# Patient Record
Sex: Male | Born: 1935 | Race: White | Hispanic: No | Marital: Married | Smoking: Never smoker
Health system: Southern US, Community
[De-identification: ages and names within clinical notes are randomized; demographics above are authoritative.]

## PROBLEM LIST (undated history)

## (undated) ENCOUNTER — Inpatient Hospital Stay: Admission: EM | Payer: Self-pay | Source: Home / Self Care

## (undated) DIAGNOSIS — R011 Cardiac murmur, unspecified: Secondary | ICD-10-CM

## (undated) DIAGNOSIS — D689 Coagulation defect, unspecified: Secondary | ICD-10-CM

## (undated) DIAGNOSIS — C4490 Unspecified malignant neoplasm of skin, unspecified: Secondary | ICD-10-CM

## (undated) DIAGNOSIS — N189 Chronic kidney disease, unspecified: Secondary | ICD-10-CM

## (undated) DIAGNOSIS — F0390 Unspecified dementia without behavioral disturbance: Secondary | ICD-10-CM

## (undated) HISTORY — DX: Unspecified malignant neoplasm of skin, unspecified: C44.90

## (undated) HISTORY — DX: Chronic kidney disease, unspecified: N18.9

## (undated) HISTORY — PX: EYE SURGERY: SHX253

## (undated) HISTORY — PX: MELANOMA EXCISION: SHX5266

## (undated) HISTORY — PX: KNEE SURGERY: SHX244

## (undated) HISTORY — DX: Cardiac murmur, unspecified: R01.1

## (undated) HISTORY — DX: Coagulation defect, unspecified: D68.9

---

## 2010-06-14 ENCOUNTER — Ambulatory Visit: Payer: Self-pay | Admitting: Cardiovascular Disease

## 2010-06-14 ENCOUNTER — Inpatient Hospital Stay: Payer: Self-pay | Admitting: Internal Medicine

## 2010-06-30 ENCOUNTER — Ambulatory Visit: Payer: Self-pay | Admitting: Internal Medicine

## 2010-07-28 ENCOUNTER — Ambulatory Visit: Payer: Self-pay | Admitting: Internal Medicine

## 2010-07-30 LAB — CEA: CEA: 2.1 ng/mL (ref 0.0–4.7)

## 2010-07-30 LAB — PSA

## 2010-07-31 ENCOUNTER — Ambulatory Visit: Payer: Self-pay | Admitting: Internal Medicine

## 2010-07-31 LAB — PROT IMMUNOELECTROPHORES(ARMC)

## 2010-08-31 ENCOUNTER — Ambulatory Visit: Payer: Self-pay | Admitting: Internal Medicine

## 2010-09-07 ENCOUNTER — Ambulatory Visit: Payer: Self-pay | Admitting: Family Medicine

## 2010-09-30 ENCOUNTER — Ambulatory Visit: Payer: Self-pay | Admitting: Internal Medicine

## 2010-10-31 ENCOUNTER — Ambulatory Visit: Payer: Self-pay | Admitting: Internal Medicine

## 2010-11-30 ENCOUNTER — Ambulatory Visit: Payer: Self-pay | Admitting: Internal Medicine

## 2010-12-26 ENCOUNTER — Ambulatory Visit: Payer: Self-pay | Admitting: Gastroenterology

## 2010-12-31 ENCOUNTER — Ambulatory Visit: Payer: Self-pay | Admitting: Internal Medicine

## 2011-01-31 ENCOUNTER — Ambulatory Visit: Payer: Self-pay | Admitting: Internal Medicine

## 2011-04-12 ENCOUNTER — Ambulatory Visit: Payer: Self-pay | Admitting: Internal Medicine

## 2011-04-28 ENCOUNTER — Emergency Department: Payer: Self-pay | Admitting: Internal Medicine

## 2011-05-03 ENCOUNTER — Ambulatory Visit: Payer: Self-pay | Admitting: Internal Medicine

## 2011-05-25 ENCOUNTER — Encounter: Payer: Self-pay | Admitting: Cardiothoracic Surgery

## 2011-06-01 ENCOUNTER — Encounter: Payer: Self-pay | Admitting: Cardiothoracic Surgery

## 2011-06-01 ENCOUNTER — Ambulatory Visit: Payer: Self-pay | Admitting: Internal Medicine

## 2011-07-01 ENCOUNTER — Ambulatory Visit: Payer: Self-pay | Admitting: Internal Medicine

## 2011-07-01 ENCOUNTER — Encounter: Payer: Self-pay | Admitting: Cardiothoracic Surgery

## 2011-08-01 ENCOUNTER — Ambulatory Visit: Payer: Self-pay | Admitting: Internal Medicine

## 2011-08-01 ENCOUNTER — Encounter: Payer: Self-pay | Admitting: Cardiothoracic Surgery

## 2011-08-01 ENCOUNTER — Encounter: Payer: Self-pay | Admitting: Nurse Practitioner

## 2011-09-01 ENCOUNTER — Ambulatory Visit: Payer: Self-pay | Admitting: Internal Medicine

## 2011-09-01 ENCOUNTER — Encounter: Payer: Self-pay | Admitting: Cardiothoracic Surgery

## 2011-09-01 ENCOUNTER — Encounter: Payer: Self-pay | Admitting: Nurse Practitioner

## 2011-09-19 ENCOUNTER — Ambulatory Visit: Payer: Self-pay | Admitting: Otolaryngology

## 2011-10-01 ENCOUNTER — Ambulatory Visit: Payer: Self-pay | Admitting: Internal Medicine

## 2011-11-01 ENCOUNTER — Ambulatory Visit: Payer: Self-pay | Admitting: Internal Medicine

## 2011-12-01 ENCOUNTER — Ambulatory Visit: Payer: Self-pay | Admitting: Internal Medicine

## 2012-01-01 ENCOUNTER — Ambulatory Visit: Payer: Self-pay | Admitting: Internal Medicine

## 2012-04-07 ENCOUNTER — Ambulatory Visit: Payer: Self-pay | Admitting: Internal Medicine

## 2012-04-07 LAB — PROTIME-INR
INR: 2.7
Prothrombin Time: 28.6 secs — ABNORMAL HIGH (ref 11.5–14.7)

## 2012-04-14 LAB — PROTIME-INR
INR: 2.2
Prothrombin Time: 24.9 secs — ABNORMAL HIGH (ref 11.5–14.7)

## 2012-04-30 ENCOUNTER — Ambulatory Visit: Payer: Self-pay | Admitting: Internal Medicine

## 2012-04-30 LAB — CBC CANCER CENTER
Basophil %: 0.5 %
Eosinophil #: 0.1 x10 3/mm (ref 0.0–0.7)
Lymphocyte #: 1.4 x10 3/mm (ref 1.0–3.6)
MCV: 98 fL (ref 80–100)
Monocyte %: 10.8 %
Neutrophil #: 4.3 x10 3/mm (ref 1.4–6.5)
Neutrophil %: 65.4 %
Platelet: 192 x10 3/mm (ref 150–440)
RBC: 4.17 10*6/uL — ABNORMAL LOW (ref 4.40–5.90)
RDW: 13.7 % (ref 11.5–14.5)

## 2012-05-05 LAB — PROTIME-INR
INR: 2.3
Prothrombin Time: 25.3 secs — ABNORMAL HIGH (ref 11.5–14.7)

## 2012-05-14 LAB — CBC CANCER CENTER
Basophil #: 0 x10 3/mm (ref 0.0–0.1)
Eosinophil #: 0.1 x10 3/mm (ref 0.0–0.7)
Eosinophil %: 1.1 %
MCH: 32.7 pg (ref 26.0–34.0)
MCHC: 33.7 g/dL (ref 32.0–36.0)
MCV: 97 fL (ref 80–100)
Neutrophil #: 4.8 x10 3/mm (ref 1.4–6.5)
RBC: 4.38 10*6/uL — ABNORMAL LOW (ref 4.40–5.90)
RDW: 13.9 % (ref 11.5–14.5)
WBC: 7.2 x10 3/mm (ref 3.8–10.6)

## 2012-05-21 LAB — CBC CANCER CENTER
Basophil %: 0.7 %
Eosinophil #: 0.2 x10 3/mm (ref 0.0–0.7)
Eosinophil %: 2.7 %
HCT: 42.6 % (ref 40.0–52.0)
Lymphocyte #: 1.8 x10 3/mm (ref 1.0–3.6)
Lymphocyte %: 29.4 %
MCHC: 33.2 g/dL (ref 32.0–36.0)
MCV: 97 fL (ref 80–100)
Neutrophil %: 56.9 %
RBC: 4.38 10*6/uL — ABNORMAL LOW (ref 4.40–5.90)
RDW: 13.7 % (ref 11.5–14.5)

## 2012-05-31 ENCOUNTER — Ambulatory Visit: Payer: Self-pay | Admitting: Internal Medicine

## 2012-06-02 LAB — PROTIME-INR: INR: 1.9

## 2012-06-30 ENCOUNTER — Ambulatory Visit: Payer: Self-pay | Admitting: Internal Medicine

## 2012-06-30 LAB — PROTIME-INR
INR: 2.4
Prothrombin Time: 26.2 secs — ABNORMAL HIGH (ref 11.5–14.7)

## 2012-07-31 ENCOUNTER — Ambulatory Visit: Payer: Self-pay | Admitting: Internal Medicine

## 2012-08-01 LAB — PROTIME-INR: INR: 2

## 2012-08-31 ENCOUNTER — Ambulatory Visit: Payer: Self-pay | Admitting: Internal Medicine

## 2012-09-05 LAB — PROTIME-INR: Prothrombin Time: 25.3 secs — ABNORMAL HIGH (ref 11.5–14.7)

## 2012-09-30 ENCOUNTER — Ambulatory Visit: Payer: Self-pay | Admitting: Internal Medicine

## 2012-10-06 LAB — PROTIME-INR: Prothrombin Time: 23.3 secs — ABNORMAL HIGH (ref 11.5–14.7)

## 2012-10-31 ENCOUNTER — Ambulatory Visit: Payer: Self-pay | Admitting: Internal Medicine

## 2012-11-03 LAB — PROTIME-INR: INR: 2.1

## 2012-11-30 ENCOUNTER — Ambulatory Visit: Payer: Self-pay | Admitting: Internal Medicine

## 2012-12-01 LAB — PROTIME-INR: INR: 2.5

## 2012-12-29 LAB — PROTIME-INR: INR: 2.6

## 2012-12-31 ENCOUNTER — Ambulatory Visit: Payer: Self-pay | Admitting: Internal Medicine

## 2013-03-31 ENCOUNTER — Ambulatory Visit: Payer: Self-pay | Admitting: Internal Medicine

## 2013-04-16 LAB — PROTIME-INR
INR: 2
Prothrombin Time: 22.2 secs — ABNORMAL HIGH (ref 11.5–14.7)

## 2013-04-24 LAB — PROTIME-INR: INR: 2

## 2013-04-30 ENCOUNTER — Ambulatory Visit: Payer: Self-pay | Admitting: Internal Medicine

## 2013-05-31 ENCOUNTER — Ambulatory Visit: Payer: Self-pay | Admitting: Internal Medicine

## 2013-06-19 LAB — PROTIME-INR: Prothrombin Time: 28.1 secs — ABNORMAL HIGH (ref 11.5–14.7)

## 2013-06-30 ENCOUNTER — Ambulatory Visit: Payer: Self-pay | Admitting: Internal Medicine

## 2013-07-10 LAB — PROTIME-INR
INR: 2.6
Prothrombin Time: 27.5 secs — ABNORMAL HIGH (ref 11.5–14.7)

## 2013-07-31 ENCOUNTER — Ambulatory Visit: Payer: Self-pay | Admitting: Internal Medicine

## 2013-08-10 LAB — PROTIME-INR: Prothrombin Time: 25.6 secs — ABNORMAL HIGH (ref 11.5–14.7)

## 2013-08-31 ENCOUNTER — Ambulatory Visit: Payer: Self-pay | Admitting: Internal Medicine

## 2013-08-31 ENCOUNTER — Ambulatory Visit: Payer: Self-pay | Admitting: Oncology

## 2013-09-16 LAB — PROTIME-INR: Prothrombin Time: 26.7 secs — ABNORMAL HIGH (ref 11.5–14.7)

## 2013-09-30 ENCOUNTER — Ambulatory Visit: Payer: Self-pay | Admitting: Oncology

## 2013-09-30 ENCOUNTER — Ambulatory Visit: Payer: Self-pay | Admitting: Internal Medicine

## 2013-10-14 LAB — PROTIME-INR
INR: 2.4
Prothrombin Time: 25.3 secs — ABNORMAL HIGH (ref 11.5–14.7)

## 2013-10-31 ENCOUNTER — Ambulatory Visit: Payer: Self-pay | Admitting: Internal Medicine

## 2013-10-31 ENCOUNTER — Ambulatory Visit: Payer: Self-pay | Admitting: Family Medicine

## 2013-11-18 LAB — PROTIME-INR: INR: 3

## 2013-11-23 LAB — PROTIME-INR: Prothrombin Time: 28.2 secs — ABNORMAL HIGH (ref 11.5–14.7)

## 2013-11-30 ENCOUNTER — Ambulatory Visit: Payer: Self-pay | Admitting: Internal Medicine

## 2013-12-16 LAB — PROTIME-INR
INR: 2.9
Prothrombin Time: 29.1 s — ABNORMAL HIGH

## 2013-12-31 ENCOUNTER — Ambulatory Visit: Payer: Self-pay | Admitting: Family Medicine

## 2013-12-31 ENCOUNTER — Ambulatory Visit: Payer: Self-pay | Admitting: Internal Medicine

## 2014-04-14 ENCOUNTER — Ambulatory Visit: Payer: Self-pay | Admitting: Internal Medicine

## 2014-04-14 LAB — PROTIME-INR
INR: 2.6
PROTHROMBIN TIME: 26.8 s — AB (ref 11.5–14.7)

## 2014-04-30 ENCOUNTER — Ambulatory Visit: Payer: Self-pay | Admitting: Internal Medicine

## 2014-05-01 ENCOUNTER — Observation Stay: Payer: Self-pay | Admitting: Internal Medicine

## 2014-05-01 ENCOUNTER — Ambulatory Visit: Payer: Self-pay | Admitting: Student

## 2014-05-01 LAB — APTT: ACTIVATED PTT: 32 s (ref 23.6–35.9)

## 2014-05-01 LAB — BASIC METABOLIC PANEL
Anion Gap: 9 (ref 7–16)
BUN: 21 mg/dL — ABNORMAL HIGH (ref 7–18)
CALCIUM: 9.2 mg/dL (ref 8.5–10.1)
Chloride: 110 mmol/L — ABNORMAL HIGH (ref 98–107)
Co2: 23 mmol/L (ref 21–32)
Creatinine: 2.17 mg/dL — ABNORMAL HIGH (ref 0.60–1.30)
EGFR (African American): 32 — ABNORMAL LOW
EGFR (Non-African Amer.): 28 — ABNORMAL LOW
Glucose: 138 mg/dL — ABNORMAL HIGH (ref 65–99)
OSMOLALITY: 288 (ref 275–301)
Potassium: 4.1 mmol/L (ref 3.5–5.1)
Sodium: 142 mmol/L (ref 136–145)

## 2014-05-01 LAB — CBC
HCT: 40.6 % (ref 40.0–52.0)
HGB: 13.3 g/dL (ref 13.0–18.0)
MCH: 32.1 pg (ref 26.0–34.0)
MCHC: 32.7 g/dL (ref 32.0–36.0)
MCV: 98 fL (ref 80–100)
Platelet: 222 10*3/uL (ref 150–440)
RBC: 4.13 10*6/uL — ABNORMAL LOW (ref 4.40–5.90)
RDW: 14.4 % (ref 11.5–14.5)
WBC: 12.3 10*3/uL — ABNORMAL HIGH (ref 3.8–10.6)

## 2014-05-01 LAB — PROTIME-INR
INR: 2.3
PROTHROMBIN TIME: 24.3 s — AB (ref 11.5–14.7)

## 2014-05-02 LAB — BASIC METABOLIC PANEL
ANION GAP: 6 — AB (ref 7–16)
BUN: 22 mg/dL — ABNORMAL HIGH (ref 7–18)
CALCIUM: 8.3 mg/dL — AB (ref 8.5–10.1)
CHLORIDE: 112 mmol/L — AB (ref 98–107)
CO2: 23 mmol/L (ref 21–32)
Creatinine: 1.78 mg/dL — ABNORMAL HIGH (ref 0.60–1.30)
EGFR (African American): 41 — ABNORMAL LOW
GFR CALC NON AF AMER: 35 — AB
Glucose: 140 mg/dL — ABNORMAL HIGH (ref 65–99)
OSMOLALITY: 287 (ref 275–301)
Potassium: 4.9 mmol/L (ref 3.5–5.1)
Sodium: 141 mmol/L (ref 136–145)

## 2014-05-02 LAB — CBC WITH DIFFERENTIAL/PLATELET
BASOS ABS: 0 10*3/uL (ref 0.0–0.1)
Basophil %: 0.2 %
EOS PCT: 0.1 %
Eosinophil #: 0 10*3/uL (ref 0.0–0.7)
HCT: 32.7 % — AB (ref 40.0–52.0)
HGB: 10.8 g/dL — AB (ref 13.0–18.0)
LYMPHS ABS: 1.1 10*3/uL (ref 1.0–3.6)
LYMPHS PCT: 11.6 %
MCH: 32.3 pg (ref 26.0–34.0)
MCHC: 33 g/dL (ref 32.0–36.0)
MCV: 98 fL (ref 80–100)
Monocyte #: 0.9 x10 3/mm (ref 0.2–1.0)
Monocyte %: 9.5 %
NEUTROS PCT: 78.6 %
Neutrophil #: 7.4 10*3/uL — ABNORMAL HIGH (ref 1.4–6.5)
Platelet: 175 10*3/uL (ref 150–440)
RBC: 3.33 10*6/uL — ABNORMAL LOW (ref 4.40–5.90)
RDW: 14.6 % — ABNORMAL HIGH (ref 11.5–14.5)
WBC: 9.4 10*3/uL (ref 3.8–10.6)

## 2014-05-02 LAB — PROTIME-INR
INR: 2.1
INR: 1.4
PROTHROMBIN TIME: 22.7 s — AB (ref 11.5–14.7)
Prothrombin Time: 17 secs — ABNORMAL HIGH (ref 11.5–14.7)

## 2014-05-02 LAB — CK-MB
CK-MB: 1.7 ng/mL (ref 0.5–3.6)
CK-MB: 1.1 ng/mL (ref 0.5–3.6)
CK-MB: 1 ng/mL (ref 0.5–3.6)
CK-MB: 1.3 ng/mL (ref 0.5–3.6)

## 2014-05-02 LAB — TROPONIN I
Troponin-I: <0.02 ng/mL
Troponin-I: <0.02 ng/mL
Troponin-I: <0.02 ng/mL

## 2014-05-03 LAB — BASIC METABOLIC PANEL
Anion Gap: 3 — ABNORMAL LOW (ref 7–16)
BUN: 15 mg/dL (ref 7–18)
CHLORIDE: 115 mmol/L — AB (ref 98–107)
CO2: 26 mmol/L (ref 21–32)
Calcium, Total: 7.8 mg/dL — ABNORMAL LOW (ref 8.5–10.1)
Creatinine: 1.71 mg/dL — ABNORMAL HIGH (ref 0.60–1.30)
EGFR (Non-African Amer.): 37 — ABNORMAL LOW
GFR CALC AF AMER: 43 — AB
Glucose: 96 mg/dL (ref 65–99)
OSMOLALITY: 288 (ref 275–301)
POTASSIUM: 4.2 mmol/L (ref 3.5–5.1)
SODIUM: 144 mmol/L (ref 136–145)

## 2014-05-03 LAB — CBC WITH DIFFERENTIAL/PLATELET
Basophil #: 0.1 10*3/uL (ref 0.0–0.1)
Basophil %: 0.7 %
EOS ABS: 0.1 10*3/uL (ref 0.0–0.7)
Eosinophil %: 1.2 %
HCT: 27.2 % — ABNORMAL LOW (ref 40.0–52.0)
HGB: 9 g/dL — ABNORMAL LOW (ref 13.0–18.0)
LYMPHS ABS: 1.3 10*3/uL (ref 1.0–3.6)
Lymphocyte %: 15.3 %
MCH: 32.5 pg (ref 26.0–34.0)
MCHC: 33.1 g/dL (ref 32.0–36.0)
MCV: 98 fL (ref 80–100)
Monocyte #: 1 x10 3/mm (ref 0.2–1.0)
Monocyte %: 11.7 %
NEUTROS ABS: 6.3 10*3/uL (ref 1.4–6.5)
Neutrophil %: 71.1 %
PLATELETS: 147 10*3/uL — AB (ref 150–440)
RBC: 2.77 10*6/uL — ABNORMAL LOW (ref 4.40–5.90)
RDW: 14.7 % — ABNORMAL HIGH (ref 11.5–14.5)
WBC: 8.8 10*3/uL (ref 3.8–10.6)

## 2014-05-12 LAB — CANCER CENTER HEMOGLOBIN: HGB: 10.5 g/dL — AB (ref 13.0–18.0)

## 2014-05-12 LAB — CANCER CTR PLATELET CT: PLATELETS: 364 x10 3/mm (ref 150–440)

## 2014-05-13 DIAGNOSIS — S8010XA Contusion of unspecified lower leg, initial encounter: Secondary | ICD-10-CM | POA: Insufficient documentation

## 2014-05-21 LAB — PROTIME-INR
INR: 1.6
PROTHROMBIN TIME: 18.3 s — AB (ref 11.5–14.7)

## 2014-05-21 LAB — CANCER CTR PLATELET CT: PLATELETS: 425 x10 3/mm (ref 150–440)

## 2014-05-21 LAB — CANCER CENTER HEMOGLOBIN: HGB: 12.1 g/dL — ABNORMAL LOW (ref 13.0–18.0)

## 2014-05-25 LAB — PROTIME-INR
INR: 2.2
PROTHROMBIN TIME: 23.5 s — AB (ref 11.5–14.7)

## 2014-05-25 LAB — CANCER CTR PLATELET CT: Platelet: 428 x10 3/mm (ref 150–440)

## 2014-05-26 DIAGNOSIS — T148XXA Other injury of unspecified body region, initial encounter: Secondary | ICD-10-CM | POA: Insufficient documentation

## 2014-05-31 ENCOUNTER — Ambulatory Visit: Payer: Self-pay | Admitting: Internal Medicine

## 2014-05-31 LAB — CANCER CTR PLATELET CT: Platelet: 392 x10 3/mm (ref 150–440)

## 2014-05-31 LAB — PROTIME-INR
INR: 2.4
PROTHROMBIN TIME: 25.6 s — AB (ref 11.5–14.7)

## 2014-06-03 DIAGNOSIS — T8149XA Infection following a procedure, other surgical site, initial encounter: Secondary | ICD-10-CM | POA: Insufficient documentation

## 2014-06-03 DIAGNOSIS — S8000XA Contusion of unspecified knee, initial encounter: Secondary | ICD-10-CM | POA: Insufficient documentation

## 2014-06-07 LAB — PROTIME-INR
INR: 3
Prothrombin Time: 30.6 secs — ABNORMAL HIGH (ref 11.5–14.7)

## 2014-06-10 LAB — PROTIME-INR
INR: 2.8
PROTHROMBIN TIME: 29.1 s — AB (ref 11.5–14.7)

## 2014-06-14 ENCOUNTER — Encounter: Payer: Self-pay | Admitting: Surgery

## 2014-06-24 LAB — PROTIME-INR
INR: 3
Prothrombin Time: 30.1 secs — ABNORMAL HIGH (ref 11.5–14.7)

## 2014-06-30 ENCOUNTER — Ambulatory Visit: Payer: Self-pay | Admitting: Internal Medicine

## 2014-06-30 ENCOUNTER — Encounter: Payer: Self-pay | Admitting: Surgery

## 2014-07-22 LAB — PROTIME-INR
INR: 2.6
Prothrombin Time: 27.3 secs — ABNORMAL HIGH (ref 11.5–14.7)

## 2014-07-31 ENCOUNTER — Encounter: Payer: Self-pay | Admitting: Surgery

## 2014-07-31 ENCOUNTER — Ambulatory Visit: Payer: Self-pay | Admitting: Internal Medicine

## 2014-08-05 LAB — PROTIME-INR
INR: 2.3
Prothrombin Time: 24.6 secs — ABNORMAL HIGH (ref 11.5–14.7)

## 2014-08-05 LAB — WOUND AEROBIC CULTURE

## 2014-08-19 LAB — PROTIME-INR
INR: 2.5
Prothrombin Time: 26.2 secs — ABNORMAL HIGH (ref 11.5–14.7)

## 2014-08-31 ENCOUNTER — Encounter: Payer: Self-pay | Admitting: Surgery

## 2014-08-31 ENCOUNTER — Ambulatory Visit: Payer: Self-pay | Admitting: Internal Medicine

## 2014-09-16 LAB — PROTIME-INR
INR: 2.3
Prothrombin Time: 24.5 secs — ABNORMAL HIGH (ref 11.5–14.7)

## 2014-09-30 ENCOUNTER — Ambulatory Visit: Payer: Self-pay | Admitting: Internal Medicine

## 2014-10-14 LAB — PROTIME-INR
INR: 2.5
PROTHROMBIN TIME: 26.2 s — AB (ref 11.5–14.7)

## 2014-10-31 ENCOUNTER — Ambulatory Visit: Payer: Self-pay | Admitting: Internal Medicine

## 2014-11-11 LAB — PROTIME-INR
INR: 2
Prothrombin Time: 21.9 secs — ABNORMAL HIGH (ref 11.5–14.7)

## 2014-11-30 ENCOUNTER — Ambulatory Visit: Payer: Self-pay | Admitting: Internal Medicine

## 2014-12-09 LAB — PROTIME-INR
INR: 2.2
PROTHROMBIN TIME: 23.8 s — AB (ref 11.5–14.7)

## 2014-12-29 LAB — PROTIME-INR
INR: 2.3
Prothrombin Time: 24.4 secs — ABNORMAL HIGH (ref 11.5–14.7)

## 2014-12-31 ENCOUNTER — Ambulatory Visit: Payer: Self-pay | Admitting: Internal Medicine

## 2015-04-23 NOTE — Consult Note (Signed)
Admit Diagnosis:   HEMATOMA OF LEG: Onset Date: 01-May-2014, Status: Active, Description: HEMATOMA OF LEG    denies:   Home Medications: Medication Instructions Status  Coumadin 2.5 mg oral tablet 3 tab(s) orally once a day, #300 Active  amlodipine 10 mg oral tablet 1 tab(s) orally once a day 5 PM Active  enalapril 20 mg oral tablet 1 tab(s) orally once a day 5 PM Active  grape seed supplement 1 daily AM Active  Vitamin B12 1500 mcg 1 tab(s) sublingual every other day Active  Mucinex Children's Cold, Cough & Sore Throat 325 mg-10 mg-200 mg-5 mg/10 mL oral liquid  orally , As Needed for cough and congestion Active   Lab Results: Routine BB:  02-May-15 20:50   ABO Group + Rh Type O Positive  Antibody Screen NEGATIVE (Result(s) reported on 01 May 2014 at 09:53PM.)  Routine Chem:  02-May-15 20:50   Glucose, Serum  138  BUN  21  Creatinine (comp)  2.17  Sodium, Serum 142  Potassium, Serum 4.1  Chloride, Serum  110  CO2, Serum 23  Calcium (Total), Serum 9.2  Anion Gap 9  Osmolality (calc) 288  eGFR (African American)  32  eGFR (Non-African American)  28 (eGFR values <39m/min/1.73 m2 may be an indication of chronic kidney disease (CKD). Calculated eGFR is useful in patients with stable renal function. The eGFR calculation will not be reliable in acutely ill patients when serum creatinine is changing rapidly. It is not useful in  patients on dialysis. The eGFR calculation may not be applicable to patients at the low and high extremes of body sizes, pregnant women, and vegetarians.)  Routine Coag:  02-May-15 20:50   Prothrombin  24.3  INR 2.3 (INR reference interval applies to patients on anticoagulant therapy. A single INR therapeutic range for coumarins is not optimal for all indications; however, the suggested range for most indications is 2.0 - 3.0. Exceptions to the INR Reference Range may include: Prosthetic heart valves, acute myocardial infarction, prevention of  myocardial infarction, and combinations of aspirin and anticoagulant. The need for a higher or lower target INR must be assessed individually. Reference: The Pharmacology and Management of the Vitamin K  antagonists: the seventh ACCP Conference on Antithrombotic and Thrombolytic Therapy. CHTDSK.8768Sept:126 (3suppl): [id number] A HCT value >55% may artifactually increase the PT.  In one study,  the increase was an average of 25%. Reference:  "Effect on Routine and Special Coagulation Testing Values of Citrate Anticoagulant Adjustment in Patients with High HCT Values." American Journal of Clinical Pathology 2006;126:400-405.)  Activated PTT (APTT) 32.0 (A HCT value >55% may artifactually increase the APTT. In one study, the increase was an average of 19%. Reference: "Effect on Routine and Special Coagulation Testing Values of Citrate Anticoagulant Adjustment in Patients with High HCT Values." American Journal of Clinical Pathology 2006;126:400-405.)  Routine Hem:  02-May-15 20:50   WBC (CBC)  12.3  RBC (CBC)  4.13  Hemoglobin (CBC) 13.3  Hematocrit (CBC) 40.6  Platelet Count (CBC) 222 (Result(s) reported on 01 May 2014 at 09:03PM.)  MCV 98  MCH 32.1  MCHC 32.7  RDW 14.4   Radiology Results:  Radiology Results: XRay:    02-May-15 20:03, Femur Right  Femur Right  REASON FOR EXAM:    sliding into base  COMMENTS:       PROCEDURE: DXR - DXR FEMUR RIGHT  - May 01 2014  8:03PM     CLINICAL DATA:  Pain and swelling.    EXAM:  RIGHT FEMUR - 2 VIEW    COMPARISON:  None.    FINDINGS:  Diffuse osteopenia and degenerative change. No acute bony or joint  abnormality. Peripheral vascular disease .   IMPRESSION:  1.  No acute bony abnormality.    2. Peripheral vascular disease.      Electronically Signed    By: Marcello Moores  Register    On: 05/01/2014 20:14         Verified By: Osa Craver, M.D., MD    [phone] 20:03, Knee Right Complete  Knee Right Complete  REASON  FOR EXAM:    sliding into base, pain, swelling  COMMENTS:       PROCEDURE: DXR - DXR KNEE RT COMP WITH OBLIQUES  - May 01 2014  8:03PM     CLINICAL DATA:  Pain.  Injury.    EXAM:  RIGHT KNEE - COMPLETE 4+ VIEW    COMPARISON:  None.    FINDINGS:  Diffuse soft tissue swelling is present. Prominent prepatellar edema  noted. Prominent edema noted of the quadriceps musculature and  patellar tendon. Soft tissue injury could be present and MRI can be  obtained for further evaluation. Tiny bony densities noted in the in  joint space, loose bodies may be present. Degenerative change  present. No prominent fracture noted. Vascular calcification.     IMPRESSION:  1. Possible quadriceps/ patellar tendon injury. MRI should be  considered for further evaluation .    2. Tricompartment degenerative change. Loose bodies may be present.    3.  Peripheral vascular disease.      Electronically Signed    By: Marcello Moores  Register    On: 05/01/2014 20:10         Verified By: Osa Craver, M.D., MD  LabUnknown:    02-May-15 20:03, Femur Right  PACS Image    02-May-15 20:03, Knee Right Complete  PACS Image    02-May-15 21:22, CT Head Without Contrast  PACS Image    02-May-15 21:30, CT Femur/Thigh Right Without Contrast  PACS Image  CT:    02-May-15 21:22, CT Head Without Contrast  CT Head Without Contrast  REASON FOR EXAM:    fall, +coumadin  COMMENTS:       PROCEDURE: CT  - CT HEAD WITHOUT CONTRAST  - May 01 2014  9:22PM     CLINICAL DATA:  Fall, hematoma to right eye, on Coumadin    EXAM:  CT HEAD WITHOUT CONTRAST    TECHNIQUE:  Contiguous axial images were obtained from the base of the skull  through the vertex without intravenous contrast.    COMPARISON:  None.  FINDINGS:  No evidence of parenchymal hemorrhage or extra-axial fluid  collection. No mass lesion, mass effect, or midline shift.    No CT evidence of acute infarction.    Subcortical white matter and  periventricular small vessel ischemic  changes.    Mild age related atrophy.  No ventriculomegaly.    The visualized paranasal sinuses are essentially clear. The mastoid  air cells areunopacified.    No evidence of calvarial fracture.   IMPRESSION:  No evidence of acute intracranial abnormality.    Age related atrophy with mild small vessel ischemic changes.      Electronically Signed    By: Julian Hy M.D.    On: 05/01/2014 21:24         Verified By: Julian Hy, M.D.,    02-May-15 21:30, CT Femur/Thigh Right Without Contrast  CT  Femur/Thigh Right Without Contrast  REASON FOR EXAM:    severe swelling s/p fall, on coumadin  COMMENTS:       PROCEDURE: CT  - CT FEMUR /THIGH RIGHT WO  - May 01 2014  9:30PM     CLINICAL DATA:  Status post fall, on Coumadin, severe swelling    EXAM:  CT OF THE LOWER RIGHT EXTREMITY WITHOUT CONTRAST    TECHNIQUE:  Multidetector CT imaging of the lower right extremity was performed  according to the standard protocol. Multiplanar CT image  reconstructions were also generated.  COMPARISON:  Right knee/fever radiographs dated 05/01/2014    FINDINGS:  Subcutaneous hematoma along the anterior aspect of the distal  thigh/knee, measuring approximately 2.7 x 10.6 x 20.2 cm. On axial  imaging, there is a crescent appearance, and the fluid/hemorrhage is  located between the cutaneous layer and subcutaneous tissues (series  8/ image 125). Hematoma proximally extends to the level of the mid  tibial shaft (series 605/ image 36) and distally extends to the  level of the tibial tuberosity.    The appearance/location at least raises the possibility of a closed  degloving injury Ander Gaster Lavallee lesion).    Underlying quadriceps musculature does not appear involved.  Quadriceps and patellar tendons appear intact by CT.    No suprapatellar knee joint effusion.    No fracture is seen.     IMPRESSION:  Subcutaneous hematoma along the  anterior aspect of the distal  thigh/knee, measuring approximately 2.7 x 10.6 x 20.2 cm, as  described above.    The appearance/location at least raises the possibility of a closed  degloving injury (MorelLavallee lesion).    Underlying quadriceps musculature does not appear involved.  Quadriceps and patellar tendons appear intact by CT.    These results were called by telephone at the time of interpretation  on 05/01/2014 at 9:45 PM to Dr. Lavonia Drafts , who verbally  acknowledged these results.      Electronically Signed    By: Julian Hy M.D.    On: 05/01/2014 21:48         Verified By: Julian Hy, M.D.,    No Known Allergies:    General Aspect 81 y/o white male resting on ED stretcher. No acute distress. Minimal pain at rest.   Present Illness 81 y/o white maole who stumbled and fell while playign softball in a tournament in [place], [place]. Patient noted increased swelling of his thigh and pain with movement. Swelling increased over time. On chronic anticoagulation for DVT, INR goal 2.5-3.   Case History and Physical Exam:  Chief Complaint thigh pain   Past Medical Health Coronary Artery Disease, Hypertension, chronic anticoagulation, h/o DVT   Family History Non-Contributory   HEENT Head atraumatic, hearing intact, EOM intact   Neck/Nodes Supple   Chest/Lungs non-labored breathing, no use of accessory muscles, normal RR   Breasts Not examined   Cardiovascular Normal Sinus Rhythm  good pulses   Abdomen Benign   Genitalia Not examined   Rectal Not examined   Musculoskeletal Firm thigh, mostly anterolaterally above knee. Can extend knee but with pain. EHL/FHL/GCS?TA intact, SILT, some abrasions over patella with bruising, extremity warm and well perfused   Neurological Grossly WNL   Skin Warm  Dry    Impression 81 y/o white male after fall with thigh Rhona Leavens lesion. No evidence of compartment syndrome.   Plan Current INR 2.4,  consider evacuation with US guided drain insertion by radiology. If  hematoma continues to expand, we will consider surgical decompression but INR should be <1.8 for any surgical intervention. Recommend serial H&H and neurovascular exam. WBAT. Rest, elevate, ice, ROM of knee as tolerated. No acute intervention planned. Patient and wife in agreement, all questions answered, imaging reviewed with both.   Electronic Signatures: Harle Battiest (MD)  (Signed [phone] 23:07)  Authored: Health Issues, Significant Events - History, Home Medications, Labs, Radiology Results, Allergies, General Aspect/Present Illness, History and Physical Exam, Impression/Plan   Last Updated: 02-May-15 23:07 by Harle Battiest (MD)

## 2015-04-23 NOTE — Op Note (Signed)
PATIENT NAME:  DEREL, BISIGNANO MR#:  [id number] DATE OF BIRTH:  10-14-1931  DATE OF PROCEDURE:  05/02/2014  INDICATION:  Mr. Zwart is a very pleasant 81 year old patient who was seen in the Emergency Room on May 01, 2014.  He presented there secondary to injury that he sustained during a softball game earlier in the day.  The patient stated that he stumbled and fell while running to the third base.  He reports progressively worsening pain in his right thigh as well as swelling. He is on Coumadin daily for a history of DVT.  INR in the Emergency Room was 2.4.  He was admitted to the hospital for observation.  Overnight, his hemoglobin dropped from 13 to 10.  He also developed blisters on his anterior thigh skin. After discussing the risks and benefits of developing an infection to his persistent hematoma, the decision was made to take the patient to the Operating Room for debridement and irrigation of this hematoma.  On review of images with the patient and wife, the patient did consent to the procedure and all questions were answered.    PREOPERATIVE DIAGNOSIS:  Morel-Lavallee lesion/hematoma right thigh.  POSTOPERATIVE DIAGNOSIS:  Morel-Lavallee lesion/hematoma right thigh.  PROCEDURE:  Irrigation and debridement right thigh hematoma.    SURGEON:  Kharisma Glasner M. Gray Bernhardt, MD, PhD.    ANESTHESIA:  General.  IV FLUIDS AND URINE:  Please see anesthesia records for further details.  ESTIMATED BLOOD LOSS:  50 mL.    IMPLANTS USED:  None.  DRAINS:  A 10-French Jackson-Pratt drain was placed.    COMPLICATIONS:  None.    DESCRIPTION OF THE PROCEDURE:  The patient was brought to the Operating Room and placed supine on the operating table.  The patient had been signed prior to the procedure.  The patient had anesthesia placed by the anesthesiologist.  Prep verification and additional timeout was performed according to institutional guidelines, identifying the correct patient, site and side as well  as procedure.  The patient received antibiotics prior to incision.  The right lower extremity was prepped and draped in the standard fashion.    We then proceeded to make an approximately 2.5 to 3-inch incision on the lateral aspect of his distal thigh.  The incision was taken down through the skin and subcutaneous tissues.  A large amount of hematoma was evacuated.  The dead space was thoroughly irrigated with normal saline delivered via cysto tubing.  A total of 3 liters were used.  Hemostasis was achieved using electrocautery.  A 10-French drain was placed.  The wound was closed in a layered fashion using 2-0 Monocryl plus and 3-0 nylon. Sterile dressing was applied.  Of note, he had abrasions to his anterior knee.  These abrasions as well as the existing skin blisters on the anterior aspect of his thigh were covered with Xeroform followed by 4 x 8, ABDs, and by compressive wrapping with an Ace bandage.  A Polar Ice-care pack was applied.  The patient was then transferred back to the bed and left the Operating Room in stable condition.  All sponge and instrument counts were correct at the end of the case.    POSTOPERATIVE PLANS:  The patient will return to the floor.  He will be weight-bearing as tolerated to his right lower extremity.  He will return to the [facility] for suture removal in approximately 2 weeks.  Drain removal will be determined based on drain output.  Continue compressive wrapping to this thigh as  well as Polar Ice-care.  IV antibiotics for 24 hours.  Will start physical therapy postoperative day 1.      ____________________________ Vergia Alberts. Gray Bernhardt, MD tms:cs D: 05/02/2014 18:29:00 ET T: 05/02/2014 20:39:45 ET JOB#: [id number]  cc: Lanetra Hartley M. Gray Bernhardt, MD, <Dictator> Harle Battiest MD ELECTRONICALLY SIGNED 05/05/2014 8:47

## 2015-04-23 NOTE — Discharge Summary (Signed)
PATIENT NAME:  Gregg Thomas, Gregg Thomas MR#:  [id number] DATE OF BIRTH:  11/18/1931  DATE OF ADMISSION:  05/01/2014 DATE OF DISCHARGE:  05/03/2014  ADMISSION DIAGNOSIS: Right leg hematoma.   DISCHARGE DIAGNOSES:  1. Right leg hematoma after a mechanical fall, on chronic anticoagulation.  2. History of pulmonary embolism, on chronic anticoagulation.   CONSULTATIONS: Orthopedics.    PROCEDURES: On 05/02/2014, the patient underwent irrigation and debridement of the right thigh hematoma.   PERTINENT LABORATORIES AT DISCHARGE: White blood cell was 8.8, hemoglobin 9, hematocrit 28, platelets 147. Sodium 144, potassium 4.2, chloride 115, bicarb 26, BUN 15, creatinine 1.71, glucose is 96. INR is 1.4.   HOSPITAL COURSE: This is an 81 year old male who is on chronic anticoagulation for the past 5 years for a DVT that happened about 5 years ago who was playing softball and had a mechanical fall. For further details, please refer to the H and P.  1. Right leg hematoma: Unfortunately, the patient suffered a right leg hematoma after his fall due to the fact that he was on anticoagulation. His CT showed a large hematoma. Orthopedics was consulted. The patient underwent irrigation and debridement on the 3rd of May. He now has a small JP drain placed. Orthopedic surgery did see the patient on the day of discharge and changed the dressing. The patient will follow up in 3 to 5 days with [place], and he was placed on IV antibiotics and will be discharged with p.o. antibiotics. He did receive 1 unit of FFP prior to this I and D.  2. History of DVT 5 years ago. We stopped anticoagulation. He is seeing Dr. Inez Pilgrim. He had a DVT about 5 years ago and does not need any anticoagulation anymore. As per the patient, he does not have a history of any disorders that he knows of like protein C or S deficiency. We did have the patient see Dr. Inez Pilgrim.  3. Hypertension: The patient will continue his outpatient medications. He has  a slight acute kidney injury, but I think this may be around his baseline.  4. Acute kidney injury: Initially likely from dehydration. His creatinine has remained stable. He may resume his outpatient medications.   DISCHARGE MEDICATIONS:  1. Norvasc 10 mg daily.  2. Enalapril 20 mg daily.  3. Grapeseed daily.  4. Vitamin B12 1500 mg every other day.  5. Mucinex Children's Cold as needed.  6. Keflex 500 mg t.i.d. x 10 days.  7. Ferrous sulfate 160 mg daily.  8. Ensure t.i.d.  9. Acetaminophen/oxycodone 325/5 q.6 hours p.r.n. pain, #20.   Discharge home health with physical therapy, nurse and nurse aid.   DISCHARGE FOLLOWUP: The patient will follow up with orthopedics in 3 to 5 days at [facility], and the patient is to follow up with Dr. Jeananne Rama in 1 week. His sutures will be removed in 2 weeks.   TIME SPENT: Approximately 35 minutes. The patient is medically stable for discharge.   ____________________________ Marquise Wicke P. Benjie Karvonen, MD spm:gb D: 05/03/2014 13:53:39 ET T: 05/03/2014 22:16:47 ET JOB#: [id number]  cc: Chucky Homes P. Benjie Karvonen, MD, <Dictator> Guadalupe Maple, MD Laurice Record. Holley Bouche., MD Donell Beers Tranesha Lessner MD ELECTRONICALLY SIGNED 05/04/2014 13:12

## 2015-04-23 NOTE — H&P (Signed)
PATIENT NAME:  Gregg Thomas, Gregg Thomas MR#:  [id number] DATE OF BIRTH:  1931/03/23  DATE OF ADMISSION:  05/01/2014  PRIMARY CARE PHYSICIAN: Dr. Golden Pop.   REFERRING PHYSICIAN: Dr. Lavonia Drafts.   CHIEF COMPLAINT: Right thigh pain.   HISTORY OF PRESENT ILLNESS: Gregg Thomas is an 81 year old pleasant white male with a previous history of DVT, PE about 5 years back and ever since has been chronic anticoagulation with Coumadin. Was playing softball today, tripped and fell down. After the end of the game, the patient was driving back home, started to experience tightness in the right thigh. Concerning this, came to the Emergency Department. Workup in the Emergency Department with a CT of the femur shows hematoma 2.7 x 10.6 x 20 cm subcutaneous along the anterior aspect of the distal thigh.   While being examined by the ER physician, the patient became diaphoretic, felt dizzy, had an urge to have a bowel movement. Denies having any abdominal pain.   PAST MEDICAL HISTORY:  1. History of PE.  2. Renal insufficiency.   ALLERGIES: No known drug allergies.   HOME MEDICATIONS:  1. Vitamin B12 at 1500 mcg sublingual. 2. Mucinex  as needed.  3. Grape seed supplement as needed.  4. Enalapril 20 mg once a day.  5. Coumadin 2.5 mg 3 tablets once a day.  6. Amlodipine 10 mg once a day.   SOCIAL HISTORY: No history of smoking, drinking alcohol, or using illicit drugs. Married, lives with his wife. Independent with ADLs and IADLs.  FAMILY HISTORY: Noncontributory at the age of 78.    REVIEW OF SYSTEMS:   CONSTITUTIONAL: Denies any weakness.  EYES: No change in vision.  ENT: No change in hearing.  RESPIRATORY: No cough, shortness of breath.  CARDIOVASCULAR: No chest pain, palpitations.  GASTROINTESTINAL: No nausea, vomiting, abdominal pain.  GENITOURINARY: No dysuria or hematuria.  SKIN: Has bruising on the right thigh.  HEMATOLOGIC: No easy bruising.  MUSCULOSKELETAL: Has pain in the right  side. NEUROLOGIC: No weakness or numbness in any part of the body.   PHYSICAL EXAMINATION:  GENERAL: This is well-built, well-nourished, age-appropriate male laying down in the bed, not in distress.  VITAL SIGNS: Temperature 98, pulse 67, blood 117/66, respiratory rate of 16, oxygen saturation is 95% on room air.  HEENT: Head normocephalic, atraumatic. No scleral icterus. Conjunctivae normal. Pupils equal and react to light. Extraocular movements are intact. Mucous membranes moist. No pharyngeal erythema.  NECK: Supple. No lymphadenopathy. No JVD, no carotid bruits, no thyromegaly.  CHEST: Has no focal tenderness. LUNGS: Bilaterally clear to auscultation.  HEART: S1, S2 regular. No murmurs are heard.  ABDOMEN: Bowel sounds present. Soft, nontender, nondistended.  EXTREMITIES: Right thigh is extensively swollen tight. Pulses 2+. NEUROLOGIC: The patient is alert, oriented to place, person, and time. Cranial nerves II-XII intact. Motor 5/5 in upper and lower extremities.  ABDOMEN: No hepatosplenomegaly.  SKIN: Has extensive ecchymosis of the right thigh.   LABORATORY DATA:  1. CT femur shows a large hematoma.  2. CT head without contrast: No acute intracranial abnormality.   Troponin less than 0.02, CPK of 1.7, PT 24, INR of 2.3. CMP: BUN 21 and creatinine of 2.17.  glucose of 138.  other values are within normal limits.   CBC is completely within normal limits except for mild elevation of the WBC of 12.3 thousand.   ASSESSMENT AND PLAN: Mr. Vardeman is an 81 year old male who had a mechanical fall, sustained large right thigh hematoma. 1. Right thigh  hematoma seen by vascular surgeon. Will do the drainage the patient develops any compartment syndrome. Discussed with the patient, will reverse the INR.  2. Hypertension. We will hold the amlodipine for now.  3. Keep the patient on deep vein thrombosis prophylaxis with sequential compression devices.   TIME SPENT: 45 minutes.    ____________________________ Gregg Becton, MD pv:lt D: 05/02/2014 01:25:49 ET T: 05/02/2014 03:01:37 ET JOB#: [id number]  cc: Gregg Becton, MD, <Dictator> Guadalupe Maple, MD  Gregg Becton MD ELECTRONICALLY SIGNED 05/03/2014 21:04

## 2015-04-24 NOTE — Consult Note (Signed)
Reason for Visit: This 81 year old Male patient presents to the clinic for initial evaluation of  Multiple skin cancers of .   Referred by Dr. Nadeen Landau.  Diagnosis:   Chief Complaint/Diagnosis 81 year old male with multiple skin cancers of the face with prominent lesions of the left posterior ear right temple and left cheek    Referral Report Clinical notes reviewed    Planned Treatment Regimen Observation    HPI Patient is a 81 year old male followed by Dr. Carlis Abbott for multiple skin cancers with lesions of the left posterior year right temple and left cheek. He also has extensive actinic keratoses of the head and neck region. He is followed by Dr. Cynda Acres has had a pulmonary embolus and DVT in the past on anticoagulant therapy. He initially was scheduled for possible Mohs surgery although as been asked to see me with regard to radiation therapy. He is otherwise doing well at this time. Dr. Cynda Acres is advised a chance to go to his anticoagulant therapy making surgery and difficult. Patient is asked to be consulted by me for his skin cancers.   Past Hx:    denies:    denies:   Past, Family and Social History:   Past Medical History positive    Respiratory pulmonary embolism    Family History noncontributory    Social History noncontributory   Allergies:   No Known Allergies:   Home Meds:  Home Medications:  Coumadin 2.5 mg tablet: 3 tab(s) on tues thurs sat sun and 2 tabs mon wed fri, Active  amlodipine 10 mg oral tablet: 1 tab(s) orally once a day 5 PM, Active  warfarin 5 mg oral tablet: 1 tab(s) orally once a dayMon, Weds AND Fri, Active  warfarin 7.5 mg oral tablet: 1 tab(s) orally once a daySat/Sun/ Tues/Thurs, Active  enalapril 20 mg oral tablet: 1 tab(s) orally once a day 5 PM, Active  grape seed supplement: 1 daily AM, Active  Review of Systems:   General negative    Performance Status (ECOG) 0    Skin see HPI    Breast negative    Ophthalmologic negative     ENMT negative    Respiratory and Thorax negative    Cardiovascular negative    Gastrointestinal negative    Genitourinary negative    Musculoskeletal negative    Neurological negative    Psychiatric negative    Hematology/Lymphatics negative    Endocrine negative    Allergic/Immunologic negative   Nursing Notes:  Nursing Vital Signs and Chemo Nursing Nursing Notes: *CC Vital Signs Flowsheet:   17-Dec-12 09:02   Temp Temperature 96.4   Pulse Pulse 70   Respirations Respirations 20   SBP SBP 144   DBP DBP 80   Current Weight (kg) (kg) 80.6   Height (cm) centimeters 168.5   BSA (m2) 1.9   Physical Exam:  General/Skin/HEENT:   General normal    Eyes normal    ENMT normal    Additional PE Well-developed elderly male in NAD. Multiple areas of actinic keratoses in early solar damage to his scan of the head and neck region. Also has a lesion of his left posterior ear temple and cheek.   Breasts/Resp/CV/GI/GU:   Respiratory and Thorax normal    Cardiovascular normal    Gastrointestinal normal    Genitourinary normal   MS/Neuro/Psych/Lymph:   Musculoskeletal normal    Neurological normal    Lymphatics normal   Other Results:  Radiology Results: CT:    01-Aug-12  12:35, CT Chest Without Contrast   CT Chest Without Contrast    REASON FOR EXAM:    hx pulmonary nodules renal failure compare  to   november  2011 scan  COMMENTS:       PROCEDURE: CT  - CT CHEST WITHOUT CONTRAST  - Aug 01 2011 12:35PM     RESULT:     TECHNIQUE: Noncontrast CT of the chest is compared to the mostrecent   study of 06/14/2010 and to a study of 11/16/2010.    FINDINGS: Lung window images demonstrate stable, subtle nodular densities   in the lateral left lower lobe and in the right lung base paraspinal   region there is some stable linear opacity. Lingular nodular density is   stable on image #34. These areas are less than 3 mm in diameter. No new     areas of nodular  density are seen. Apical fibrotic changes appear stable   bilaterally. The included portions of the thyroid gland and mediastinal   structures are unremarkable. The heart is not enlarged. The upper   abdominal structures included with this exam appear within normal limits.   A small accessory spleen is present on image #56 anterior to the spleen.    IMPRESSION: Stable nodular densities in the chest as described with   presumed stable areas of fibrosis or atelectasis in the right lung base   paraspinal region and apical regions bilaterally.      Thank you for this opportunity to contribute to the care of your patient.           Verified By: Sundra Aland, M.D., MD   Assessment and Plan:   Impression Extensive solar damage to his scan of the head and neck region with discreet skin cancers of the left posterior temporal and cheek.    Plan This time I see no areas that need immediate radiation therapy. Have elected to observe these lesions and have asked to see him back in April for followup. Should they progress he will always offer radiation therapy in a curative intent with electron beam therapy. Patient is fine with waiting and continue to observe these lesions. He will continue close followup care with Dr. Cynda Acres.  I would like to take this opportunity to thank you for allowing me to continue to participate in this patient's care.   Electronic Signatures: Armstead Peaks (MD)  (Signed 15-Jan-13 11:15)  Authored: HPI, Diagnosis, Past Hx, PFSH, Allergies, Home Meds, ROS, Nursing Notes, Physical Exam, Other Results, Encounter Assessment and Plan   Last Updated: 15-Jan-13 11:15 by Armstead Peaks (MD)

## 2015-04-28 ENCOUNTER — Ambulatory Visit
Admit: 2015-04-28 | Disposition: A | Discharge status: Home / Self Care | Payer: Self-pay | Attending: Internal Medicine | Admitting: Internal Medicine

## 2015-04-28 LAB — PROTIME-INR
INR: 2
Prothrombin Time: 22.5 secs — ABNORMAL HIGH

## 2015-05-27 ENCOUNTER — Other Ambulatory Visit: Payer: Self-pay | Admitting: *Deleted

## 2015-05-27 ENCOUNTER — Inpatient Hospital Stay: Payer: Medicare Other | Attending: Family Medicine

## 2015-05-27 DIAGNOSIS — Z86718 Personal history of other venous thrombosis and embolism: Secondary | ICD-10-CM

## 2015-05-27 DIAGNOSIS — I2699 Other pulmonary embolism without acute cor pulmonale: Secondary | ICD-10-CM | POA: Diagnosis not present

## 2015-05-27 DIAGNOSIS — R918 Other nonspecific abnormal finding of lung field: Secondary | ICD-10-CM | POA: Diagnosis not present

## 2015-05-27 LAB — PROTIME-INR
INR: 2.47
Prothrombin Time: 26.9 seconds — ABNORMAL HIGH (ref 11.4–15.0)

## 2015-06-27 ENCOUNTER — Ambulatory Visit: Payer: Self-pay | Admitting: Internal Medicine

## 2015-06-27 ENCOUNTER — Other Ambulatory Visit: Payer: Self-pay

## 2015-07-06 ENCOUNTER — Other Ambulatory Visit: Payer: Self-pay

## 2015-07-06 DIAGNOSIS — I82409 Acute embolism and thrombosis of unspecified deep veins of unspecified lower extremity: Secondary | ICD-10-CM

## 2015-07-08 ENCOUNTER — Inpatient Hospital Stay (HOSPITAL_BASED_OUTPATIENT_CLINIC_OR_DEPARTMENT_OTHER): Payer: Medicare Other | Admitting: Hematology and Oncology

## 2015-07-08 ENCOUNTER — Inpatient Hospital Stay: Payer: Medicare Other | Attending: Hematology and Oncology

## 2015-07-08 ENCOUNTER — Encounter: Payer: Self-pay | Admitting: Hematology and Oncology

## 2015-07-08 ENCOUNTER — Telehealth: Payer: Self-pay

## 2015-07-08 VITALS — BP 159/80 | HR 79 | Temp 97.6°F | Resp 18 | Ht 67.0 in | Wt 177.4 lb

## 2015-07-08 DIAGNOSIS — Z85828 Personal history of other malignant neoplasm of skin: Secondary | ICD-10-CM | POA: Diagnosis not present

## 2015-07-08 DIAGNOSIS — Z86718 Personal history of other venous thrombosis and embolism: Secondary | ICD-10-CM | POA: Insufficient documentation

## 2015-07-08 DIAGNOSIS — Z79899 Other long term (current) drug therapy: Secondary | ICD-10-CM | POA: Diagnosis not present

## 2015-07-08 DIAGNOSIS — I82409 Acute embolism and thrombosis of unspecified deep veins of unspecified lower extremity: Secondary | ICD-10-CM

## 2015-07-08 DIAGNOSIS — I2699 Other pulmonary embolism without acute cor pulmonale: Secondary | ICD-10-CM | POA: Insufficient documentation

## 2015-07-08 DIAGNOSIS — Z86711 Personal history of pulmonary embolism: Secondary | ICD-10-CM | POA: Insufficient documentation

## 2015-07-08 DIAGNOSIS — R918 Other nonspecific abnormal finding of lung field: Secondary | ICD-10-CM | POA: Insufficient documentation

## 2015-07-08 DIAGNOSIS — R011 Cardiac murmur, unspecified: Secondary | ICD-10-CM

## 2015-07-08 DIAGNOSIS — Z7901 Long term (current) use of anticoagulants: Secondary | ICD-10-CM

## 2015-07-08 DIAGNOSIS — I82401 Acute embolism and thrombosis of unspecified deep veins of right lower extremity: Secondary | ICD-10-CM

## 2015-07-08 DIAGNOSIS — I2782 Chronic pulmonary embolism: Secondary | ICD-10-CM

## 2015-07-08 LAB — PROTIME-INR
INR: 2.05
Prothrombin Time: 23.3 seconds — ABNORMAL HIGH (ref 11.4–15.0)

## 2015-07-08 NOTE — Progress Notes (Signed)
[place] [facility] day:  07/08/2015  Chief Complaint: Gregg Thomas is a 81 y.o. male with a history of pulmonary embolism and DVT who is seen for reassessment.  HPI:  The patient is diagnosed with a DVT and pulmonary embolism foll on 06/22/2010.  He had a recent history of a long automobile trip.  He underwent a hypercoagulable workup.  The following studies were negative: Factor V Leiden, prothrombin gene mutation, beta 2 glycoprotein, protein S free and and total, lupus anticoagulant panel, anti-cardiolipin antibodies. Protein C antigen was slightly low at 58%. Anti-ithrombin III was lower limits of normal at 73%.  Additional negative studies included a CEA, PSA, SPEP and ANA.  The patient has subsequently been on Coumadin.  He was seen by Dr Joan Flores at the [facility] coagulation clinic.  His lifetime risk of recurrent clot was felt to be 30%.  Decision was made to hold Coumadin for procedure then restart Coumadin and Lovenox after procedures rather than bridge before and after procedures.  On 05/21/2014 he was hospitalized following a leg injury area .  He developed a thigh hematoma. His anticoagulation was reversed with FFP. He had surgery. Hemoglobin dropped to 10.8.  He continues on Coumadin 5 mg daily except for Fridays (7.5 mg). Total weekly Coumadin dose is 37.5 mg.  He denies any bruising or bleeding.  He has a history of small pulmonary nodules on chest CT.  Nodules have been stable per radiology (2011 to 2013).  He has never smoked,  Symptomatically, he denies any concerns.    Past Medical History  Diagnosis Date  . Heart murmur   . Skin cancer     No past surgical history on file.  Family History  Problem Relation Age of Onset  . Stroke Mother   Father died of a cerebral hemorrhage at age 81.  Social History:  reports that he has never smoked. He has never used smokeless tobacco. He reports that he does not drink alcohol or use illicit  drugs.  He travels to [place], [place], from [place].  He plays Physicist, medical.  The patient is accompanied by his wife, Gregg Thomas, today.  Allergies: No Known Allergies  Current Medications: Current Outpatient Prescriptions  Medication Sig Dispense Refill  . amLODipine (NORVASC) 10 MG tablet Take by mouth.    . Cyanocobalamin (RA VITAMIN B-12 TR) 1000 MCG TBCR Take by mouth.    . enalapril (VASOTEC) 20 MG tablet Take by mouth.    . Grape Seed Extract 50 MG CAPS Take by mouth.    . warfarin (COUMADIN) 2.5 MG tablet Take 2.5 mg by mouth every Friday.    . warfarin (COUMADIN) 5 MG tablet Take 5 mg by mouth daily at 6 PM.     No current facility-administered medications for this visit.    Review of Systems:  GENERAL:  Feels good.  Active.  No fevers, sweats or weight loss. PERFORMANCE STATUS (ECOG):  0 HEENT:  No visual changes, runny nose, sore throat, mouth sores or tenderness. Lungs: No shortness of breath or cough.  No hemoptysis. Cardiac:  No chest pain, palpitations, orthopnea, or PND. GI:  No nausea, vomiting, diarrhea, constipation, melena or hematochezia.  Colonoscopy 2015. GU:  No urgency, frequency, dysuria, or hematuria. Musculoskeletal:  No back pain.  No joint pain.  No muscle tenderness. Extremities:  No pain or swelling. Skin:  No rashes or skin changes. Neuro:  No headache, numbness or weakness, balance or coordination issues. Endocrine:  No diabetes, thyroid issues, hot flashes or night sweats. Psych:  No mood changes, depression or anxiety. Pain:  No focal pain. Review of systems:  All other systems reviewed and found to be negative.   Physical Exam: Blood pressure 159/80, pulse 79, temperature 97.6 F (36.4 C), temperature source Oral, resp. rate 18, height 5\' 7"  (1.702 m), weight 177 lb 5.8 oz (80.45 kg). GENERAL:  Well developed, well nourished, sitting comfortably in the exam room in no acute distress. MENTAL STATUS:  Alert and oriented to person, place and  time. HEAD:  Pearline Cables hair.  Normocephalic, atraumatic, face symmetric, no Cushingoid features. EYES:  Glasses.  Hazel eyes.  Pupils equal round and reactive to light and accomodation.  No conjunctivitis or scleral icterus. ENT:  Oropharynx clear without lesion.  Tongue normal. Mucous membranes moist.  RESPIRATORY:  Clear to auscultation without rales, wheezes or rhonchi. CARDIOVASCULAR:  Regular rate and rhythm without murmur, rub or gallop. ABDOMEN:  Soft, non-tender, with active bowel sounds, and no hepatosplenomegaly.  No masses. SKIN:  Multiple scalp lesions (prominent left near nose and right temple).  No rashes, ulcers or lesions. EXTREMITIES: No edema, no skin discoloration or tenderness.  No palpable cords. LYMPH NODES: No palpable cervical, supraclavicular, axillary or inguinal adenopathy  NEUROLOGICAL: Unremarkable. PSYCH:  Appropriate.   Appointment on 07/08/2015  Component Date Value Ref Range Status  . Prothrombin Time 07/08/2015 23.3* 11.4 - 15.0 seconds Final  . INR 07/08/2015 2.05   Final    Assessment:  Gregg Thomas is a 81 y.o. male with a DVT and pulmonary embolism on 06/22/2010 following a long car ride.  His lifetime risk of recurrent clot is felt to be 30%.  Decision was made to hold Coumadin for procedures then restart Coumadin with a Lovenox bridge.  Hypercoagulable workup revealed the following negative studies: Factor V Leiden, prothrombin gene mutation, beta 2 glycoprotein, protein S free and and total, lupus anticoagulant panel, anti-cardiolipin antibodies. Protein C antigen was slightly low at 58%. Anti-ithrombin III was lower limits of normal at 73%.  Additional negative studies included a CEA, PSA, SPEP and ANA.  He was hospitalized on 05/21/2014 with a thigh hematoma. His anticoagulation was reversed with FFP. He had surgery. Hemoglobin dropped to 10.8.  He is on Coumadin 5 mg daily except for Fridays (7.5 mg). Total weekly Coumadin dose is 37.5 mg.     He has a history of small pulmonary nodules on chest CT.  Nodules have been stable per radiology (2011 to 2013).  He has never smoked,  Symptomatically, he denies any concerns.  He denies any bruising or bleeding.  Exam is unremarkable.  Plan:  1.  Review entire medical history, diagnosis, and management of DVT/PE. Per prior conversations and consultation at [facility], patient needs lifelong anticoagulation given his risk of recurrent clot.  He requires a partial bridge around procedures allowing him to come off Coumadin prior to the procedure, then post procedure instituting Lovenox until his INR is therapeutic. 2.  Continue Coumadin.   3.  RTC monthly for PT/INR. 4.  RTC in 4 months for MD assessment and labs (CBC with diff, PT/INR).   Lequita Asal, MD  07/08/2015, 10:28 AM

## 2015-07-08 NOTE — Telephone Encounter (Signed)
Pt was established pt of Dr. 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

## 2015-08-08 ENCOUNTER — Inpatient Hospital Stay: Payer: Medicare Other | Attending: Hematology and Oncology

## 2015-08-08 DIAGNOSIS — I2699 Other pulmonary embolism without acute cor pulmonale: Secondary | ICD-10-CM

## 2015-08-08 DIAGNOSIS — I82401 Acute embolism and thrombosis of unspecified deep veins of right lower extremity: Secondary | ICD-10-CM

## 2015-08-08 LAB — PROTIME-INR
INR: 2.43
Prothrombin Time: 26.5 seconds — ABNORMAL HIGH (ref 11.4–15.0)

## 2015-08-12 ENCOUNTER — Telehealth: Payer: Self-pay | Admitting: *Deleted

## 2015-08-12 NOTE — Telephone Encounter (Signed)
Called patient and left message that INR was normal. Advised patient to keep next appointment on Sept. 8 @ 3 pm at which time lab will be rechecked.

## 2015-09-08 ENCOUNTER — Inpatient Hospital Stay: Payer: Medicare Other | Attending: Hematology and Oncology

## 2015-09-08 DIAGNOSIS — I2699 Other pulmonary embolism without acute cor pulmonale: Secondary | ICD-10-CM

## 2015-09-08 DIAGNOSIS — Z86718 Personal history of other venous thrombosis and embolism: Secondary | ICD-10-CM | POA: Diagnosis present

## 2015-09-08 DIAGNOSIS — I82401 Acute embolism and thrombosis of unspecified deep veins of right lower extremity: Secondary | ICD-10-CM

## 2015-09-08 LAB — PROTIME-INR
INR: 2.28
Prothrombin Time: 25.3 seconds — ABNORMAL HIGH (ref 11.4–15.0)

## 2015-10-07 ENCOUNTER — Inpatient Hospital Stay: Payer: Medicare Other | Attending: Hematology and Oncology

## 2015-10-07 DIAGNOSIS — I82409 Acute embolism and thrombosis of unspecified deep veins of unspecified lower extremity: Secondary | ICD-10-CM | POA: Diagnosis present

## 2015-10-07 DIAGNOSIS — I82401 Acute embolism and thrombosis of unspecified deep veins of right lower extremity: Secondary | ICD-10-CM

## 2015-10-07 DIAGNOSIS — I2699 Other pulmonary embolism without acute cor pulmonale: Secondary | ICD-10-CM

## 2015-10-07 LAB — PROTIME-INR
INR: 1.91
Prothrombin Time: 22 seconds — ABNORMAL HIGH (ref 11.4–15.0)

## 2015-10-12 ENCOUNTER — Inpatient Hospital Stay: Payer: Medicare Other

## 2015-10-12 ENCOUNTER — Telehealth: Payer: Self-pay

## 2015-10-12 DIAGNOSIS — I2699 Other pulmonary embolism without acute cor pulmonale: Secondary | ICD-10-CM

## 2015-10-12 DIAGNOSIS — I82409 Acute embolism and thrombosis of unspecified deep veins of unspecified lower extremity: Secondary | ICD-10-CM | POA: Diagnosis not present

## 2015-10-12 DIAGNOSIS — I82401 Acute embolism and thrombosis of unspecified deep veins of right lower extremity: Secondary | ICD-10-CM

## 2015-10-12 LAB — PROTIME-INR
INR: 2.18
Prothrombin Time: 24.4 seconds — ABNORMAL HIGH (ref 11.4–15.0)

## 2015-10-12 NOTE — Telephone Encounter (Signed)
Spoke with pt about PT/INR being good and moving to monthly chacks he has appt on 11/8 and verbalized an understanding.

## 2015-10-12 NOTE — Telephone Encounter (Signed)
Called pt to per MD labs to let pt know PT/INR good moving to monthly checks.  No answer, left message for pt to return my phone call.

## 2015-10-14 ENCOUNTER — Other Ambulatory Visit: Payer: Medicare Other

## 2015-11-08 ENCOUNTER — Ambulatory Visit: Payer: Medicare Other | Admitting: Hematology and Oncology

## 2015-11-08 ENCOUNTER — Other Ambulatory Visit: Payer: Medicare Other

## 2015-11-11 ENCOUNTER — Inpatient Hospital Stay: Payer: Medicare Other

## 2015-11-11 ENCOUNTER — Inpatient Hospital Stay: Payer: Medicare Other | Attending: Hematology and Oncology | Admitting: Hematology and Oncology

## 2015-11-11 VITALS — BP 150/79 | HR 73 | Temp 96.0°F | Wt 180.2 lb

## 2015-11-11 DIAGNOSIS — Z86711 Personal history of pulmonary embolism: Secondary | ICD-10-CM

## 2015-11-11 DIAGNOSIS — R011 Cardiac murmur, unspecified: Secondary | ICD-10-CM

## 2015-11-11 DIAGNOSIS — Z7901 Long term (current) use of anticoagulants: Secondary | ICD-10-CM

## 2015-11-11 DIAGNOSIS — Z79899 Other long term (current) drug therapy: Secondary | ICD-10-CM | POA: Diagnosis not present

## 2015-11-11 DIAGNOSIS — Z86718 Personal history of other venous thrombosis and embolism: Secondary | ICD-10-CM

## 2015-11-11 DIAGNOSIS — I2699 Other pulmonary embolism without acute cor pulmonale: Secondary | ICD-10-CM

## 2015-11-11 DIAGNOSIS — I82401 Acute embolism and thrombosis of unspecified deep veins of right lower extremity: Secondary | ICD-10-CM

## 2015-11-11 DIAGNOSIS — Z85828 Personal history of other malignant neoplasm of skin: Secondary | ICD-10-CM | POA: Diagnosis not present

## 2015-11-11 DIAGNOSIS — R918 Other nonspecific abnormal finding of lung field: Secondary | ICD-10-CM | POA: Diagnosis not present

## 2015-11-11 LAB — CBC WITH DIFFERENTIAL/PLATELET
Basophils Absolute: 0 10*3/uL (ref 0–0.1)
Basophils Relative: 1 %
Eosinophils Absolute: 0.2 10*3/uL (ref 0–0.7)
Eosinophils Relative: 3 %
HCT: 44.8 % (ref 40.0–52.0)
Hemoglobin: 15.2 g/dL (ref 13.0–18.0)
Lymphocytes Relative: 27 %
Lymphs Abs: 1.7 10*3/uL (ref 1.0–3.6)
MCH: 32.4 pg (ref 26.0–34.0)
MCHC: 34 g/dL (ref 32.0–36.0)
MCV: 95.2 fL (ref 80.0–100.0)
Monocytes Absolute: 0.7 10*3/uL (ref 0.2–1.0)
Monocytes Relative: 11 %
Neutro Abs: 3.6 10*3/uL (ref 1.4–6.5)
Neutrophils Relative %: 58 %
Platelets: 216 10*3/uL (ref 150–440)
RBC: 4.71 MIL/uL (ref 4.40–5.90)
RDW: 13.7 % (ref 11.5–14.5)
WBC: 6.3 10*3/uL (ref 3.8–10.6)

## 2015-11-11 LAB — PROTIME-INR
INR: 1.97
Prothrombin Time: 22.3 seconds — ABNORMAL HIGH (ref 11.4–15.0)

## 2015-11-11 NOTE — Progress Notes (Signed)
[place] [facility] day:  11/11/2015  Chief Complaint: Gregg Thomas is a 81 y.o. male with a history of left lower extremity deep venous thrombosis (DVT) and pulmonary embolism who is seen for 4 month assessment.  HPI: The patient was last seen in the medical oncology clinic on 07/08/2015.  At that time,  he was seen for initial assessment by me.  He was doing well on Coumadin 5 mg Saturday - Thursdays and 7.5 mg on Fridays.  He denied any bruising or bleeding.  He has had his INR followed.  He continues on the same dose of Coumadin.  He states that he is having a "normal 81 year old life".  His INR has been "ok".  INR on 10/07/2015 was 1.91 and 2.18 on 10/12/2015.  He denies any bruisng or bleeding.  Past Medical History  Diagnosis Date  . Heart murmur   . Skin cancer     No past surgical history on file.  Family History  Problem Relation Age of Onset  . Stroke Mother     Social History:  reports that he has never smoked. He has never used smokeless tobacco. He reports that he does not drink alcohol or use illicit drugs.  The patient is accompanied by his wife, Gregg Thomas, today.  Allergies: No Known Allergies  Current Medications: Current Outpatient Prescriptions  Medication Sig Dispense Refill  . amLODipine (NORVASC) 10 MG tablet Take by mouth.    . Cyanocobalamin (RA VITAMIN B-12 TR) 1000 MCG TBCR Take by mouth.    . enalapril (VASOTEC) 20 MG tablet Take by mouth.    . Grape Seed Extract 50 MG CAPS Take by mouth.    . warfarin (COUMADIN) 2.5 MG tablet Take 2.5 mg by mouth every Friday.    . warfarin (COUMADIN) 5 MG tablet Take 5 mg by mouth daily at 6 PM.     No current facility-administered medications for this visit.    Review of Systems:  GENERAL:  Feels good.  Active.  No fevers, sweats or weight loss. PERFORMANCE STATUS (ECOG):  0 HEENT:  No visual changes, runny nose, sore throat, mouth sores or tenderness. Lungs: No  shortness of breath or cough.  No hemoptysis. Cardiac:  No chest pain, palpitations, orthopnea, or PND. GI:  No nausea, vomiting, diarrhea, constipation, melena or hematochezia. GU:  No urgency, frequency, dysuria, or hematuria. Musculoskeletal:  No back pain.  No joint pain.  No muscle tenderness. Extremities:  No pain or swelling. Skin:  No rashes or skin changes. Neuro:  No headache, numbness or weakness, balance or coordination issues. Endocrine:  No diabetes, thyroid issues, hot flashes or night sweats. Psych:  No mood changes, depression or anxiety. Pain:  No focal pain. Review of systems:  All other systems reviewed and found to be negative.  Physical Exam: Blood pressure 150/79, pulse 73, temperature 96 F (35.6 C), temperature source Tympanic, weight 180 lb 3.6 oz (81.75 kg). GENERAL:  Well developed, well nourished, sitting comfortably in the exam room in no acute distress. MENTAL STATUS:  Alert and oriented to person, place and time. HEAD:  Pearline Cables hair.  Normocephalic, atraumatic, face symmetric, no Cushingoid features. EYES:  Glasses.  Hazel eyes.  Pupils equal round and reactive to light and accomodation.  No conjunctivitis or scleral icterus. ENT:  Oropharynx clear without lesion.  Dentures.  Tongue normal. Mucous membranes moist.  RESPIRATORY:  Clear to auscultation without rales, wheezes or rhonchi. CARDIOVASCULAR:  Regular  rate and rhythm without murmur, rub or gallop. ABDOMEN:  Soft, non-tender, with active bowel sounds, and no hepatosplenomegaly.  No masses. SKIN:  Multiple scalp lesions (prominent right temple).No rashes, ulcers or lesions. EXTREMITIES: No edema, no skin discoloration or tenderness.  No palpable cords. LYMPH NODES: No palpable cervical, supraclavicular, axillary or inguinal adenopathy  NEUROLOGICAL: Unremarkable. PSYCH:  Appropriate.  Appointment on 11/11/2015  Component Date Value Ref Range Status  . WBC 11/11/2015 6.3  3.8 - 10.6 K/uL Final  .  RBC 11/11/2015 4.71  4.40 - 5.90 MIL/uL Final  . Hemoglobin 11/11/2015 15.2  13.0 - 18.0 g/dL Final  . HCT 11/11/2015 44.8  40.0 - 52.0 % Final  . MCV 11/11/2015 95.2  80.0 - 100.0 fL Final  . MCH 11/11/2015 32.4  26.0 - 34.0 pg Final  . MCHC 11/11/2015 34.0  32.0 - 36.0 g/dL Final  . RDW 11/11/2015 13.7  11.5 - 14.5 % Final  . Platelets 11/11/2015 216  150 - 440 K/uL Final  . Neutrophils Relative % 11/11/2015 58   Final  . Neutro Abs 11/11/2015 3.6  1.4 - 6.5 K/uL Final  . Lymphocytes Relative 11/11/2015 27   Final  . Lymphs Abs 11/11/2015 1.7  1.0 - 3.6 K/uL Final  . Monocytes Relative 11/11/2015 11   Final  . Monocytes Absolute 11/11/2015 0.7  0.2 - 1.0 K/uL Final  . Eosinophils Relative 11/11/2015 3   Final  . Eosinophils Absolute 11/11/2015 0.2  0 - 0.7 K/uL Final  . Basophils Relative 11/11/2015 1   Final  . Basophils Absolute 11/11/2015 0.0  0 - 0.1 K/uL Final    Assessment:  Gregg Thomas is a 81 y.o. male with a DVT and pulmonary embolism on 06/22/2010 following a long car ride. His lifetime risk of recurrent clot is felt to be 30%. Decision was made to hold Coumadin for procedures then restart Coumadin with a Lovenox bridge.  Hypercoagulable workup revealed the following negative studies: Factor V Leiden, prothrombin gene mutation, beta 2 glycoprotein, protein S free and and total, lupus anticoagulant panel, anti-cardiolipin antibodies. Protein C antigen was slightly low at 58%. Anti-ithrombin III was lower limits of normal at 73%. Additional negative studies included a CEA, PSA, SPEP and ANA.  He was hospitalized on 05/21/2014 with a thigh hematoma. His anticoagulation was reversed with FFP. He had surgery. Hemoglobin dropped to 10.8.  He is on Coumadin 5 mg daily except for Fridays (7.5 mg). Total weekly Coumadin dose is 37.5 mg. INR is 1.97 today.   He has a history of small pulmonary nodules on chest CT. Nodules have been stable per radiology (2011 to 2013).  He has never smoked,  Symptomatically, he denies any concerns. He denies any bruising or bleeding. Exam is unremarkable.  Plan: 1. Labs today:  CBC with diff, PT/INR. 2. RTC monthly for PT/INR. 3. RTC in 6 months for MD assessment and labs (CBC with diff, CMP, PT/INR).    Lequita Asal, MD  11/11/2015, 3:23 PM

## 2015-11-20 IMAGING — CT CT EXTREM LOW W/O CM*R*
3 of 4 series · 16 of 33 positions shown, 19 images · non-contrast
Comparison: Right knee/fever radiographs dated 05/01/2014

CLINICAL DATA: Status post fall, on Coumadin, severe swelling

EXAM:
CT OF THE LOWER RIGHT EXTREMITY WITHOUT CONTRAST
TECHNIQUE: Multidetector CT imaging of the lower right extremity was performed
according to the standard protocol. Multiplanar CT image
reconstructions were also generated.

[Series 8: knee soft tissue 3s · axial · 0.48mm/px · z∈[-538,-156]mm · 8 of 159 slices shown, 10 images]
[im 16/159  soft-tissue]
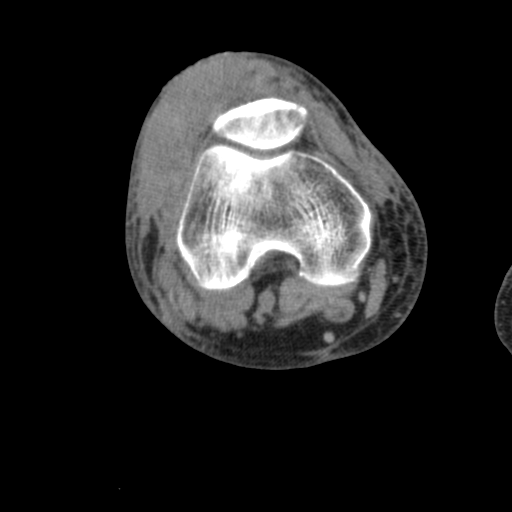
[im 16/159  bone]
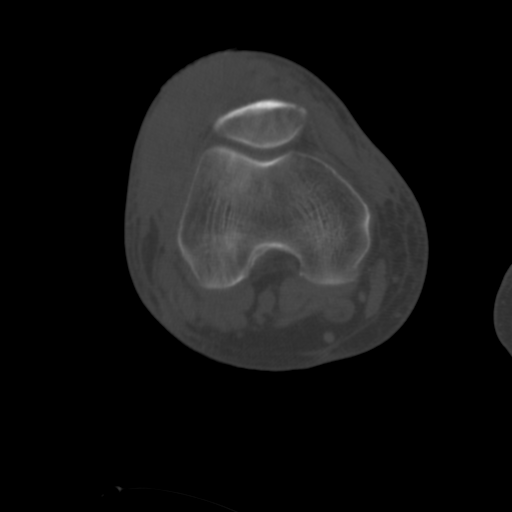
[im 32/159  bone]
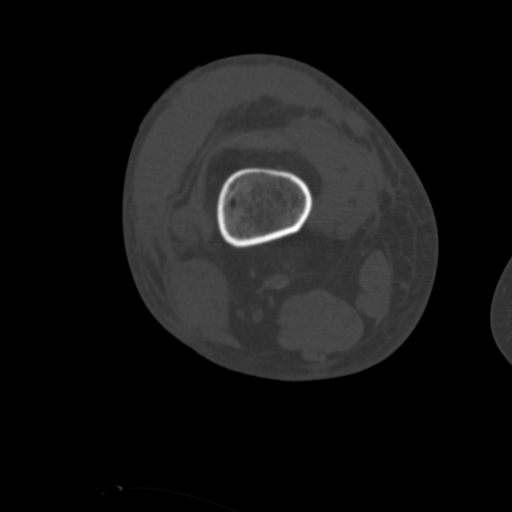
[im 48/159  bone]
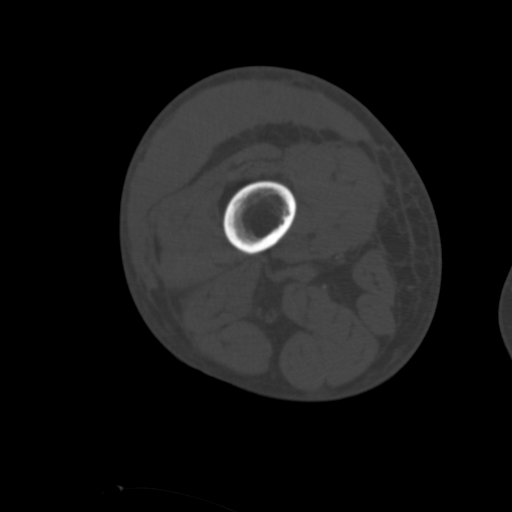
[im 64/159  bone]
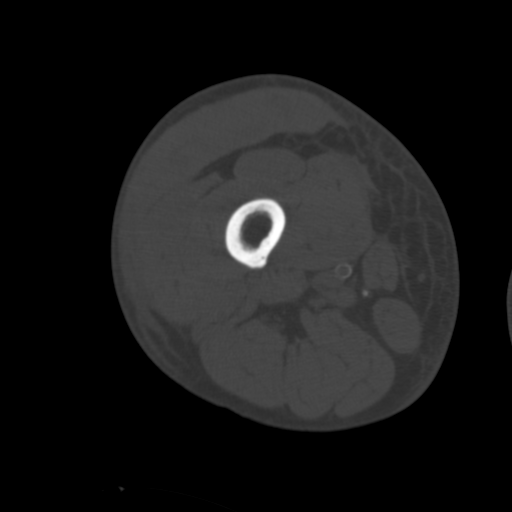
[im 95/159  soft-tissue]
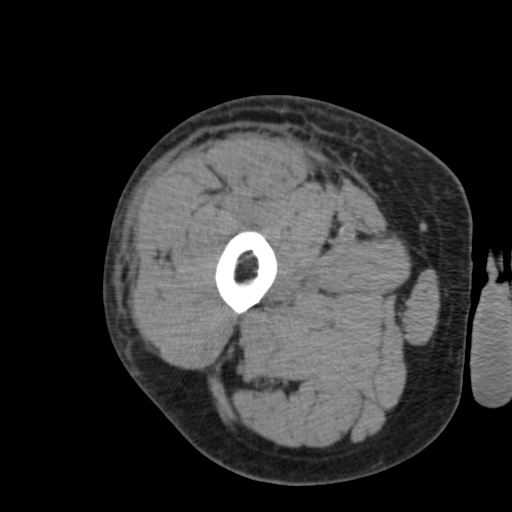
[im 95/159  bone]
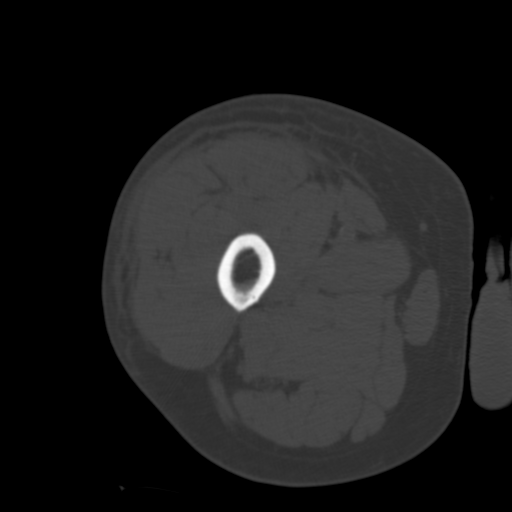
[im 111/159  bone]
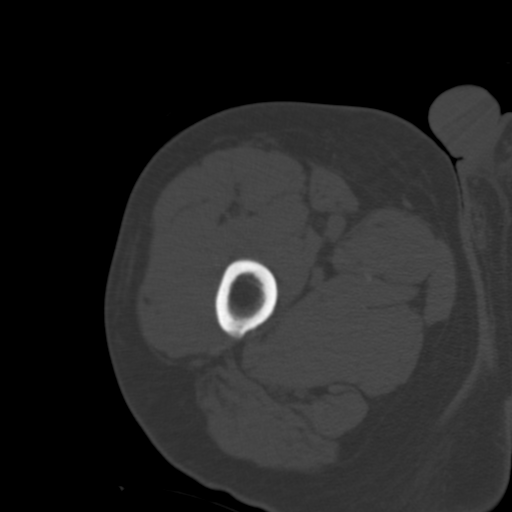
[im 127/159  bone]
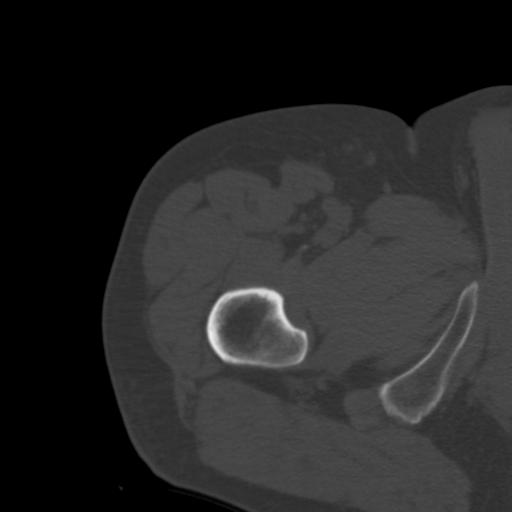
[im 143/159  bone]
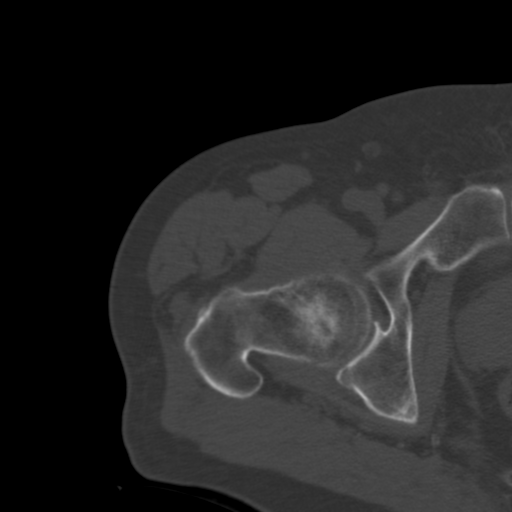

[Series 604: cor st · coronal · 0.94mm/px · 3 of 95 slices shown]
[im 19/95  bone]
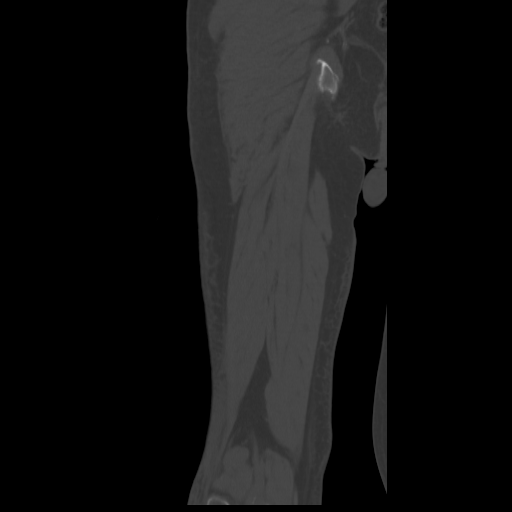
[im 38/95  bone]
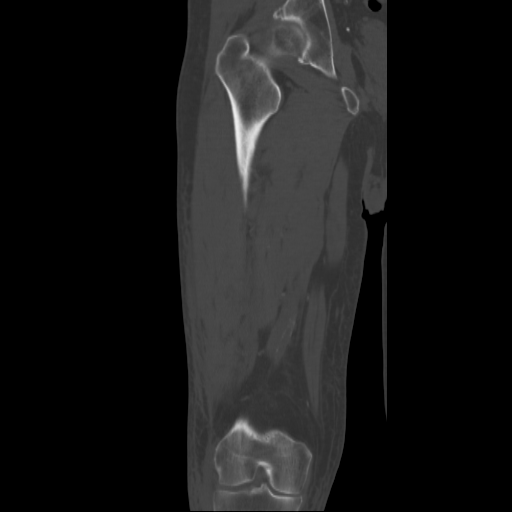
[im 57/95  bone]
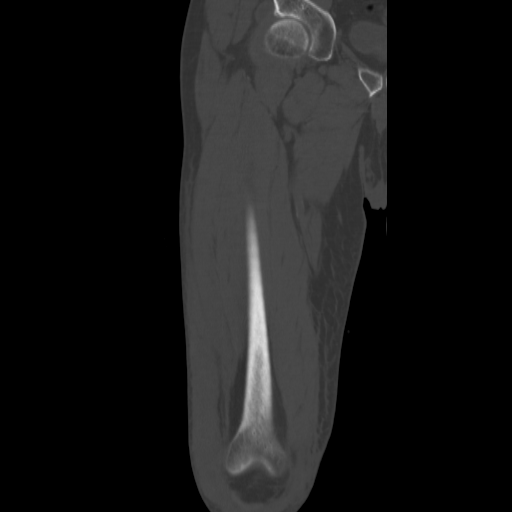

[Series 605: sag st · sagittal · 0.94mm/px · 5 of 81 slices shown, 6 images]
[im 27/81  bone]
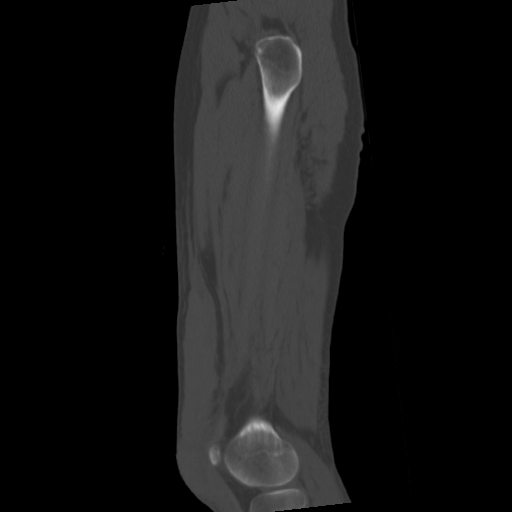
[im 34/81  bone]
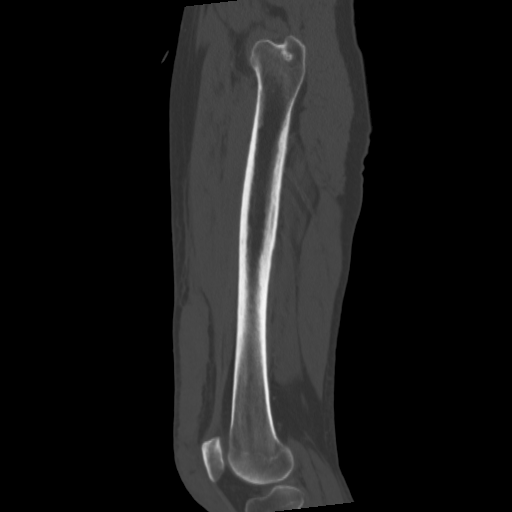
[im 41/81  soft-tissue]
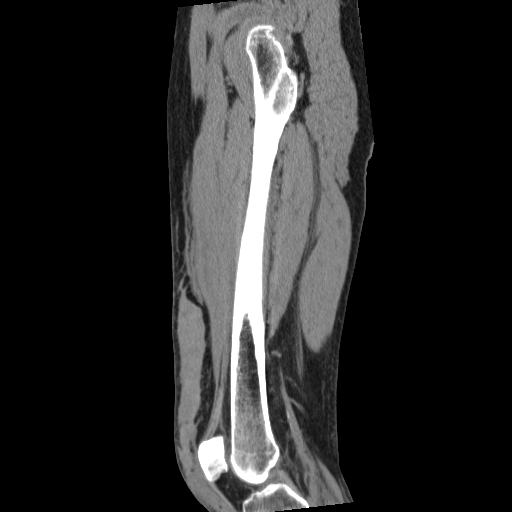
[im 41/81  bone]
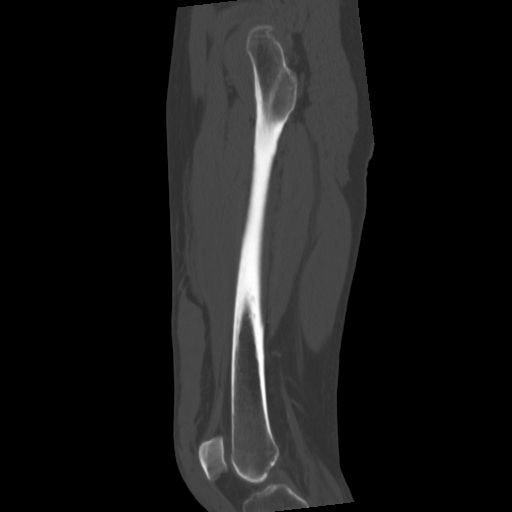
[im 47/81  bone]
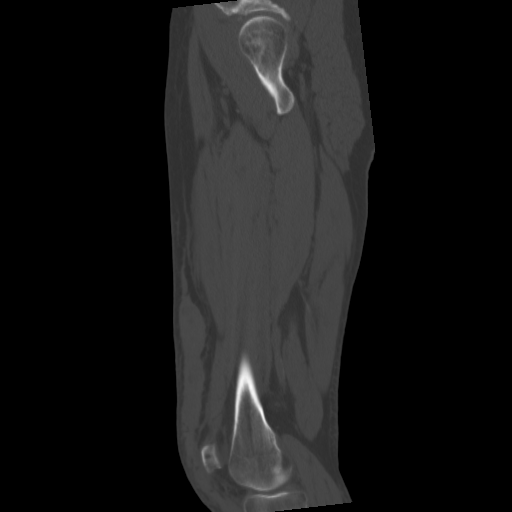
[im 54/81  bone]
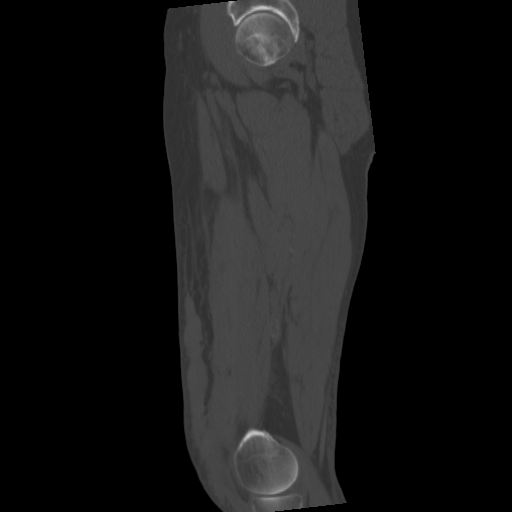

[16 of 33 positions shown; findings below may reference images not displayed]

FINDINGS: Subcutaneous hematoma along the anterior aspect of the distal
thigh/knee, measuring approximately 2.7 x 10.6 x 20.2 cm. On axial
imaging, there is a crescent appearance, and the fluid/hemorrhage is
located between the cutaneous layer and subcutaneous tissues (series
8/ image 125). Hematoma proximally extends to the level of the mid
tibial shaft (series 605/ image 36) and distally extends to the
level of the tibial tuberosity.

The appearance/location at least raises the possibility of a closed
degloving injury (Wirgo Dauda lesion).

Underlying quadriceps musculature does not appear involved.
Quadriceps and patellar tendons appear intact by CT.

No suprapatellar knee joint effusion.

No fracture is seen.
IMPRESSION: Subcutaneous hematoma along the anterior aspect of the distal
thigh/knee, measuring approximately 2.7 x 10.6 x 20.2 cm, as
described above.

The appearance/location at least raises the possibility of a closed
degloving injury (Wirgo Dauda lesion).

Underlying quadriceps musculature does not appear involved.
Quadriceps and patellar tendons appear intact by CT.

These results were called by telephone at the time of interpretation
on 05/01/2014 at [DATE] to Dr. AVTOSALON OSMAN OGKU , who verbally
acknowledged these results.

## 2015-11-20 IMAGING — CT CT HEAD WITHOUT CONTRAST
2 series · 14 of 30 positions shown, 16 images · non-contrast
Comparison: None.

REASON FOR EXAM: fall, +coumadin
COMMENTS:

PROCEDURE:     CT  - CT HEAD WITHOUT CONTRAST  - May 01, 2014  [DATE]
CLINICAL DATA: Fall, hematoma to right eye, on Coumadin
EXAM:
CT HEAD WITHOUT CONTRAST
TECHNIQUE: Contiguous axial images were obtained from the base of the skull
through the vertex without intravenous contrast.

[Series 2: head wo · axial · 0.49mm/px · z∈[-56,+52]mm · 6 of 34 slices shown, 8 images]
[im 5/34  brain]
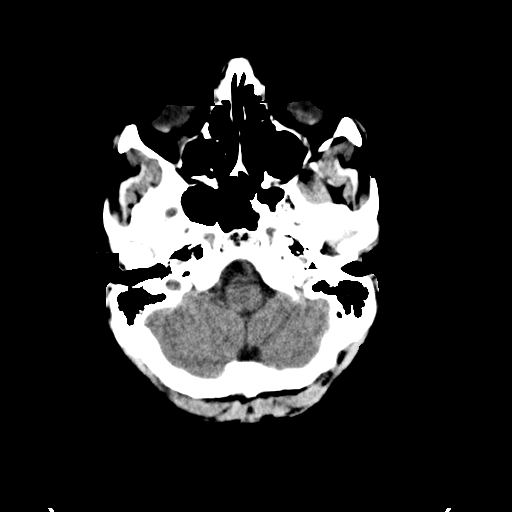
[im 5/34  bone]
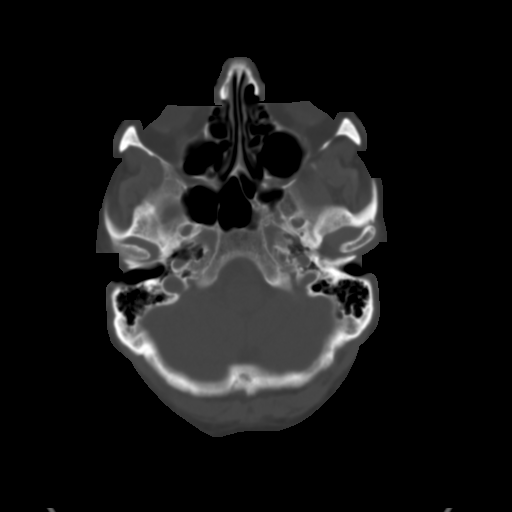
[im 10/34  brain]
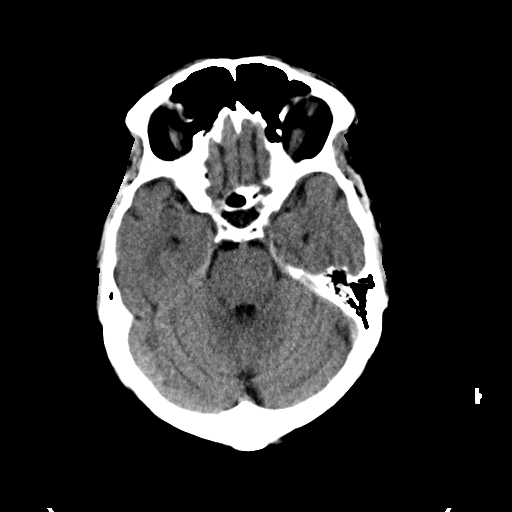
[im 15/34  brain]
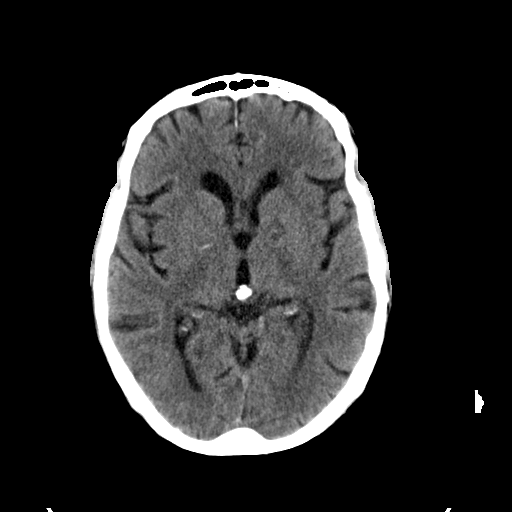
[im 19/34  brain]
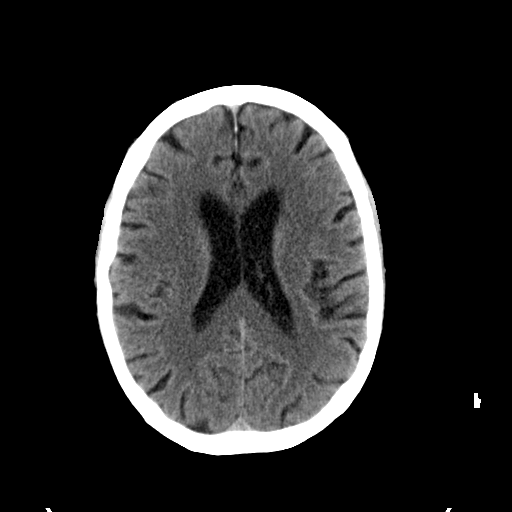
[im 24/34  brain]
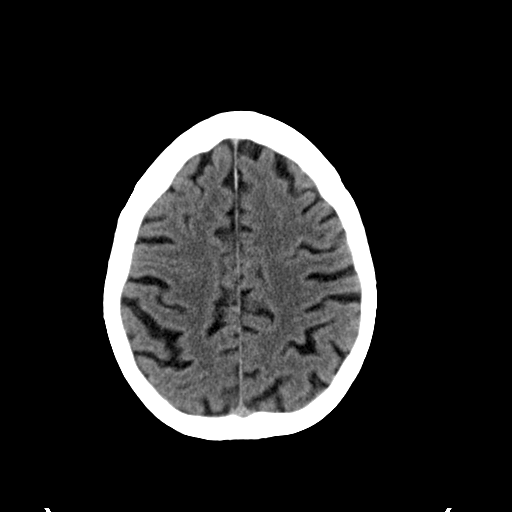
[im 24/34  bone]
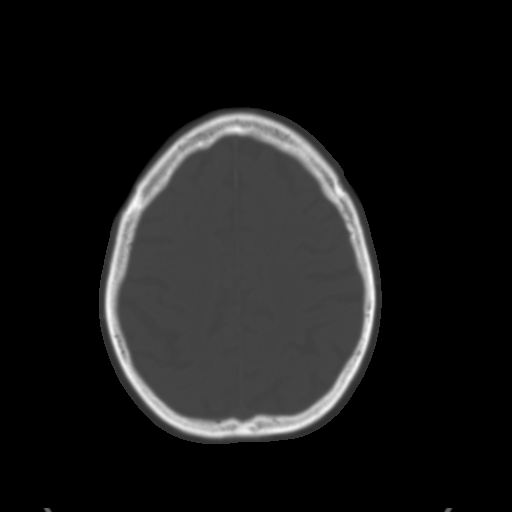
[im 29/34  brain]
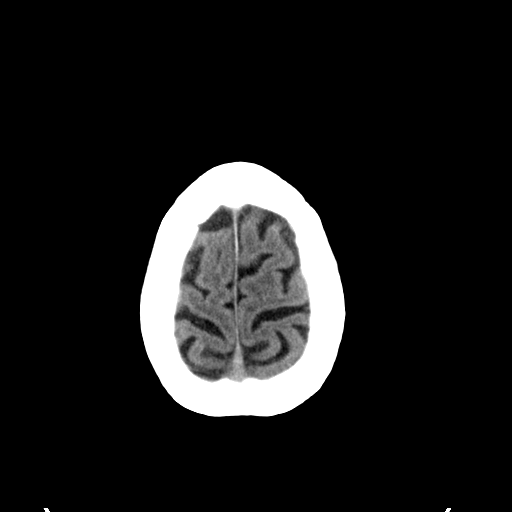

[Series 3: head bone · axial · 0.49mm/px · z∈[-62,+61]mm · 8 of 102 slices shown]
[im 10/102  bone]
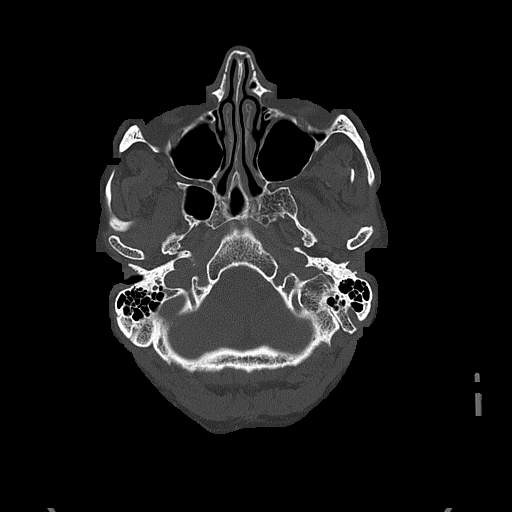
[im 20/102  bone]
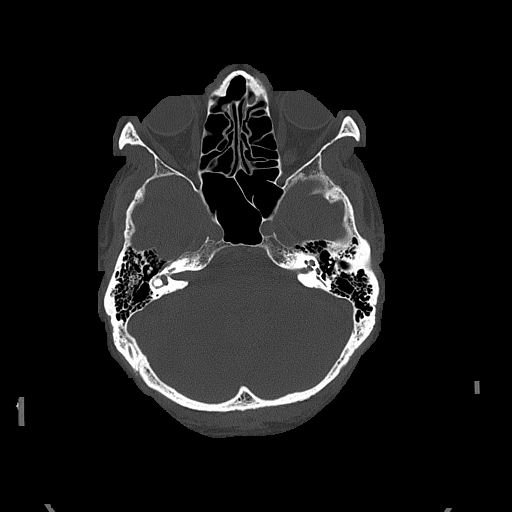
[im 34/102  bone]
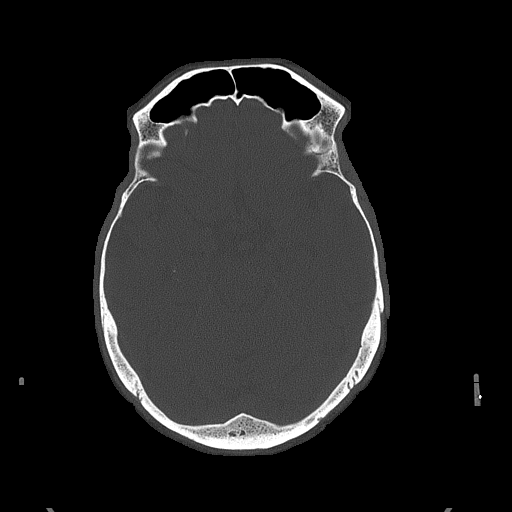
[im 44/102  bone]
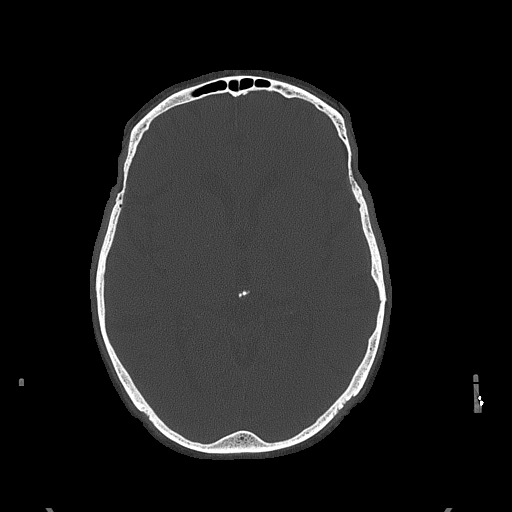
[im 58/102  bone]
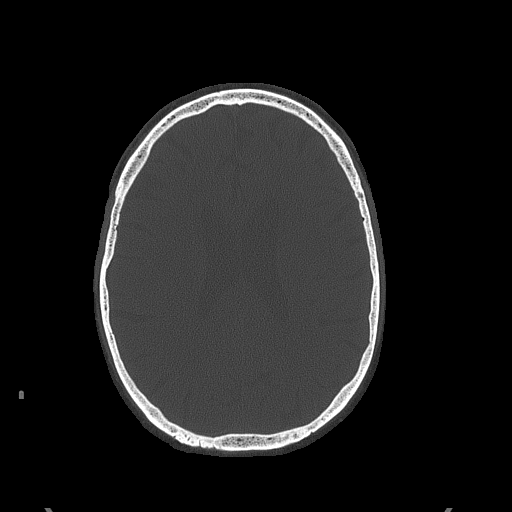
[im 68/102  bone]
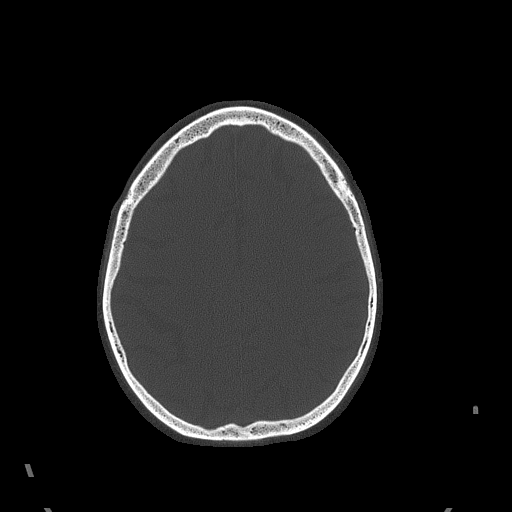
[im 82/102  bone]
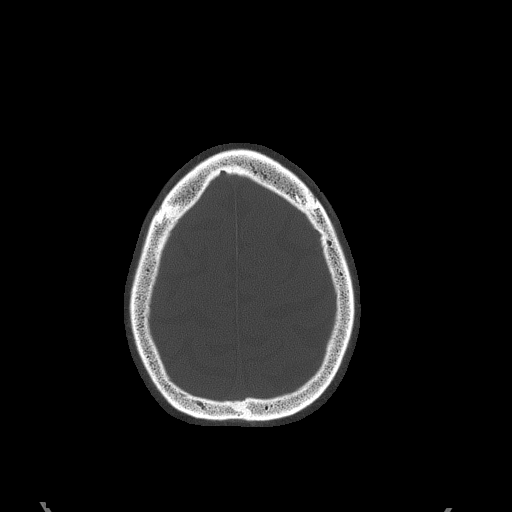
[im 92/102  bone]
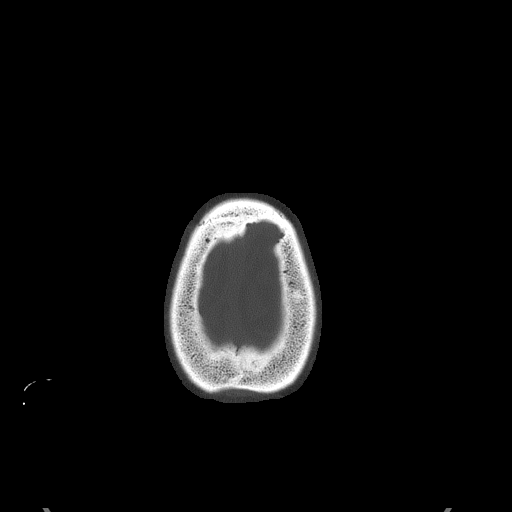

[14 of 30 positions shown; findings below may reference images not displayed]

FINDINGS: No evidence of parenchymal hemorrhage or extra-axial fluid
collection. No mass lesion, mass effect, or midline shift.

No CT evidence of acute infarction.

Subcortical white matter and periventricular small vessel ischemic
changes.

Mild age related atrophy.  No ventriculomegaly.

The visualized paranasal sinuses are essentially clear. The mastoid
air cells are unopacified.

No evidence of calvarial fracture.
IMPRESSION: No evidence of acute intracranial abnormality.

Age related atrophy with mild small vessel ischemic changes.

## 2015-12-12 ENCOUNTER — Inpatient Hospital Stay: Payer: Medicare Other | Attending: Hematology and Oncology

## 2015-12-12 DIAGNOSIS — Z86718 Personal history of other venous thrombosis and embolism: Secondary | ICD-10-CM | POA: Insufficient documentation

## 2015-12-12 LAB — PROTIME-INR
INR: 2.22
Prothrombin Time: 24.4 seconds — ABNORMAL HIGH (ref 11.4–15.0)

## 2016-01-12 ENCOUNTER — Other Ambulatory Visit: Payer: Self-pay

## 2016-01-12 ENCOUNTER — Inpatient Hospital Stay: Payer: Medicare Other | Attending: Hematology and Oncology

## 2016-01-12 ENCOUNTER — Telehealth: Payer: Self-pay

## 2016-01-12 DIAGNOSIS — I82401 Acute embolism and thrombosis of unspecified deep veins of right lower extremity: Secondary | ICD-10-CM

## 2016-01-12 DIAGNOSIS — Z86718 Personal history of other venous thrombosis and embolism: Secondary | ICD-10-CM | POA: Insufficient documentation

## 2016-01-12 DIAGNOSIS — I2699 Other pulmonary embolism without acute cor pulmonale: Secondary | ICD-10-CM

## 2016-01-12 LAB — PROTIME-INR
INR: 2.03
Prothrombin Time: 22.8 seconds — ABNORMAL HIGH (ref 11.4–15.0)

## 2016-01-12 NOTE — Telephone Encounter (Signed)
Called pt per MD to report INR good.Recheck in 1 month.  Coumadin called in by [facility].  Pt verbalized an understanding no concerns or questions noted.

## 2016-02-08 ENCOUNTER — Encounter: Payer: Self-pay | Admitting: Hematology and Oncology

## 2016-02-13 ENCOUNTER — Inpatient Hospital Stay: Payer: Medicare Other | Attending: Hematology and Oncology

## 2016-02-13 DIAGNOSIS — Z86718 Personal history of other venous thrombosis and embolism: Secondary | ICD-10-CM | POA: Insufficient documentation

## 2016-02-13 DIAGNOSIS — I2782 Chronic pulmonary embolism: Secondary | ICD-10-CM

## 2016-02-13 DIAGNOSIS — I82401 Acute embolism and thrombosis of unspecified deep veins of right lower extremity: Secondary | ICD-10-CM

## 2016-02-14 ENCOUNTER — Telehealth: Payer: Self-pay

## 2016-02-14 LAB — PROTIME-INR
INR: 2.54
Prothrombin Time: 27 seconds — ABNORMAL HIGH (ref 11.4–15.0)

## 2016-02-14 NOTE — Telephone Encounter (Signed)
Called pt left message for pt that INR is good Per MD.  No changes, recheck in a month,  call back with any concerns.

## 2016-03-09 ENCOUNTER — Inpatient Hospital Stay: Payer: Medicare Other | Attending: Hematology and Oncology

## 2016-03-09 DIAGNOSIS — Z86718 Personal history of other venous thrombosis and embolism: Secondary | ICD-10-CM | POA: Insufficient documentation

## 2016-03-12 ENCOUNTER — Other Ambulatory Visit: Payer: Medicare Other

## 2016-03-15 ENCOUNTER — Inpatient Hospital Stay: Payer: Medicare Other

## 2016-03-15 ENCOUNTER — Telehealth: Payer: Self-pay

## 2016-03-15 DIAGNOSIS — I82401 Acute embolism and thrombosis of unspecified deep veins of right lower extremity: Secondary | ICD-10-CM

## 2016-03-15 DIAGNOSIS — Z86718 Personal history of other venous thrombosis and embolism: Secondary | ICD-10-CM | POA: Diagnosis not present

## 2016-03-15 DIAGNOSIS — I2782 Chronic pulmonary embolism: Secondary | ICD-10-CM

## 2016-03-15 LAB — PROTIME-INR
INR: 2.19
Prothrombin Time: 24.2 seconds — ABNORMAL HIGH (ref 11.4–15.0)

## 2016-03-15 NOTE — Telephone Encounter (Signed)
Called pt per MD.  Per MD INR good and to continue current dose of Coumadin.  Pt verbalized he has a appointment to recheck in one month.  No other Concerns noted.

## 2016-04-12 ENCOUNTER — Inpatient Hospital Stay: Payer: Medicare Other | Attending: Hematology and Oncology

## 2016-04-12 DIAGNOSIS — Z86718 Personal history of other venous thrombosis and embolism: Secondary | ICD-10-CM | POA: Insufficient documentation

## 2016-04-12 DIAGNOSIS — I82401 Acute embolism and thrombosis of unspecified deep veins of right lower extremity: Secondary | ICD-10-CM

## 2016-04-12 DIAGNOSIS — I2782 Chronic pulmonary embolism: Secondary | ICD-10-CM

## 2016-04-12 LAB — PROTIME-INR
INR: 2.02
Prothrombin Time: 22.7 seconds — ABNORMAL HIGH (ref 11.4–15.0)

## 2016-05-11 ENCOUNTER — Inpatient Hospital Stay (HOSPITAL_BASED_OUTPATIENT_CLINIC_OR_DEPARTMENT_OTHER): Payer: Medicare Other | Admitting: Hematology and Oncology

## 2016-05-11 ENCOUNTER — Inpatient Hospital Stay: Payer: Medicare Other | Attending: Hematology and Oncology

## 2016-05-11 ENCOUNTER — Telehealth: Payer: Self-pay

## 2016-05-11 VITALS — BP 137/78 | HR 66 | Temp 95.6°F | Resp 18 | Ht 67.0 in | Wt 182.3 lb

## 2016-05-11 DIAGNOSIS — H02103 Unspecified ectropion of right eye, unspecified eyelid: Secondary | ICD-10-CM

## 2016-05-11 DIAGNOSIS — R918 Other nonspecific abnormal finding of lung field: Secondary | ICD-10-CM

## 2016-05-11 DIAGNOSIS — I2782 Chronic pulmonary embolism: Secondary | ICD-10-CM | POA: Diagnosis present

## 2016-05-11 DIAGNOSIS — R011 Cardiac murmur, unspecified: Secondary | ICD-10-CM

## 2016-05-11 DIAGNOSIS — Z79899 Other long term (current) drug therapy: Secondary | ICD-10-CM

## 2016-05-11 DIAGNOSIS — Z86718 Personal history of other venous thrombosis and embolism: Secondary | ICD-10-CM

## 2016-05-11 DIAGNOSIS — Z7901 Long term (current) use of anticoagulants: Secondary | ICD-10-CM | POA: Diagnosis not present

## 2016-05-11 DIAGNOSIS — Z85828 Personal history of other malignant neoplasm of skin: Secondary | ICD-10-CM

## 2016-05-11 LAB — CBC WITH DIFFERENTIAL/PLATELET
Basophils Absolute: 0.1 10*3/uL (ref 0–0.1)
Basophils Relative: 1 %
Eosinophils Absolute: 0.2 10*3/uL (ref 0–0.7)
Eosinophils Relative: 3 %
HCT: 43.7 % (ref 40.0–52.0)
Hemoglobin: 15.2 g/dL (ref 13.0–18.0)
Lymphocytes Relative: 28 %
Lymphs Abs: 1.7 10*3/uL (ref 1.0–3.6)
MCH: 33.3 pg (ref 26.0–34.0)
MCHC: 34.9 g/dL (ref 32.0–36.0)
MCV: 95.5 fL (ref 80.0–100.0)
Monocytes Absolute: 0.7 10*3/uL (ref 0.2–1.0)
Monocytes Relative: 12 %
Neutro Abs: 3.4 10*3/uL (ref 1.4–6.5)
Neutrophils Relative %: 56 %
Platelets: 213 10*3/uL (ref 150–440)
RBC: 4.58 MIL/uL (ref 4.40–5.90)
RDW: 14.2 % (ref 11.5–14.5)
WBC: 6 10*3/uL (ref 3.8–10.6)

## 2016-05-11 LAB — COMPREHENSIVE METABOLIC PANEL
ALT: 20 U/L (ref 17–63)
AST: 24 U/L (ref 15–41)
Albumin: 4.1 g/dL (ref 3.5–5.0)
Alkaline Phosphatase: 87 U/L (ref 38–126)
Anion gap: 5 (ref 5–15)
BUN: 26 mg/dL — ABNORMAL HIGH (ref 6–20)
CO2: 24 mmol/L (ref 22–32)
Calcium: 9.2 mg/dL (ref 8.9–10.3)
Chloride: 111 mmol/L (ref 101–111)
Creatinine, Ser: 1.93 mg/dL — ABNORMAL HIGH (ref 0.61–1.24)
GFR calc Af Amer: 36 mL/min — ABNORMAL LOW (ref >60)
GFR calc non Af Amer: 31 mL/min — ABNORMAL LOW (ref >60)
Glucose, Bld: 94 mg/dL (ref 65–99)
Potassium: 4.4 mmol/L (ref 3.5–5.1)
Sodium: 140 mmol/L (ref 135–145)
Total Bilirubin: 0.8 mg/dL (ref 0.3–1.2)
Total Protein: 7.3 g/dL (ref 6.5–8.1)

## 2016-05-11 LAB — PROTIME-INR
INR: 1.95
Prothrombin Time: 22.1 seconds — ABNORMAL HIGH (ref 11.4–15.0)

## 2016-05-11 NOTE — Progress Notes (Signed)
Pt reports having acid reflux after eating.  Pt reports having moyse surgery right below right eye was completed at [facility] dermatology

## 2016-05-11 NOTE — Telephone Encounter (Signed)
Called and spoke with pt's wife [name] and reported that the INR is good per MD no changes in medication.  Per [name] she verbalized an undstanding pt was driving.

## 2016-05-11 NOTE — Progress Notes (Signed)
[facility] day:  05/11/2016  Chief Complaint: [facility] is a 82 y.o. male with a history of left lower extremity deep venous thrombosis (DVT) and pulmonary embolism who is seen for 6 month assessment.  HPI: The patient was last seen in the medical oncology clinic on 11/11/2015.  At that time,  He was doing well on Coumadin 5 mg daily except for Fridays (7.5 mg).  INR was 1.97.  He has had monthly INR checks.  INR has ranged between 2.02 and 2.54.  He states that he recently saw Dr. Holley Raring.  His enalapril is being doubled.  During the interim, he was continued to do well.  He had Mohs surgery on the right malar cheek for basal cell carcinoma.  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  He remained on Coumadin during his procedure.   Past Medical History  Diagnosis Date  . Heart murmur   . Skin cancer     No past surgical history on file.  Family History  Problem Relation Age of Onset  . Stroke Mother     Social History:  reports that he has never smoked. He has never used smokeless tobacco. He reports that he does not drink alcohol or use illicit drugs.  The patient is alone today.  Allergies: No Known Allergies  Current Medications: Current Outpatient Prescriptions  Medication Sig Dispense Refill  . amLODipine (NORVASC) 10 MG tablet Take by mouth.    . Cyanocobalamin (RA VITAMIN B-12 TR) 1000 MCG TBCR Take by mouth.    . enalapril (VASOTEC) 20 MG tablet Take by mouth.    . Grape Seed Extract 50 MG CAPS Take by mouth.    . warfarin (COUMADIN) 2.5 MG tablet Take 2.5 mg by mouth every Friday.    . warfarin (COUMADIN) 5 MG tablet Take 5 mg by mouth daily at 6 PM.     No current facility-administered medications for this visit.    Review of Systems:  GENERAL:  Feels good.  Active.  No fevers or sweats.  Weight up 2 pounds. PERFORMANCE STATUS (ECOG):  0 HEENT:  No visual changes, runny nose, sore  throat, mouth sores or tenderness. Lungs: No shortness of breath or cough.  No hemoptysis. Cardiac:  No chest pain, palpitations, orthopnea, or PND. GI:  No nausea, vomiting, diarrhea, constipation, melena or hematochezia. GU:  No urgency, frequency, dysuria, or hematuria. Musculoskeletal:  No back pain.  No joint pain.  No muscle tenderness. Extremities:  No pain or swelling. Skin:  No rashes or skin changes. Neuro:  No headache, numbness or weakness, balance or coordination issues. Endocrine:  No diabetes, thyroid issues, hot flashes or night sweats. Psych:  No mood changes, depression or anxiety. Pain:  No focal pain. Review of systems:  All other systems reviewed and found to be negative.  Physical Exam: Blood pressure 137/78, pulse 66, temperature 95.6 F (35.3 C), temperature source Tympanic, resp. rate 18, height '5\' 7"'  (1.702 m), weight 182 lb 5.1 oz (82.7 kg). GENERAL:  Well developed, well nourished, sitting comfortably in the exam room in no acute distress. MENTAL STATUS:  Alert and oriented to person, place and time. HEAD:  Pearline Cables hair.  Normocephalic, atraumatic, face symmetric, no Cushingoid features. EYES:  Glasses.  Hazel eyes.  Right ectropion.  Pupils equal round and reactive to light and accomodation.  No conjunctivitis or scleral icterus. ENT:  Oropharynx clear without lesion.  Dentures.  Tongue  normal. Mucous membranes moist.  RESPIRATORY:  Clear to auscultation without rales, wheezes or rhonchi. CARDIOVASCULAR:  Regular rate and rhythm without murmur, rub or gallop. ABDOMEN:  Soft, non-tender, with active bowel sounds, and no hepatosplenomegaly.  No masses. SKIN:  s/p Mohs surgery right cheek with subsequent right ectropion.  Multiple scalp lesions..No rashes, ulcers or lesions. EXTREMITIES: No edema, no skin discoloration or tenderness.  No palpable cords. LYMPH NODES: No palpable cervical, supraclavicular, axillary or inguinal adenopathy  NEUROLOGICAL:  Unremarkable. PSYCH:  Appropriate.  Appointment on 05/11/2016  Component Date Value Ref Range Status  . WBC 05/11/2016 6.0  3.8 - 10.6 K/uL Final  . RBC 05/11/2016 4.58  4.40 - 5.90 MIL/uL Final  . Hemoglobin 05/11/2016 15.2  13.0 - 18.0 g/dL Final  . HCT 05/11/2016 43.7  40.0 - 52.0 % Final  . MCV 05/11/2016 95.5  80.0 - 100.0 fL Final  . MCH 05/11/2016 33.3  26.0 - 34.0 pg Final  . MCHC 05/11/2016 34.9  32.0 - 36.0 g/dL Final  . RDW 05/11/2016 14.2  11.5 - 14.5 % Final  . Platelets 05/11/2016 213  150 - 440 K/uL Final  . Neutrophils Relative % 05/11/2016 56   Final  . Neutro Abs 05/11/2016 3.4  1.4 - 6.5 K/uL Final  . Lymphocytes Relative 05/11/2016 28   Final  . Lymphs Abs 05/11/2016 1.7  1.0 - 3.6 K/uL Final  . Monocytes Relative 05/11/2016 12   Final  . Monocytes Absolute 05/11/2016 0.7  0.2 - 1.0 K/uL Final  . Eosinophils Relative 05/11/2016 3   Final  . Eosinophils Absolute 05/11/2016 0.2  0 - 0.7 K/uL Final  . Basophils Relative 05/11/2016 1   Final  . Basophils Absolute 05/11/2016 0.1  0 - 0.1 K/uL Final  . Sodium 05/11/2016 140  135 - 145 mmol/L Final  . Potassium 05/11/2016 4.4  3.5 - 5.1 mmol/L Final  . Chloride 05/11/2016 111  101 - 111 mmol/L Final  . CO2 05/11/2016 24  22 - 32 mmol/L Final  . Glucose, Bld 05/11/2016 94  65 - 99 mg/dL Final  . BUN 05/11/2016 26* 6 - 20 mg/dL Final  . Creatinine, Ser 05/11/2016 1.93* 0.61 - 1.24 mg/dL Final  . Calcium 05/11/2016 9.2  8.9 - 10.3 mg/dL Final  . Total Protein 05/11/2016 7.3  6.5 - 8.1 g/dL Final  . Albumin 05/11/2016 4.1  3.5 - 5.0 g/dL Final  . AST 05/11/2016 24  15 - 41 U/L Final  . ALT 05/11/2016 20  17 - 63 U/L Final  . Alkaline Phosphatase 05/11/2016 87  38 - 126 U/L Final  . Total Bilirubin 05/11/2016 0.8  0.3 - 1.2 mg/dL Final  . GFR calc non Af Amer 05/11/2016 31* >60 mL/min Final  . GFR calc Af Amer 05/11/2016 36* >60 mL/min Final   Comment: (NOTE) The eGFR has been calculated using the CKD EPI  equation. This calculation has not been validated in all clinical situations. eGFR's persistently <60 mL/min signify possible Chronic Kidney Disease.   . Anion gap 05/11/2016 5  5 - 15 Final    Assessment:  Gregg Thomas is a 82 y.o. male with a DVT and pulmonary embolism on 06/22/2010 following a long car ride. His lifetime risk of recurrent clot is felt to be 30%. Decision was made to hold Coumadin for procedures then restart Coumadin with a Lovenox bridge.  Hypercoagulable workup revealed the following negative studies: Factor V Leiden, prothrombin gene mutation, beta 2 glycoprotein, protein  S free and and total, lupus anticoagulant panel, anti-cardiolipin antibodies. Protein C antigen was slightly low at 58%. Anti-ithrombin III was lower limits of normal at 73%. Additional negative studies included a CEA, PSA, SPEP and ANA.  He was hospitalized on 05/21/2014 with a thigh hematoma. His anticoagulation was reversed with FFP. He had surgery. Hemoglobin dropped to 10.8.  He is on Coumadin 5 mg daily except for Fridays (7.5 mg). Total weekly Coumadin dose is 37.5 mg.   He has a history of small pulmonary nodules on chest CT. Nodules have been stable per radiology (2011 to 2013). He has never smoked,  He has recently had Mohs surgery of the right cheek for a basal cell carcinoma.  He requires surgery for right ectropion.  Symptomatically, he denies any bruising or bleeding. Exam is unremarkable.  INR is 1.95.  Creatinine is 1.93 (CrCl 31 ml/min).  Plan: 1.  Labs today:  CBC with diff, CMP, PT/INR. 2.  Continue current dose of Coumadin. 3.  RTC monthly for PT/INR. 4.  RTC in 6 months for MD assessment and labs (CBC with diff, CMP, PT/INR).    Lequita Asal, MD  05/11/2016, 2:53 PM

## 2016-05-14 ENCOUNTER — Encounter: Payer: Self-pay | Admitting: Hematology and Oncology

## 2016-05-25 ENCOUNTER — Other Ambulatory Visit: Payer: Self-pay | Admitting: Hematology and Oncology

## 2016-06-11 ENCOUNTER — Inpatient Hospital Stay: Payer: Medicare Other | Attending: Hematology and Oncology

## 2016-06-11 ENCOUNTER — Telehealth: Payer: Self-pay

## 2016-06-11 DIAGNOSIS — Z86711 Personal history of pulmonary embolism: Secondary | ICD-10-CM | POA: Diagnosis present

## 2016-06-11 DIAGNOSIS — I2782 Chronic pulmonary embolism: Secondary | ICD-10-CM

## 2016-06-11 DIAGNOSIS — I82401 Acute embolism and thrombosis of unspecified deep veins of right lower extremity: Secondary | ICD-10-CM

## 2016-06-11 DIAGNOSIS — Z86718 Personal history of other venous thrombosis and embolism: Secondary | ICD-10-CM | POA: Diagnosis not present

## 2016-06-11 LAB — PROTIME-INR
INR: 2.56
Prothrombin Time: 27.2 seconds — ABNORMAL HIGH (ref 11.4–15.0)

## 2016-06-11 NOTE — Telephone Encounter (Signed)
Called pt per MD continue current dose coumadin.  Pt verbalized an understanding.  No other concerns noted

## 2016-06-12 ENCOUNTER — Telehealth: Payer: Self-pay | Admitting: *Deleted

## 2016-06-12 NOTE — Telephone Encounter (Signed)
-----   Message from Lequita Asal, MD sent at 06/11/2016  4:14 PM EDT ----- Regarding: Please call patient  INR 2.56.  Continue current dose of Coumadin. Continue monthly checks.  M  ----- Message -----    From: Lab In [place] Interface    Sent: 06/11/2016   3:54 PM      To: Lequita Asal, MD

## 2016-06-12 NOTE — Telephone Encounter (Signed)
Called cell phone and left message that coumadin level was good and he should stay on same dose and any questions please call our office.  Pt has monthly sch. Lab appt til November when he is seen by md again.

## 2016-06-18 ENCOUNTER — Other Ambulatory Visit: Payer: Self-pay | Admitting: Hematology and Oncology

## 2016-07-11 ENCOUNTER — Telehealth: Payer: Self-pay

## 2016-07-11 ENCOUNTER — Inpatient Hospital Stay: Payer: Medicare Other | Attending: Hematology and Oncology

## 2016-07-11 DIAGNOSIS — Z86711 Personal history of pulmonary embolism: Secondary | ICD-10-CM | POA: Diagnosis not present

## 2016-07-11 DIAGNOSIS — Z86718 Personal history of other venous thrombosis and embolism: Secondary | ICD-10-CM | POA: Insufficient documentation

## 2016-07-11 DIAGNOSIS — I2782 Chronic pulmonary embolism: Secondary | ICD-10-CM

## 2016-07-11 DIAGNOSIS — I82401 Acute embolism and thrombosis of unspecified deep veins of right lower extremity: Secondary | ICD-10-CM

## 2016-07-11 LAB — PROTIME-INR
INR: 2.08
Prothrombin Time: 23.2 seconds — ABNORMAL HIGH (ref 11.4–15.0)

## 2016-07-11 NOTE — Telephone Encounter (Signed)
-----   Message from Luella Cook, RN sent at 07/11/2016  4:57 PM EDT ----- Regarding: FW: Please call patient   ----- Message -----    From: Lequita Asal, MD    Sent: 07/11/2016   4:43 PM      To: Luella Cook, RN Subject: Please call patient                             INR good.  Continue current dose of Coumadin.  M  ----- Message -----    From: Lab In [place] Interface    Sent: 07/11/2016   3:23 PM      To: Lequita Asal, MD

## 2016-07-11 NOTE — Telephone Encounter (Signed)
Called patient and gave him the message below that his INR was good and continue current dose of coumadin. Patient verbalized understanding and all questions answered.

## 2016-07-18 ENCOUNTER — Other Ambulatory Visit: Payer: Self-pay | Admitting: Hematology and Oncology

## 2016-08-10 ENCOUNTER — Inpatient Hospital Stay: Payer: Medicare Other | Attending: Hematology and Oncology | Admitting: *Deleted

## 2016-08-10 ENCOUNTER — Telehealth: Payer: Self-pay

## 2016-08-10 DIAGNOSIS — Z86718 Personal history of other venous thrombosis and embolism: Secondary | ICD-10-CM | POA: Diagnosis not present

## 2016-08-10 DIAGNOSIS — I82402 Acute embolism and thrombosis of unspecified deep veins of left lower extremity: Secondary | ICD-10-CM

## 2016-08-10 LAB — PROTIME-INR
INR: 2.22
Prothrombin Time: 25 seconds — ABNORMAL HIGH (ref 11.4–15.2)

## 2016-08-10 NOTE — Telephone Encounter (Signed)
-----   Message from Lequita Asal, MD sent at 08/10/2016  4:39 PM EDT ----- Regarding: Please call patient  INR good. Continue current dose of Coumadin.  M  ----- Message ----- From: Interface, Lab In [place] Sent: 08/10/2016   3:35 PM To: Lequita Asal, MD

## 2016-08-10 NOTE — Telephone Encounter (Signed)
Called patient and made him aware of results and he voiced and understood to remain on the same dose of coumadin.

## 2016-09-10 ENCOUNTER — Other Ambulatory Visit: Payer: Self-pay | Admitting: Hematology and Oncology

## 2016-09-10 ENCOUNTER — Inpatient Hospital Stay: Payer: Medicare Other | Attending: Hematology and Oncology

## 2016-09-10 ENCOUNTER — Telehealth: Payer: Self-pay | Admitting: *Deleted

## 2016-09-10 DIAGNOSIS — I2782 Chronic pulmonary embolism: Secondary | ICD-10-CM

## 2016-09-10 DIAGNOSIS — Z86711 Personal history of pulmonary embolism: Secondary | ICD-10-CM | POA: Insufficient documentation

## 2016-09-10 LAB — PROTIME-INR
INR: 1.96
Prothrombin Time: 22.6 seconds — ABNORMAL HIGH (ref 11.4–15.2)

## 2016-09-10 NOTE — Telephone Encounter (Signed)
Called patient to inform him per MD that INR is good.  Patient to continue current dosage of Coumadin.  Verified amount of coumadin patient is taking. (5 mg/6 days a week and 7.5/1 day a week.  Patient verbalized understanding.

## 2016-09-10 NOTE — Telephone Encounter (Signed)
-----   Message from Lequita Asal, MD sent at 09/10/2016  3:29 PM EDT ----- Regarding: Please call patient  INR good. Continue current dose of Coumadin.  M  ----- Message ----- From: Interface, Lab In [place] Sent: 09/10/2016   2:59 PM To: Lequita Asal, MD

## 2016-09-12 ENCOUNTER — Emergency Department
Admission: EM | Admit: 2016-09-12 | Discharge: 2016-09-12 | Disposition: A | Discharge status: Home / Self Care | Payer: Medicare Other | Attending: Emergency Medicine | Admitting: Emergency Medicine

## 2016-09-12 ENCOUNTER — Encounter: Payer: Self-pay | Admitting: Emergency Medicine

## 2016-09-12 DIAGNOSIS — X58XXXA Exposure to other specified factors, initial encounter: Secondary | ICD-10-CM | POA: Insufficient documentation

## 2016-09-12 DIAGNOSIS — Y939 Activity, unspecified: Secondary | ICD-10-CM | POA: Insufficient documentation

## 2016-09-12 DIAGNOSIS — Y929 Unspecified place or not applicable: Secondary | ICD-10-CM | POA: Diagnosis not present

## 2016-09-12 DIAGNOSIS — Z85828 Personal history of other malignant neoplasm of skin: Secondary | ICD-10-CM | POA: Diagnosis not present

## 2016-09-12 DIAGNOSIS — Z7901 Long term (current) use of anticoagulants: Secondary | ICD-10-CM | POA: Insufficient documentation

## 2016-09-12 DIAGNOSIS — S51812A Laceration without foreign body of left forearm, initial encounter: Secondary | ICD-10-CM | POA: Insufficient documentation

## 2016-09-12 DIAGNOSIS — S41112A Laceration without foreign body of left upper arm, initial encounter: Secondary | ICD-10-CM

## 2016-09-12 DIAGNOSIS — Y999 Unspecified external cause status: Secondary | ICD-10-CM | POA: Insufficient documentation

## 2016-09-12 DIAGNOSIS — Z79899 Other long term (current) drug therapy: Secondary | ICD-10-CM | POA: Insufficient documentation

## 2016-09-12 MED ORDER — LIDOCAINE-EPINEPHRINE (PF) 1 %-1:200000 IJ SOLN
INTRAMUSCULAR | Status: AC
  Administered 2016-09-12: 30 mL
  Filled 2016-09-12: qty 30

## 2016-09-12 MED ORDER — LIDOCAINE-EPINEPHRINE (PF) 1 %-1:200000 IJ SOLN
30.0000 mL | Freq: Once | INTRAMUSCULAR | Status: AC
  Administered 2016-09-12: 30 mL

## 2016-09-12 NOTE — ED Provider Notes (Signed)
[facility] Emergency Department Provider Note  ____________________________________________  Time seen: Approximately 11:56 AM  I have reviewed the triage vital signs and the nursing notes.   HISTORY  Chief Complaint Laceration    HPI Gregg Thomas is a 82 y.o. male presents for evaluationof laceration to the left forearm prior to arrival.   Past Medical History:  Diagnosis Date  . Heart murmur   . Skin cancer     Patient Active Problem List   Diagnosis Date Noted  . DVT (deep venous thrombosis) ([place]) 07/08/2015  . Pulmonary embolism ([place]) 07/08/2015  . Cellulitis, wound, post-operative 06/03/2014  . Contusion of knee 06/03/2014  . Traumatic hematoma 05/26/2014  . Hematoma of leg 05/13/2014    History reviewed. No pertinent surgical history.  Prior to Admission medications   Medication Sig Start Date End Date Taking? Authorizing Provider  amLODipine (NORVASC) 10 MG tablet Take by mouth.    Historical Provider, MD  Cyanocobalamin (RA VITAMIN B-12 TR) 1000 MCG TBCR Take by mouth.    Historical Provider, MD  enalapril (VASOTEC) 20 MG tablet Take by mouth.    Historical Provider, MD  Grape Seed Extract 50 MG CAPS Take by mouth.    Historical Provider, MD  warfarin (COUMADIN) 2.5 MG tablet TAKE AS DIRECTED. 09/10/16   Lequita Asal, MD  warfarin (COUMADIN) 5 MG tablet TAKE AS DIRECTED. 09/10/16   Lequita Asal, MD    Allergies Review of patient's allergies indicates no known allergies.  Family History  Problem Relation Age of Onset  . Stroke Mother     Social History Social History  Substance Use Topics  . Smoking status: Never Smoker  . Smokeless tobacco: Never Used  . Alcohol use No    Review of Systems Constitutional: No fever/chills Eyes: No visual changes. ENT: No sore throat. Cardiovascular: Denies chest pain. Respiratory: Denies shortness of breath. Gastrointestinal: No abdominal pain.  No nausea, no  vomiting.  No diarrhea.  No constipation. Genitourinary: Negative for dysuria. Musculoskeletal: Negative for back pain. Skin: Left dorsal forearm with 6 cm crescent-shaped laceration Neurological: Negative for headaches, focal weakness or numbness.  10-point ROS otherwise negative.  ____________________________________________   PHYSICAL EXAM: There were no vitals taken for this visit.  VITAL SIGNS: ED Triage Vitals [09/12/16 1111]  Enc Vitals Group     BP      Pulse      Resp      Temp      Temp src      SpO2      Weight      Height      Head Circumference      Peak Flow      Pain Score 5     Pain Loc      Pain Edu?      Excl. in [place]?     Constitutional: Alert and oriented. Well appearing and in no acute distress. Mouth/Throat: Mucous membranes are moist.  Oropharynx non-erythematous. Neck: No stridor.   Cardiovascular: Normal rate, regular rhythm. Grossly normal heart sounds.  Good peripheral circulation. Respiratory: Normal respiratory effort.  No retractions. Lungs CTAB. Gastrointestinal: Soft and nontender. No distention. No abdominal bruits. No CVA tenderness. Musculoskeletal: No lower extremity tenderness nor edema.  No joint effusions. Neurologic:  Normal speech and language. No gross focal neurologic deficits are appreciated. No gait instability. Skin:  6 cm crescent laceration noted to the dorsal aspect of the left arm. Psychiatric: Mood and affect are normal.  Speech and behavior are normal.  ____________________________________________   LABS (all labs ordered are listed, but only abnormal results are displayed)  Labs Reviewed - No data to display ____________________________________________  EKG   ____________________________________________  RADIOLOGY   ____________________________________________   PROCEDURES  Procedure(s) performed: Yes LACERATION REPAIR Performed by: Arlyss Repress Authorized by: Arlyss Repress Consent: Verbal  consent obtained. Risks and benefits: risks, benefits and alternatives were discussed Consent given by: patient Patient identity confirmed: provided demographic data Prepped and Draped in normal sterile fashion Wound explored  Laceration Location: Left forearm  Laceration Length: 6cm  No Foreign Bodies seen or palpated  Anesthesia: local infiltration  Local anesthetic: lidocaine 1% with epinephrine  Anesthetic total: 5 ml  Irrigation method: syringe Amount of cleaning: standard  Skin closure: 4-0 Prolene  Number of sutures: 8  Technique: Simple interrupted  Patient tolerance: Patient tolerated the procedure well with no immediate complications.  Critical Care performed: No  ____________________________________________   INITIAL IMPRESSION / ASSESSMENT AND PLAN / ED COURSE  Pertinent labs & imaging results that were available during my care of the patient were reviewed by me and considered in my medical decision making (see chart for details). Review of the [place] CSRS was performed in accordance of the [place] prior to dispensing any controlled drugs.  Laceration left forearm. Tetanus not needed at this time. Patient follow up in one week for suture removal.  Clinical Course    ____________________________________________   FINAL CLINICAL IMPRESSION(S) / ED DIAGNOSES  Final diagnoses:  Arm laceration, left, initial encounter     This chart was dictated using voice recognition software/Dragon. Despite best efforts to proofread, errors can occur which can change the meaning. Any change was purely unintentional.    Arlyss Repress, PA-C 09/12/16 1255    Harvest Dark, MD 09/12/16 [phone]

## 2016-09-12 NOTE — ED Triage Notes (Signed)
Laceration to left forearm from a counter top

## 2016-09-12 NOTE — ED Notes (Signed)
Laceration covered with non-adherent dressing and wrapped with gauze roll. Secured with tape.

## 2016-09-12 NOTE — ED Notes (Signed)

## 2016-10-08 ENCOUNTER — Other Ambulatory Visit: Payer: Self-pay | Admitting: Hematology and Oncology

## 2016-10-09 ENCOUNTER — Ambulatory Visit (INDEPENDENT_AMBULATORY_CARE_PROVIDER_SITE_OTHER): Payer: Medicare Other

## 2016-10-09 DIAGNOSIS — Z23 Encounter for immunization: Secondary | ICD-10-CM | POA: Diagnosis not present

## 2016-10-10 ENCOUNTER — Telehealth: Payer: Self-pay | Admitting: *Deleted

## 2016-10-10 ENCOUNTER — Inpatient Hospital Stay: Payer: Medicare Other | Attending: Hematology and Oncology

## 2016-10-10 ENCOUNTER — Other Ambulatory Visit: Payer: Self-pay

## 2016-10-10 DIAGNOSIS — Z86718 Personal history of other venous thrombosis and embolism: Secondary | ICD-10-CM | POA: Insufficient documentation

## 2016-10-10 DIAGNOSIS — I2782 Chronic pulmonary embolism: Secondary | ICD-10-CM

## 2016-10-10 LAB — PROTIME-INR
INR: 2.36
Prothrombin Time: 26.2 seconds — ABNORMAL HIGH (ref 11.4–15.2)

## 2016-10-10 NOTE — Telephone Encounter (Signed)
Called patient to inform him that INR is food.  MD recommends continuing same dose of coumadin.

## 2016-10-10 NOTE — Telephone Encounter (Signed)
-----   Message from Lequita Asal, MD sent at 10/10/2016  3:53 PM EDT ----- Regarding: Please call patient  INR good.  Continue same dose of Coumadin  M  ----- Message ----- From: Interface, Lab In [place] Sent: 10/10/2016   3:37 PM To: Lequita Asal, MD

## 2016-10-29 ENCOUNTER — Other Ambulatory Visit: Payer: Self-pay | Admitting: Hematology and Oncology

## 2016-11-12 ENCOUNTER — Inpatient Hospital Stay: Payer: Medicare Other | Attending: Hematology and Oncology | Admitting: Hematology and Oncology

## 2016-11-12 ENCOUNTER — Inpatient Hospital Stay: Payer: Medicare Other

## 2016-11-12 VITALS — BP 110/69 | HR 67 | Temp 95.4°F | Resp 18 | Wt 178.8 lb

## 2016-11-12 DIAGNOSIS — Z7901 Long term (current) use of anticoagulants: Secondary | ICD-10-CM | POA: Diagnosis not present

## 2016-11-12 DIAGNOSIS — R918 Other nonspecific abnormal finding of lung field: Secondary | ICD-10-CM | POA: Diagnosis not present

## 2016-11-12 DIAGNOSIS — I2782 Chronic pulmonary embolism: Secondary | ICD-10-CM

## 2016-11-12 DIAGNOSIS — Z86718 Personal history of other venous thrombosis and embolism: Secondary | ICD-10-CM

## 2016-11-12 DIAGNOSIS — Z86711 Personal history of pulmonary embolism: Secondary | ICD-10-CM | POA: Diagnosis not present

## 2016-11-12 DIAGNOSIS — R011 Cardiac murmur, unspecified: Secondary | ICD-10-CM | POA: Diagnosis not present

## 2016-11-12 DIAGNOSIS — Z79899 Other long term (current) drug therapy: Secondary | ICD-10-CM

## 2016-11-12 DIAGNOSIS — I82501 Chronic embolism and thrombosis of unspecified deep veins of right lower extremity: Secondary | ICD-10-CM

## 2016-11-12 DIAGNOSIS — Z85828 Personal history of other malignant neoplasm of skin: Secondary | ICD-10-CM | POA: Diagnosis not present

## 2016-11-12 LAB — CBC WITH DIFFERENTIAL/PLATELET
Basophils Absolute: 0.1 10*3/uL (ref 0–0.1)
Basophils Relative: 1 %
Eosinophils Absolute: 0.2 10*3/uL (ref 0–0.7)
Eosinophils Relative: 4 %
HCT: 45.1 % (ref 40.0–52.0)
Hemoglobin: 15.3 g/dL (ref 13.0–18.0)
Lymphocytes Relative: 26 %
Lymphs Abs: 1.6 10*3/uL (ref 1.0–3.6)
MCH: 32.8 pg (ref 26.0–34.0)
MCHC: 34 g/dL (ref 32.0–36.0)
MCV: 96.4 fL (ref 80.0–100.0)
Monocytes Absolute: 0.6 10*3/uL (ref 0.2–1.0)
Monocytes Relative: 10 %
Neutro Abs: 3.7 10*3/uL (ref 1.4–6.5)
Neutrophils Relative %: 59 %
Platelets: 219 10*3/uL (ref 150–440)
RBC: 4.67 MIL/uL (ref 4.40–5.90)
RDW: 13.8 % (ref 11.5–14.5)
WBC: 6.2 10*3/uL (ref 3.8–10.6)

## 2016-11-12 LAB — COMPREHENSIVE METABOLIC PANEL
ALT: 18 U/L (ref 17–63)
AST: 22 U/L (ref 15–41)
Albumin: 4.2 g/dL (ref 3.5–5.0)
Alkaline Phosphatase: 82 U/L (ref 38–126)
Anion gap: 8 (ref 5–15)
BUN: 25 mg/dL — ABNORMAL HIGH (ref 6–20)
CO2: 25 mmol/L (ref 22–32)
Calcium: 9.2 mg/dL (ref 8.9–10.3)
Chloride: 105 mmol/L (ref 101–111)
Creatinine, Ser: 1.86 mg/dL — ABNORMAL HIGH (ref 0.61–1.24)
GFR calc Af Amer: 37 mL/min — ABNORMAL LOW (ref >60)
GFR calc non Af Amer: 32 mL/min — ABNORMAL LOW (ref >60)
Glucose, Bld: 109 mg/dL — ABNORMAL HIGH (ref 65–99)
Potassium: 4.4 mmol/L (ref 3.5–5.1)
Sodium: 138 mmol/L (ref 135–145)
Total Bilirubin: 0.7 mg/dL (ref 0.3–1.2)
Total Protein: 7.6 g/dL (ref 6.5–8.1)

## 2016-11-12 LAB — PROTIME-INR
INR: 1.98
Prothrombin Time: 22.8 seconds — ABNORMAL HIGH (ref 11.4–15.2)

## 2016-11-12 NOTE — Progress Notes (Signed)
Gregg Thomas:  11/12/16  Chief Complaint: Gregg Thomas is a 82 y.o. male with a history of left lower extremity deep venous thrombosis (DVT) and pulmonary embolism who is seen for 6 month assessment.  HPI: The patient was last seen in the medical oncology clinic on 05/11/2016.  At that time,  he was doing well on Coumadin 5 mg daily except for Fridays (7.5 mg).  INR was 1.95.  He has had monthly INR checks.  INR has ranged between 1.96 and 2.56.  Last INR was 2.36 on 10/10/2016.  During the interim, he was done well.  He had eyelid surgery on the right side.  He notes some sinus symptoms.  He ate salad yesterday.   Past Medical History:  Diagnosis Date  . Heart murmur   . Skin cancer     No past surgical history on file.  Family History  Problem Relation Age of Onset  . Stroke Mother     Social History:  reports that he has never smoked. He has never used smokeless tobacco. He reports that he does not drink alcohol or use drugs.  He lives in [place].  The patient is accompanied by his wife today.  Allergies: No Known Allergies  Current Medications: Current Outpatient Prescriptions  Medication Sig Dispense Refill  . amLODipine (NORVASC) 10 MG tablet Take by mouth.    . Cyanocobalamin (RA VITAMIN B-12 TR) 1000 MCG TBCR Take by mouth.    . enalapril (VASOTEC) 20 MG tablet Take by mouth.    . Grape Seed Extract 50 MG CAPS Take by mouth.    . warfarin (COUMADIN) 2.5 MG tablet TAKE AS DIRECTED. 30 tablet 0  . warfarin (COUMADIN) 5 MG tablet TAKE AS DIRECTED. 30 tablet 0   No current facility-administered medications for this visit.     Review of Systems:  GENERAL:  Feels good. No fevers or sweats.  Weight down 4 pounds. PERFORMANCE STATUS (ECOG):  0 HEENT:  Interval right eyelid surgery.  Sinus symptoms.  No visual changes, sore throat, mouth sores or tenderness. Lungs: No shortness of breath or cough.  No  hemoptysis. Cardiac:  No chest pain, palpitations, orthopnea, or PND. GI:  No nausea, vomiting, diarrhea, constipation, melena or hematochezia. GU:  No urgency, frequency, dysuria, or hematuria. Musculoskeletal:  No back pain.  No joint pain.  No muscle tenderness. Extremities:  No pain or swelling. Skin:  No rashes or skin changes. Neuro:  No headache, numbness or weakness, balance or coordination issues. Endocrine:  No diabetes, thyroid issues, hot flashes or night sweats. Psych:  No mood changes, depression or anxiety. Pain:  No focal pain. Review of systems:  All other systems reviewed and found to be negative.  Physical Exam: Blood pressure 110/69, pulse 67, temperature (!) 95.4 F (35.2 C), temperature source Tympanic, resp. rate 18, weight 178 lb 12.7 oz (81.1 kg). GENERAL:  Well developed, well nourished, gentleman sitting comfortably in the exam room in no acute distress. MENTAL STATUS:  Alert and oriented to person, place and time. HEAD:  Pearline Cables hair.  Normocephalic, atraumatic, face symmetric, no Cushingoid features. EYES:  Glasses.  Hazel eyes.  Right ectropion.  Pupils equal round and reactive to light and accomodation.  No conjunctivitis or scleral icterus. ENT:  Oropharynx clear without lesion.  Dentures.  Tongue normal. Mucous membranes moist.  RESPIRATORY:  Clear to auscultation without rales, wheezes or rhonchi. CARDIOVASCULAR:  Regular rate and rhythm without murmur,  rub or gallop. ABDOMEN:  Soft, non-tender, with active bowel sounds, and no hepatosplenomegaly.  No masses. SKIN:  s/p Mohs surgery right cheek.  Right ectropion s/p repair.  Multiple scalp lesions..No rashes, ulcers or lesions. EXTREMITIES: No edema, no skin discoloration or tenderness.  No palpable cords. LYMPH NODES: No palpable cervical, supraclavicular, axillary or inguinal adenopathy  NEUROLOGICAL: Unremarkable. PSYCH:  Appropriate.   Appointment on 11/12/2016  Component Date Value Ref Range Status   . WBC 11/12/2016 6.2  3.8 - 10.6 K/uL Final  . RBC 11/12/2016 4.67  4.40 - 5.90 MIL/uL Final  . Hemoglobin 11/12/2016 15.3  13.0 - 18.0 g/dL Final  . HCT 11/12/2016 45.1  40.0 - 52.0 % Final  . MCV 11/12/2016 96.4  80.0 - 100.0 fL Final  . MCH 11/12/2016 32.8  26.0 - 34.0 pg Final  . MCHC 11/12/2016 34.0  32.0 - 36.0 g/dL Final  . RDW 11/12/2016 13.8  11.5 - 14.5 % Final  . Platelets 11/12/2016 219  150 - 440 K/uL Final  . Neutrophils Relative % 11/12/2016 59  % Final  . Neutro Abs 11/12/2016 3.7  1.4 - 6.5 K/uL Final  . Lymphocytes Relative 11/12/2016 26  % Final  . Lymphs Abs 11/12/2016 1.6  1.0 - 3.6 K/uL Final  . Monocytes Relative 11/12/2016 10  % Final  . Monocytes Absolute 11/12/2016 0.6  0.2 - 1.0 K/uL Final  . Eosinophils Relative 11/12/2016 4  % Final  . Eosinophils Absolute 11/12/2016 0.2  0 - 0.7 K/uL Final  . Basophils Relative 11/12/2016 1  % Final  . Basophils Absolute 11/12/2016 0.1  0 - 0.1 K/uL Final  . Sodium 11/12/2016 138  135 - 145 mmol/L Final  . Potassium 11/12/2016 4.4  3.5 - 5.1 mmol/L Final  . Chloride 11/12/2016 105  101 - 111 mmol/L Final  . CO2 11/12/2016 25  22 - 32 mmol/L Final  . Glucose, Bld 11/12/2016 109* 65 - 99 mg/dL Final  . BUN 11/12/2016 25* 6 - 20 mg/dL Final  . Creatinine, Ser 11/12/2016 1.86* 0.61 - 1.24 mg/dL Final  . Calcium 11/12/2016 9.2  8.9 - 10.3 mg/dL Final  . Total Protein 11/12/2016 7.6  6.5 - 8.1 g/dL Final  . Albumin 11/12/2016 4.2  3.5 - 5.0 g/dL Final  . AST 11/12/2016 22  15 - 41 U/L Final  . ALT 11/12/2016 18  17 - 63 U/L Final  . Alkaline Phosphatase 11/12/2016 82  38 - 126 U/L Final  . Total Bilirubin 11/12/2016 0.7  0.3 - 1.2 mg/dL Final  . GFR calc non Af Amer 11/12/2016 32* >60 mL/min Final  . GFR calc Af Amer 11/12/2016 37* >60 mL/min Final   Comment: (NOTE) The eGFR has been calculated using the CKD EPI equation. This calculation has not been validated in all clinical situations. eGFR's persistently <60 mL/min  signify possible Chronic Kidney Disease.   . Anion gap 11/12/2016 8  5 - 15 Final    Assessment:  Gregg Thomas is a 82 y.o. male with a DVT and pulmonary embolism on 06/22/2010 following a long car ride. His lifetime risk of recurrent clot is felt to be 30%. Decision was made to hold Coumadin for procedures then restart Coumadin with a Lovenox bridge.  Hypercoagulable workup revealed the following negative studies: Factor V Leiden, prothrombin gene mutation, beta 2 glycoprotein, protein S free and and total, lupus anticoagulant panel, anti-cardiolipin antibodies. Protein C antigen was slightly low at 58%. Anti-ithrombin III was lower  limits of normal at 73%. Additional negative studies included a CEA, PSA, SPEP and ANA.  He was hospitalized on 05/21/2014 with a thigh hematoma. His anticoagulation was reversed with FFP. He had surgery. Hemoglobin dropped to 10.8.  He is on Coumadin 5 mg daily except for Fridays (7.5 mg). Total weekly Coumadin dose is 37.5 mg.   He has a history of small pulmonary nodules on chest CT. Nodules have been stable per radiology (2011 to 2013). He has never smoked,  He has recently had Mohs surgery of the right cheek for a basal cell carcinoma.  He underwent surgery for right ectropion.  Symptomatically, he denies any bruising or bleeding. Exam is unremarkable.  INR is 1.98.  Creatinine is 1.86 (CrCl 32 ml/min).  Plan: 1.  Labs today:  CBC with diff, CMP, PT/INR. 2.  Continue current dose of Coumadin. 3.  Recheck INR in 2 weeks. 4.  RTC monthly for PT/INR. 5.  RTC in 6 months for MD assessment and labs (CBC with diff, CMP, PT/INR).    Lequita Asal, MD  11/12/2016, 3:00 PM

## 2016-11-12 NOTE — Progress Notes (Signed)
Patient would like to discuss with MD regarding post nasal drip and what kind of nasal spray he can use to help.  Otherwise, no complaints.

## 2016-11-26 ENCOUNTER — Other Ambulatory Visit: Payer: Self-pay | Admitting: Hematology and Oncology

## 2016-12-12 ENCOUNTER — Inpatient Hospital Stay: Payer: Medicare Other | Attending: Hematology and Oncology

## 2016-12-12 DIAGNOSIS — Z86718 Personal history of other venous thrombosis and embolism: Secondary | ICD-10-CM | POA: Insufficient documentation

## 2016-12-13 ENCOUNTER — Inpatient Hospital Stay: Payer: Medicare Other

## 2016-12-13 ENCOUNTER — Other Ambulatory Visit: Payer: Self-pay | Admitting: *Deleted

## 2016-12-13 ENCOUNTER — Telehealth: Payer: Self-pay | Admitting: *Deleted

## 2016-12-13 DIAGNOSIS — I82501 Chronic embolism and thrombosis of unspecified deep veins of right lower extremity: Secondary | ICD-10-CM

## 2016-12-13 DIAGNOSIS — I2782 Chronic pulmonary embolism: Secondary | ICD-10-CM

## 2016-12-13 DIAGNOSIS — Z86718 Personal history of other venous thrombosis and embolism: Secondary | ICD-10-CM | POA: Diagnosis not present

## 2016-12-13 LAB — PROTIME-INR
INR: 2.29
Prothrombin Time: 25.6 seconds — ABNORMAL HIGH (ref 11.4–15.2)

## 2016-12-13 NOTE — Telephone Encounter (Signed)
Patient notified that inr wnl and to stay on current dose of coumadin, next appt 01-10-17 at 1000 for labs, pt informed.

## 2016-12-26 ENCOUNTER — Encounter: Payer: Self-pay | Admitting: Hematology and Oncology

## 2017-01-10 ENCOUNTER — Other Ambulatory Visit: Payer: Medicare Other

## 2017-01-14 ENCOUNTER — Other Ambulatory Visit: Payer: Medicare Other

## 2017-01-14 ENCOUNTER — Inpatient Hospital Stay: Payer: Medicare Other | Attending: Hematology and Oncology

## 2017-01-14 DIAGNOSIS — Z86718 Personal history of other venous thrombosis and embolism: Secondary | ICD-10-CM | POA: Diagnosis not present

## 2017-01-14 DIAGNOSIS — I82501 Chronic embolism and thrombosis of unspecified deep veins of right lower extremity: Secondary | ICD-10-CM

## 2017-01-14 DIAGNOSIS — I2782 Chronic pulmonary embolism: Secondary | ICD-10-CM

## 2017-01-14 LAB — PROTIME-INR
INR: 2.14
Prothrombin Time: 24.3 seconds — ABNORMAL HIGH (ref 11.4–15.2)

## 2017-01-15 ENCOUNTER — Telehealth: Payer: Self-pay | Admitting: *Deleted

## 2017-01-15 NOTE — Telephone Encounter (Signed)
-----   Message from Lequita Asal, MD sent at 01/15/2017  3:37 AM EST ----- Regarding: Please call patient  INR good.  Continue current dose of Coumadin.  INR check in 1 month.  M  ----- Message ----- From: Interface, Lab In [place] Sent: 01/14/2017   5:13 PM To: Lequita Asal, MD

## 2017-01-15 NOTE — Telephone Encounter (Signed)
Called patient to inform him that his INR is good.  He should continue current dose of Coumadin.  Will recheck labs in one month.  Patient verbalized understanding.

## 2017-02-14 ENCOUNTER — Telehealth: Payer: Self-pay | Admitting: *Deleted

## 2017-02-14 ENCOUNTER — Inpatient Hospital Stay: Payer: Medicare Other | Attending: Hematology and Oncology

## 2017-02-14 ENCOUNTER — Other Ambulatory Visit: Payer: Self-pay | Admitting: *Deleted

## 2017-02-14 DIAGNOSIS — Z86718 Personal history of other venous thrombosis and embolism: Secondary | ICD-10-CM | POA: Insufficient documentation

## 2017-02-14 DIAGNOSIS — I2782 Chronic pulmonary embolism: Secondary | ICD-10-CM

## 2017-02-14 DIAGNOSIS — I82501 Chronic embolism and thrombosis of unspecified deep veins of right lower extremity: Secondary | ICD-10-CM

## 2017-02-14 LAB — CBC WITH DIFFERENTIAL/PLATELET
Basophils Absolute: 0 10*3/uL (ref 0–0.1)
Basophils Relative: 1 %
Eosinophils Absolute: 0.1 10*3/uL (ref 0–0.7)
Eosinophils Relative: 2 %
HCT: 43 % (ref 40.0–52.0)
Hemoglobin: 14.9 g/dL (ref 13.0–18.0)
Lymphocytes Relative: 22 %
Lymphs Abs: 1.5 10*3/uL (ref 1.0–3.6)
MCH: 33.3 pg (ref 26.0–34.0)
MCHC: 34.7 g/dL (ref 32.0–36.0)
MCV: 96 fL (ref 80.0–100.0)
Monocytes Absolute: 0.7 10*3/uL (ref 0.2–1.0)
Monocytes Relative: 11 %
Neutro Abs: 4.4 10*3/uL (ref 1.4–6.5)
Neutrophils Relative %: 64 %
Platelets: 230 10*3/uL (ref 150–440)
RBC: 4.48 MIL/uL (ref 4.40–5.90)
RDW: 14.2 % (ref 11.5–14.5)
WBC: 6.7 10*3/uL (ref 3.8–10.6)

## 2017-02-14 LAB — COMPREHENSIVE METABOLIC PANEL
ALT: 22 U/L (ref 17–63)
AST: 26 U/L (ref 15–41)
Albumin: 4.2 g/dL (ref 3.5–5.0)
Alkaline Phosphatase: 83 U/L (ref 38–126)
Anion gap: 4 — ABNORMAL LOW (ref 5–15)
BUN: 24 mg/dL — ABNORMAL HIGH (ref 6–20)
CO2: 23 mmol/L (ref 22–32)
Calcium: 9.1 mg/dL (ref 8.9–10.3)
Chloride: 112 mmol/L — ABNORMAL HIGH (ref 101–111)
Creatinine, Ser: 2.15 mg/dL — ABNORMAL HIGH (ref 0.61–1.24)
GFR calc Af Amer: 31 mL/min — ABNORMAL LOW (ref >60)
GFR calc non Af Amer: 27 mL/min — ABNORMAL LOW (ref >60)
Glucose, Bld: 97 mg/dL (ref 65–99)
Potassium: 4.6 mmol/L (ref 3.5–5.1)
Sodium: 139 mmol/L (ref 135–145)
Total Bilirubin: 0.8 mg/dL (ref 0.3–1.2)
Total Protein: 7.7 g/dL (ref 6.5–8.1)

## 2017-02-14 LAB — PROTIME-INR
INR: 2.22
Prothrombin Time: 25 seconds — ABNORMAL HIGH (ref 11.4–15.2)

## 2017-02-14 NOTE — Telephone Encounter (Signed)
patient informed that his INR was ok and to stay on the current dose of coumadin and we would recheck the value at the next visit, voiced understanding.

## 2017-03-14 ENCOUNTER — Other Ambulatory Visit: Payer: Medicare Other

## 2017-03-18 ENCOUNTER — Inpatient Hospital Stay: Payer: Medicare Other | Attending: Hematology and Oncology

## 2017-03-18 DIAGNOSIS — I2782 Chronic pulmonary embolism: Secondary | ICD-10-CM

## 2017-03-18 DIAGNOSIS — I82501 Chronic embolism and thrombosis of unspecified deep veins of right lower extremity: Secondary | ICD-10-CM

## 2017-03-18 DIAGNOSIS — Z86718 Personal history of other venous thrombosis and embolism: Secondary | ICD-10-CM | POA: Diagnosis not present

## 2017-03-18 LAB — PROTIME-INR
INR: 2.2
Prothrombin Time: 24.8 seconds — ABNORMAL HIGH (ref 11.4–15.2)

## 2017-03-19 ENCOUNTER — Telehealth: Payer: Self-pay | Admitting: *Deleted

## 2017-03-19 NOTE — Telephone Encounter (Signed)
Called patient to inform him that his INR is good.  He should continue current dosage of Coumadin.  Will continue to recheck monthly.

## 2017-03-19 NOTE — Telephone Encounter (Signed)
-----   Message from Lequita Asal, MD sent at 03/18/2017  8:40 PM EDT ----- Regarding: Please call patient  INR good.  Continue current dose of Coumadin.  Continue INR checks monthly.  M  ----- Message ----- From: Interface, Lab In [place] Sent: 03/18/2017   8:12 PM To: Lequita Asal, MD

## 2017-04-16 ENCOUNTER — Inpatient Hospital Stay: Payer: Medicare Other | Attending: Hematology and Oncology

## 2017-04-16 DIAGNOSIS — I2782 Chronic pulmonary embolism: Secondary | ICD-10-CM

## 2017-04-16 DIAGNOSIS — I82501 Chronic embolism and thrombosis of unspecified deep veins of right lower extremity: Secondary | ICD-10-CM

## 2017-04-16 DIAGNOSIS — Z86718 Personal history of other venous thrombosis and embolism: Secondary | ICD-10-CM | POA: Insufficient documentation

## 2017-04-16 LAB — PROTIME-INR
INR: 2
Prothrombin Time: 23 seconds — ABNORMAL HIGH (ref 11.4–15.2)

## 2017-04-17 ENCOUNTER — Telehealth: Payer: Self-pay | Admitting: *Deleted

## 2017-04-17 NOTE — Telephone Encounter (Signed)
Called patient and informed them that his INR was wnl and to continue the current dose of coumadin, voiced understanding.

## 2017-04-22 ENCOUNTER — Other Ambulatory Visit: Payer: Self-pay | Admitting: Hematology and Oncology

## 2017-04-26 ENCOUNTER — Encounter: Payer: Medicare Other | Attending: Physician Assistant | Admitting: Physician Assistant

## 2017-04-26 DIAGNOSIS — Z86718 Personal history of other venous thrombosis and embolism: Secondary | ICD-10-CM | POA: Insufficient documentation

## 2017-04-26 DIAGNOSIS — I872 Venous insufficiency (chronic) (peripheral): Secondary | ICD-10-CM | POA: Insufficient documentation

## 2017-04-26 DIAGNOSIS — Z7901 Long term (current) use of anticoagulants: Secondary | ICD-10-CM | POA: Insufficient documentation

## 2017-04-26 DIAGNOSIS — Z86711 Personal history of pulmonary embolism: Secondary | ICD-10-CM | POA: Insufficient documentation

## 2017-04-26 DIAGNOSIS — I10 Essential (primary) hypertension: Secondary | ICD-10-CM | POA: Diagnosis not present

## 2017-04-26 DIAGNOSIS — L97821 Non-pressure chronic ulcer of other part of left lower leg limited to breakdown of skin: Secondary | ICD-10-CM | POA: Insufficient documentation

## 2017-04-26 DIAGNOSIS — M1990 Unspecified osteoarthritis, unspecified site: Secondary | ICD-10-CM | POA: Diagnosis not present

## 2017-04-27 NOTE — Progress Notes (Addendum)
TRUONG, DELCASTILLO ([id number]) Visit Report for 04/26/2017 Allergy List Details Patient Name: Gregg Thomas, Gregg Thomas Date of Service: 04/26/2017 12:45 PM Medical Record Number: [id number] Patient Account Number: [id number] Date of Birth/Sex: Sep 17, 1931 (83 y.o. Male) Treating RN: Baruch Gouty, RN, BSN, [facility] Primary Care Daizee Firmin: Golden Pop Other Clinician: Referring Shadana Pry: Golden Pop Treating Deadra Diggins/Extender: Melburn Hake, HOYT Weeks in Treatment: 0 Allergies Inactive Allergies NKA Allergy Notes Electronic Signature(s) Signed: 04/26/2017 12:36:34 PM By: Regan Lemming BSN, RN Entered By: Regan Lemming on 04/26/2017 12:36:34 Gregg Thomas ([id number]) -------------------------------------------------------------------------------- Arrival Information Details Patient Name: Gregg Thomas Date of Service: 04/26/2017 12:45 PM Medical Record Number: [id number] Patient Account Number: [id number] Date of Birth/Sex: [birth year] (83 y.o. Male) Treating RN: Baruch Gouty, RN, BSN, [place]va Har[place] Primary Care Clive Parcel: Golden Pop Other Clinician: Referring Leanza Shepperson: Golden Pop Treating Molly Savarino/Extender: Melburn Hake, HOYT Weeks in Treatment: 0 Visit Information Patient Arrived: Ambulatory Arrival Time: 12:46 Accompanied By: wife Transfer Assistance: None Patient Identification Verified: Yes Secondary Verification Process Yes Completed: Patient Requires Transmission- No Based Precautions: Patient Has Alerts: Yes Patient Alerts: Patient on Blood Thinner History Since Last Visit All ordered tests and consults were completed: No Added or deleted any medications: No Any new allergies or adverse reactions: No Had a fall or experienced change in activities of daily living that may affect risk of falls: No Signs or symptoms of abuse/neglect since last visito No Hospitalized since last visit: No Has Dressing in Place as Prescribed: Yes Electronic Signature(s) Signed: 04/26/2017 4:36:00 PM By:  Regan Lemming BSN, RN Entered By: Regan Lemming on 04/26/2017 12:48:46 Gregg Thomas ([id number]) -------------------------------------------------------------------------------- Clinic Level of Care Assessment Details Patient Name: Gregg Thomas Date of Service: 04/26/2017 12:45 PM Medical Record Number: [id number] Patient Account Number: [id number] Date of Birth/Sex: 10/22/1931 (83 y.o. Male) Treating RN: Baruch Gouty, RN, BSN, [place] Primary Care Connell Bognar: Jeananne Rama, Elta [place] Other Clinician: Referring Karl Knarr: Golden Pop Treating Arissa Fagin/Extender: STONE III, HOYT Weeks in Treatment: 0 Clinic Level of Care Assessment Items TOOL 4 Quantity Score []  - Use when only an EandM is performed on FOLLOW-UP visit 0 ASSESSMENTS - Nursing Assessment / Reassessment X - Reassessment of Co-morbidities (includes updates in patient status) 1 10 X - Reassessment of Adherence to Treatment Plan 1 5 ASSESSMENTS - Wound and Skin Assessment / Reassessment []  - Simple Wound Assessment / Reassessment - one wound 0 []  - Complex Wound Assessment / Reassessment - multiple wounds 0 X - Dermatologic / Skin Assessment (not related to wound area) 1 10 ASSESSMENTS - Focused Assessment []  - Circumferential Edema Measurements - multi extremities 0 []  - Nutritional Assessment / Counseling / Intervention 0 X - Lower Extremity Assessment (monofilament, tuning fork, pulses) 1 5 []  - Peripheral Arterial Disease Assessment (using hand held doppler) 0 ASSESSMENTS - Ostomy and/or Continence Assessment and Care []  - Incontinence Assessment and Management 0 []  - Ostomy Care Assessment and Management (repouching, etc.) 0 PROCESS - Coordination of Care X - Simple Patient / Family Education for ongoing care 1 15 []  - Complex (extensive) Patient / Family Education for ongoing care 0 []  - Staff obtains Programmer, systems, Records, Test Results / Process Orders 0 []  - Staff telephones HHA, Nursing Homes / Clarify orders / etc 0 []  -  Routine Transfer to another Facility (non-emergent condition) 0 REGINOLD, BEALE. ([id number]) []  - Routine Hospital Admission (non-emergent condition) 0 X - New Admissions / Biomedical engineer / Ordering NPWT, Apligraf, etc. 1 15 []  - Emergency Hospital Admission (emergent condition) 0 []  - Simple Discharge Coordination 0 []  -  Complex (extensive) Discharge Coordination 0 PROCESS - Special Needs []  - Pediatric / Minor Patient Management 0 []  - Isolation Patient Management 0 []  - Hearing / Language / Visual special needs 0 []  - Assessment of Community assistance (transportation, D/C planning, etc.) 0 []  - Additional assistance / Altered mentation 0 []  - Support Surface(s) Assessment (bed, cushion, seat, etc.) 0 INTERVENTIONS - Wound Cleansing / Measurement []  - Simple Wound Cleansing - one wound 0 []  - Complex Wound Cleansing - multiple wounds 0 X - Wound Imaging (photographs - any number of wounds) 1 5 []  - Wound Tracing (instead of photographs) 0 []  - Simple Wound Measurement - one wound 0 []  - Complex Wound Measurement - multiple wounds 0 INTERVENTIONS - Wound Dressings []  - Small Wound Dressing one or multiple wounds 0 []  - Medium Wound Dressing one or multiple wounds 0 []  - Large Wound Dressing one or multiple wounds 0 []  - Application of Medications - topical 0 []  - Application of Medications - injection 0 INTERVENTIONS - Miscellaneous []  - External ear exam 0 KEETON, KASSEBAUM E. ([id number]) []  - Specimen Collection (cultures, biopsies, blood, body fluids, etc.) 0 []  - Specimen(s) / Culture(s) sent or taken to Lab for analysis 0 []  - Patient Transfer (multiple staff / Harrel Lemon Lift / Similar devices) 0 []  - Simple Staple / Suture removal (25 or less) 0 []  - Complex Staple / Suture removal (26 or more) 0 []  - Hypo / Hyperglycemic Management (close monitor of Blood Glucose) 0 X - Ankle / Brachial Index (ABI) - do not check if billed separately 1 15 X - Vital Signs 1  5 Has the patient been seen at the hospital within the last three years: Yes Total Score: 85 Level Of Care: New/Established - Level 3 Electronic Signature(s) Signed: 04/26/2017 4:36:00 PM By: Regan Lemming BSN, RN Entered By: Regan Lemming on 04/26/2017 13:30:25 Gregg Thomas ([id number]) -------------------------------------------------------------------------------- Encounter Discharge Information Details Patient Name: Gregg Thomas Date of Service: 04/26/2017 12:45 PM Medical Record Number: [id number] Patient Account Number: [id number] Date of Birth/Sex: [birth year] (83 y.o. Male) Treating RN: Baruch Gouty, RN, BSN, [place] Primary Care Devine Dant: Golden Pop Other Clinician: Referring Kinslie Hove: Golden Pop Treating Carely Nappier/Extender: Melburn Hake, HOYT Weeks in Treatment: 0 Encounter Discharge Information Items Discharge Pain Level: 0 Discharge Condition: Stable Ambulatory Status: Ambulatory Discharge Destination: Home Transportation: Private Auto Accompanied By: wife Schedule Follow-up Appointment: No Medication Reconciliation completed and provided to Patient/Care No Dezra Mandella: Provided on Clinical Summary of Care: 04/26/2017 Form Type Recipient Paper Patient JW Electronic Signature(s) Signed: 04/26/2017 1:37:57 PM By: Ruthine Dose Entered By: Ruthine Dose on 04/26/2017 13:37:57 Gregg Thomas ([id number]) -------------------------------------------------------------------------------- Lower Extremity Assessment Details Patient Name: Gregg Thomas Date of Service: 04/26/2017 12:45 PM Medical Record Number: [id number] Patient Account Number: [id number] Date of Birth/Sex: September 04, 1931 (83 y.o. Male) Treating RN: Baruch Gouty, RN, BSN, [employer] Primary Care Rhen Dossantos: Golden Pop Other Clinician: Referring Leahna Hewson: Golden Pop Treating Ayva Veilleux/Extender: STONE III, HOYT Weeks in Treatment: 0 Edema Assessment Assessed: [Left: No] [Right: No] E[Left: dema] [Right:  :] Calf Left: Right: Point of Measurement: 39 cm From Medial Instep 37 cm cm Ankle Left: Right: Point of Measurement: 9 cm From Medial Instep 26.5 cm cm Vascular Assessment Pulses: Dorsalis Pedis Palpable: [Left:Yes] Posterior Tibial Extremity colors, hair growth, and conditions: Extremity Color: [Left:Hyperpigmented] Hair Growth on Extremity: [Left:Yes] Temperature of Extremity: [Left:Warm] Capillary Refill: [Left:< 3 seconds] Blood Pressure: Brachial: [Left:119] Dorsalis Pedis: 115 [Left:Dorsalis Pedis:] Ankle: Posterior Tibial: [Left:Posterior Tibial: 0.97] Toe Nail Assessment  Left: Right: Thick: Yes Discolored: Yes Deformed: No Improper Length and Hygiene: No Electronic Signature(s) Signed: 04/26/2017 4:36:00 PM By: Regan Lemming BSN, RN GEROD, CALIGIURI ([id number]) Entered By: Regan Lemming on 04/26/2017 13:08:42 EINER, MEALS ([id number]) -------------------------------------------------------------------------------- Multi Wound Chart Details Patient Name: Gregg Thomas Date of Service: 04/26/2017 12:45 PM Medical Record Number: [id number] Patient Account Number: [id number] Date of Birth/Sex: Jan 13, 1931 (83 y.o. Male) Treating RN: Baruch Gouty, RN, BSN, [place] Primary Care Jery Hollern: Golden Pop Other Clinician: Referring Terriona Horlacher: Golden Pop Treating Christana Angelica/Extender: STONE III, HOYT Weeks in Treatment: 0 Vital Signs Height(in): Pulse(bpm): 63 Weight(lbs): 177 Blood Pressure 119/64 (mmHg): Body Mass Index(BMI): Temperature(F): 97.7 Respiratory Rate 17 (breaths/min): Photos: [2:No Photos] [3:No Photos] [N/A:N/A] Wound Location: [2:Left, Lateral, Posterior Lower Leg] [3:Left, Posterior Lower Leg N/A] Wounding Event: [2:Gradually Appeared] [3:Shear/Friction] [N/A:N/A] Primary Etiology: [2:Venous Leg Ulcer] [3:Venous Leg Ulcer] [N/A:N/A] Date Acquired: [2:02/28/2017] [3:02/28/2017] [N/A:N/A] Weeks of Treatment: [2:0] [3:0] [N/A:N/A] Wound  Status: [2:Healed - Epithelialized] [3:Healed - Epithelialized] [N/A:N/A] Measurements L x W x D 0x0x0 [3:0x0x0] [N/A:N/A] (cm) Area (cm) : [2:0] [3:0] [N/A:N/A] Volume (cm) : [2:0] [3:0] [N/A:N/A] Periwound Skin Texture: No Abnormalities Noted [3:No Abnormalities Noted] [N/A:N/A] Periwound Skin [2:No Abnormalities Noted] [3:No Abnormalities Noted] [N/A:N/A] Moisture: Periwound Skin Color: No Abnormalities Noted [3:No Abnormalities Noted] [N/A:N/A] Tenderness on [2:No] [3:No] [N/A:N/A] Treatment Notes Electronic Signature(s) Signed: 04/26/2017 4:36:00 PM By: Regan Lemming BSN, RN Entered By: Regan Lemming on 04/26/2017 13:29:20 Gregg Thomas ([id number]) -------------------------------------------------------------------------------- Pain Assessment Details Patient Name: Gregg Thomas Date of Service: 04/26/2017 12:45 PM Medical Record Number: [id number] Patient Account Number: [id number] Date of Birth/Sex: June 10, 1931 (83 y.o. Male) Treating RN: Baruch Gouty, RN, BSN, [place] Primary Care Tkai Large: Golden Pop Other Clinician: Referring Annaya Bangert: Golden Pop Treating Caprisha Bridgett/Extender: STONE III, HOYT Weeks in Treatment: 0 Active Problems Location of Pain Severity and Description of Pain Patient Has Paino No Site Locations With Dressing Change: No Pain Management and Medication Current Pain Management: Electronic Signature(s) Signed: 04/26/2017 4:36:00 PM By: Regan Lemming BSN, RN Entered By: Regan Lemming on 04/26/2017 12:50:58 Gregg Thomas ([id number]) -------------------------------------------------------------------------------- Patient/Caregiver Education Details Patient Name: Gregg Thomas Date of Service: 04/26/2017 12:45 PM Medical Record Number: [id number] Patient Account Number: [id number] Date of Birth/Gender: 09-11-1931 (83 y.o. Male) Treating RN: Baruch Gouty, RN, BSN, [place] Primary Care Physician: Golden Pop Other Clinician: Referring Physician:  Golden Pop Treating Physician/Extender: Sharalyn Ink in Treatment: 0 Education Assessment Education Provided To: Patient Education Topics Provided Venous: Methods: Explain/Verbal Responses: State content correctly Electronic Signature(s) Signed: 04/26/2017 4:36:00 PM By: Regan Lemming BSN, RN Entered By: Regan Lemming on 04/26/2017 13:37:53 Gregg Thomas ([id number]) -------------------------------------------------------------------------------- Wound Assessment Details Patient Name: Gregg Thomas Date of Service: 04/26/2017 12:45 PM Medical Record Number: [id number] Patient Account Number: [id number] Date of Birth/Sex: April 20, 1931 (83 y.o. Male) Treating RN: Baruch Gouty, RN, BSN, [place] Primary Care Kendrah Lovern: Golden Pop Other Clinician: Referring Fatuma Dowers: Golden Pop Treating Kirtis Challis/Extender: STONE III, HOYT Weeks in Treatment: 0 Wound Status Wound Number: 2 Primary Etiology: Venous Leg Ulcer Wound Location: Left, Lateral, Posterior Lower Wound Status: Healed - Epithelialized Leg Wounding Event: Gradually Appeared Date Acquired: 02/28/2017 Weeks Of Treatment: 0 Clustered Wound: No Photos Photo Uploaded By: Regan Lemming on 04/26/2017 16:27:50 Wound Measurements Length: (cm) Width: (cm) Depth: (cm) Area: (cm) Volume: (cm) 0 % Reduction in Area: 0 % Reduction in Volume: 0 0 0 Periwound Skin Texture Texture Color No Abnormalities Noted: No No Abnormalities Noted: No Moisture No Abnormalities Noted: No Electronic Signature(s) Signed: 04/26/2017 4:36:00  PM By: Regan Lemming BSN, RN Entered By: Regan Lemming on 04/26/2017 13:28:30 JDEN, WANT ([id number]) MARINUS, EICHER ([id number]) -------------------------------------------------------------------------------- Wound Assessment Details Patient Name: Gregg Thomas Date of Service: 04/26/2017 12:45 PM Medical Record Number: [id number] Patient Account Number: [id number] Date  of Birth/Sex: 07/31/1931 (83 y.o. Male) Treating RN: Baruch Gouty, RN, BSN, [place] Primary Care Jawuan Robb: Golden Pop Other Clinician: Referring Saadiya Wilfong: Golden Pop Treating Annetta Deiss/Extender: Melburn Hake, HOYT Weeks in Treatment: 0 Wound Status Wound Number: 3 Primary Etiology: Venous Leg Ulcer Wound Location: Left, Posterior Lower Leg Wound Status: Healed - Epithelialized Wounding Event: Shear/Friction Date Acquired: 02/28/2017 Weeks Of Treatment: 0 Clustered Wound: No Photos Photo Uploaded By: Regan Lemming on 04/26/2017 16:27:51 Wound Measurements Length: (cm) Width: (cm) Depth: (cm) Area: (cm) Volume: (cm) 0 % Reduction in Area: 0 % Reduction in Volume: 0 0 0 Periwound Skin Texture Texture Color No Abnormalities Noted: No No Abnormalities Noted: No Moisture No Abnormalities Noted: No Electronic Signature(s) Signed: 04/26/2017 4:36:00 PM By: Regan Lemming BSN, RN Entered By: Regan Lemming on 04/26/2017 13:28:30 Gregg Thomas ([id number]) -------------------------------------------------------------------------------- Vitals Details Patient Name: Gregg Thomas Date of Service: 04/26/2017 12:45 PM Medical Record Number: [id number] Patient Account Number: [id number] Date of Birth/Sex: 04-30-1931 (83 y.o. Male) Treating RN: Baruch Gouty, RN, BSN, [place] Primary Care Alaila Pillard: Golden Pop Other Clinician: Referring Lesette Frary: Golden Pop Treating Braylin Formby/Extender: STONE III, HOYT Weeks in Treatment: 0 Vital Signs Time Taken: 12:51 Temperature (F): 97.7 Source: Stated Pulse (bpm): 63 Weight (lbs): 177 Respiratory Rate (breaths/min): 17 Source: Measured Blood Pressure (mmHg): 119/64 Reference Range: 80 - 120 mg / dl Electronic Signature(s) Signed: 04/26/2017 4:36:00 PM By: Regan Lemming BSN, RN Entered By: Regan Lemming on 04/26/2017 12:52:31

## 2017-04-27 NOTE — Progress Notes (Signed)
THEUS, ESPIN ([id number]) Visit Report for 04/26/2017 Abuse/Suicide Risk Screen Details Patient Name: Gregg Thomas, Gregg Thomas Date of Service: 04/26/2017 12:45 PM Medical Record Number: [id number] Patient Account Number: [id number] Date of Birth/Sex: October 09, 1931 (83 y.o. Male) Treating RN: Baruch Gouty, RN, BSN, [facility] Primary Care Antionette Luster: Golden Pop Other Clinician: Referring Ella Guillotte: Golden Pop Treating Saleh Ulbrich/Extender: Melburn Hake, HOYT Weeks in Treatment: 0 Abuse/Suicide Risk Screen Items Answer ABUSE/SUICIDE RISK SCREEN: Has anyone close to you tried to hurt or harm you recentlyo No Do you feel uncomfortable with anyone in your familyo No Has anyone forced you do things that you didnot want to doo No Do you have any thoughts of harming yourselfo No Patient displays signs or symptoms of abuse and/or neglect. No Electronic Signature(s) Signed: 04/26/2017 12:37:52 PM By: Regan Lemming BSN, RN Entered By: Regan Lemming on 04/26/2017 12:37:51 Gregg Thomas ([id number]) -------------------------------------------------------------------------------- Activities of Daily Living Details Patient Name: Gregg Thomas Date of Service: 04/26/2017 12:45 PM Medical Record Number: [id number] Patient Account Number: [id number] Date of Birth/Sex: 1931-05-12 (83 y.o. Male) Treating RN: Baruch Gouty, RN, BSN, Velva Harman Primary Care Norlan Rann: Golden Pop Other Clinician: Referring Amiliana Foutz: Golden Pop Treating Chelsee Hosie/Extender: Melburn Hake, HOYT Weeks in Treatment: 0 Activities of Daily Living Items Answer Activities of Daily Living (Please select one for each item) Drive Automobile Completely Able Take Medications Completely Able Use Telephone Completely Able Care for Appearance Completely Able Use Toilet Completely Able Bath / Shower Completely Able Dress Self Completely Able Feed Self Completely Able Walk Completely Able Get In / Out Bed Completely Able Housework Completely  Able Prepare Meals Completely [place] for Self Completely Able Electronic Signature(s) Signed: 04/26/2017 12:38:23 PM By: Regan Lemming BSN, RN Entered By: Regan Lemming on 04/26/2017 12:38:22 Gregg Thomas ([id number]) -------------------------------------------------------------------------------- Education Assessment Details Patient Name: Gregg Thomas Date of Service: 04/26/2017 12:45 PM Medical Record Number: [id number] Patient Account Number: [id number] Date of Birth/Sex: November 18, 1931 (83 y.o. Male) Treating RN: Baruch Gouty, RN, BSN, [employer] Primary Care Arma Reining: Golden Pop Other Clinician: Referring Olen Eaves: Golden Pop Treating Icker Swigert/Extender: Melburn Hake, HOYT Weeks in Treatment: 0 Primary Learner Assessed: Patient Learning Preferences/Education Level/Primary Language Learning Preference: Explanation Highest Education Level: High School Preferred Language: English Cognitive Barrier Assessment/Beliefs Language Barrier: No Physical Barrier Assessment Impaired Vision: No Impaired Hearing: No Decreased Hand dexterity: No Knowledge/Comprehension Assessment Knowledge Level: Medium Comprehension Level: Medium Ability to understand written Medium instructions: Ability to understand verbal Medium instructions: Motivation Assessment Anxiety Level: Calm Cooperation: Cooperative Education Importance: Acknowledges Need Interest in Health Problems: Asks Questions Perception: Coherent Willingness to Engage in Self- Medium Management Activities: Readiness to Engage in Self- Medium Management Activities: Electronic Signature(s) Signed: 04/26/2017 12:39:13 PM By: Regan Lemming BSN, RN Entered By: Regan Lemming on 04/26/2017 12:39:12 Gregg Thomas ([id number]) -------------------------------------------------------------------------------- Fall Risk Assessment Details Patient Name: Gregg Thomas Date of Service: 04/26/2017  12:45 PM Medical Record Number: [id number] Patient Account Number: [id number] Date of Birth/Sex: 04/22/1931 (83 y.o. Male) Treating RN: Baruch Gouty, RN, BSN, [place] Primary Care Liahm Grivas: Golden Pop Other Clinician: Referring Davyn Morandi: Golden Pop Treating Emiel Kielty/Extender: Melburn Hake, HOYT Weeks in Treatment: 0 Fall Risk Assessment Items Have you had 2 or more falls in the last 12 monthso 0 Yes Have you had any fall that resulted in injury in the last 12 monthso 0 Yes FALL RISK ASSESSMENT: History of falling - immediate or within 3 months 25 Yes Secondary diagnosis 15 Yes Ambulatory aid None/bed rest/wheelchair/nurse 0 No Crutches/cane/walker 15 Yes Furniture 0 No IV  Access/Saline Lock 0 No Gait/Training Normal/bed rest/immobile 0 No Weak 10 Yes Impaired 0 No Mental Status Oriented to own ability 0 Yes Electronic Signature(s) Signed: 04/26/2017 12:40:08 PM By: Regan Lemming BSN, RN Entered By: Regan Lemming on 04/26/2017 12:40:08 Gregg Thomas ([id number]) -------------------------------------------------------------------------------- Foot Assessment Details Patient Name: Gregg Thomas Date of Service: 04/26/2017 12:45 PM Medical Record Number: [id number] Patient Account Number: [id number] Date of Birth/Sex: [birth year] (83 y.o. Male) Treating RN: Baruch Gouty, RN, BSN, [place] Primary Care Tilley Faeth: Golden Pop Other Clinician: Referring Naysha Sholl: Golden Pop Treating Marquerite Forsman/Extender: Melburn Hake, HOYT Weeks in Treatment: 0 Foot Assessment Items Site Locations + = Sensation present, - = Sensation absent, C = Callus, U = Ulcer R = Redness, W = Warmth, M = Maceration, PU = Pre-ulcerative lesion F = Fissure, S = Swelling, D = Dryness Assessment Right: Left: Other Deformity: No No Prior Foot Ulcer: No No Prior Amputation: No No Charcot Joint: No No Ambulatory Status: Ambulatory With Help Assistance Device: Cane Gait: Administrator, arts) Signed:  04/26/2017 12:40:36 PM By: Regan Lemming BSN, RN Entered By: Regan Lemming on 04/26/2017 12:40:36 Gregg Thomas ([id number]) -------------------------------------------------------------------------------- Nutrition Risk Assessment Details Patient Name: Gregg Thomas Date of Service: 04/26/2017 12:45 PM Medical Record Number: [id number] Patient Account Number: [id number] Date of Birth/Sex: 1931-07-15 (83 y.o. Male) Treating RN: Baruch Gouty, RN, BSN, [place] Primary Care Jemell Town: Golden Pop Other Clinician: Referring Jessyka [place]: Golden Pop Treating Quasean Frye/Extender: STONE III, HOYT Weeks in Treatment: 0 Height (in): 67 Weight (lbs): 177 Body Mass Index (BMI): 27.7 Nutrition Risk Assessment Items NUTRITION RISK SCREEN: I have an illness or condition that made me change the kind and/or 0 No amount of food I eat I eat fewer than two meals per day 0 No I eat few fruits and vegetables, or milk products 0 No I have three or more drinks of beer, liquor or wine almost every day 0 No I have tooth or mouth problems that make it hard for me to eat 0 No I don't always have enough money to buy the food I need 0 No I eat alone most of the time 0 No I take three or more different prescribed or over-the-counter drugs a 0 No day Without wanting to, I have lost or gained 10 pounds in the last six 0 No months I am not always physically able to shop, cook and/or feed myself 0 No Nutrition Protocols Good Risk Protocol 0 No interventions needed Moderate Risk Protocol Electronic Signature(s) Signed: 04/26/2017 12:40:19 PM By: Regan Lemming BSN, RN Entered By: Regan Lemming on 04/26/2017 12:40:19

## 2017-04-28 NOTE — Progress Notes (Signed)
NATION, CRADLE ([id number]) Visit Report for 04/26/2017 Chief Complaint Document Details Patient Name: Gregg Thomas, Gregg Thomas Date of Service: 04/26/2017 12:45 PM Medical Record Number: [id number] Patient Account Number: [id number] Date of Birth/Sex: Oct 11, 1931 (83 y.o. Male) Treating RN: Baruch Gouty, RN, BSN, Velva Harman Primary Care Provider: Golden Pop Other Clinician: Referring Provider: Golden Pop Treating Provider/Extender: Melburn Hake, Wilbert Hayashi Weeks in Treatment: 0 Information Obtained from: Patient Chief Complaint Left Calf wound Electronic Signature(s) Signed: 04/26/2017 5:05:38 PM By: Worthy Keeler PA-C Entered By: Worthy Keeler on 04/26/2017 13:38:31 Gregg Thomas ([id number]) -------------------------------------------------------------------------------- HPI Details Patient Name: Gregg Thomas Date of Service: 04/26/2017 12:45 PM Medical Record Number: [id number] Patient Account Number: [id number] Date of Birth/Sex: 1931-06-04 (83 y.o. 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 HE was playing softball approximately 1 month ago when he fell. The fall resulted in a large hematoma which was drained in the operating room at the time (was closed over a drain). His post-op course was complicated by dehiscence and wound infection. He has been taking care of the wound at home by washing it and placing a dry gauze on it. He has also intermittently used Neosporin on it. He was initially on a 10 day course of Keflex right after his drainage operation. He denies any fevers at this time. He denies any history of diabetes or vascular problems or any  previous history of difficulty in wound healing. The patient returns for f/u today for his right thigh wound. He has been very compliant with home dressing changes. Denies fevers. Denies any pain. He has been back to doing normal activities. Readmission: 04/26/17 patient presents today for evaluation concerning an injury to his left calf which occurred about six weeks prior to presentation here at the wound center today. He notes that he was helping with batting practice when while not paying attention he turned to do something else and the ball was struck hitting him in the back of the calf on the left. It hurt but he did not initially think much about it until he went to sit down and someone else noted blood on his sock. Since that time his wife has been helping care for this and has done an excellent job and in fact the wound appears healed on evaluation today although very newly so. He has no signs or evidence of infection at this point. The only thing that they have not done that I would have recommended is for him to continue with his compression stockings which he did not do since he had an open wound. He thought he was not supposed to. Electronic Signature(s) Signed: 04/26/2017 5:05:38 PM By: Worthy Keeler PA-C Entered By: Worthy Keeler on 04/26/2017 13:41:27 Gregg Thomas, Gregg Thomas ([id number]) -------------------------------------------------------------------------------- Physical Exam Details Patient Name: Gregg Thomas Date of Service: 04/26/2017 12:45 PM Medical Record Number: [id number] Patient Account Number: [id number] Date of Birth/Sex: 05-06-1931 (83 y.o. Male) Treating RN: Baruch Gouty, RN, BSN, Velva Harman Primary Care Provider: Golden Pop Other Clinician: Referring Provider: Golden Pop Treating Provider/Extender: STONE III, Jazzmyne Rasnick Weeks in Treatment: 0 Constitutional sitting or standing blood pressure is within target range for patient.. pulse regular and within target  range for patient.Marland Kitchen respirations regular,  non-labored and within target range for patient.Marland Kitchen temperature within target range for patient.. Well-nourished and well-hydrated in no acute distress. Ears, Nose, Mouth, and Throat no gross abnormality of ear auricles or external auditory canals. normal hearing noted during conversation. mucus membranes moist. Respiratory normal breathing without difficulty. clear to auscultation bilaterally. Cardiovascular regular rate and rhythm with normal S1, S2. 1+ dorsalis pedis/posterior tibialis pulses. Bilateral lower extremity edema noted. Gastrointestinal (GI) soft, non-tender, non-distended, +BS. Musculoskeletal normal gait and posture. Psychiatric this patient is able to make decisions and demonstrates good insight into disease process. Alert and Oriented x 3. pleasant and cooperative. Electronic Signature(s) Signed: 04/26/2017 5:05:38 PM By: Worthy Keeler PA-C Entered By: Worthy Keeler on 04/26/2017 13:43:38 Gregg Thomas ([id number]) -------------------------------------------------------------------------------- Physician Orders Details Patient Name: Gregg Thomas Date of Service: 04/26/2017 12:45 PM Medical Record Number: [id number] Patient Account Number: [id number] Date of Birth/Sex: May 23, 1931 (83 y.o. Male) Treating RN: Baruch Gouty, RN, BSN, Velva Harman Primary Care Provider: Golden Pop Other Clinician: Referring Provider: Golden Pop Treating Provider/Extender: Melburn Hake, Omer Puccinelli Weeks in Treatment: 0 Verbal / Phone Orders: No Diagnosis Coding Discharge From [facility] Services o Discharge from [place] Completed. Consult only Electronic Signature(s) Signed: 04/26/2017 4:36:00 PM By: Regan Lemming BSN, RN Signed: 04/26/2017 5:05:38 PM By: Worthy Keeler PA-C Entered By: Regan Lemming on 04/26/2017 13:29:59 Gregg Thomas  ([id number]) -------------------------------------------------------------------------------- Problem List Details Patient Name: Gregg Thomas Date of Service: 04/26/2017 12:45 PM Medical Record Number: [id number] Patient Account Number: [id number] Date of Birth/Sex: August 25, 1931 (83 y.o. 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 HE was playing softball approximately 1 month ago when he fell. The fall resulted in a large hematoma which was drained in the operating room at the time (was closed over a drain). His post-op course was complicated by dehiscence and wound infection.  He has been taking care of the wound at home by washing it and placing a dry gauze on it. He has also intermittently used Neosporin on it. He was initially on a 10 day course of Keflex right after his drainage operation. He denies any fevers at this time. He denies any history of diabetes or vascular problems or any previous history of difficulty in wound healing. The patient returns for f/u today for his right thigh wound. He has been very compliant with home dressing changes. Denies fevers. Denies any pain. He has been back to doing normal activities. Readmission: 04/26/17 patient presents  today for evaluation concerning an injury to his left calf which occurred about six weeks prior to presentation here at the wound center today. He notes that he was helping with batting practice when while not paying attention he turned to do something else and the ball was struck hitting him in the back of the calf on the left. It hurt but he did not initially think much about it until he went to sit down and someone else noted blood on his sock. Since that time his wife has been helping care for this and has done an excellent job and in fact the wound appears healed on evaluation today although very newly so. He has no signs or evidence of infection at this point. The only thing that they have not done that I would have recommended is for him to continue with his compression stockings which he did not do since he had an open wound. He thought he was not supposed to. Wound History Patient presents with 2 open wounds that have been present for approximately 6 weeks. Patient has been treating wounds in the following manner: bandaid. Laboratory tests have not been performed in the last month. Patient reportedly has not tested positive for an antibiotic resistant organism. Patient reportedly has not tested positive for osteomyelitis. Patient reportedly has not had testing performed to evaluate circulation in  the legs. Patient experiences the following problems associated with their wounds: infection, swelling. Patient History Information obtained from Patient. Gregg Thomas, Gregg Thomas ([id number]) Allergies Family History No family history of Cancer, Diabetes, Heart Disease, Hereditary Spherocytosis, Hypertension, Kidney Disease, Lung Disease, Seizures, Stroke, Thyroid Problems, Tuberculosis. Social History Never smoker, Marital Status - Married, Alcohol Use - Never, Drug Use - No History, Caffeine Use - Daily. Medical History Musculoskeletal Patient has history of Osteoarthritis Hospitalization/Surgery History - 05/02/2014, [facility], Surgical debridement of wound. Review of Systems (ROS) Eyes The patient has no complaints or symptoms. Ear/Nose/Mouth/Throat The patient has no complaints or symptoms. Hematologic/Lymphatic The patient has no complaints or symptoms. Respiratory The patient has no complaints or symptoms. Cardiovascular Complains or has symptoms of LE edema. Gastrointestinal The patient has no complaints or symptoms. Endocrine The patient has no complaints or symptoms. Genitourinary The patient has no complaints or symptoms. Immunological The patient has no complaints or symptoms. Integumentary (Skin) Complains or has symptoms of Wounds, Breakdown, Swelling. Musculoskeletal The patient has no complaints or symptoms. Neurologic The patient has no complaints or symptoms. Oncologic The patient has no complaints or symptoms. Psychiatric The patient has no complaints or symptoms. Gregg Thomas, Gregg Thomas ([id number]) Objective Constitutional sitting or standing blood pressure is within target range for patient.. pulse regular and within target range for patient.Marland Kitchen respirations regular, non-labored and within target range for patient.Marland Kitchen temperature within target range for patient.. Well-nourished and well-hydrated in no acute distress. Vitals Time Taken: 12:51 PM, Source: Stated,  Weight: 177 lbs, Source: Measured, Temperature: 97.7 F, Pulse: 63 bpm, Respiratory Rate: 17 breaths/min, Blood Pressure: 119/64 mmHg. Ears, Nose, Mouth, and Throat no gross abnormality of ear auricles or external auditory canals. normal hearing noted during conversation. mucus membranes moist. Respiratory normal breathing without difficulty. clear to auscultation bilaterally. Cardiovascular regular rate and rhythm with normal S1, S2. 1+ dorsalis pedis/posterior tibialis pulses. Bilateral lower extremity edema noted. Gastrointestinal (GI) soft, non-tender, non-distended, +BS. Musculoskeletal normal gait and posture. Psychiatric this patient is able to make decisions and demonstrates good insight into disease process. Alert and Oriented x 3. pleasant and cooperative. Integumentary (Hair, Skin) Wound #2  status is Healed - Epithelialized. Original cause of wound was Gradually Appeared. The wound is located on the Left,Lateral,Posterior Lower Leg. The wound measures 0cm length x 0cm width x 0cm depth; 0cm^2 area and 0cm^3 volume. Wound #3 status is Healed - Epithelialized. Original cause of wound was Shear/Friction. The wound is located on the Left,Posterior Lower Leg. The wound measures 0cm length x 0cm width x 0cm depth; 0cm^2 area and 0cm^3 volume. Gregg Thomas, Gregg Thomas ([id number]) Assessment Active Problems ICD-10 I87.2 - Venous insufficiency (chronic) (peripheral) L97.821 - Non-pressure chronic ulcer of other part of left lower leg limited to breakdown of skin Plan [facility]charge From [facility] Services: Discharge from [place] Completed. Consult only As patient has healed on evaluation today my main suggestion was going forward to have him cover the healed area with a bandage prior to putting on his compression wrapping. I just do not want there to be any skin breakdown secondary to pulling the stockings over the newly healed region. Patient and his wife  voiced understanding. The good news is there is no evidence of infection and overall I feel like this is going to do very well. If patient or his wife have any concerns going forward they will contact the office for additional recommendations. Otherwise we will discontinue wound care services at this time as I feel it is not needed for him to come back regarding this one in particular. However if that changes or anything worsens they will let us know. Electronic Signature(s) Signed: 04/26/2017 5:05:38 PM By: Worthy Keeler PA-C Entered By: Worthy Keeler on 04/26/2017 13:46:24 Gregg Thomas ([id number]) -------------------------------------------------------------------------------- ROS/PFSH Details Patient Name: Gregg Thomas Date of Service: 04/26/2017 12:45 PM Medical Record Number: [id number] Patient Account Number: [id number] Date of Birth/Sex: April 29, 1931 (83 y.o. Male) Treating RN: Baruch Gouty, RN, BSN, [place] Primary Care Provider: Golden Pop Other Clinician: Referring Provider: Golden Pop Treating Provider/Extender: Melburn Hake, Aryiah Monterosso Weeks in Treatment: 0 Information Obtained From Patient Wound History Do you currently have one or more open woundso Yes How many open wounds do you currently haveo 2 Approximately how long have you had your woundso 6 weeks How have you been treating your wound(s) until nowo bandaid Has your wound(s) ever healed and then re-openedo No Have you had any lab work done in the past montho No Have you tested positive for an antibiotic resistant organism (MRSA, VRE)o No Have you tested positive for osteomyelitis (bone infection)o No Have you had any tests for circulation on your legso No Have you had other problems associated with your woundso Infection, Swelling Cardiovascular Complaints and Symptoms: Positive for: LE edema Medical History: Positive for: Deep Vein Thrombosis; Hypertension Negative for: Angina; Arrhythmia; Congestive  Heart Failure; Coronary Artery Disease; Hypotension; Myocardial Infarction; Peripheral Arterial Disease; Peripheral Venous Disease; Phlebitis; Vasculitis Integumentary (Skin) Complaints and Symptoms: Positive for: Wounds; Breakdown; Swelling Medical History: Negative for: History of Burn; History of pressure wounds Eyes Complaints and Symptoms: No Complaints or Symptoms Medical History: Negative for: Cataracts; Glaucoma; Optic Neuritis Ear/Nose/Mouth/Throat Gregg Thomas, Gregg Thomas ([id number]) Complaints and Symptoms: No Complaints or Symptoms Medical History: Negative for: Chronic sinus problems/congestion; Middle ear problems Hematologic/Lymphatic Complaints and Symptoms: No Complaints or Symptoms Medical History: Negative for: Anemia; Hemophilia; Human Immunodeficiency Virus; Lymphedema; Sickle Cell Disease Respiratory Complaints and Symptoms: No Complaints or Symptoms Medical History: Negative for: Aspiration; Asthma; Chronic Obstructive Pulmonary Disease (COPD); Pneumothorax; Sleep Apnea; Tuberculosis Gastrointestinal Complaints and Symptoms: No Complaints or Symptoms Medical History: Negative for: Cirrhosis ;  Colitis; Crohnos; Hepatitis A; Hepatitis B; Hepatitis C Endocrine Complaints and Symptoms: No Complaints or Symptoms Medical History: Negative for: Type I Diabetes; Type II Diabetes Genitourinary Complaints and Symptoms: No Complaints or Symptoms Medical History: Negative for: End Stage Renal Disease Immunological Complaints and Symptoms: No Complaints or Symptoms Gregg Thomas, [place]. ([id number]) Medical History: Negative for: Lupus Erythematosus; Raynaudos; Scleroderma Musculoskeletal Complaints and Symptoms: No Complaints or Symptoms Medical History: Positive for: Osteoarthritis Negative for: Gout; Rheumatoid Arthritis; Osteomyelitis Neurologic Complaints and Symptoms: No Complaints or Symptoms Medical History: Negative for: Dementia; Neuropathy;  Quadriplegia; Paraplegia; Seizure Disorder Oncologic Complaints and Symptoms: No Complaints or Symptoms Medical History: Positive for: Received Radiation - L ear Negative for: Received Chemotherapy Psychiatric Complaints and Symptoms: No Complaints or Symptoms Medical History: Negative for: Anorexia/bulimia; Confinement Anxiety Immunizations Pneumococcal Vaccine: Received Pneumococcal Vaccination: No Tetanus Vaccine: Last tetanus shot: 04/30/2012 Hospitalization / Surgery History Name of Hospital Purpose of Hospitalization/Surgery Date [facility] Surgical debridement of wound 05/02/2014 Family and Social History Cancer: No; Diabetes: No; Heart Disease: No; Hereditary Spherocytosis: No; Hypertension: No; Kidney Disease: No; Lung Disease: No; Seizures: No; Stroke: No; Thyroid Problems: No; Tuberculosis: No; Never smoker; Marital Status - Married; Alcohol Use: Never; Drug Use: No History; Caffeine Use: Daily; Financial Gregg Thomas, Gregg Thomas ([id number]) Concerns: No; Food, Clothing or Shelter Needs: No; Support System Lacking: No; Transportation Concerns: No; Advanced Directives: Yes (Not Provided); Patient does not want information on Advanced Directives; Do not resuscitate: Yes (Not Provided); Living Will: Yes (Not Provided); Medical Power of Attorney: Yes (Not Provided) Electronic Signature(s) Signed: 04/26/2017 4:36:00 PM By: Regan Lemming BSN, RN Signed: 04/26/2017 5:05:38 PM By: Worthy Keeler PA-C Entered By: Regan Lemming on 04/26/2017 12:53:07 Gregg Thomas ([id number]) -------------------------------------------------------------------------------- SuperBill Details Patient Name: Gregg Thomas Date of Service: 04/26/2017 Medical Record Number: [id number] Patient Account Number: [id number] Date of Birth/Sex: February 24, 1931 (83 y.o. 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

## 2017-05-17 ENCOUNTER — Ambulatory Visit: Payer: Medicare Other | Admitting: Hematology and Oncology

## 2017-05-17 ENCOUNTER — Other Ambulatory Visit: Payer: Medicare Other

## 2017-05-28 ENCOUNTER — Telehealth: Payer: Self-pay | Admitting: *Deleted

## 2017-05-28 ENCOUNTER — Inpatient Hospital Stay: Payer: Medicare Other | Attending: Hematology and Oncology | Admitting: Hematology and Oncology

## 2017-05-28 ENCOUNTER — Inpatient Hospital Stay: Payer: Medicare Other

## 2017-05-28 ENCOUNTER — Other Ambulatory Visit: Payer: Self-pay | Admitting: *Deleted

## 2017-05-28 VITALS — BP 136/72 | HR 67 | Temp 96.6°F | Resp 18 | Wt 178.3 lb

## 2017-05-28 DIAGNOSIS — Z85828 Personal history of other malignant neoplasm of skin: Secondary | ICD-10-CM | POA: Insufficient documentation

## 2017-05-28 DIAGNOSIS — Z7901 Long term (current) use of anticoagulants: Secondary | ICD-10-CM | POA: Diagnosis not present

## 2017-05-28 DIAGNOSIS — I2782 Chronic pulmonary embolism: Secondary | ICD-10-CM

## 2017-05-28 DIAGNOSIS — I82501 Chronic embolism and thrombosis of unspecified deep veins of right lower extremity: Secondary | ICD-10-CM

## 2017-05-28 DIAGNOSIS — Z86718 Personal history of other venous thrombosis and embolism: Secondary | ICD-10-CM | POA: Insufficient documentation

## 2017-05-28 DIAGNOSIS — Z86711 Personal history of pulmonary embolism: Secondary | ICD-10-CM | POA: Insufficient documentation

## 2017-05-28 DIAGNOSIS — Z79899 Other long term (current) drug therapy: Secondary | ICD-10-CM | POA: Diagnosis not present

## 2017-05-28 DIAGNOSIS — R918 Other nonspecific abnormal finding of lung field: Secondary | ICD-10-CM | POA: Insufficient documentation

## 2017-05-28 LAB — PROTIME-INR
INR: 2.09
Prothrombin Time: 23.8 seconds — ABNORMAL HIGH (ref 11.4–15.2)

## 2017-05-28 NOTE — Progress Notes (Signed)
[name] day:  05/28/17  Chief Complaint: Gregg Thomas is a 83 y.o. male with a history of left lower extremity deep venous thrombosis (DVT) and pulmonary embolism who is seen for 6 month assessment.  HPI: The patient was last seen in the medical oncology clinic on 11/12/2016.  At that time,  He was doing well.  He denied any bruising or bleeding.  INR was 1.98.  He remains on Coumadin 5 mg daily except for Fridays (7.5 mg).  He has had monthly INR checks.  INR was 2.00 on 04/16/2017.  During the interim, he has felt "good".  He denies any bruising or bleeding.   Past Medical History:  Diagnosis Date  . Heart murmur   . Skin cancer     No past surgical history on file.  Family History  Problem Relation Age of Onset  . Stroke Mother     Social History:  reports that he has never smoked. He has never used smokeless tobacco. He reports that he does not drink alcohol or use drugs.  He lives in [place].   Allergies: No Known Allergies  Current Medications: Current Outpatient Prescriptions  Medication Sig Dispense Refill  . amLODipine (NORVASC) 10 MG tablet Take by mouth.    . Cyanocobalamin (RA VITAMIN B-12 TR) 1000 MCG TBCR Take by mouth.    . enalapril (VASOTEC) 20 MG tablet Take by mouth.    . Grape Seed Extract 50 MG CAPS Take by mouth.    . warfarin (COUMADIN) 5 MG tablet TAKE AS DIRECTED. 30 tablet 0  . warfarin (COUMADIN) 2.5 MG tablet TAKE AS DIRECTED. 30 tablet 0   No current facility-administered medications for this visit.     Review of Systems:  GENERAL:  Feels "good". No fevers or sweats.  Weight stable. PERFORMANCE STATUS (ECOG):  0 HEENT:  No visual changes, sore throat, mouth sores or tenderness. Lungs: No shortness of breath or cough.  No hemoptysis. Cardiac:  No chest pain, palpitations, orthopnea, or PND. GI:  No nausea, vomiting, diarrhea, constipation, melena or hematochezia. GU:  No urgency,  frequency, dysuria, or hematuria. Musculoskeletal:  No back pain.  No joint pain.  No muscle tenderness. Extremities:  No pain or swelling. Skin:  No rashes or skin changes. Neuro:  No headache, numbness or weakness, balance or coordination issues. Endocrine:  No diabetes, thyroid issues, hot flashes or night sweats. Psych:  No mood changes, depression or anxiety. Pain:  No focal pain. Review of systems:  All other systems reviewed and found to be negative.  Physical Exam: Blood pressure 136/72, pulse 67, temperature (!) 96.6 F (35.9 C), temperature source Tympanic, resp. rate 18, weight 178 lb 5 oz (80.9 kg). GENERAL:  Well developed, well nourished, gentleman sitting comfortably in the exam room in no acute distress. MENTAL STATUS:  Alert and oriented to person, place and time. HEAD:  Pearline Cables hair.  Normocephalic, atraumatic, face symmetric, no Cushingoid features. EYES:  Glasses.  Hazel eyes.  Right ectropion.  Pupils equal round and reactive to light and accomodation.  No conjunctivitis or scleral icterus. ENT:  Oropharynx clear without lesion.  Dentures.  Tongue normal. Mucous membranes moist.  RESPIRATORY:  Clear to auscultation without rales, wheezes or rhonchi. CARDIOVASCULAR:  Regular rate and rhythm without murmur, rub or gallop. ABDOMEN:  Soft, non-tender, with active bowel sounds, and no hepatosplenomegaly.  No masses. SKIN:  s/p Mohs surgery right cheek, healing slowly.  Right ectropion s/p  repair.  Multiple scalp lesions..No rashes, ulcers or lesions. EXTREMITIES: No edema, no skin discoloration or tenderness.  No palpable cords. LYMPH NODES: No palpable cervical, supraclavicular, axillary or inguinal adenopathy  NEUROLOGICAL: Unremarkable. PSYCH:  Appropriate.   No visits with results within 3 Day(s) from this visit.  Latest known visit with results is:  Appointment on 04/16/2017  Component Date Value Ref Range Status  . Prothrombin Time 04/16/2017 23.0* 11.4 - 15.2  seconds Final  . INR 04/16/2017 2.00   Final    Assessment:  Gregg Thomas is a 83 y.o. male with a DVT and pulmonary embolism on 06/22/2010 following a long car ride. His lifetime risk of recurrent clot is felt to be 30%. Decision was made to hold Coumadin for procedures then restart Coumadin with a Lovenox bridge.  Hypercoagulable workup revealed the following negative studies: Factor V Leiden, prothrombin gene mutation, beta 2 glycoprotein, protein S free and and total, lupus anticoagulant panel, anti-cardiolipin antibodies. Protein C antigen was slightly low at 58%. Anti-ithrombin III was lower limits of normal at 73%. Additional negative studies included a CEA, PSA, SPEP and ANA.  He was hospitalized on 05/21/2014 with a thigh hematoma. His anticoagulation was reversed with FFP. He had surgery. Hemoglobin dropped to 10.8.  He is on Coumadin 5 mg daily except for Fridays (7.5 mg). Total weekly Coumadin dose is 37.5 mg.   He has a history of small pulmonary nodules on chest CT. Nodules have been stable per radiology (2011 to 2013). He has never smoked,  He had Mohs surgery of the right cheek for a basal cell carcinoma.  He underwent surgery for right ectropion.  Symptomatically, he denies any bruising or bleeding. Exam is unremarkable.  INR is 2.09.  Plan: 1.  Labs today:  PT/INR. 2.  Continue current dose of Coumadin. 3.  Call patient with INR. 4.  RTC monthly for PT/INR. 5.  RTC in 6 months for MD assessment and labs (CBC with diff, CMP, PT/INR).    Lequita Asal, MD  05/28/2017, 3:28 PM

## 2017-05-28 NOTE — Telephone Encounter (Signed)
Called patient to inform him that his INR is good.  He is to continue current dose of Coumadin.  Will check INR monthly.

## 2017-05-28 NOTE — Progress Notes (Signed)
Patient offers no complaints. 

## 2017-05-28 NOTE — Telephone Encounter (Signed)
-----   Message from Lequita Asal, MD sent at 05/28/2017  5:17 PM EDT ----- Regarding: Please call patient  INR good at 2.09.  Continue current dose of Coumadin.  Check INR monthly.  M  ----- Message ----- From: Interface, Lab In [place] Sent: 05/28/2017   3:32 PM To: Lequita Asal, MD

## 2017-06-17 ENCOUNTER — Other Ambulatory Visit: Payer: Self-pay | Admitting: Hematology and Oncology

## 2017-06-25 ENCOUNTER — Inpatient Hospital Stay: Payer: Medicare Other | Attending: Hematology and Oncology

## 2017-06-25 DIAGNOSIS — I2782 Chronic pulmonary embolism: Secondary | ICD-10-CM

## 2017-06-25 DIAGNOSIS — Z86718 Personal history of other venous thrombosis and embolism: Secondary | ICD-10-CM | POA: Diagnosis not present

## 2017-06-25 DIAGNOSIS — I82501 Chronic embolism and thrombosis of unspecified deep veins of right lower extremity: Secondary | ICD-10-CM

## 2017-06-25 LAB — PROTIME-INR
INR: 2.1
Prothrombin Time: 23.9 seconds — ABNORMAL HIGH (ref 11.4–15.2)

## 2017-07-15 ENCOUNTER — Other Ambulatory Visit: Payer: Self-pay | Admitting: Hematology and Oncology

## 2017-07-23 ENCOUNTER — Inpatient Hospital Stay: Payer: Medicare Other

## 2017-07-25 ENCOUNTER — Inpatient Hospital Stay: Payer: Medicare Other | Attending: Hematology and Oncology

## 2017-07-25 DIAGNOSIS — Z86718 Personal history of other venous thrombosis and embolism: Secondary | ICD-10-CM | POA: Diagnosis not present

## 2017-07-25 LAB — PROTIME-INR
INR: 2.23
Prothrombin Time: 25.1 seconds — ABNORMAL HIGH (ref 11.4–15.2)

## 2017-07-26 ENCOUNTER — Telehealth: Payer: Self-pay | Admitting: *Deleted

## 2017-07-26 NOTE — Telephone Encounter (Signed)
-----   Message from Lequita Asal, MD sent at 07/25/2017  4:41 PM EDT ----- Regarding: Please call patient  INR is good.  2.23  M  ----- Message ----- From: Interface, Lab In [place] Sent: 07/25/2017   2:23 PM To: Lequita Asal, MD

## 2017-07-26 NOTE — Telephone Encounter (Signed)
Called patient to inform him that his INR is good.

## 2017-08-12 ENCOUNTER — Other Ambulatory Visit: Payer: Self-pay | Admitting: Hematology and Oncology

## 2017-08-20 ENCOUNTER — Inpatient Hospital Stay: Payer: Medicare Other | Attending: Hematology and Oncology

## 2017-08-20 DIAGNOSIS — Z86718 Personal history of other venous thrombosis and embolism: Secondary | ICD-10-CM | POA: Diagnosis not present

## 2017-08-20 LAB — PROTIME-INR
INR: 2.27
Prothrombin Time: 25.4 seconds — ABNORMAL HIGH (ref 11.4–15.2)

## 2017-08-21 ENCOUNTER — Telehealth: Payer: Self-pay | Admitting: *Deleted

## 2017-08-21 NOTE — Telephone Encounter (Signed)
Called patient and LVM that his INR is good.

## 2017-08-21 NOTE — Telephone Encounter (Signed)
-----   Message from Lequita Asal, MD sent at 08/20/2017  6:07 PM EDT ----- Regarding: Please call patient  INR good.  M  ----- Message ----- From: Interface, Lab In [place] Sent: 08/20/2017   4:44 PM To: Lequita Asal, MD

## 2017-08-22 ENCOUNTER — Encounter: Payer: Self-pay | Admitting: Hematology and Oncology

## 2017-08-22 DIAGNOSIS — Z7901 Long term (current) use of anticoagulants: Secondary | ICD-10-CM | POA: Insufficient documentation

## 2017-08-27 ENCOUNTER — Telehealth: Payer: Self-pay | Admitting: Family Medicine

## 2017-08-27 NOTE — Telephone Encounter (Signed)
Called pt to schedule for Annual Wellness Visit with Nurse Health Advisor, Tiffany Hill, my c/b # is [phone]  Kathryn Brown ° °

## 2017-08-29 ENCOUNTER — Ambulatory Visit (INDEPENDENT_AMBULATORY_CARE_PROVIDER_SITE_OTHER): Payer: Medicare Other

## 2017-08-29 VITALS — BP 139/78 | HR 80 | Temp 98.2°F | Resp 16 | Ht 67.0 in | Wt 179.0 lb

## 2017-08-29 DIAGNOSIS — Z0000 Encounter for general adult medical examination without abnormal findings: Secondary | ICD-10-CM | POA: Diagnosis not present

## 2017-08-29 DIAGNOSIS — Z23 Encounter for immunization: Secondary | ICD-10-CM | POA: Diagnosis not present

## 2017-08-29 NOTE — Progress Notes (Signed)
Subjective:   Gregg Thomas is a 83 y.o. male who presents for Medicare Annual/Subsequent preventive examination.  Review of Systems:  Cardiac Risk Factors include: advanced age (>24men, >41 women);hypertension     Objective:    Vitals: BP 139/78 (BP Location: Left Arm, Patient Position: Sitting)   Pulse 80   Temp 98.2 F (36.8 C)   Resp 16   Ht 5\' 7"  (1.702 m)   Wt 179 lb (81.2 kg)   BMI 28.04 kg/m   Body mass index is 28.04 kg/m.  Tobacco History  Smoking Status  . Never Smoker  Smokeless Tobacco  . Never Used     Counseling given: Not Answered   Past Medical History:  Diagnosis Date  . Chronic kidney disease   . Clotting disorder ([place])   . Heart murmur   . Skin cancer    Past Surgical History:  Procedure Laterality Date  . EYE SURGERY Right    benign growth on right eye   . KNEE SURGERY Right   . MELANOMA EXCISION Bilateral    lower back    Family History  Problem Relation Age of Onset  . Stroke Mother   . Cerebral aneurysm Father    History  Sexual Activity  . Sexual activity: Not on file    Outpatient Encounter Prescriptions as of 08/29/2017  Medication Sig  . amLODipine (NORVASC) 10 MG tablet Take by mouth.  . Cyanocobalamin (RA VITAMIN B-12 TR) 1000 MCG TBCR Take by mouth.  . enalapril (VASOTEC) 20 MG tablet Take by mouth.  . Grape Seed Extract 50 MG CAPS Take 1,300 mg by mouth.   . warfarin (COUMADIN) 2.5 MG tablet TAKE AS DIRECTED. (Patient taking differently: Take 2.5 mg on Fridays with a 5 mg tablet for total dose of 7.5 mg every Friday)  . warfarin (COUMADIN) 5 MG tablet Take 5 mg daily except on Friday, take 7.5 mg   No facility-administered encounter medications on file as of 08/29/2017.     Activities of Daily Living In your present state of health, do you have any difficulty performing the following activities: 08/29/2017  Hearing? N  Vision? N  Difficulty concentrating or making decisions? N  Walking or climbing  stairs? N  Dressing or bathing? N  Doing errands, shopping? N  Preparing Food and eating ? N  Using the Toilet? N  In the past six months, have you accidently leaked urine? N  Do you have problems with loss of bowel control? N  Managing your Medications? N  Managing your Finances? N  Housekeeping or managing your Housekeeping? N  Some recent data might be hidden    Patient Care Team: Guadalupe Maple, MD as PCP - General (Family Medicine) Lequita Asal, MD as Referring Physician (Hematology and Oncology) Dagoberto Ligas, MD as Referring Physician (Internal Medicine) Merril Abbe, MD as Referring Physician (Dermatology)   Assessment:     Exercise Activities and Dietary recommendations Current Exercise Habits: Home exercise routine, Time (Minutes): 20, Frequency (Times/Week): 3, Weekly Exercise (Minutes/Week): 60, Intensity: Mild, Exercise limited by: None identified  Goals    . Increase water intake          Recommend drinking at least 4-5 glasses of water a day       Fall Risk Fall Risk  08/29/2017  Falls in the past year? No   Depression Screen PHQ 2/9 Scores 08/29/2017  PHQ - 2 Score 0    Cognitive Function  6CIT Screen 08/29/2017  What Year? 0 points  What month? 0 points  What time? 0 points  Count back from 20 0 points  Months in reverse 0 points  Repeat phrase 0 points  Total Score 0    Immunization History  Administered Date(s) Administered  . Influenza, High Dose Seasonal PF 10/09/2016  . Pneumococcal Polysaccharide-23 08/29/2017   Screening Tests Health Maintenance  Topic Date Due  . INFLUENZA VACCINE  07/31/2017  . TETANUS/TDAP  08/31/2018 (Originally 09/13/1950)  . PNA vac Low Risk Adult  Completed      Plan:     I have personally reviewed and addressed the Medicare Annual Wellness questionnaire and have noted the following in the patient's chart:  A. Medical and social history B. Use of alcohol, tobacco or illicit drugs   C. Current medications and supplements D. Functional ability and status E.  Nutritional status F.  Physical activity G. Advance directives H. List of other physicians I.  Hospitalizations, surgeries, and ER visits in previous 12 months J.  [place] such as hearing and vision if needed, cognitive and depression L. Referrals and appointments  In addition, I have reviewed and discussed with patient certain preventive protocols, quality metrics, and best practice recommendations. A written personalized care plan for preventive services as well as general preventive health recommendations were provided to patient.   Signed,  Tyler Aas, LPN Nurse Health Advisor   MD Recommendations: none

## 2017-08-29 NOTE — Patient Instructions (Addendum)
Mr. Gregg Thomas , Thank you for taking time to come for your Medicare Wellness Visit. I appreciate your ongoing commitment to your health goals. Please review the following plan we discussed and let me know if I can assist you in the future.   Screening recommendations/referrals: Colonoscopy: completed 01/04/2011, no longer required Recommended yearly ophthalmology/optometry visit for glaucoma screening and checkup Recommended yearly dental visit for hygiene and checkup  Vaccinations: Influenza vaccine: up to date, due 08/2017 Pneumococcal vaccine: pneumovax 23 done today  Tdap vaccine: due, check with your insurance company for coverage Shingles vaccine:  due, check with your insurance company for coverage  Advanced directives: Please bring a copy of your health care power of attorney and living will to the office at your convenience.  Conditions/risks identified: Recommend drinking at least 4-5 glasses of water a day   Next appointment: Follow up in one year for your annual wellness exam.   Preventive Care 65 Years and Older, Male Preventive care refers to lifestyle choices and visits with your health care provider that can promote health and wellness. What does preventive care include?  A yearly physical exam. This is also called an annual well check.  Dental exams once or twice a year.  Routine eye exams. Ask your health care provider how often you should have your eyes checked.  Personal lifestyle choices, including:  Daily care of your teeth and gums.  Regular physical activity.  Eating a healthy diet.  Avoiding tobacco and drug use.  Limiting alcohol use.  Practicing safe sex.  Taking low doses of aspirin every day.  Taking vitamin and mineral supplements as recommended by your health care provider. What happens during an annual well check? The services and screenings done by your health care provider during your annual well check will depend on your age, overall  health, lifestyle risk factors, and family history of disease. Counseling  Your health care provider may ask you questions about your:  Alcohol use.  Tobacco use.  Drug use.  Emotional well-being.  Home and relationship well-being.  Sexual activity.  Eating habits.  History of falls.  Memory and ability to understand (cognition).  Work and work Statistician. Screening  You may have the following tests or measurements:  Height, weight, and BMI.  Blood pressure.  Lipid and cholesterol levels. These may be checked every 5 years, or more frequently if you are over 26 years old.  Skin check.  Lung cancer screening. You may have this screening every year starting at age 48 if you have a 30-pack-year history of smoking and currently smoke or have quit within the past 15 years.  Fecal occult blood test (FOBT) of the stool. You may have this test every year starting at age 15.  Flexible sigmoidoscopy or colonoscopy. You may have a sigmoidoscopy every 5 years or a colonoscopy every 10 years starting at age 78.  Prostate cancer screening. Recommendations will vary depending on your family history and other risks.  Hepatitis C blood test.  Hepatitis B blood test.  Sexually transmitted disease (STD) testing.  Diabetes screening. This is done by checking your blood sugar (glucose) after you have not eaten for a while (fasting). You may have this done every 1-3 years.  Abdominal aortic aneurysm (AAA) screening. You may need this if you are a current or former smoker.  Osteoporosis. You may be screened starting at age 69 if you are at high risk. Talk with your health care provider about your test results, treatment options,  and if necessary, the need for more tests. Vaccines  Your health care provider may recommend certain vaccines, such as:  Influenza vaccine. This is recommended every year.  Tetanus, diphtheria, and acellular pertussis (Tdap, Td) vaccine. You may need a Td  booster every 10 years.  Zoster vaccine. You may need this after age 66.  Pneumococcal 13-valent conjugate (PCV13) vaccine. One dose is recommended after age 55.  Pneumococcal polysaccharide (PPSV23) vaccine. One dose is recommended after age 13. Talk to your health care provider about which screenings and vaccines you need and how often you need them. This information is not intended to replace advice given to you by your health care provider. Make sure you discuss any questions you have with your health care provider. Document Released: 01/13/2016 Document Revised: 09/05/2016 Document Reviewed: 10/18/2015 Elsevier Interactive Patient Education  2017 [place] Prevention in the Home Falls can cause injuries. They can happen to people of all ages. There are many things you can do to make your home safe and to help prevent falls. What can I do on the outside of my home?  Regularly fix the edges of walkways and driveways and fix any cracks.  Remove anything that might make you trip as you walk through a door, such as a raised step or threshold.  Trim any bushes or trees on the path to your home.  Use bright outdoor lighting.  Clear any walking paths of anything that might make someone trip, such as rocks or tools.  Regularly check to see if handrails are loose or broken. Make sure that both sides of any steps have handrails.  Any raised decks and porches should have guardrails on the edges.  Have any leaves, snow, or ice cleared regularly.  Use sand or salt on walking paths during winter.  Clean up any spills in your garage right away. This includes oil or grease spills. What can I do in the bathroom?  Use night lights.  Install grab bars by the toilet and in the tub and shower. Do not use towel bars as grab bars.  Use non-skid mats or decals in the tub or shower.  If you need to sit down in the shower, use a plastic, non-slip stool.  Keep the floor dry. Clean up  any water that spills on the floor as soon as it happens.  Remove soap buildup in the tub or shower regularly.  Attach bath mats securely with double-sided non-slip rug tape.  Do not have throw rugs and other things on the floor that can make you trip. What can I do in the bedroom?  Use night lights.  Make sure that you have a light by your bed that is easy to reach.  Do not use any sheets or blankets that are too big for your bed. They should not hang down onto the floor.  Have a firm chair that has side arms. You can use this for support while you get dressed.  Do not have throw rugs and other things on the floor that can make you trip. What can I do in the kitchen?  Clean up any spills right away.  Avoid walking on wet floors.  Keep items that you use a lot in easy-to-reach places.  If you need to reach something above you, use a strong step stool that has a grab bar.  Keep electrical cords out of the way.  Do not use floor polish or wax that makes floors slippery. If  you must use wax, use non-skid floor wax.  Do not have throw rugs and other things on the floor that can make you trip. What can I do with my stairs?  Do not leave any items on the stairs.  Make sure that there are handrails on both sides of the stairs and use them. Fix handrails that are broken or loose. Make sure that handrails are as long as the stairways.  Check any carpeting to make sure that it is firmly attached to the stairs. Fix any carpet that is loose or worn.  Avoid having throw rugs at the top or bottom of the stairs. If you do have throw rugs, attach them to the floor with carpet tape.  Make sure that you have a light switch at the top of the stairs and the bottom of the stairs. If you do not have them, ask someone to add them for you. What else can I do to help prevent falls?  Wear shoes that:  Do not have high heels.  Have rubber bottoms.  Are comfortable and fit you well.  Are  closed at the toe. Do not wear sandals.  If you use a stepladder:  Make sure that it is fully opened. Do not climb a closed stepladder.  Make sure that both sides of the stepladder are locked into place.  Ask someone to hold it for you, if possible.  Clearly mark and make sure that you can see:  Any grab bars or handrails.  First and last steps.  Where the edge of each step is.  Use tools that help you move around (mobility aids) if they are needed. These include:  Canes.  Walkers.  Scooters.  Crutches.  Turn on the lights when you go into a dark area. Replace any light bulbs as soon as they burn out.  Set up your furniture so you have a clear path. Avoid moving your furniture around.  If any of your floors are uneven, fix them.  If there are any pets around you, be aware of where they are.  Review your medicines with your doctor. Some medicines can make you feel dizzy. This can increase your chance of falling. Ask your doctor what other things that you can do to help prevent falls. This information is not intended to replace advice given to you by your health care provider. Make sure you discuss any questions you have with your health care provider. Document Released: 10/13/2009 Document Revised: 05/24/2016 Document Reviewed: 01/21/2015 Elsevier Interactive Patient Education  2017 [place].   Pneumococcal Polysaccharide Vaccine: What You Need to Know 1. Why get vaccinated? Vaccination can protect older adults (and some children and younger adults) from pneumococcal disease. Pneumococcal disease is caused by bacteria that can spread from person to person through close contact. It can cause ear infections, and it can also lead to more serious infections of the:  Lungs (pneumonia),  Blood (bacteremia), and  Covering of the brain and spinal cord (meningitis). Meningitis can cause deafness and brain damage, and it can be fatal.  Anyone can get pneumococcal  disease, but children under 28 years of age, people with certain medical conditions, adults over 64 years of age, and cigarette smokers are at the highest risk. About 18,000 older adults die each year from pneumococcal disease in the [place]. Treatment of pneumococcal infections with penicillin and other drugs used to be more effective. But some strains of the disease have become resistant to these drugs. This  makes prevention of the disease, through vaccination, even more important. 2. Pneumococcal polysaccharide vaccine (PPSV23) Pneumococcal polysaccharide vaccine (PPSV23) protects against 23 types of pneumococcal bacteria. It will not prevent all pneumococcal disease. PPSV23 is recommended for:  All adults 101 years of age and older,  Anyone 2 through 81 years of age with certain long-term health problems,  Anyone 2 through 81 years of age with a weakened immune system,  Adults 2 through 81 years of age who smoke cigarettes or have asthma.  Most people need only one dose of PPSV. A second dose is recommended for certain high-risk groups. People 91 and older should get a dose even if they have gotten one or more doses of the vaccine before they turned 65. Your healthcare provider can give you more information about these recommendations. Most healthy adults develop protection within 2 to 3 weeks of getting the shot. 3. Some people should not get this vaccine  Anyone who has had a life-threatening allergic reaction to PPSV should not get another dose.  Anyone who has a severe allergy to any component of PPSV should not receive it. Tell your provider if you have any severe allergies.  Anyone who is moderately or severely ill when the shot is scheduled may be asked to wait until they recover before getting the vaccine. Someone with a mild illness can usually be vaccinated.  Children less than 34 years of age should not receive this vaccine.  There is no evidence that PPSV is harmful to  either a pregnant woman or to her fetus. However, as a precaution, women who need the vaccine should be vaccinated before becoming pregnant, if possible. 4. Risks of a vaccine reaction With any medicine, including vaccines, there is a chance of side effects. These are usually mild and go away on their own, but serious reactions are also possible. About half of people who get PPSV have mild side effects, such as redness or pain where the shot is given, which go away within about two days. Less than 1 out of 100 people develop a fever, muscle aches, or more severe local reactions. Problems that could happen after any vaccine:  People sometimes faint after a medical procedure, including vaccination. Sitting or lying down for about 15 minutes can help prevent fainting, and injuries caused by a fall. Tell your doctor if you feel dizzy, or have vision changes or ringing in the ears.  Some people get severe pain in the shoulder and have difficulty moving the arm where a shot was given. This happens very rarely.  Any medication can cause a severe allergic reaction. Such reactions from a vaccine are very rare, estimated at about 1 in a million doses, and would happen within a few minutes to a few hours after the vaccination. As with any medicine, there is a very remote chance of a vaccine causing a serious injury or death. The safety of vaccines is always being monitored. For more information, visit: http://www.aguilar.org/ 5. What if there is a serious reaction? What should I look for? Look for anything that concerns you, such as signs of a severe allergic reaction, very high fever, or unusual behavior. Signs of a severe allergic reaction can include hives, swelling of the face and throat, difficulty breathing, a fast heartbeat, dizziness, and weakness. These would usually start a few minutes to a few hours after the vaccination. What should I do? If you think it is a severe allergic reaction or other  emergency that can't wait,  call 9-1-1 or get to the nearest hospital. Otherwise, call your doctor. Afterward, the reaction should be reported to the Vaccine Adverse Event Reporting System (VAERS). Your doctor might file this report, or you can do it yourself through the VAERS web site at www.vaers.SamedayNews.es, or by calling 201-419-9236. VAERS does not give medical advice. 6. How can I learn more?  Ask your doctor. He or she can give you the vaccine package insert or suggest other sources of information.  Call your local or state health department.  Contact the Centers for Disease Control and Prevention (CDC): ? Call 463 624 3115 (1-800-CDC-INFO) or ? Visit CDC's website at http://hunter.com/ CDC Pneumococcal Polysaccharide Vaccine VIS (04/23/14) This information is not intended to replace advice given to you by your health care provider. Make sure you discuss any questions you have with your health care provider. Document Released: 10/14/2006 Document Revised: 09/06/2016 Document Reviewed: 09/06/2016 Elsevier Interactive Patient Education  2017 [employer].

## 2017-09-17 ENCOUNTER — Inpatient Hospital Stay: Payer: Medicare Other | Attending: Hematology and Oncology

## 2017-09-17 DIAGNOSIS — Z86718 Personal history of other venous thrombosis and embolism: Secondary | ICD-10-CM

## 2017-09-17 LAB — PROTIME-INR
INR: 2.61
Prothrombin Time: 27.7 seconds — ABNORMAL HIGH (ref 11.4–15.2)

## 2017-09-18 ENCOUNTER — Other Ambulatory Visit: Payer: Self-pay | Admitting: *Deleted

## 2017-09-18 ENCOUNTER — Telehealth: Payer: Self-pay | Admitting: *Deleted

## 2017-09-18 DIAGNOSIS — Z86718 Personal history of other venous thrombosis and embolism: Secondary | ICD-10-CM

## 2017-09-18 NOTE — Telephone Encounter (Signed)
-----   Message from Lequita Asal, MD sent at 09/17/2017  4:03 PM EDT ----- Regarding: Please call patient  INR good at 2.61.  INR steadily increasing over past 4 months.  Any change in diet?  Call if any increased bruising or bleeding.  Continue current dose of Coumadin.  Check INR 2 weeks.  M

## 2017-09-18 NOTE — Telephone Encounter (Signed)
Called patient to inform him of his INR results.  He states there has been no change in his diet.  Also informed him that he will need to have a repeat lab in 2 weeks.  Advised him to continue current dosage of coumadin. Verbalized understanding.

## 2017-10-02 ENCOUNTER — Inpatient Hospital Stay: Payer: Medicare Other | Attending: Hematology and Oncology

## 2017-10-02 ENCOUNTER — Telehealth: Payer: Self-pay | Admitting: *Deleted

## 2017-10-02 DIAGNOSIS — Z86718 Personal history of other venous thrombosis and embolism: Secondary | ICD-10-CM

## 2017-10-02 LAB — PROTIME-INR
INR: 2.22
Prothrombin Time: 24.4 seconds — ABNORMAL HIGH (ref 11.4–15.2)

## 2017-10-02 NOTE — Telephone Encounter (Signed)
-----   Message from Lequita Asal, MD sent at 10/02/2017  1:28 PM EDT ----- Regarding: Please call patient  INR good.  Continue current dose of Coumadin.  M  ----- Message ----- From: Interface, Lab In [place] Sent: 10/02/2017  11:46 AM To: Lequita Asal, MD

## 2017-10-02 NOTE — Telephone Encounter (Signed)
Called patient to inform him that his INR is good. Continue current dosage of coumadin.  Verbalized understanding.

## 2017-10-07 ENCOUNTER — Other Ambulatory Visit: Payer: Self-pay | Admitting: Hematology and Oncology

## 2017-10-15 ENCOUNTER — Inpatient Hospital Stay: Payer: Medicare Other | Attending: Hematology and Oncology

## 2017-10-15 DIAGNOSIS — Z86718 Personal history of other venous thrombosis and embolism: Secondary | ICD-10-CM | POA: Diagnosis present

## 2017-10-15 LAB — PROTIME-INR
INR: 2.1
Prothrombin Time: 23.4 seconds — ABNORMAL HIGH (ref 11.4–15.2)

## 2017-10-16 ENCOUNTER — Telehealth: Payer: Self-pay | Admitting: *Deleted

## 2017-10-16 NOTE — Telephone Encounter (Signed)
-----   Message from [name], NP sent at 10/16/2017  8:54 AM EDT ----- Regarding: FW: Please call patient Can you call patient this morning?   BG ----- Message ----- From: Lequita Asal, MD Sent: 10/15/2017   8:45 PM To: [name], NP Subject: Please call patient                             INR good.  Continue current dose of Coumadin.  M  ----- Message ----- From: Interface, Lab In [place] Sent: 10/15/2017   3:35 PM To: Lequita Asal, MD

## 2017-10-16 NOTE — Telephone Encounter (Signed)
Patient return my phone call- reports that he is taking Coumadin 5 mg orally daily M-Thursday and Sat/Sundays. He takes 7.5 mg daily on Friday's only.  Patient instructed to keep the coumadin dosing the same. Teach back process performed with pt.

## 2017-10-16 NOTE — Telephone Encounter (Signed)
[id number]-left vm requesting pt to return my ph call.

## 2017-11-28 ENCOUNTER — Other Ambulatory Visit: Payer: Medicare Other

## 2017-11-28 ENCOUNTER — Ambulatory Visit: Payer: Medicare Other | Admitting: Hematology and Oncology

## 2017-12-06 ENCOUNTER — Other Ambulatory Visit: Payer: Self-pay

## 2017-12-06 ENCOUNTER — Inpatient Hospital Stay: Payer: Medicare Other | Attending: Hematology and Oncology

## 2017-12-06 ENCOUNTER — Other Ambulatory Visit: Payer: Self-pay | Admitting: Urgent Care

## 2017-12-06 ENCOUNTER — Telehealth: Payer: Self-pay | Admitting: Urgent Care

## 2017-12-06 ENCOUNTER — Inpatient Hospital Stay (HOSPITAL_BASED_OUTPATIENT_CLINIC_OR_DEPARTMENT_OTHER): Payer: Medicare Other | Admitting: Hematology and Oncology

## 2017-12-06 VITALS — BP 171/80 | HR 81 | Temp 97.6°F | Resp 18 | Wt 178.9 lb

## 2017-12-06 DIAGNOSIS — Z86718 Personal history of other venous thrombosis and embolism: Secondary | ICD-10-CM

## 2017-12-06 DIAGNOSIS — Z86711 Personal history of pulmonary embolism: Secondary | ICD-10-CM | POA: Insufficient documentation

## 2017-12-06 DIAGNOSIS — Z79899 Other long term (current) drug therapy: Secondary | ICD-10-CM | POA: Insufficient documentation

## 2017-12-06 DIAGNOSIS — Z7901 Long term (current) use of anticoagulants: Secondary | ICD-10-CM

## 2017-12-06 DIAGNOSIS — R918 Other nonspecific abnormal finding of lung field: Secondary | ICD-10-CM | POA: Insufficient documentation

## 2017-12-06 DIAGNOSIS — Z85828 Personal history of other malignant neoplasm of skin: Secondary | ICD-10-CM

## 2017-12-06 DIAGNOSIS — I82501 Chronic embolism and thrombosis of unspecified deep veins of right lower extremity: Secondary | ICD-10-CM

## 2017-12-06 DIAGNOSIS — R011 Cardiac murmur, unspecified: Secondary | ICD-10-CM | POA: Diagnosis not present

## 2017-12-06 DIAGNOSIS — I2782 Chronic pulmonary embolism: Secondary | ICD-10-CM

## 2017-12-06 DIAGNOSIS — D689 Coagulation defect, unspecified: Secondary | ICD-10-CM | POA: Insufficient documentation

## 2017-12-06 LAB — COMPREHENSIVE METABOLIC PANEL
ALT: 20 U/L (ref 17–63)
AST: 24 U/L (ref 15–41)
Albumin: 4.2 g/dL (ref 3.5–5.0)
Alkaline Phosphatase: 83 U/L (ref 38–126)
Anion gap: 8 (ref 5–15)
BUN: 23 mg/dL — ABNORMAL HIGH (ref 6–20)
CO2: 23 mmol/L (ref 22–32)
Calcium: 9.2 mg/dL (ref 8.9–10.3)
Chloride: 107 mmol/L (ref 101–111)
Creatinine, Ser: 1.63 mg/dL — ABNORMAL HIGH (ref 0.61–1.24)
GFR calc Af Amer: 43 mL/min — ABNORMAL LOW (ref >60)
GFR calc non Af Amer: 37 mL/min — ABNORMAL LOW (ref >60)
Glucose, Bld: 102 mg/dL — ABNORMAL HIGH (ref 65–99)
Potassium: 4.5 mmol/L (ref 3.5–5.1)
Sodium: 138 mmol/L (ref 135–145)
Total Bilirubin: 0.8 mg/dL (ref 0.3–1.2)
Total Protein: 7.5 g/dL (ref 6.5–8.1)

## 2017-12-06 LAB — CBC WITH DIFFERENTIAL/PLATELET
Basophils Absolute: 0 10*3/uL (ref 0–0.1)
Basophils Relative: 1 %
Eosinophils Absolute: 0.2 10*3/uL (ref 0–0.7)
Eosinophils Relative: 3 %
HCT: 44.4 % (ref 40.0–52.0)
Hemoglobin: 15 g/dL (ref 13.0–18.0)
Lymphocytes Relative: 27 %
Lymphs Abs: 1.8 10*3/uL (ref 1.0–3.6)
MCH: 32.8 pg (ref 26.0–34.0)
MCHC: 33.9 g/dL (ref 32.0–36.0)
MCV: 96.7 fL (ref 80.0–100.0)
Monocytes Absolute: 0.7 10*3/uL (ref 0.2–1.0)
Monocytes Relative: 10 %
Neutro Abs: 4 10*3/uL (ref 1.4–6.5)
Neutrophils Relative %: 59 %
Platelets: 215 10*3/uL (ref 150–440)
RBC: 4.59 MIL/uL (ref 4.40–5.90)
RDW: 13.7 % (ref 11.5–14.5)
WBC: 6.7 10*3/uL (ref 3.8–10.6)

## 2017-12-06 LAB — PROTIME-INR
INR: 1.96
Prothrombin Time: 22.2 seconds — ABNORMAL HIGH (ref 11.4–15.2)

## 2017-12-06 NOTE — Progress Notes (Signed)
[facility] day:  12/06/17  Chief Complaint: Gregg Thomas is a 83 y.o. male with a history of left lower extremity deep venous thrombosis (DVT) and pulmonary embolism who is seen for 6 month assessment.  HPI: The patient was last seen in the medical oncology clinic on 05/28/2017.  At that time, he felt good.  He denied any bruising or bleeding.  INR was 2.09 on Coumadin 5 mg daily except for Fridays (7.5 mg). Total weekly Coumadin dose was 37.5 mg.  He has had monthly INR checks.  INR has ranged between 2.09 - 2.61.  INR was 2.10 on 10/15/2017.   During the interim, patient has been doing well. Patient denies any complaints today. He has no shortness of breath or extremity edema.  He denies bruising and bleeding. Patient has not experienced any hematochezia, melena, or gross hematuria. Patient continues on on Coumadin 5 mg daily except for Fridays (7.5 mg).   Patient does note intermittent "tingling" in his LEFT lower extremity when at bed at night. Patient states, "I was playing softball and a fly ball hit me in the calf". He decribes that the incident left and open wound to his leg, however he did not seek treatment. Position changes while in bed alleviates the symptom.    Past Medical History:  Diagnosis Date  . Chronic kidney disease   . Clotting disorder ([place])   . Heart murmur   . Skin cancer     Past Surgical History:  Procedure Laterality Date  . EYE SURGERY Right    benign growth on right eye   . KNEE SURGERY Right   . MELANOMA EXCISION Bilateral    lower back     Family History  Problem Relation Age of Onset  . Stroke Mother   . Cerebral aneurysm Father     Social History:  reports that  has never smoked. he has never used smokeless tobacco. He reports that he does not drink alcohol or use drugs.  He lives in [place]. He is alone today.  Allergies: No Known Allergies  Current Medications: Current Outpatient  Medications  Medication Sig Dispense Refill  . amLODipine (NORVASC) 10 MG tablet Take by mouth.    . Cyanocobalamin (RA VITAMIN B-12 TR) 1000 MCG TBCR Take by mouth.    . enalapril (VASOTEC) 20 MG tablet Take by mouth.    . Grape Seed Extract 50 MG CAPS Take 1,300 mg by mouth.     . warfarin (COUMADIN) 2.5 MG tablet TAKE AS DIRECTED. (Patient taking differently: Take 2.5 mg on Fridays with a 5 mg tablet for total dose of 7.5 mg every Friday) 30 tablet 0  . warfarin (COUMADIN) 5 MG tablet Take 5 mg daily except on Friday, take 7.5 mg 30 tablet 0   No current facility-administered medications for this visit.     Review of Systems:  GENERAL:  Feels "good". No fevers or sweats.  Weight down 1 pound.  PERFORMANCE STATUS (ECOG):  0 HEENT:  No visual changes, sore throat, mouth sores or tenderness. Lungs: No shortness of breath or cough.  No hemoptysis. Cardiac:  No chest pain, palpitations, orthopnea, or PND. GI:  No nausea, vomiting, diarrhea, constipation, melena or hematochezia. GU:  No urgency, frequency, dysuria, or hematuria. Musculoskeletal:  No back pain.  No joint pain.  No muscle tenderness. Extremities:  Hit with softball back of calf.  No pain or swelling. Skin:  No rashes or skin changes.  Neuro:  No headache, numbness or weakness, balance or coordination issues. Endocrine:  No diabetes, thyroid issues, hot flashes or night sweats. Psych:  No mood changes, depression or anxiety. Pain:  No focal pain. Review of systems:  All other systems reviewed and found to be negative.  Physical Exam: Blood pressure (!) 171/80, pulse 81, temperature 97.6 F (36.4 C), temperature source Tympanic, resp. rate 18, weight 178 lb 14.4 oz (81.1 kg). GENERAL:  Well developed, well nourished, gentleman sitting comfortably in the exam room in no acute distress. MENTAL STATUS:  Alert and oriented to person, place and time. HEAD:  Pearline Cables hair.  Normocephalic, atraumatic, face symmetric, no Cushingoid  features. EYES:  Glasses.  Hazel eyes.  Right ectropion.  Pupils equal round and reactive to light and accomodation.  No conjunctivitis or scleral icterus. ENT:  Oropharynx clear without lesion.  Dentures.  Tongue normal. Mucous membranes moist.  RESPIRATORY:  Clear to auscultation without rales, wheezes or rhonchi. CARDIOVASCULAR:  Regular rate and rhythm without murmur, rub or gallop. ABDOMEN:  Soft, non-tender, with active bowel sounds, and no hepatosplenomegaly.  No masses. SKIN:  s/p Mohs surgery right cheek, healing slowly.  Right ectropion s/p repair.  Multiple scalp lesions..No rashes, ulcers or lesions. EXTREMITIES: Tingling in left lower extremity.  No edema, no skin discoloration or tenderness.  No palpable cords. LYMPH NODES: No palpable cervical, supraclavicular, axillary or inguinal adenopathy  NEUROLOGICAL: Unremarkable. PSYCH:  Appropriate.   Appointment on 12/06/2017  Component Date Value Ref Range Status  . Sodium 12/06/2017 138  135 - 145 mmol/L Final  . Potassium 12/06/2017 4.5  3.5 - 5.1 mmol/L Final  . Chloride 12/06/2017 107  101 - 111 mmol/L Final  . CO2 12/06/2017 23  22 - 32 mmol/L Final  . Glucose, Bld 12/06/2017 102* 65 - 99 mg/dL Final  . BUN 12/06/2017 23* 6 - 20 mg/dL Final  . Creatinine, Ser 12/06/2017 1.63* 0.61 - 1.24 mg/dL Final  . Calcium 12/06/2017 9.2  8.9 - 10.3 mg/dL Final  . Total Protein 12/06/2017 7.5  6.5 - 8.1 g/dL Final  . Albumin 12/06/2017 4.2  3.5 - 5.0 g/dL Final  . AST 12/06/2017 24  15 - 41 U/L Final  . ALT 12/06/2017 20  17 - 63 U/L Final  . Alkaline Phosphatase 12/06/2017 83  38 - 126 U/L Final  . Total Bilirubin 12/06/2017 0.8  0.3 - 1.2 mg/dL Final  . GFR calc non Af Amer 12/06/2017 37* >60 mL/min Final  . GFR calc Af Amer 12/06/2017 43* >60 mL/min Final   Comment: (NOTE) The eGFR has been calculated using the CKD EPI equation. This calculation has not been validated in all clinical situations. eGFR's persistently <60 mL/min  signify possible Chronic Kidney Disease.   . Anion gap 12/06/2017 8  5 - 15 Final  . WBC 12/06/2017 6.7  3.8 - 10.6 K/uL Final  . RBC 12/06/2017 4.59  4.40 - 5.90 MIL/uL Final  . Hemoglobin 12/06/2017 15.0  13.0 - 18.0 g/dL Final  . HCT 12/06/2017 44.4  40.0 - 52.0 % Final  . MCV 12/06/2017 96.7  80.0 - 100.0 fL Final  . MCH 12/06/2017 32.8  26.0 - 34.0 pg Final  . MCHC 12/06/2017 33.9  32.0 - 36.0 g/dL Final  . RDW 12/06/2017 13.7  11.5 - 14.5 % Final  . Platelets 12/06/2017 215  150 - 440 K/uL Final  . Neutrophils Relative % 12/06/2017 59  % Final  . Neutro Abs 12/06/2017 4.0  1.4 - 6.5 K/uL Final  . Lymphocytes Relative 12/06/2017 27  % Final  . Lymphs Abs 12/06/2017 1.8  1.0 - 3.6 K/uL Final  . Monocytes Relative 12/06/2017 10  % Final  . Monocytes Absolute 12/06/2017 0.7  0.2 - 1.0 K/uL Final  . Eosinophils Relative 12/06/2017 3  % Final  . Eosinophils Absolute 12/06/2017 0.2  0 - 0.7 K/uL Final  . Basophils Relative 12/06/2017 1  % Final  . Basophils Absolute 12/06/2017 0.0  0 - 0.1 K/uL Final    Assessment:  Gregg Thomas is a 83 y.o. male with a DVT and pulmonary embolism on 06/22/2010 following a long car ride. His lifetime risk of recurrent clot is felt to be 30%. Decision was made to hold Coumadin for procedures then restart Coumadin with a Lovenox bridge.  Hypercoagulable workup revealed the following negative studies: Factor V Leiden, prothrombin gene mutation, beta 2 glycoprotein, protein S free and and total, lupus anticoagulant panel, anti-cardiolipin antibodies. Protein C antigen was slightly low at 58%. Anti-ithrombin III was lower limits of normal at 73%. Additional negative studies included a CEA, PSA, SPEP and ANA.  He was hospitalized on 05/21/2014 with a thigh hematoma. His anticoagulation was reversed with FFP. He had surgery. Hemoglobin dropped to 10.8.  He is on Coumadin 5 mg daily except for Fridays (7.5 mg). Total weekly Coumadin dose is 37.5  mg.   He has a history of small pulmonary nodules on chest CT. Nodules have been stable per radiology (2011 to 2013). He has never smoked,  He had Mohs surgery of the right cheek for a basal cell carcinoma.  He underwent surgery for right ectropion.  Symptomatically, he denies any bruising or bleeding. Exam is unremarkable.  INR is 1.96.  Plan: 1.  Labs today:  CBC with diff, CMP, PT/INR. 2.  Continue current dose of Coumadin. 3.  Call patient with INR. 4.  RTC monthly for PT/INR. 5.  RTC in 6 months for MD assessment and labs (CBC with diff, CMP, PT/INR).  ADDENDUM: INR 1.96 today on current Coumadin regimen. Patient to remain on same dose of Coumadin, with plans to have his levels checked monthly.     Honor Loh, NP  12/06/2017, 2:35 PM   I saw and evaluated the patient, participating in the key portions of the service and reviewing pertinent diagnostic studies and records.  I reviewed the nurse practitioner's note and agree with the findings and the plan.  The assessment and plan were discussed with the patient.  A few questions were asked by the patient and answered.   Nolon Stalls, MD 12/06/2017,2:35 PM

## 2017-12-06 NOTE — Progress Notes (Signed)
Patient does not offer any problems today.  Still taking Coumadin 5 mg every day except 7.5 mg on Friday.

## 2017-12-06 NOTE — Telephone Encounter (Signed)
Call placed to patient to discuss lab results from today. No answer. LMOM to notify him that his PT/INR is within acceptable range; result 1.96. Patient takes Coumadin 5 mg daily except for Fridays when he takes 7.5 mg. He will continue this dose, with scheduled monthly monitoring of his PT/INR. Patient advised to return a call to the cancer center with any questions or concerns.

## 2017-12-10 ENCOUNTER — Other Ambulatory Visit: Payer: Self-pay | Admitting: *Deleted

## 2017-12-10 DIAGNOSIS — I82501 Chronic embolism and thrombosis of unspecified deep veins of right lower extremity: Secondary | ICD-10-CM

## 2017-12-16 ENCOUNTER — Encounter: Payer: Self-pay | Admitting: Hematology and Oncology

## 2017-12-19 ENCOUNTER — Other Ambulatory Visit: Payer: Self-pay | Admitting: Hematology and Oncology

## 2018-01-03 ENCOUNTER — Inpatient Hospital Stay: Payer: Medicare Other | Attending: Hematology and Oncology

## 2018-01-03 DIAGNOSIS — I2782 Chronic pulmonary embolism: Secondary | ICD-10-CM | POA: Diagnosis present

## 2018-01-03 DIAGNOSIS — I82501 Chronic embolism and thrombosis of unspecified deep veins of right lower extremity: Secondary | ICD-10-CM

## 2018-01-03 LAB — PROTIME-INR
INR: 1.86
Prothrombin Time: 21.3 seconds — ABNORMAL HIGH (ref 11.4–15.2)

## 2018-01-06 ENCOUNTER — Other Ambulatory Visit: Payer: Self-pay | Admitting: *Deleted

## 2018-01-06 ENCOUNTER — Telehealth: Payer: Self-pay | Admitting: *Deleted

## 2018-01-06 DIAGNOSIS — I2782 Chronic pulmonary embolism: Secondary | ICD-10-CM

## 2018-01-06 NOTE — Telephone Encounter (Signed)
Called patient to inquire if patient has changed his diet or missed doses of Coumadin.  He states he has not.  Informed him to continue his current dosage of Coumadin and we will recheck his PT-INR in one week.  (Had Honor Loh, NP clarify note from Dr. Mike Gip). Will schedule lab in one week.

## 2018-01-06 NOTE — Telephone Encounter (Signed)
-----   Message from Lequita Asal, MD sent at 01/06/2018  7:37 AM EST ----- Regarding: Please call patient  INR low (1.86).  Ensure no doses missed and diet not changed.  Notes indicate current dose: Coumadin 5 mg daily except for Fridays (7.5 mg). Total weekly Coumadin dose is 37.5 mg.  If so, plan to increase to  Coumadin 7.5 mg twice a week (spread out; Fridays and Tuesdays) and 5 mg 5 days/week.  Total weekly Coumadin dose 40 mg.  Recheck PT/INR 1 week after change.  M  ----- Message ----- From: Interface, Lab In [place] Sent: 01/03/2018   4:41 PM To: Lequita Asal, MD

## 2018-01-13 ENCOUNTER — Inpatient Hospital Stay: Payer: Medicare Other

## 2018-01-13 DIAGNOSIS — I2782 Chronic pulmonary embolism: Secondary | ICD-10-CM

## 2018-01-13 LAB — PROTIME-INR
INR: 1.69
Prothrombin Time: 19.7 seconds — ABNORMAL HIGH (ref 11.4–15.2)

## 2018-01-15 ENCOUNTER — Telehealth: Payer: Self-pay | Admitting: *Deleted

## 2018-01-15 NOTE — Telephone Encounter (Signed)
  Increase Coumadin to 7.5 mg 2 days/week (spread out) and 5 mg 5 days/week.  Recheck INR 1 week after change.  M

## 2018-01-15 NOTE — Telephone Encounter (Signed)
Called patient to inform him that INR is low.  Patient states there have been no dietary changes.  His current dosage of coumadin is 5 mg daily except for Friday which is 7.5 mg.

## 2018-01-15 NOTE — Telephone Encounter (Signed)
-----   Message from Lequita Asal, MD sent at 01/13/2018  3:27 PM EST ----- Regarding: Please call patient  INR is low.  Did he miss any doses?  Change in diet?  What dose is he taking?   M  ----- Message ----- From: Interface, Lab In [place] Sent: 01/13/2018   2:51 PM To: Lequita Asal, MD

## 2018-01-15 NOTE — Telephone Encounter (Signed)
Called patient to inform him that per MD he should increase Coumadin to  7.5 to two days a week (spread out).  He should continue on 5 mg the other 5 days.  Patient verbalized understanding.

## 2018-01-28 ENCOUNTER — Other Ambulatory Visit: Payer: Self-pay | Admitting: Hematology and Oncology

## 2018-01-31 ENCOUNTER — Inpatient Hospital Stay: Payer: Medicare Other | Attending: Hematology and Oncology

## 2018-01-31 DIAGNOSIS — Z86718 Personal history of other venous thrombosis and embolism: Secondary | ICD-10-CM | POA: Diagnosis not present

## 2018-01-31 DIAGNOSIS — Z86711 Personal history of pulmonary embolism: Secondary | ICD-10-CM | POA: Diagnosis not present

## 2018-01-31 DIAGNOSIS — Z7901 Long term (current) use of anticoagulants: Secondary | ICD-10-CM | POA: Diagnosis present

## 2018-01-31 LAB — PROTIME-INR
INR: 2.22
Prothrombin Time: 24.4 seconds — ABNORMAL HIGH (ref 11.4–15.2)

## 2018-02-25 ENCOUNTER — Other Ambulatory Visit: Payer: Self-pay | Admitting: *Deleted

## 2018-02-26 MED ORDER — WARFARIN SODIUM 2.5 MG PO TABS: ORAL_TABLET | ORAL | 0 refills | Status: DC

## 2018-02-26 MED ORDER — WARFARIN SODIUM 5 MG PO TABS: ORAL_TABLET | ORAL | 0 refills | Status: DC

## 2018-02-28 ENCOUNTER — Inpatient Hospital Stay: Payer: Medicare Other | Attending: Hematology and Oncology

## 2018-02-28 DIAGNOSIS — I82501 Chronic embolism and thrombosis of unspecified deep veins of right lower extremity: Secondary | ICD-10-CM | POA: Diagnosis present

## 2018-02-28 DIAGNOSIS — Z7901 Long term (current) use of anticoagulants: Secondary | ICD-10-CM | POA: Diagnosis present

## 2018-02-28 LAB — PROTIME-INR
INR: 3.45
Prothrombin Time: 34.5 seconds — ABNORMAL HIGH (ref 11.4–15.2)

## 2018-03-05 ENCOUNTER — Telehealth: Payer: Self-pay | Admitting: *Deleted

## 2018-03-05 ENCOUNTER — Other Ambulatory Visit: Payer: Self-pay | Admitting: *Deleted

## 2018-03-05 DIAGNOSIS — I82501 Chronic embolism and thrombosis of unspecified deep veins of right lower extremity: Secondary | ICD-10-CM

## 2018-03-05 NOTE — Telephone Encounter (Signed)
-----   Message from [name], NP sent at 03/05/2018 10:44 AM EST ----- Regarding: INR Need to find out if he has taken any extra doses of medication or had any diarrhea. INR 3.45. He has been fairly stable, so I don't want to make much of a change.   1. Confirm what he is taking. Should be 5 mg daily except on Friday which is 7.5 mg.   If that is what he is taking.... 1. Change dose to 5 mg every day and plan on rechecking INR on Monday. May need to make further changes.   Thanks,  Gregg Thomas

## 2018-03-05 NOTE — Telephone Encounter (Signed)
Called patient to confirm his coumadin dosage.  Patient has been taking 5 mg 5 days a week and 7.5 mg 2 days a week.  Instructed him, per MD, to begin taking 5 mg every day and we will recheck his labs on Monday.  Patient repeated back to me.

## 2018-03-05 NOTE — Telephone Encounter (Signed)
Called patient and made him aware of time of his Lab  appt on 03/10/18.

## 2018-03-05 NOTE — Telephone Encounter (Signed)
-----   Message from [name], NP sent at 03/05/2018 10:44 AM EST ----- Regarding: INR Need to find out if he has taken any extra doses of medication or had any diarrhea. INR 3.45. He has been fairly stable, so I don't want to make much of a change.   1. Confirm what he is taking. Should be 5 mg daily except on Friday which is 7.5 mg.   If that is what he is taking.... 1. Change dose to 5 mg every day and plan on rechecking INR on Monday. May need to make further changes.   Thanks,  Gaspar Bidding

## 2018-03-06 ENCOUNTER — Other Ambulatory Visit: Payer: Self-pay | Admitting: Urgent Care

## 2018-03-06 MED ORDER — WARFARIN SODIUM 2.5 MG PO TABS: ORAL_TABLET | ORAL | 0 refills | Status: DC

## 2018-03-06 MED ORDER — WARFARIN SODIUM 5 MG PO TABS: ORAL_TABLET | ORAL | 0 refills | Status: DC

## 2018-03-10 ENCOUNTER — Telehealth: Payer: Self-pay | Admitting: *Deleted

## 2018-03-10 ENCOUNTER — Other Ambulatory Visit: Payer: Self-pay | Admitting: *Deleted

## 2018-03-10 ENCOUNTER — Inpatient Hospital Stay: Payer: Medicare Other

## 2018-03-10 DIAGNOSIS — I82501 Chronic embolism and thrombosis of unspecified deep veins of right lower extremity: Secondary | ICD-10-CM

## 2018-03-10 LAB — PROTIME-INR
INR: 2.91
Prothrombin Time: 30.2 seconds — ABNORMAL HIGH (ref 11.4–15.2)

## 2018-03-10 NOTE — Telephone Encounter (Signed)
Called patient to inform him to continue current coumadin dosage.  Will recheck in 1-2 weeks.  Verbalized understanding.

## 2018-03-12 ENCOUNTER — Telehealth: Payer: Self-pay | Admitting: *Deleted

## 2018-03-12 NOTE — Telephone Encounter (Signed)
Per MD, schedule lab in 1-2 weeks Scheduled 1 week out appt is 03/17/18 @ 3:00 Patient is aware of date and time.

## 2018-03-17 ENCOUNTER — Inpatient Hospital Stay: Payer: Medicare Other

## 2018-03-17 DIAGNOSIS — I82501 Chronic embolism and thrombosis of unspecified deep veins of right lower extremity: Secondary | ICD-10-CM

## 2018-03-17 LAB — PROTIME-INR
INR: 2.74
Prothrombin Time: 28.8 seconds — ABNORMAL HIGH (ref 11.4–15.2)

## 2018-03-18 ENCOUNTER — Other Ambulatory Visit: Payer: Self-pay | Admitting: *Deleted

## 2018-03-18 ENCOUNTER — Telehealth: Payer: Self-pay | Admitting: *Deleted

## 2018-03-18 DIAGNOSIS — I82501 Chronic embolism and thrombosis of unspecified deep veins of right lower extremity: Secondary | ICD-10-CM

## 2018-03-18 NOTE — Telephone Encounter (Signed)
Called patient to inform him his INR is good.  Continue current dose of coumadin.  Will recheck in 2 weeks.  If still good we will go back to checking once a month.

## 2018-03-20 ENCOUNTER — Other Ambulatory Visit: Payer: Self-pay | Admitting: *Deleted

## 2018-03-20 MED ORDER — WARFARIN SODIUM 5 MG PO TABS: ORAL_TABLET | ORAL | 0 refills | Status: DC

## 2018-03-25 ENCOUNTER — Other Ambulatory Visit: Payer: Self-pay | Admitting: Urgent Care

## 2018-03-25 ENCOUNTER — Other Ambulatory Visit: Payer: Self-pay | Admitting: *Deleted

## 2018-03-25 MED ORDER — WARFARIN SODIUM 5 MG PO TABS: ORAL_TABLET | ORAL | 0 refills | Status: DC

## 2018-03-28 ENCOUNTER — Other Ambulatory Visit: Payer: Medicare Other

## 2018-03-31 ENCOUNTER — Other Ambulatory Visit: Payer: Self-pay | Admitting: Urgent Care

## 2018-03-31 ENCOUNTER — Other Ambulatory Visit: Payer: Self-pay | Admitting: *Deleted

## 2018-03-31 ENCOUNTER — Telehealth: Payer: Self-pay | Admitting: *Deleted

## 2018-03-31 ENCOUNTER — Inpatient Hospital Stay: Payer: Medicare Other | Attending: Hematology and Oncology

## 2018-03-31 DIAGNOSIS — I82401 Acute embolism and thrombosis of unspecified deep veins of right lower extremity: Secondary | ICD-10-CM

## 2018-03-31 DIAGNOSIS — Z86718 Personal history of other venous thrombosis and embolism: Secondary | ICD-10-CM | POA: Insufficient documentation

## 2018-03-31 DIAGNOSIS — I82501 Chronic embolism and thrombosis of unspecified deep veins of right lower extremity: Secondary | ICD-10-CM

## 2018-03-31 LAB — PROTIME-INR
INR: 2.15
Prothrombin Time: 23.8 seconds — ABNORMAL HIGH (ref 11.4–15.2)

## 2018-03-31 NOTE — Telephone Encounter (Signed)
Called patient and LVM to inform him that his  INR is good. MD recommends he continue current dose of coumadin.  Will recheck in 1 month

## 2018-03-31 NOTE — Telephone Encounter (Signed)
-----   Message from Lequita Asal, MD sent at 03/31/2018  4:38 PM EDT ----- Regarding: Please call patient  INR good.  Continue current dose of Coumadin.  Check INR in 1 month.  M  ----- Message ----- From: Interface, Lab In [place] Sent: 03/31/2018   3:53 PM To: Lequita Asal, MD

## 2018-04-02 ENCOUNTER — Other Ambulatory Visit: Payer: Self-pay | Admitting: *Deleted

## 2018-04-02 NOTE — Telephone Encounter (Signed)
Too early for refill  

## 2018-04-21 ENCOUNTER — Other Ambulatory Visit: Payer: Self-pay | Admitting: Urgent Care

## 2018-04-25 ENCOUNTER — Inpatient Hospital Stay: Payer: Medicare Other

## 2018-04-30 ENCOUNTER — Inpatient Hospital Stay: Payer: Medicare Other | Attending: Hematology and Oncology

## 2018-04-30 ENCOUNTER — Telehealth: Payer: Self-pay | Admitting: *Deleted

## 2018-04-30 DIAGNOSIS — Z7901 Long term (current) use of anticoagulants: Secondary | ICD-10-CM | POA: Diagnosis present

## 2018-04-30 DIAGNOSIS — Z86718 Personal history of other venous thrombosis and embolism: Secondary | ICD-10-CM | POA: Insufficient documentation

## 2018-04-30 DIAGNOSIS — Z86711 Personal history of pulmonary embolism: Secondary | ICD-10-CM | POA: Insufficient documentation

## 2018-04-30 DIAGNOSIS — I82401 Acute embolism and thrombosis of unspecified deep veins of right lower extremity: Secondary | ICD-10-CM

## 2018-04-30 LAB — PROTIME-INR
INR: 2.06
Prothrombin Time: 23 seconds — ABNORMAL HIGH (ref 11.4–15.2)

## 2018-04-30 NOTE — Telephone Encounter (Signed)
-----   Message from Lequita Asal, MD sent at 04/30/2018  2:07 PM EDT ----- Regarding: Please call patient   INR good.  Continue current dose of Coumadin.  Check INR in 1 month.  M  ----- Message ----- From: Interface, Lab In [place] Sent: 04/30/2018   2:01 PM To: Lequita Asal, MD

## 2018-04-30 NOTE — Telephone Encounter (Signed)
Called patient to inform him that his INR is good.  MD recommends continuing current dosage of coumadin.  Will recheck INR in one month. Patient verbalized understanding.

## 2018-06-06 ENCOUNTER — Inpatient Hospital Stay: Payer: Medicare Other | Attending: Hematology and Oncology

## 2018-06-06 ENCOUNTER — Inpatient Hospital Stay (HOSPITAL_BASED_OUTPATIENT_CLINIC_OR_DEPARTMENT_OTHER): Payer: Medicare Other | Admitting: Hematology and Oncology

## 2018-06-06 ENCOUNTER — Encounter: Payer: Self-pay | Admitting: Hematology and Oncology

## 2018-06-06 VITALS — BP 114/67 | HR 60 | Temp 97.1°F | Resp 18 | Ht 67.0 in | Wt 178.7 lb

## 2018-06-06 DIAGNOSIS — I82501 Chronic embolism and thrombosis of unspecified deep veins of right lower extremity: Secondary | ICD-10-CM

## 2018-06-06 DIAGNOSIS — Z79899 Other long term (current) drug therapy: Secondary | ICD-10-CM | POA: Insufficient documentation

## 2018-06-06 DIAGNOSIS — Z85828 Personal history of other malignant neoplasm of skin: Secondary | ICD-10-CM

## 2018-06-06 DIAGNOSIS — Z86711 Personal history of pulmonary embolism: Secondary | ICD-10-CM

## 2018-06-06 DIAGNOSIS — Z7901 Long term (current) use of anticoagulants: Secondary | ICD-10-CM

## 2018-06-06 DIAGNOSIS — M7989 Other specified soft tissue disorders: Secondary | ICD-10-CM

## 2018-06-06 DIAGNOSIS — R200 Anesthesia of skin: Secondary | ICD-10-CM | POA: Insufficient documentation

## 2018-06-06 DIAGNOSIS — Z86718 Personal history of other venous thrombosis and embolism: Secondary | ICD-10-CM

## 2018-06-06 DIAGNOSIS — I2782 Chronic pulmonary embolism: Secondary | ICD-10-CM

## 2018-06-06 LAB — COMPREHENSIVE METABOLIC PANEL
ALT: 20 U/L (ref 17–63)
AST: 25 U/L (ref 15–41)
Albumin: 4 g/dL (ref 3.5–5.0)
Alkaline Phosphatase: 82 U/L (ref 38–126)
Anion gap: 8 (ref 5–15)
BUN: 33 mg/dL — ABNORMAL HIGH (ref 6–20)
CO2: 20 mmol/L — ABNORMAL LOW (ref 22–32)
Calcium: 9.1 mg/dL (ref 8.9–10.3)
Chloride: 111 mmol/L (ref 101–111)
Creatinine, Ser: 2.39 mg/dL — ABNORMAL HIGH (ref 0.61–1.24)
GFR calc Af Amer: 27 mL/min — ABNORMAL LOW (ref >60)
GFR calc non Af Amer: 23 mL/min — ABNORMAL LOW (ref >60)
Glucose, Bld: 94 mg/dL (ref 65–99)
Potassium: 4.6 mmol/L (ref 3.5–5.1)
Sodium: 139 mmol/L (ref 135–145)
Total Bilirubin: 1 mg/dL (ref 0.3–1.2)
Total Protein: 7.1 g/dL (ref 6.5–8.1)

## 2018-06-06 LAB — CBC WITH DIFFERENTIAL/PLATELET
Basophils Absolute: 0.1 10*3/uL (ref 0–0.1)
Basophils Relative: 1 %
Eosinophils Absolute: 0.3 10*3/uL (ref 0–0.7)
Eosinophils Relative: 4 %
HCT: 43.3 % (ref 40.0–52.0)
Hemoglobin: 15 g/dL (ref 13.0–18.0)
Lymphocytes Relative: 33 %
Lymphs Abs: 2 10*3/uL (ref 1.0–3.6)
MCH: 33.6 pg (ref 26.0–34.0)
MCHC: 34.6 g/dL (ref 32.0–36.0)
MCV: 96.9 fL (ref 80.0–100.0)
Monocytes Absolute: 0.8 10*3/uL (ref 0.2–1.0)
Monocytes Relative: 13 %
Neutro Abs: 2.9 10*3/uL (ref 1.4–6.5)
Neutrophils Relative %: 49 %
Platelets: 202 10*3/uL (ref 150–440)
RBC: 4.47 MIL/uL (ref 4.40–5.90)
RDW: 13.9 % (ref 11.5–14.5)
WBC: 6 10*3/uL (ref 3.8–10.6)

## 2018-06-06 LAB — PROTIME-INR
INR: 2.25
Prothrombin Time: 24.7 seconds — ABNORMAL HIGH (ref 11.4–15.2)

## 2018-06-06 NOTE — Progress Notes (Signed)
[facility] day:  06/06/18  Chief Complaint: Gregg Thomas is a 84 y.o. male with a history of left lower extremity deep venous thrombosis (DVT) and pulmonary embolism who is seen for 6 month assessment.  HPI: The patient was last seen in the medical oncology clinic on 12/06/2017.  At that time, he denied any bruising or bleeding. Exam was unremarkable.  INR was 1.96.  He continued Coumadin 5 mg daily except for Fridays (7.5 mg). Total weekly Coumadin dose was 37.5 mg.  He has had monthly INR checks.  INR has ranged between 1.69 - 3.45.  INR was 2.06 on 04/30/2018.   During the interim, patient is doing well today, and does not express any acute concerns. Patient denies B symptoms. He has not experienced any significant interval infections. Patient denies any extremity swelling or shortness of breath. He notes transient numbness and swelling in her LEFT foot.   Patient is scheduled to see nephrology Holley Raring) on 06/09/2018.  Patient notes that he is eating well. Weight is stable. Patient denies pain in the clinic today.    Past Medical History:  Diagnosis Date  . Chronic kidney disease   . Clotting disorder ([place])   . Heart murmur   . Skin cancer     Past Surgical History:  Procedure Laterality Date  . EYE SURGERY Right    benign growth on right eye   . KNEE SURGERY Right   . MELANOMA EXCISION Bilateral    lower back     Family History  Problem Relation Age of Onset  . Stroke Mother   . Cerebral aneurysm Father     Social History:  reports that he has never smoked. He has never used smokeless tobacco. He reports that he does not drink alcohol or use drugs.  He lives in [place]. He is accompanied by his wife today.  Allergies: No Known Allergies  Current Medications: Current Outpatient Medications  Medication Sig Dispense Refill  . amLODipine (NORVASC) 10 MG tablet Take by mouth.    . Cyanocobalamin (RA VITAMIN B-12  TR) 1000 MCG TBCR Take by mouth.    . enalapril (VASOTEC) 20 MG tablet Take by mouth.    . Grape Seed Extract 50 MG CAPS Take 1,300 mg by mouth.     . warfarin (COUMADIN) 5 MG tablet Take 5 mg tablet by mouth daily. 30 tablet 0   No current facility-administered medications for this visit.     Review of Systems  Constitutional: Negative for diaphoresis, fever, malaise/fatigue and weight loss (stable).       "I feel pretty good".  HENT: Negative.   Eyes: Negative for pain and redness (RIGHT eye - following surgery 2 years ago).  Respiratory: Negative for cough, hemoptysis, sputum production and shortness of breath.   Cardiovascular: Positive for leg swelling (LEFT foot). Negative for chest pain, palpitations, orthopnea and PND.  Gastrointestinal: Negative for abdominal pain, blood in stool, constipation, diarrhea, melena, nausea and vomiting.  Genitourinary: Negative for dysuria, frequency, hematuria and urgency.  Musculoskeletal: Negative for back pain, falls, joint pain and myalgias.  Skin: Negative for itching and rash.  Neurological: Positive for tingling (numbness - LEFT foot). Negative for dizziness, tremors, weakness and headaches.  Endo/Heme/Allergies: Does not bruise/bleed easily.  Psychiatric/Behavioral: Negative for depression, memory loss and suicidal ideas. The patient is not nervous/anxious and does not have insomnia.   All other systems reviewed and are negative.  Performance status (ECOG): 0 -  Asymptomatic  Physical Exam: Blood pressure 114/67, pulse 60, temperature (!) 97.1 F (36.2 C), temperature source Tympanic, resp. rate 18, height '5\' 7"'  (1.702 m), weight 178 lb 11.2 oz (81.1 kg), SpO2 97 %. GENERAL:  Well developed, well nourished, gentleman sitting comfortably in the exam room in no acute distress. MENTAL STATUS:  Alert and oriented to person, place and time. HEAD:  Pearline Cables hair.  Normocephalic, atraumatic, face symmetric, no Cushingoid features. EYES:  Glasses.   Hazel eyes.  Right ectropion.  Pupils equal round and reactive to light and accomodation.  No conjunctivitis or scleral icterus. ENT:  Oropharynx clear without lesion.  Tongue normal. Mucous membranes moist.  RESPIRATORY:  Clear to auscultation without rales, wheezes or rhonchi. CARDIOVASCULAR:  Regular rate and rhythm without murmur, rub or gallop. ABDOMEN:  Soft, non-tender, with active bowel sounds, and no hepatosplenomegaly.  No masses. SKIN:  s/p Moh's surgery right cheek.  Right ectropion s/p repair.  No rashes, ulcers or lesions. EXTREMITIES: No edema, no skin discoloration or tenderness.  No palpable cords. LYMPH NODES: No palpable cervical, supraclavicular, axillary or inguinal adenopathy  NEUROLOGICAL: Unremarkable. PSYCH:  Appropriate.    Appointment on 06/06/2018  Component Date Value Ref Range Status  . WBC 06/06/2018 6.0  3.8 - 10.6 K/uL Final  . RBC 06/06/2018 4.47  4.40 - 5.90 MIL/uL Final  . Hemoglobin 06/06/2018 15.0  13.0 - 18.0 g/dL Final  . HCT 06/06/2018 43.3  40.0 - 52.0 % Final  . MCV 06/06/2018 96.9  80.0 - 100.0 fL Final  . MCH 06/06/2018 33.6  26.0 - 34.0 pg Final  . MCHC 06/06/2018 34.6  32.0 - 36.0 g/dL Final  . RDW 06/06/2018 13.9  11.5 - 14.5 % Final  . Platelets 06/06/2018 202  150 - 440 K/uL Final  . Neutrophils Relative % 06/06/2018 49  % Final  . Neutro Abs 06/06/2018 2.9  1.4 - 6.5 K/uL Final  . Lymphocytes Relative 06/06/2018 33  % Final  . Lymphs Abs 06/06/2018 2.0  1.0 - 3.6 K/uL Final  . Monocytes Relative 06/06/2018 13  % Final  . Monocytes Absolute 06/06/2018 0.8  0.2 - 1.0 K/uL Final  . Eosinophils Relative 06/06/2018 4  % Final  . Eosinophils Absolute 06/06/2018 0.3  0 - 0.7 K/uL Final  . Basophils Relative 06/06/2018 1  % Final  . Basophils Absolute 06/06/2018 0.1  0 - 0.1 K/uL Final   Performed at [facility], [address] [place], [place] [address]  . Sodium 06/06/2018 139  135 - 145 mmol/L Final  . Potassium 06/06/2018 4.6   3.5 - 5.1 mmol/L Final  . Chloride 06/06/2018 111  101 - 111 mmol/L Final  . CO2 06/06/2018 20* 22 - 32 mmol/L Final  . Glucose, Bld 06/06/2018 94  65 - 99 mg/dL Final  . BUN 06/06/2018 33* 6 - 20 mg/dL Final  . Creatinine, Ser 06/06/2018 2.39* 0.61 - 1.24 mg/dL Final  . Calcium 06/06/2018 9.1  8.9 - 10.3 mg/dL Final  . Total Protein 06/06/2018 7.1  6.5 - 8.1 g/dL Final  . Albumin 06/06/2018 4.0  3.5 - 5.0 g/dL Final  . AST 06/06/2018 25  15 - 41 U/L Final  . ALT 06/06/2018 20  17 - 63 U/L Final  . Alkaline Phosphatase 06/06/2018 82  38 - 126 U/L Final  . Total Bilirubin 06/06/2018 1.0  0.3 - 1.2 mg/dL Final  . GFR calc non Af Amer 06/06/2018 23* >60 mL/min Final  . GFR  calc Af Amer 06/06/2018 27* >60 mL/min Final   Comment: (NOTE) The eGFR has been calculated using the CKD EPI equation. This calculation has not been validated in all clinical situations. eGFR's persistently <60 mL/min signify possible Chronic Kidney Disease.   Georgiann Hahn gap 06/06/2018 8  5 - 15 Final   Performed at [facility], [address] [place], [place] [address]    Assessment:  Damarie Schoolfield is a 84 y.o. male with a DVT and pulmonary embolism on 06/22/2010 following a long car ride. His lifetime risk of recurrent clot is felt to be 30%. Decision was made to hold Coumadin for procedures then restart Coumadin with a Lovenox bridge.  Hypercoagulable workup revealed the following negative studies: Factor V Leiden, prothrombin gene mutation, beta 2 glycoprotein, protein S free and and total, lupus anticoagulant panel, anti-cardiolipin antibodies. Protein C antigen was slightly low at 58%. Anti-ithrombin III was lower limits of normal at 73%. Additional negative studies included a CEA, PSA, SPEP and ANA.  He was hospitalized on 05/21/2014 with a thigh hematoma. His anticoagulation was reversed with FFP. He had surgery. Hemoglobin dropped to 10.8.  He is on Coumadin 5 mg daily except for Fridays  (7.5 mg). Total weekly Coumadin dose is 37.5 mg.   He has a history of small pulmonary nodules on chest CT. Nodules have been stable per radiology (2011 to 2013). He has never smoked,  He had Mohs surgery of the right cheek for a basal cell carcinoma.  He underwent surgery for right ectropion.  Symptomatically, he denies any bruising or bleeding. Exam is unremarkable.  INR is 2.25.  Plan: 1.  Labs today:  CBC with diff, CMP, PT/INR. 2.  Continue current dose of Coumadin. 3.  Call patient with INR. 4.  RTC monthly for PT/INR. 5.  RTC in 6 months for MD assessment and labs (CBC with diff, CMP, PT/INR).    Honor Loh, NP  06/06/2018, 3:25 PM   I saw and evaluated the patient, participating in the key portions of the service and reviewing pertinent diagnostic studies and records.  I reviewed the nurse practitioner's note and agree with the findings and the plan.  The assessment and plan were discussed with the patient.  A few questions were asked by the patient and answered.   Nolon Stalls, MD 06/06/2018,3:25 PM

## 2018-06-06 NOTE — Progress Notes (Signed)
No new changes noted today 

## 2018-06-09 ENCOUNTER — Telehealth: Payer: Self-pay | Admitting: *Deleted

## 2018-06-09 NOTE — Telephone Encounter (Signed)
Contacted patient- patient currently on coumadin 5 mg tablet by mouth daily. Pt instructed to keep taking same coumadin dosing.

## 2018-06-09 NOTE — Telephone Encounter (Signed)
-----   Message from [name], NP sent at 06/06/2018  6:02 PM EDT ----- Regarding: Call patient... INR stable on current dose of Warfarin. Please have patient continue current dose.   Gaspar Bidding

## 2018-06-11 ENCOUNTER — Other Ambulatory Visit: Payer: Self-pay | Admitting: Urgent Care

## 2018-06-11 MED ORDER — WARFARIN SODIUM 5 MG PO TABS: 5.0000 mg | ORAL_TABLET | Freq: Every day | ORAL | 0 refills | Status: DC

## 2018-06-18 ENCOUNTER — Ambulatory Visit: Payer: Self-pay | Admitting: Family Medicine

## 2018-07-04 ENCOUNTER — Inpatient Hospital Stay: Payer: Medicare Other | Attending: Hematology and Oncology

## 2018-07-04 DIAGNOSIS — Z86718 Personal history of other venous thrombosis and embolism: Secondary | ICD-10-CM | POA: Diagnosis present

## 2018-07-04 DIAGNOSIS — Z86711 Personal history of pulmonary embolism: Secondary | ICD-10-CM | POA: Diagnosis present

## 2018-07-04 DIAGNOSIS — Z7901 Long term (current) use of anticoagulants: Secondary | ICD-10-CM | POA: Insufficient documentation

## 2018-07-04 LAB — PROTIME-INR
INR: 2.62
PROTHROMBIN TIME: 27.8 s — AB (ref 11.4–15.2)

## 2018-07-07 ENCOUNTER — Telehealth: Payer: Self-pay | Admitting: *Deleted

## 2018-07-07 ENCOUNTER — Other Ambulatory Visit: Payer: Self-pay | Admitting: *Deleted

## 2018-07-07 DIAGNOSIS — I82501 Chronic embolism and thrombosis of unspecified deep veins of right lower extremity: Secondary | ICD-10-CM

## 2018-07-07 NOTE — Telephone Encounter (Signed)
Called patient to inform him that his INR is stable.  Per MD continue with current dose of coumadin.  Will recheck in one month.

## 2018-07-07 NOTE — Telephone Encounter (Signed)
-----   Message from [name], NP sent at 07/04/2018  6:49 PM EDT ----- Regarding: Call INR stable on current dose of warfarin. Continue as previously prescribed, with plans to recheck INR in 1 month.   Gregg Thomas  ----- Message ----- From: Interface, Lab In [place] Sent: 07/04/2018   1:43 PM To: [name], NP

## 2018-07-08 ENCOUNTER — Other Ambulatory Visit: Payer: Self-pay | Admitting: Urgent Care

## 2018-07-08 MED ORDER — WARFARIN SODIUM 5 MG PO TABS: 5.0000 mg | ORAL_TABLET | Freq: Every day | ORAL | 2 refills | Status: DC

## 2018-07-22 ENCOUNTER — Other Ambulatory Visit: Payer: Self-pay | Admitting: Urgent Care

## 2018-08-01 ENCOUNTER — Inpatient Hospital Stay: Payer: Medicare Other | Attending: Hematology and Oncology

## 2018-08-01 DIAGNOSIS — Z86718 Personal history of other venous thrombosis and embolism: Secondary | ICD-10-CM | POA: Diagnosis not present

## 2018-08-01 DIAGNOSIS — Z7901 Long term (current) use of anticoagulants: Secondary | ICD-10-CM | POA: Insufficient documentation

## 2018-08-01 DIAGNOSIS — Z86711 Personal history of pulmonary embolism: Secondary | ICD-10-CM | POA: Diagnosis present

## 2018-08-01 LAB — PROTIME-INR
INR: 1.98
Prothrombin Time: 22.3 seconds — ABNORMAL HIGH (ref 11.4–15.2)

## 2018-08-14 ENCOUNTER — Other Ambulatory Visit: Payer: Self-pay | Admitting: *Deleted

## 2018-08-14 NOTE — Telephone Encounter (Signed)
Prescription not needed.

## 2018-08-27 ENCOUNTER — Other Ambulatory Visit: Payer: Self-pay | Admitting: Hematology and Oncology

## 2018-08-29 NOTE — Telephone Encounter (Signed)
[facility] contacted. Prescription updated to 32 tablets monthly so patient can cut in half on days he needs to take 7.5 mg.

## 2018-09-02 ENCOUNTER — Inpatient Hospital Stay: Payer: Medicare Other | Attending: Hematology and Oncology

## 2018-09-02 ENCOUNTER — Other Ambulatory Visit: Payer: Self-pay

## 2018-09-02 DIAGNOSIS — Z7901 Long term (current) use of anticoagulants: Secondary | ICD-10-CM | POA: Diagnosis not present

## 2018-09-02 DIAGNOSIS — Z86711 Personal history of pulmonary embolism: Secondary | ICD-10-CM | POA: Insufficient documentation

## 2018-09-02 LAB — PROTIME-INR
INR: 2.83
Prothrombin Time: 29.5 seconds — ABNORMAL HIGH (ref 11.4–15.2)

## 2018-09-03 NOTE — Telephone Encounter (Signed)
Prescription changed to 32 tablets monthly to give patient complete coverage without having to prescribe 2.5 mg tablets.  Prescription called to pharmacy by Moishe Spice, RN.

## 2018-09-04 ENCOUNTER — Ambulatory Visit (INDEPENDENT_AMBULATORY_CARE_PROVIDER_SITE_OTHER): Payer: Medicare Other

## 2018-09-04 VITALS — BP 120/62 | HR 70 | Temp 97.8°F | Resp 16 | Ht 66.0 in | Wt 174.2 lb

## 2018-09-04 DIAGNOSIS — Z0000 Encounter for general adult medical examination without abnormal findings: Secondary | ICD-10-CM

## 2018-09-04 DIAGNOSIS — Z23 Encounter for immunization: Secondary | ICD-10-CM | POA: Diagnosis not present

## 2018-09-04 NOTE — Patient Instructions (Addendum)
Gregg Thomas , Thank you for taking time to come for your Medicare Wellness Visit. I appreciate your ongoing commitment to your health goals. Please review the following plan we discussed and let me know if I can assist you in the future.   Screening recommendations/referrals: Colonoscopy: no longer required  Recommended yearly ophthalmology/optometry visit for glaucoma screening and checkup Recommended yearly dental visit for hygiene and checkup  Vaccinations: Influenza vaccine: done today  Pneumococcal vaccine: completed Tdap vaccine: due, check with your insurance company for coverage  Shingles vaccine: shingrix eligible, check with your insurance company for coverage     Advanced directives: Please bring a copy of your health care power of attorney and living will to the office at your convenience.  Conditions/risks identified: recommend drinking at least 6-8 glasses of water a day   Next appointment: Follow up in one year for your annual wellness exam.   Preventive Care 65 Years and Older, Male Preventive care refers to lifestyle choices and visits with your health care provider that can promote health and wellness. What does preventive care include?  A yearly physical exam. This is also called an annual well check.  Dental exams once or twice a year.  Routine eye exams. Ask your health care provider how often you should have your eyes checked.  Personal lifestyle choices, including:  Daily care of your teeth and gums.  Regular physical activity.  Eating a healthy diet.  Avoiding tobacco and drug use.  Limiting alcohol use.  Practicing safe sex.  Taking low doses of aspirin every day.  Taking vitamin and mineral supplements as recommended by your health care provider. What happens during an annual well check? The services and screenings done by your health care provider during your annual well check will depend on your age, overall health, lifestyle risk  factors, and family history of disease. Counseling  Your health care provider may ask you questions about your:  Alcohol use.  Tobacco use.  Drug use.  Emotional well-being.  Home and relationship well-being.  Sexual activity.  Eating habits.  History of falls.  Memory and ability to understand (cognition).  Work and work Statistician. Screening  You may have the following tests or measurements:  Height, weight, and BMI.  Blood pressure.  Lipid and cholesterol levels. These may be checked every 5 years, or more frequently if you are over 11 years old.  Skin check.  Lung cancer screening. You may have this screening every year starting at age 49 if you have a 30-pack-year history of smoking and currently smoke or have quit within the past 15 years.  Fecal occult blood test (FOBT) of the stool. You may have this test every year starting at age 59.  Flexible sigmoidoscopy or colonoscopy. You may have a sigmoidoscopy every 5 years or a colonoscopy every 10 years starting at age 43.  Prostate cancer screening. Recommendations will vary depending on your family history and other risks.  Hepatitis C blood test.  Hepatitis B blood test.  Sexually transmitted disease (STD) testing.  Diabetes screening. This is done by checking your blood sugar (glucose) after you have not eaten for a while (fasting). You may have this done every 1-3 years.  Abdominal aortic aneurysm (AAA) screening. You may need this if you are a current or former smoker.  Osteoporosis. You may be screened starting at age 73 if you are at high risk. Talk with your health care provider about your test results, treatment options, and if necessary,  the need for more tests. Vaccines  Your health care provider may recommend certain vaccines, such as:  Influenza vaccine. This is recommended every year.  Tetanus, diphtheria, and acellular pertussis (Tdap, Td) vaccine. You may need a Td booster every 10  years.  Zoster vaccine. You may need this after age 66.  Pneumococcal 13-valent conjugate (PCV13) vaccine. One dose is recommended after age 8.  Pneumococcal polysaccharide (PPSV23) vaccine. One dose is recommended after age 75. Talk to your health care provider about which screenings and vaccines you need and how often you need them. This information is not intended to replace advice given to you by your health care provider. Make sure you discuss any questions you have with your health care provider. Document Released: 01/13/2016 Document Revised: 09/05/2016 Document Reviewed: 10/18/2015 Elsevier Interactive Patient Education  2017 [place] Prevention in the Home Falls can cause injuries. They can happen to people of all ages. There are many things you can do to make your home safe and to help prevent falls. What can I do on the outside of my home?  Regularly fix the edges of walkways and driveways and fix any cracks.  Remove anything that might make you trip as you walk through a door, such as a raised step or threshold.  Trim any bushes or trees on the path to your home.  Use bright outdoor lighting.  Clear any walking paths of anything that might make someone trip, such as rocks or tools.  Regularly check to see if handrails are loose or broken. Make sure that both sides of any steps have handrails.  Any raised decks and porches should have guardrails on the edges.  Have any leaves, snow, or ice cleared regularly.  Use sand or salt on walking paths during winter.  Clean up any spills in your garage right away. This includes oil or grease spills. What can I do in the bathroom?  Use night lights.  Install grab bars by the toilet and in the tub and shower. Do not use towel bars as grab bars.  Use non-skid mats or decals in the tub or shower.  If you need to sit down in the shower, use a plastic, non-slip stool.  Keep the floor dry. Clean up any water that  spills on the floor as soon as it happens.  Remove soap buildup in the tub or shower regularly.  Attach bath mats securely with double-sided non-slip rug tape.  Do not have throw rugs and other things on the floor that can make you trip. What can I do in the bedroom?  Use night lights.  Make sure that you have a light by your bed that is easy to reach.  Do not use any sheets or blankets that are too big for your bed. They should not hang down onto the floor.  Have a firm chair that has side arms. You can use this for support while you get dressed.  Do not have throw rugs and other things on the floor that can make you trip. What can I do in the kitchen?  Clean up any spills right away.  Avoid walking on wet floors.  Keep items that you use a lot in easy-to-reach places.  If you need to reach something above you, use a strong step stool that has a grab bar.  Keep electrical cords out of the way.  Do not use floor polish or wax that makes floors slippery. If you must use  wax, use non-skid floor wax.  Do not have throw rugs and other things on the floor that can make you trip. What can I do with my stairs?  Do not leave any items on the stairs.  Make sure that there are handrails on both sides of the stairs and use them. Fix handrails that are broken or loose. Make sure that handrails are as long as the stairways.  Check any carpeting to make sure that it is firmly attached to the stairs. Fix any carpet that is loose or worn.  Avoid having throw rugs at the top or bottom of the stairs. If you do have throw rugs, attach them to the floor with carpet tape.  Make sure that you have a light switch at the top of the stairs and the bottom of the stairs. If you do not have them, ask someone to add them for you. What else can I do to help prevent falls?  Wear shoes that:  Do not have high heels.  Have rubber bottoms.  Are comfortable and fit you well.  Are closed at the  toe. Do not wear sandals.  If you use a stepladder:  Make sure that it is fully opened. Do not climb a closed stepladder.  Make sure that both sides of the stepladder are locked into place.  Ask someone to hold it for you, if possible.  Clearly mark and make sure that you can see:  Any grab bars or handrails.  First and last steps.  Where the edge of each step is.  Use tools that help you move around (mobility aids) if they are needed. These include:  Canes.  Walkers.  Scooters.  Crutches.  Turn on the lights when you go into a dark area. Replace any light bulbs as soon as they burn out.  Set up your furniture so you have a clear path. Avoid moving your furniture around.  If any of your floors are uneven, fix them.  If there are any pets around you, be aware of where they are.  Review your medicines with your doctor. Some medicines can make you feel dizzy. This can increase your chance of falling. Ask your doctor what other things that you can do to help prevent falls. This information is not intended to replace advice given to you by your health care provider. Make sure you discuss any questions you have with your health care provider. Document Released: 10/13/2009 Document Revised: 05/24/2016 Document Reviewed: 01/21/2015 Elsevier Interactive Patient Education  2017 [employer].

## 2018-09-04 NOTE — Progress Notes (Signed)
Subjective:   Gregg Thomas is a 84 y.o. male who presents for Medicare Annual/Subsequent preventive examination.  Review of Systems:  Cardiac Risk Factors include: hypertension;male gender;advanced age (>95men, >39 women)     Objective:    Vitals: BP 120/62 (BP Location: Left Arm, Patient Position: Sitting)   Pulse 70   Temp 97.8 F (36.6 C) (Temporal)   Resp 16   Ht 5\' 6"  (1.676 m)   Wt 174 lb 3.2 oz (79 kg)   BMI 28.12 kg/m   Body mass index is 28.12 kg/m.  Advanced Directives 09/04/2018 06/06/2018 08/29/2017 05/28/2017 11/12/2016 09/12/2016 05/11/2016  Does Patient Have a Medical Advance Directive? Yes Yes Yes Yes Yes No Yes  Type of Paramedic of [place];Living will [place];Living will [place];Living will - - - [place];Living will  Does patient want to make changes to medical advance directive? - No - Patient declined - - - - No - Patient declined  Copy of [place] in Chart? No - copy requested Yes No - copy requested - - - No - copy requested  Would patient like information on creating a medical advance directive? - - - - - - No - patient declined information    Tobacco Social History   Tobacco Use  Smoking Status Never Smoker  Smokeless Tobacco Never Used     Counseling given: Not Answered   Clinical Intake:  Pre-visit preparation completed: Yes  Pain : No/denies pain     Nutritional Status: BMI 25 -29 Overweight Nutritional Risks: None Diabetes: No  How often do you need to have someone help you when you read instructions, pamphlets, or other written materials from your doctor or pharmacy?: 1 - Never What is the last grade level you completed in school?: 13 years   Interpreter Needed?: No  Information entered by :: Tiffany Hill,LPN   Past Medical History:  Diagnosis Date  . Chronic kidney disease   . Clotting disorder ([place])   . Heart  murmur   . Skin cancer    Past Surgical History:  Procedure Laterality Date  . EYE SURGERY Right    benign growth on right eye   . KNEE SURGERY Right   . MELANOMA EXCISION Bilateral    lower back    Family History  Problem Relation Age of Onset  . Stroke Mother   . Cerebral aneurysm Father    Social History   Socioeconomic History  . Marital status: Married    Spouse name: Not on file  . Number of children: Not on file  . Years of education: 13 years   . Highest education level: Some college, no degree  Occupational History  . Not on file  Social Needs  . Financial resource strain: Not hard at all  . Food insecurity:    Worry: Never true    Inability: Never true  . Transportation needs:    Medical: No    Non-medical: No  Tobacco Use  . Smoking status: Never Smoker  . Smokeless tobacco: Never Used  Substance and Sexual Activity  . Alcohol use: No  . Drug use: No  . Sexual activity: Not on file  Lifestyle  . Physical activity:    Days per week: 0 days    Minutes per session: 0 min  . Stress: Not at all  Relationships  . Social connections:    Talks on phone: More than three times a  week    Gets together: More than three times a week    Attends religious service: More than 4 times per year    Active member of club or organization: Yes    Attends meetings of clubs or organizations: More than 4 times per year    Relationship status: Married  Other Topics Concern  . Not on file  Social History Narrative   [place]     Outpatient Encounter Medications as of 09/04/2018  Medication Sig  . amLODipine (NORVASC) 10 MG tablet Take by mouth.  . Cyanocobalamin (RA VITAMIN B-12 TR) 1000 MCG TBCR Take by mouth.  . enalapril (VASOTEC) 20 MG tablet Take by mouth.  . Grape Seed Extract 50 MG CAPS Take 1,300 mg by mouth.   . warfarin (COUMADIN) 5 MG tablet Take 1 tablet (5 mg total) by mouth daily.   No facility-administered  encounter medications on file as of 09/04/2018.     Activities of Daily Living In your present state of health, do you have any difficulty performing the following activities: 09/04/2018  Hearing? N  Vision? N  Difficulty concentrating or making decisions? N  Walking or climbing stairs? N  Dressing or bathing? N  Doing errands, shopping? N  Preparing Food and eating ? N  Using the Toilet? N  In the past six months, have you accidently leaked urine? N  Do you have problems with loss of bowel control? N  Managing your Medications? N  Managing your Finances? N  Housekeeping or managing your Housekeeping? N  Some recent data might be hidden    Patient Care Team: Guadalupe Maple, MD as PCP - General (Family Medicine) Lequita Asal, MD as Referring Physician (Hematology and Oncology) Dagoberto Ligas, MD as Referring Physician (Internal Medicine) Merril Abbe, MD as Referring Physician (Dermatology)   Assessment:   This is a routine wellness examination for Gregg Thomas.  Exercise Activities and Dietary recommendations Current Exercise Habits: The patient does not participate in regular exercise at present(golf 2-3 times a month ), Exercise limited by: None identified  Goals    . Increase water intake     Recommend drinking at least 6-8 glasses of water a day        Fall Risk Fall Risk  09/04/2018 08/29/2017  Falls in the past year? No No   Is the patient's home free of loose throw rugs in walkways, pet beds, electrical cords, etc?   no      Grab bars in the bathroom? yes      Handrails on the stairs?   yes      Adequate lighting?   yes  Timed Get Up and Go Performed:Completed in 8 seconds with no use of assistive devices, steady gait. No intervention needed at this time.   Depression Screen PHQ 2/9 Scores 09/04/2018 08/29/2017  PHQ - 2 Score 0 0    Cognitive Function     6CIT Screen 09/04/2018 08/29/2017  What Year? 0 points 0 points  What month? 0 points 0 points  What  time? 0 points 0 points  Count back from 20 0 points 0 points  Months in reverse 0 points 0 points  Repeat phrase 0 points 0 points  Total Score 0 0    Immunization History  Administered Date(s) Administered  . Influenza, High Dose Seasonal PF 10/09/2016, 09/04/2018  . Influenza-Unspecified 10/24/2017  . Pneumococcal Polysaccharide-23 08/29/2017    Qualifies for Shingles Vaccine?  Yes, discussed shingrix vaccine   Screening Tests Health Maintenance  Topic Date Due  . TETANUS/TDAP  09/13/1950  . INFLUENZA VACCINE  07/31/2018  . PNA vac Low Risk Adult  Completed   Cancer Screenings: Lung: Low Dose CT Chest recommended if Age 31-80 years, 30 pack-year currently smoking OR have quit w/in 15years. Patient does not qualify. Colorectal: no longer required   Additional Screenings:  Hepatitis C Screening: not indicated       Plan:    I have personally reviewed and addressed the Medicare Annual Wellness questionnaire and have noted the following in the patient's chart:  A. Medical and social history B. Use of alcohol, tobacco or illicit drugs  C. Current medications and supplements D. Functional ability and status E.  Nutritional status F.  Physical activity G. Advance directives H. List of other physicians I.  Hospitalizations, surgeries, and ER visits in previous 12 months J.  [place] such as hearing and vision if needed, cognitive and depression L. Referrals and appointments   In addition, I have reviewed and discussed with patient certain preventive protocols, quality metrics, and best practice recommendations. A written personalized care plan for preventive services as well as general preventive health recommendations were provided to patient.   Signed,  Tyler Aas, LPN Nurse Health Advisor   Nurse Notes:none

## 2018-10-03 ENCOUNTER — Inpatient Hospital Stay: Payer: Medicare Other | Attending: Hematology and Oncology

## 2018-10-03 DIAGNOSIS — Z7901 Long term (current) use of anticoagulants: Secondary | ICD-10-CM | POA: Insufficient documentation

## 2018-10-03 LAB — PROTIME-INR
INR: 2.54
Prothrombin Time: 27.1 seconds — ABNORMAL HIGH (ref 11.4–15.2)

## 2018-10-15 ENCOUNTER — Other Ambulatory Visit: Payer: Self-pay | Admitting: Urgent Care

## 2018-10-16 ENCOUNTER — Telehealth: Payer: Self-pay | Admitting: *Deleted

## 2018-10-16 NOTE — Telephone Encounter (Signed)
-----   Message from [name], NP sent at 10/16/2018 10:14 AM EDT ----- Need to confirm warfarin dose. Changes were made while I was out. What is he taking? Please confirm and document. I will send refill once confirmed.   Gregg Thomas

## 2018-10-16 NOTE — Telephone Encounter (Signed)
Called patient and LVM that I need him to call back and leave me a message as to what dosage of Coumadin he is currently taking.  Once he has verified we will call pharmacy for refill.

## 2018-10-17 ENCOUNTER — Other Ambulatory Visit: Payer: Self-pay | Admitting: Urgent Care

## 2018-10-17 ENCOUNTER — Telehealth: Payer: Self-pay | Admitting: *Deleted

## 2018-10-17 MED ORDER — WARFARIN SODIUM 2.5 MG PO TABS: ORAL_TABLET | ORAL | 0 refills | Status: DC

## 2018-10-17 MED ORDER — WARFARIN SODIUM 5 MG PO TABS: ORAL_TABLET | ORAL | 2 refills | Status: DC

## 2018-10-17 NOTE — Telephone Encounter (Signed)
Patient called back to inform me he takes 5 mg Coumadin 6 days a week and 7.5 mg one day a week.

## 2018-10-22 ENCOUNTER — Encounter: Payer: Self-pay | Admitting: Family Medicine

## 2018-10-22 ENCOUNTER — Ambulatory Visit (INDEPENDENT_AMBULATORY_CARE_PROVIDER_SITE_OTHER): Payer: Medicare Other | Admitting: Family Medicine

## 2018-10-22 DIAGNOSIS — C44300 Unspecified malignant neoplasm of skin of unspecified part of face: Secondary | ICD-10-CM

## 2018-10-22 DIAGNOSIS — N184 Chronic kidney disease, stage 4 (severe): Secondary | ICD-10-CM | POA: Diagnosis not present

## 2018-10-22 DIAGNOSIS — I82501 Chronic embolism and thrombosis of unspecified deep veins of right lower extremity: Secondary | ICD-10-CM | POA: Diagnosis not present

## 2018-10-22 DIAGNOSIS — I2782 Chronic pulmonary embolism: Secondary | ICD-10-CM

## 2018-10-22 DIAGNOSIS — C44320 Squamous cell carcinoma of skin of unspecified parts of face: Secondary | ICD-10-CM | POA: Insufficient documentation

## 2018-10-22 NOTE — Assessment & Plan Note (Signed)
Doing well managed by hematology and stable on warfarin

## 2018-10-22 NOTE — Assessment & Plan Note (Signed)
Multiple lesion of face followed by derm

## 2018-10-22 NOTE — Assessment & Plan Note (Signed)
The current medical regimen is effective;  continue present plan and medications.  

## 2018-10-22 NOTE — Assessment & Plan Note (Signed)
Followed by nephrology. 

## 2018-10-22 NOTE — Progress Notes (Signed)
   BP (!) 104/59 (BP Location: Left Arm, Patient Position: Sitting, Cuff Size: Normal)   Pulse 70   Temp 97.9 F (36.6 C) (Oral)   Ht 5\' 7"  (1.702 m)   Wt 175 lb 3.2 oz (79.5 kg)   SpO2 97%   BMI 27.44 kg/m    Subjective:    Patient ID: Gregg Thomas, male    DOB: 1931-10-02, 84 y.o.   MRN: [id number]  HPI: Gregg Thomas is a 84 y.o. male  Chief Complaint  Patient presents with  . Follow-up  Patient followed by dermatology for multiple facial cancers.  These are stable has occasional surgery. Followed by hematology for warfarin management for DVTs and pulmonary embolus.  Patient wears compression socks.  On review wears his compression socks at night and reviewed his best to take him off at night. Blood pressure takes amlodipine 10 mg does have some leg swelling patient will discuss with nephrology about his leg swelling blood pressure medications will continue enalapril 20.  Relevant past medical, surgical, family and social history reviewed and updated as indicated. Interim medical history since our last visit reviewed. Allergies and medications reviewed and updated.  Review of Systems  Constitutional: Negative.   Respiratory: Negative.   Cardiovascular: Negative.     Per HPI unless specifically indicated above     Objective:    BP (!) 104/59 (BP Location: Left Arm, Patient Position: Sitting, Cuff Size: Normal)   Pulse 70   Temp 97.9 F (36.6 C) (Oral)   Ht 5\' 7"  (1.702 m)   Wt 175 lb 3.2 oz (79.5 kg)   SpO2 97%   BMI 27.44 kg/m   Wt Readings from Last 3 Encounters:  10/22/18 175 lb 3.2 oz (79.5 kg)  09/04/18 174 lb 3.2 oz (79 kg)  06/06/18 178 lb 11.2 oz (81.1 kg)    Physical Exam  Constitutional: He is oriented to person, place, and time. He appears well-developed and well-nourished.  HENT:  Head: Normocephalic and atraumatic.  Eyes: Conjunctivae and EOM are normal.  Neck: Normal range of motion.  Cardiovascular: Normal rate, regular  rhythm and normal heart sounds.  Pulmonary/Chest: Effort normal and breath sounds normal.  Musculoskeletal: Normal range of motion.  Neurological: He is alert and oriented to person, place, and time.  Skin: No erythema.  Psychiatric: He has a normal mood and affect. His behavior is normal. Judgment and thought content normal.    Results for orders placed or performed in visit on 10/03/18  Protime-INR  Result Value Ref Range   Prothrombin Time 27.1 (H) 11.4 - 15.2 seconds   INR 2.54       Assessment & Plan:   Problem List Items Addressed This Visit      Cardiovascular and Mediastinum   DVT (deep venous thrombosis) (HCC)    The current medical regimen is effective;  continue present plan and medications.       Pulmonary embolism ([place])    Doing well managed by hematology and stable on warfarin        Musculoskeletal and Integument   Cancer of skin of face    Multiple lesion of face followed by derm        Genitourinary   CKD (chronic kidney disease) stage 4, GFR 15-29 ml/min (HCC)    Followed by nephrology          Follow up plan: Return in about 3 months (around 01/22/2019) for Physical Exam.

## 2018-10-31 ENCOUNTER — Other Ambulatory Visit: Payer: Self-pay | Admitting: *Deleted

## 2018-10-31 DIAGNOSIS — I82501 Chronic embolism and thrombosis of unspecified deep veins of right lower extremity: Secondary | ICD-10-CM

## 2018-11-04 ENCOUNTER — Inpatient Hospital Stay: Payer: Medicare Other | Attending: Hematology and Oncology

## 2018-11-04 DIAGNOSIS — I82501 Chronic embolism and thrombosis of unspecified deep veins of right lower extremity: Secondary | ICD-10-CM

## 2018-11-04 DIAGNOSIS — Z86718 Personal history of other venous thrombosis and embolism: Secondary | ICD-10-CM | POA: Insufficient documentation

## 2018-11-04 LAB — PROTIME-INR
INR: 2.36
Prothrombin Time: 25.5 seconds — ABNORMAL HIGH (ref 11.4–15.2)

## 2018-11-05 ENCOUNTER — Other Ambulatory Visit: Payer: Self-pay | Admitting: *Deleted

## 2018-11-05 ENCOUNTER — Telehealth: Payer: Self-pay | Admitting: *Deleted

## 2018-11-05 DIAGNOSIS — I82501 Chronic embolism and thrombosis of unspecified deep veins of right lower extremity: Secondary | ICD-10-CM

## 2018-11-05 NOTE — Telephone Encounter (Signed)
-----   Message from Lequita Asal, MD sent at 11/04/2018  5:00 PM EST ----- Regarding: Please call patient with INR  INR good.  Continue current Coumadin.  Check in 1 month.  M  ----- Message ----- From: Buel Ream, Lab In [place] Sent: 11/04/2018   4:42 PM EST To: Lequita Asal, MD

## 2018-11-05 NOTE — Telephone Encounter (Signed)
Called patient to inform him that INR is good.  He can continue current dosage of coumadin.  Will recheck in one month.  Verbaliized understanding.

## 2018-11-06 ENCOUNTER — Telehealth: Payer: Self-pay | Admitting: *Deleted

## 2018-12-05 ENCOUNTER — Encounter: Payer: Self-pay | Admitting: Hematology and Oncology

## 2018-12-05 ENCOUNTER — Inpatient Hospital Stay: Payer: Medicare Other | Attending: Hematology and Oncology

## 2018-12-05 ENCOUNTER — Other Ambulatory Visit: Payer: Self-pay

## 2018-12-05 ENCOUNTER — Inpatient Hospital Stay: Payer: Medicare Other | Attending: Hematology and Oncology | Admitting: Hematology and Oncology

## 2018-12-05 VITALS — BP 134/79 | HR 66 | Temp 98.2°F | Resp 12 | Ht 67.0 in | Wt 177.6 lb

## 2018-12-05 DIAGNOSIS — Z7901 Long term (current) use of anticoagulants: Secondary | ICD-10-CM | POA: Insufficient documentation

## 2018-12-05 DIAGNOSIS — Z79899 Other long term (current) drug therapy: Secondary | ICD-10-CM | POA: Diagnosis not present

## 2018-12-05 DIAGNOSIS — Z86718 Personal history of other venous thrombosis and embolism: Secondary | ICD-10-CM | POA: Insufficient documentation

## 2018-12-05 DIAGNOSIS — I2782 Chronic pulmonary embolism: Secondary | ICD-10-CM

## 2018-12-05 DIAGNOSIS — I82501 Chronic embolism and thrombosis of unspecified deep veins of right lower extremity: Secondary | ICD-10-CM

## 2018-12-05 DIAGNOSIS — Z86711 Personal history of pulmonary embolism: Secondary | ICD-10-CM | POA: Diagnosis not present

## 2018-12-05 LAB — COMPREHENSIVE METABOLIC PANEL
ALT: 21 U/L (ref 0–44)
AST: 23 U/L (ref 15–41)
Albumin: 4.1 g/dL (ref 3.5–5.0)
Alkaline Phosphatase: 81 U/L (ref 38–126)
Anion gap: 7 (ref 5–15)
BUN: 25 mg/dL — ABNORMAL HIGH (ref 8–23)
CO2: 22 mmol/L (ref 22–32)
Calcium: 9.1 mg/dL (ref 8.9–10.3)
Chloride: 111 mmol/L (ref 98–111)
Creatinine, Ser: 1.91 mg/dL — ABNORMAL HIGH (ref 0.61–1.24)
GFR calc Af Amer: 36 mL/min — ABNORMAL LOW (ref >60)
GFR calc non Af Amer: 31 mL/min — ABNORMAL LOW (ref >60)
Glucose, Bld: 98 mg/dL (ref 70–99)
Potassium: 4.3 mmol/L (ref 3.5–5.1)
Sodium: 140 mmol/L (ref 135–145)
Total Bilirubin: 0.7 mg/dL (ref 0.3–1.2)
Total Protein: 7.2 g/dL (ref 6.5–8.1)

## 2018-12-05 LAB — CBC WITH DIFFERENTIAL/PLATELET
Abs Immature Granulocytes: 0.02 10*3/uL (ref 0.00–0.07)
Basophils Absolute: 0.1 10*3/uL (ref 0.0–0.1)
Basophils Relative: 1 %
Eosinophils Absolute: 0.1 10*3/uL (ref 0.0–0.5)
Eosinophils Relative: 2 %
HCT: 45.7 % (ref 39.0–52.0)
Hemoglobin: 15.3 g/dL (ref 13.0–17.0)
Immature Granulocytes: 0 %
Lymphocytes Relative: 29 %
Lymphs Abs: 1.9 10*3/uL (ref 0.7–4.0)
MCH: 32.2 pg (ref 26.0–34.0)
MCHC: 33.5 g/dL (ref 30.0–36.0)
MCV: 96.2 fL (ref 80.0–100.0)
Monocytes Absolute: 0.7 10*3/uL (ref 0.1–1.0)
Monocytes Relative: 11 %
Neutro Abs: 3.7 10*3/uL (ref 1.7–7.7)
Neutrophils Relative %: 57 %
Platelets: 208 10*3/uL (ref 150–400)
RBC: 4.75 MIL/uL (ref 4.22–5.81)
RDW: 13.6 % (ref 11.5–15.5)
WBC: 6.5 10*3/uL (ref 4.0–10.5)
nRBC: 0 % (ref 0.0–0.2)

## 2018-12-05 LAB — PROTIME-INR
INR: 2.31
Prothrombin Time: 25.1 seconds — ABNORMAL HIGH (ref 11.4–15.2)

## 2018-12-05 NOTE — Progress Notes (Signed)
[facility] day:  12/05/18  Chief Complaint: Gregg Thomas is a 84 y.o. male with a history of left lower extremity deep venous thrombosis (DVT) and pulmonary embolism who is seen for 6 month assessment.  HPI: The patient was last seen in the medical oncology clinic on 06/06/2018.  At that time, he denied any bruising or bleeding. Exam was unremarkable.  INR was 2.25.  He continued Coumadin 5 mg daily except for Fridays (7.5 mg). Total weekly Coumadin dose was 37.5 mg.  He has had monthly INR checks.  INR has ranged between 1.98 - 2.83.   During the interim, he has done well.  He denies any excess bruising or bleeding.    Past Medical History:  Diagnosis Date  . Chronic kidney disease   . Clotting disorder ([place])   . Heart murmur   . Skin cancer     Past Surgical History:  Procedure Laterality Date  . EYE SURGERY Right    benign growth on right eye   . KNEE SURGERY Right   . MELANOMA EXCISION Bilateral    lower back     Family History  Problem Relation Age of Onset  . Stroke Mother   . Cerebral aneurysm Father     Social History:  reports that he has never smoked. He has never used smokeless tobacco. He reports that he does not drink alcohol or use drugs.  He lives in [place]. He is accompanied by his wife today.  Allergies: No Known Allergies  Current Medications: Current Outpatient Medications  Medication Sig Dispense Refill  . amLODipine (NORVASC) 10 MG tablet Take by mouth.    . Cyanocobalamin (RA VITAMIN B-12 TR) 1000 MCG TBCR Take by mouth.    . enalapril (VASOTEC) 20 MG tablet Take by mouth.    . Grape Seed Extract 50 MG CAPS Take 1,300 mg by mouth.     . warfarin (COUMADIN) 2.5 MG tablet Take 1 tab (2.5 mg) 1 day a week, in addition to 5 mg tab for a total of 7.5 mg ONCE A WEEK. 30 tablet 0  . warfarin (COUMADIN) 5 MG tablet Take 1 tab (5 mg) 6 days a week.  Take 1 tab (5 mg) along with a 2.5 mg tab (total  7.5 mg) ONCE A WEEK. 30 tablet 2   No current facility-administered medications for this visit.     Review of Systems  Constitutional: Positive for weight loss (1 pound). Negative for chills, diaphoresis, fever and malaise/fatigue.       Feels "good".  HENT: Negative.  Negative for congestion, nosebleeds, sinus pain and sore throat.   Eyes: Negative.  Negative for double vision, pain, discharge and redness.  Respiratory: Negative.  Negative for cough, hemoptysis, sputum production and shortness of breath.   Cardiovascular: Positive for leg swelling (LEFT foot). Negative for chest pain, palpitations, orthopnea and PND.  Gastrointestinal: Negative.  Negative for abdominal pain, blood in stool, constipation, diarrhea, melena, nausea and vomiting.  Genitourinary: Negative.  Negative for dysuria, frequency, hematuria and urgency.  Musculoskeletal: Negative.  Negative for back pain, falls, joint pain, myalgias and neck pain.  Skin: Negative.  Negative for itching and rash.  Neurological: Positive for tingling (numbness - LEFT foot). Negative for dizziness, tremors, focal weakness, weakness and headaches.  Endo/Heme/Allergies: Negative.  Does not bruise/bleed easily.  Psychiatric/Behavioral: Negative.  Negative for depression, memory loss and suicidal ideas. The patient is not nervous/anxious and does not have insomnia.  All other systems reviewed and are negative.  Performance status (ECOG): 0  Physical Exam: Blood pressure 134/79, pulse 66, temperature 98.2 F (36.8 C), resp. rate 12, height 5\' 7"  (1.702 m), weight 177 lb 9.6 oz (80.6 kg). GENERAL:  Well developed, well nourished, gentleman sitting comfortably in the exam room in no acute distress. MENTAL STATUS:  Alert and oriented to person, place and time. HEAD:  Pearline Cables hair.  Normocephalic, atraumatic, face symmetric, no Cushingoid features. EYES:  Hazel eyes.  Pupils equal round and reactive to light and accomodation.  No conjunctivitis  or scleral icterus. ENT:  Oropharynx clear without lesion.  Tongue normal. Mucous membranes moist.  RESPIRATORY:  Clear to auscultation without rales, wheezes or rhonchi. CARDIOVASCULAR:  Regular rate and rhythm without murmur, rub or gallop. ABDOMEN:  Soft, non-tender, with active bowel sounds, and no hepatosplenomegaly.  No masses. SKIN:  s/p Moh's surgery right cheek.  No rashes, ulcers or lesions. EXTREMITIES: No edema, no skin discoloration or tenderness.  No palpable cords. LYMPH NODES: No palpable cervical, supraclavicular, axillary or inguinal adenopathy  NEUROLOGICAL: Unremarkable. PSYCH:  Appropriate.    Orders Only on 12/05/2018  Component Date Value Ref Range Status  . Sodium 12/05/2018 140  135 - 145 mmol/L Final  . Potassium 12/05/2018 4.3  3.5 - 5.1 mmol/L Final  . Chloride 12/05/2018 111  98 - 111 mmol/L Final  . CO2 12/05/2018 22  22 - 32 mmol/L Final  . Glucose, Bld 12/05/2018 98  70 - 99 mg/dL Final  . BUN 12/05/2018 25* 8 - 23 mg/dL Final  . Creatinine, Ser 12/05/2018 1.91* 0.61 - 1.24 mg/dL Final  . Calcium 12/05/2018 9.1  8.9 - 10.3 mg/dL Final  . Total Protein 12/05/2018 7.2  6.5 - 8.1 g/dL Final  . Albumin 12/05/2018 4.1  3.5 - 5.0 g/dL Final  . AST 12/05/2018 23  15 - 41 U/L Final  . ALT 12/05/2018 21  0 - 44 U/L Final  . Alkaline Phosphatase 12/05/2018 81  38 - 126 U/L Final  . Total Bilirubin 12/05/2018 0.7  0.3 - 1.2 mg/dL Final  . GFR calc non Af Amer 12/05/2018 31* >60 mL/min Final  . GFR calc Af Amer 12/05/2018 36* >60 mL/min Final  . Anion gap 12/05/2018 7  5 - 15 Final   Performed at [facility], [address] [place], [place] [address]  . WBC 12/05/2018 6.5  4.0 - 10.5 K/uL Final  . RBC 12/05/2018 4.75  4.22 - 5.81 MIL/uL Final  . Hemoglobin 12/05/2018 15.3  13.0 - 17.0 g/dL Final  . HCT 12/05/2018 45.7  39.0 - 52.0 % Final  . MCV 12/05/2018 96.2  80.0 - 100.0 fL Final  . MCH 12/05/2018 32.2  26.0 - 34.0 pg Final  . MCHC 12/05/2018  33.5  30.0 - 36.0 g/dL Final  . RDW 12/05/2018 13.6  11.5 - 15.5 % Final  . Platelets 12/05/2018 208  150 - 400 K/uL Final  . nRBC 12/05/2018 0.0  0.0 - 0.2 % Final  . Neutrophils Relative % 12/05/2018 57  % Final  . Neutro Abs 12/05/2018 3.7  1.7 - 7.7 K/uL Final  . Lymphocytes Relative 12/05/2018 29  % Final  . Lymphs Abs 12/05/2018 1.9  0.7 - 4.0 K/uL Final  . Monocytes Relative 12/05/2018 11  % Final  . Monocytes Absolute 12/05/2018 0.7  0.1 - 1.0 K/uL Final  . Eosinophils Relative 12/05/2018 2  % Final  . Eosinophils Absolute 12/05/2018 0.1  0.0 - 0.5 K/uL Final  . Basophils Relative 12/05/2018 1  % Final  . Basophils Absolute 12/05/2018 0.1  0.0 - 0.1 K/uL Final  . Immature Granulocytes 12/05/2018 0  % Final  . Abs Immature Granulocytes 12/05/2018 0.02  0.00 - 0.07 K/uL Final   Performed at [facility], [address] [place], [place] [address]  Appointment on 12/05/2018  Component Date Value Ref Range Status  . Prothrombin Time 12/05/2018 25.1* 11.4 - 15.2 seconds Final  . INR 12/05/2018 2.31   Final   Performed at [facility], [address] [place], [place] [address]    Assessment:  Hawken Bielby is a 84 y.o. male with a DVT and pulmonary embolism on 06/22/2010 following a long car ride. His lifetime risk of recurrent clot is felt to be 30%. Decision was made to hold Coumadin for procedures then restart Coumadin with a Lovenox bridge.  Hypercoagulable workup revealed the following negative studies: Factor V Leiden, prothrombin gene mutation, beta 2 glycoprotein, protein S free and and total, lupus anticoagulant panel, anti-cardiolipin antibodies. Protein C antigen was slightly low at 58%. Anti-ithrombin III was lower limits of normal at 73%. Additional negative studies included a CEA, PSA, SPEP and ANA.  He was hospitalized on 05/21/2014 with a thigh hematoma. His anticoagulation was reversed with FFP. He had surgery. Hemoglobin dropped to  10.8.  He is on Coumadin 5 mg daily except for Fridays (7.5 mg). Total weekly Coumadin dose is 37.5 mg.   He has a history of small pulmonary nodules on chest CT. Nodules have been stable per radiology (2011 to 2013). He has never smoked,  He had Mohs surgery of the right cheek for a basal cell carcinoma.  He underwent surgery for right ectropion.  Symptomatically, he is doing well.  He denies any excess bruising or bleeding. Exam is stable.  INR is 2.31. Creatinine is 1.91.  Plan: 1.  Labs today:  CBC with diff, CMP, PT/INR. 2.  DVT and pulmonary embolism:  Doing well.  Stable INR.  Continue to check INR monthly.  Continue Coumadin 5 mg daily except for Fridays (7.5 mg). Total weekly dose is 37.5 mg.  3.  RTC monthly for PT/INR. 4.  Discuss transition to [place].  Patient wishes to stay in [place].  Discuss follow-up with Dr. Rogue Bussing. 5.  RTC monthly x 5 for PT/INR. 6.  RTC in 6 months for MD (Dr Rogue Bussing) assessment and labs (CBC with diff, CMP, PT/INR).   Lequita Asal, MD  12/05/2018, 4:35 PM

## 2018-12-05 NOTE — Progress Notes (Signed)
See follow up. 

## 2019-01-05 ENCOUNTER — Inpatient Hospital Stay: Payer: Medicare Other | Attending: Hematology and Oncology

## 2019-01-05 DIAGNOSIS — I2782 Chronic pulmonary embolism: Secondary | ICD-10-CM

## 2019-01-05 DIAGNOSIS — Z86718 Personal history of other venous thrombosis and embolism: Secondary | ICD-10-CM | POA: Insufficient documentation

## 2019-01-05 DIAGNOSIS — Z86711 Personal history of pulmonary embolism: Secondary | ICD-10-CM | POA: Insufficient documentation

## 2019-01-05 DIAGNOSIS — I82501 Chronic embolism and thrombosis of unspecified deep veins of right lower extremity: Secondary | ICD-10-CM

## 2019-01-05 LAB — PROTIME-INR
INR: 2.46
Prothrombin Time: 26.3 seconds — ABNORMAL HIGH (ref 11.4–15.2)

## 2019-01-06 ENCOUNTER — Telehealth: Payer: Self-pay

## 2019-01-06 NOTE — Telephone Encounter (Signed)
called to inform the patient that his INR was good and to continue the current dose of Coumadin. No answer / left message on voice mail

## 2019-02-03 ENCOUNTER — Ambulatory Visit (INDEPENDENT_AMBULATORY_CARE_PROVIDER_SITE_OTHER): Payer: Medicare Other | Admitting: Family Medicine

## 2019-02-03 ENCOUNTER — Encounter: Payer: Self-pay | Admitting: Family Medicine

## 2019-02-03 VITALS — BP 127/66 | HR 78 | Temp 98.5°F | Ht 65.5 in | Wt 174.0 lb

## 2019-02-03 DIAGNOSIS — H9190 Unspecified hearing loss, unspecified ear: Secondary | ICD-10-CM | POA: Insufficient documentation

## 2019-02-03 DIAGNOSIS — I2782 Chronic pulmonary embolism: Secondary | ICD-10-CM

## 2019-02-03 DIAGNOSIS — I10 Essential (primary) hypertension: Secondary | ICD-10-CM

## 2019-02-03 DIAGNOSIS — I129 Hypertensive chronic kidney disease with stage 1 through stage 4 chronic kidney disease, or unspecified chronic kidney disease: Secondary | ICD-10-CM | POA: Insufficient documentation

## 2019-02-03 DIAGNOSIS — Z7189 Other specified counseling: Secondary | ICD-10-CM | POA: Insufficient documentation

## 2019-02-03 DIAGNOSIS — H9193 Unspecified hearing loss, bilateral: Secondary | ICD-10-CM | POA: Diagnosis not present

## 2019-02-03 DIAGNOSIS — N184 Chronic kidney disease, stage 4 (severe): Secondary | ICD-10-CM | POA: Diagnosis not present

## 2019-02-03 DIAGNOSIS — C44320 Squamous cell carcinoma of skin of unspecified parts of face: Secondary | ICD-10-CM

## 2019-02-03 LAB — MICROSCOPIC EXAMINATION
Bacteria, UA: NONE SEEN
Epithelial Cells (non renal): NONE SEEN /hpf (ref 0–10)
WBC, UA: NONE SEEN /hpf (ref 0–5)

## 2019-02-03 LAB — URINALYSIS, ROUTINE W REFLEX MICROSCOPIC
Bilirubin, UA: NEGATIVE
Glucose, UA: NEGATIVE
Ketones, UA: NEGATIVE
LEUKOCYTES UA: NEGATIVE
Nitrite, UA: NEGATIVE
Specific Gravity, UA: 1.02 (ref 1.005–1.030)
Urobilinogen, Ur: 0.2 mg/dL (ref 0.2–1.0)
pH, UA: 5 (ref 5.0–7.5)

## 2019-02-03 NOTE — Assessment & Plan Note (Signed)
Apt ENT  If no improvement in memory with hearing to neurology

## 2019-02-03 NOTE — Progress Notes (Signed)
BP 127/66   Pulse 78   Temp 98.5 F (36.9 C) (Oral)   Ht 5' 5.5" (1.664 m)   Wt 174 lb (78.9 kg)   SpO2 94%   BMI 28.51 kg/m    Subjective:    Patient ID: Gregg Thomas, male    DOB: 1931/07/29, 85 y.o.   MRN: [id number]  HPI: [name] is a 85 y.o. male  Chief Complaint  Patient presents with  . Annual Exam  Patient working with [facility] dermatology on multiple skin cancers on his face and arms. Patient accompanied by his wife who assists with history. Patient has some questions of memory issues.  Has no problems driving getting lost and really wonders if hearing is related to some of his memory issues.  Patient does have some hearing loss and has not had hearing checked for some time. No bleeding bruising issues with warfarin.  Checked by Dr. Mike Gip of hematology. Blood pressure doing well with medications. Also taking B12.  Relevant past medical, surgical, family and social history reviewed and updated as indicated. Interim medical history since our last visit reviewed. Allergies and medications reviewed and updated.  Review of Systems  Constitutional: Negative.   HENT: Negative.   Eyes: Negative.   Respiratory: Negative.   Cardiovascular: Negative.   Gastrointestinal: Negative.   Endocrine: Negative.   Genitourinary: Negative.   Musculoskeletal: Negative.   Skin: Negative.   Allergic/Immunologic: Negative.   Neurological: Negative.   Hematological: Negative.   Psychiatric/Behavioral: Negative.     Per HPI unless specifically indicated above     Objective:    BP 127/66   Pulse 78   Temp 98.5 F (36.9 C) (Oral)   Ht 5' 5.5" (1.664 m)   Wt 174 lb (78.9 kg)   SpO2 94%   BMI 28.51 kg/m   Wt Readings from Last 3 Encounters:  02/03/19 174 lb (78.9 kg)  12/05/18 177 lb 9.6 oz (80.6 kg)  10/22/18 175 lb 3.2 oz (79.5 kg)    Physical Exam Constitutional:      Appearance: He is well-developed.  HENT:     Head: Normocephalic.   Right Ear: External ear normal.     Left Ear: External ear normal.     Nose: Nose normal.  Eyes:     Conjunctiva/sclera: Conjunctivae normal.     Pupils: Pupils are equal, round, and reactive to light.  Neck:     Musculoskeletal: Normal range of motion and neck supple.     Thyroid: No thyromegaly.  Cardiovascular:     Rate and Rhythm: Normal rate and regular rhythm.     Heart sounds: Normal heart sounds.  Pulmonary:     Effort: Pulmonary effort is normal.     Breath sounds: Normal breath sounds.  Abdominal:     General: Bowel sounds are normal.     Palpations: Abdomen is soft. There is no hepatomegaly or splenomegaly.  Genitourinary:    Penis: Normal.   Musculoskeletal: Normal range of motion.  Lymphadenopathy:     Cervical: No cervical adenopathy.  Skin:    General: Skin is warm and dry.     Comments: Multiple skin cancers on face  Neurological:     Mental Status: He is alert and oriented to person, place, and time.     Deep Tendon Reflexes: Reflexes are normal and symmetric.  Psychiatric:        Mood and Affect: Mood normal.        Behavior: Behavior normal.  Thought Content: Thought content normal.        Judgment: Judgment normal.     Results for orders placed or performed in visit on 01/05/19  Protime-INR  Result Value Ref Range   Prothrombin Time 26.3 (H) 11.4 - 15.2 seconds   INR 2.46       Assessment & Plan:   Problem List Items Addressed This Visit      Cardiovascular and Mediastinum   Pulmonary embolism ([place])    On warfarin      Relevant Orders   CBC with Differential/Platelet   Comprehensive metabolic panel   Lipid panel   TSH   Urinalysis, Routine w reflex microscopic   Essential hypertension    The current medical regimen is effective;  continue present plan and medications.       Relevant Orders   CBC with Differential/Platelet   Comprehensive metabolic panel   Lipid panel   TSH   Urinalysis, Routine w reflex microscopic      Nervous and Auditory   Hearing loss - Primary    Apt ENT  If no improvement in memory with hearing to neurology      Relevant Orders   Ambulatory referral to ENT     Musculoskeletal and Integument   Squamous cell carcinoma, face    Mult cancers followed by [facility]        Genitourinary   CKD (chronic kidney disease) stage 4, GFR 15-29 ml/min (HCC)    The current medical regimen is effective;  continue present plan and medications.       Relevant Orders   CBC with Differential/Platelet   Comprehensive metabolic panel   Lipid panel   TSH   Urinalysis, Routine w reflex microscopic     Other   Advanced care planning/counseling discussion    A voluntary discussion about advanced care planning including explanation and discussion of advanced directives was extentively discussed with the patient.  Explained about the healthcare proxy and living will was reviewed and packet with forms with expiration of how to fill them out was given.  Time spent: Encounter 16+ min individuals present: Patient and wife          Follow up plan: No follow-ups on file.

## 2019-02-03 NOTE — Assessment & Plan Note (Signed)
The current medical regimen is effective;  continue present plan and medications.  

## 2019-02-03 NOTE — Assessment & Plan Note (Signed)
A voluntary discussion about advanced care planning including explanation and discussion of advanced directives was extentively discussed with the patient.  Explained about the healthcare proxy and living will was reviewed and packet with forms with expiration of how to fill them out was given.  Time spent: Encounter 16+ min individuals present: Patient and wife

## 2019-02-03 NOTE — Assessment & Plan Note (Signed)
Mult cancers followed by [facility]

## 2019-02-03 NOTE — Assessment & Plan Note (Signed)
On warfarin.

## 2019-02-04 ENCOUNTER — Encounter: Payer: Self-pay | Admitting: Family Medicine

## 2019-02-04 LAB — LIPID PANEL
Chol/HDL Ratio: 4.9 ratio (ref 0.0–5.0)
Cholesterol, Total: 161 mg/dL (ref 100–199)
HDL: 33 mg/dL — ABNORMAL LOW (ref >39)
LDL Calculated: 99 mg/dL (ref 0–99)
Triglycerides: 145 mg/dL (ref 0–149)
VLDL Cholesterol Cal: 29 mg/dL (ref 5–40)

## 2019-02-04 LAB — COMPREHENSIVE METABOLIC PANEL
ALT: 20 IU/L (ref 0–44)
AST: 22 IU/L (ref 0–40)
Albumin/Globulin Ratio: 1.5 (ref 1.2–2.2)
Albumin: 4.1 g/dL (ref 3.6–4.6)
Alkaline Phosphatase: 94 IU/L (ref 39–117)
BUN/Creatinine Ratio: 12 (ref 10–24)
BUN: 23 mg/dL (ref 8–27)
Bilirubin Total: 0.5 mg/dL (ref 0.0–1.2)
CO2: 20 mmol/L (ref 20–29)
Calcium: 9.9 mg/dL (ref 8.6–10.2)
Chloride: 108 mmol/L — ABNORMAL HIGH (ref 96–106)
Creatinine, Ser: 1.9 mg/dL — ABNORMAL HIGH (ref 0.76–1.27)
GFR calc Af Amer: 36 mL/min/{1.73_m2} — ABNORMAL LOW (ref >59)
GFR calc non Af Amer: 31 mL/min/{1.73_m2} — ABNORMAL LOW (ref >59)
Globulin, Total: 2.7 g/dL (ref 1.5–4.5)
Glucose: 89 mg/dL (ref 65–99)
Potassium: 4.4 mmol/L (ref 3.5–5.2)
Sodium: 143 mmol/L (ref 134–144)
TOTAL PROTEIN: 6.8 g/dL (ref 6.0–8.5)

## 2019-02-04 LAB — CBC WITH DIFFERENTIAL/PLATELET
Basophils Absolute: 0 10*3/uL (ref 0.0–0.2)
Basos: 1 %
EOS (ABSOLUTE): 0.2 10*3/uL (ref 0.0–0.4)
Eos: 4 %
Hematocrit: 46.6 % (ref 37.5–51.0)
Hemoglobin: 15.5 g/dL (ref 13.0–17.7)
IMMATURE GRANS (ABS): 0 10*3/uL (ref 0.0–0.1)
IMMATURE GRANULOCYTES: 0 %
LYMPHS: 25 %
Lymphocytes Absolute: 1.4 10*3/uL (ref 0.7–3.1)
MCH: 32.7 pg (ref 26.6–33.0)
MCHC: 33.3 g/dL (ref 31.5–35.7)
MCV: 98 fL — ABNORMAL HIGH (ref 79–97)
MONOCYTES: 13 %
Monocytes Absolute: 0.7 10*3/uL (ref 0.1–0.9)
Neutrophils Absolute: 3.1 10*3/uL (ref 1.4–7.0)
Neutrophils: 57 %
Platelets: 213 10*3/uL (ref 150–450)
RBC: 4.74 x10E6/uL (ref 4.14–5.80)
RDW: 13.3 % (ref 11.6–15.4)
WBC: 5.5 10*3/uL (ref 3.4–10.8)

## 2019-02-04 LAB — TSH: TSH: 3.67 u[IU]/mL (ref 0.450–4.500)

## 2019-02-05 ENCOUNTER — Inpatient Hospital Stay: Payer: Medicare Other | Attending: Hematology and Oncology

## 2019-02-05 ENCOUNTER — Telehealth: Payer: Self-pay

## 2019-02-05 DIAGNOSIS — Z86711 Personal history of pulmonary embolism: Secondary | ICD-10-CM | POA: Insufficient documentation

## 2019-02-05 DIAGNOSIS — I82501 Chronic embolism and thrombosis of unspecified deep veins of right lower extremity: Secondary | ICD-10-CM

## 2019-02-05 DIAGNOSIS — Z79899 Other long term (current) drug therapy: Secondary | ICD-10-CM | POA: Diagnosis not present

## 2019-02-05 DIAGNOSIS — I2782 Chronic pulmonary embolism: Secondary | ICD-10-CM

## 2019-02-05 DIAGNOSIS — Z7901 Long term (current) use of anticoagulants: Secondary | ICD-10-CM | POA: Diagnosis not present

## 2019-02-05 DIAGNOSIS — Z86718 Personal history of other venous thrombosis and embolism: Secondary | ICD-10-CM | POA: Diagnosis not present

## 2019-02-05 LAB — PROTIME-INR
INR: 2.22
Prothrombin Time: 24.3 seconds — ABNORMAL HIGH (ref 11.4–15.2)

## 2019-02-05 NOTE — Telephone Encounter (Signed)
Left detailed VM informing patient of INR results and per Dr. Mike Gip, patient is to continue current dose of Coumadin with no changes. Callback number provided on VM for any further questions.

## 2019-02-05 NOTE — Telephone Encounter (Signed)
-----   Message from Lequita Asal, MD sent at 02/05/2019  3:26 PM EST ----- Regarding: Please call patient  INR good.  Continue current dose of Coumadin.  M ----- Message ----- From: Buel Ream, Lab In [place] Sent: 02/05/2019   3:04 PM EST To: Lequita Asal, MD

## 2019-02-19 ENCOUNTER — Other Ambulatory Visit: Payer: Self-pay | Admitting: Urgent Care

## 2019-03-06 ENCOUNTER — Inpatient Hospital Stay: Payer: Medicare Other | Attending: Hematology and Oncology

## 2019-03-06 DIAGNOSIS — Z86718 Personal history of other venous thrombosis and embolism: Secondary | ICD-10-CM | POA: Diagnosis present

## 2019-03-06 DIAGNOSIS — Z86711 Personal history of pulmonary embolism: Secondary | ICD-10-CM | POA: Insufficient documentation

## 2019-03-06 DIAGNOSIS — I2782 Chronic pulmonary embolism: Secondary | ICD-10-CM

## 2019-03-06 DIAGNOSIS — I82501 Chronic embolism and thrombosis of unspecified deep veins of right lower extremity: Secondary | ICD-10-CM

## 2019-03-06 LAB — PROTIME-INR
INR: 2.3 — ABNORMAL HIGH (ref 0.8–1.2)
Prothrombin Time: 25.3 seconds — ABNORMAL HIGH (ref 11.4–15.2)

## 2019-03-09 ENCOUNTER — Telehealth: Payer: Self-pay

## 2019-03-09 NOTE — Telephone Encounter (Signed)
-----   Message from Lequita Asal, MD sent at 03/08/2019  4:21 AM EDT ----- Regarding: Please call patient  INR good.  Continue current dose of Coumadin.  M ----- Message ----- From: Buel Ream, Lab In [place] Sent: 03/06/2019  12:07 PM EDT To: Lequita Asal, MD

## 2019-03-09 NOTE — Telephone Encounter (Signed)
Left a message on the patient voice mail to inform him , INR results were good and continue current dose of Coumadin, if the patient has any question please feel free to contact the office and speak with Loma Sousa, [place]

## 2019-04-03 ENCOUNTER — Other Ambulatory Visit: Payer: Self-pay

## 2019-04-06 ENCOUNTER — Inpatient Hospital Stay: Payer: Medicare Other | Attending: Hematology and Oncology

## 2019-04-06 ENCOUNTER — Other Ambulatory Visit: Payer: Self-pay

## 2019-04-06 DIAGNOSIS — I82501 Chronic embolism and thrombosis of unspecified deep veins of right lower extremity: Secondary | ICD-10-CM | POA: Diagnosis not present

## 2019-04-06 DIAGNOSIS — I2782 Chronic pulmonary embolism: Secondary | ICD-10-CM

## 2019-04-06 LAB — PROTIME-INR
INR: 2.7 — ABNORMAL HIGH (ref 0.8–1.2)
Prothrombin Time: 27.9 seconds — ABNORMAL HIGH (ref 11.4–15.2)

## 2019-04-07 ENCOUNTER — Telehealth: Payer: Self-pay

## 2019-04-07 NOTE — Telephone Encounter (Signed)
Informed patient of INR levels. Patient verbalizes understanding and denies any further questions.

## 2019-04-07 NOTE — Telephone Encounter (Signed)
-----   Message from Lequita Asal, MD sent at 04/06/2019  3:49 PM EDT ----- Regarding: Please call patient.  INR looks good.  M ----- Message ----- From: Buel Ream, Lab In [place] Sent: 04/06/2019   3:42 PM EDT To: Lequita Asal, MD

## 2019-05-04 ENCOUNTER — Other Ambulatory Visit: Payer: Self-pay

## 2019-05-04 ENCOUNTER — Inpatient Hospital Stay: Payer: Medicare Other | Attending: Hematology and Oncology

## 2019-05-04 DIAGNOSIS — I2782 Chronic pulmonary embolism: Secondary | ICD-10-CM

## 2019-05-04 DIAGNOSIS — I82501 Chronic embolism and thrombosis of unspecified deep veins of right lower extremity: Secondary | ICD-10-CM

## 2019-05-04 DIAGNOSIS — Z86711 Personal history of pulmonary embolism: Secondary | ICD-10-CM | POA: Insufficient documentation

## 2019-05-04 DIAGNOSIS — Z86718 Personal history of other venous thrombosis and embolism: Secondary | ICD-10-CM | POA: Insufficient documentation

## 2019-05-04 LAB — PROTIME-INR
INR: 2.9 — ABNORMAL HIGH (ref 0.8–1.2)
Prothrombin Time: 29.8 seconds — ABNORMAL HIGH (ref 11.4–15.2)

## 2019-05-05 ENCOUNTER — Telehealth: Payer: Self-pay

## 2019-05-05 NOTE — Telephone Encounter (Signed)
-----   Message from Lequita Asal, MD sent at 05/05/2019 12:45 PM EDT ----- Regarding: Please call patient  INR is good.  Continue current dose of Coumadin.  Recheck INR as scheduled.  M ----- Message ----- From: Buel Ream, Lab In [place] Sent: 05/04/2019   2:55 PM EDT To: Lequita Asal, MD

## 2019-05-05 NOTE — Telephone Encounter (Signed)
Informed patient of INR results and to continue taking Coumadin as directed. Advised to continue with scheduled INR checks. Patient verbalizes understanding and denies any further questions .

## 2019-06-05 ENCOUNTER — Inpatient Hospital Stay: Payer: Medicare Other | Attending: Hematology and Oncology

## 2019-06-05 ENCOUNTER — Telehealth: Payer: Self-pay | Admitting: Hematology and Oncology

## 2019-06-05 ENCOUNTER — Other Ambulatory Visit: Payer: Self-pay

## 2019-06-05 DIAGNOSIS — Z85820 Personal history of malignant melanoma of skin: Secondary | ICD-10-CM | POA: Diagnosis not present

## 2019-06-05 DIAGNOSIS — Z7901 Long term (current) use of anticoagulants: Secondary | ICD-10-CM | POA: Insufficient documentation

## 2019-06-05 DIAGNOSIS — I82501 Chronic embolism and thrombosis of unspecified deep veins of right lower extremity: Secondary | ICD-10-CM

## 2019-06-05 DIAGNOSIS — Z86718 Personal history of other venous thrombosis and embolism: Secondary | ICD-10-CM | POA: Insufficient documentation

## 2019-06-05 DIAGNOSIS — Z79899 Other long term (current) drug therapy: Secondary | ICD-10-CM | POA: Insufficient documentation

## 2019-06-05 DIAGNOSIS — Z86711 Personal history of pulmonary embolism: Secondary | ICD-10-CM | POA: Insufficient documentation

## 2019-06-05 DIAGNOSIS — I2782 Chronic pulmonary embolism: Secondary | ICD-10-CM

## 2019-06-05 LAB — COMPREHENSIVE METABOLIC PANEL
ALT: 24 U/L (ref 0–44)
AST: 25 U/L (ref 15–41)
Albumin: 3.9 g/dL (ref 3.5–5.0)
Alkaline Phosphatase: 76 U/L (ref 38–126)
Anion gap: 6 (ref 5–15)
BUN: 33 mg/dL — ABNORMAL HIGH (ref 8–23)
CO2: 22 mmol/L (ref 22–32)
Calcium: 9.4 mg/dL (ref 8.9–10.3)
Chloride: 110 mmol/L (ref 98–111)
Creatinine, Ser: 2.21 mg/dL — ABNORMAL HIGH (ref 0.61–1.24)
GFR calc Af Amer: 30 mL/min — ABNORMAL LOW (ref >60)
GFR calc non Af Amer: 26 mL/min — ABNORMAL LOW (ref >60)
Glucose, Bld: 118 mg/dL — ABNORMAL HIGH (ref 70–99)
Potassium: 4.7 mmol/L (ref 3.5–5.1)
Sodium: 138 mmol/L (ref 135–145)
Total Bilirubin: 0.7 mg/dL (ref 0.3–1.2)
Total Protein: 7.1 g/dL (ref 6.5–8.1)

## 2019-06-05 LAB — PROTIME-INR
INR: 2.7 — ABNORMAL HIGH (ref 0.8–1.2)
Prothrombin Time: 28.2 seconds — ABNORMAL HIGH (ref 11.4–15.2)

## 2019-06-05 LAB — CBC WITH DIFFERENTIAL/PLATELET
Abs Immature Granulocytes: 0.02 10*3/uL (ref 0.00–0.07)
Basophils Absolute: 0 10*3/uL (ref 0.0–0.1)
Basophils Relative: 1 %
Eosinophils Absolute: 0.2 10*3/uL (ref 0.0–0.5)
Eosinophils Relative: 3 %
HCT: 42.6 % (ref 39.0–52.0)
Hemoglobin: 14.4 g/dL (ref 13.0–17.0)
Immature Granulocytes: 0 %
Lymphocytes Relative: 30 %
Lymphs Abs: 1.8 10*3/uL (ref 0.7–4.0)
MCH: 32.4 pg (ref 26.0–34.0)
MCHC: 33.8 g/dL (ref 30.0–36.0)
MCV: 95.9 fL (ref 80.0–100.0)
Monocytes Absolute: 0.6 10*3/uL (ref 0.1–1.0)
Monocytes Relative: 11 %
Neutro Abs: 3.4 10*3/uL (ref 1.7–7.7)
Neutrophils Relative %: 55 %
Platelets: 196 10*3/uL (ref 150–400)
RBC: 4.44 MIL/uL (ref 4.22–5.81)
RDW: 13.2 % (ref 11.5–15.5)
WBC: 6 10*3/uL (ref 4.0–10.5)
nRBC: 0 % (ref 0.0–0.2)

## 2019-06-06 ENCOUNTER — Telehealth: Payer: Self-pay | Admitting: Hematology and Oncology

## 2019-06-06 NOTE — Telephone Encounter (Signed)
Re:  INR  Called patient regarding INR of 2.7.  He is to continue his current dose of Coumadin.  INR check in 1 month.   Lequita Asal, MD

## 2019-06-07 NOTE — Progress Notes (Signed)
[facility]  [address], [address] [place], [place] [address] Phone: [phone]  Fax: [phone]   Telephone Visit:  06/08/2019  Referring physician: Guadalupe Maple, MD  I connected with [name] on 06/08/2019 at 3:08 PM by telephone and verified that I was speaking with the correct person using 2 identifiers.  The patient was at home.  I discussed the limitations, risk, security and privacy concerns of performing an evaluation and management service by telephone and the availability of in person appointments.  I also discussed with the patient that there may be a patient responsible charge related to this service.  The patient expressed understanding and agreed to proceed.   Chief Complaint: Gregg Thomas is a 85 y.o. male with a history of left lower extremity deep venous thrombosis (DVT) and pulmonary embolism who is seen for 6 month assessment.  HPI: The patient was last seen in the hematology clinic on 12/05/2018.  At that time, he was doing well.  He denied any excess bruising or bleeding. Exam was stable. INR was 2.31. Creatinine was 1.91.  He saw his PCP Golden Pop, MD, on 02/03/2019. He continued on warfarin and was referred to ENT for hearing loss.  He has had monthly INR checks.  INR has ranged between 2.22 - 2.90.   Labs on 06/05/2019: CBC all WNL. Creatinine 2.21, BUN 33.   During the interim, he is doing well. He was given hearing aids which have provided mild improvement. He denies any issues with bruising or bleeding. He denies any diarrhea. He denies any recent infections. Numbness and tinging in his left foot has improved.   His diet is stable. His weight is stable at about 170 lbs.   He currently is taking 5mg  Coumadin a day, with one day 7.5 mg.   He is followed in nephrology by Dr. Holley Raring. He was seen last week and called back for repeat labs today.  He would prefer to be seen in [place].    Past  Medical History:  Diagnosis Date  . Chronic kidney disease   . Clotting disorder ([place])   . Heart murmur   . Skin cancer     Past Surgical History:  Procedure Laterality Date  . EYE SURGERY Right    benign growth on right eye   . KNEE SURGERY Right   . MELANOMA EXCISION Bilateral    lower back     Family History  Problem Relation Age of Onset  . Stroke Mother   . Cerebral aneurysm Father     Social History:  reports that he has never smoked. He has never used smokeless tobacco. He reports that he does not drink alcohol or use drugs. He lives in [place]. He is accompanied at home by his wife today.  Participants in the patient's visit and their role in the encounter included the patient, his wife, and Waymon Budge, RN or [employer], [place], today.  The intake visit was provided by Waymon Budge, RN.  Allergies: No Known Allergies  Current Medications: Current Outpatient Medications  Medication Sig Dispense Refill  . amLODipine (NORVASC) 10 MG tablet Take 10 mg by mouth daily.     Marland Kitchen Apoaequorin (PREVAGEN PO) Take 1 tablet by mouth daily.    . Cyanocobalamin (RA VITAMIN B-12 TR) 1000 MCG TBCR Take 1 tablet by mouth daily.     . enalapril (VASOTEC) 20 MG tablet Take 20 mg by mouth daily.     . Grape  Seed Extract 50 MG CAPS Take 1,300 mg by mouth daily.     Marland Kitchen warfarin (COUMADIN) 2.5 MG tablet Take 1 tab (2.5 mg) 1 day a week, in addition to 5 mg tab for a total of 7.5 mg ONCE A WEEK. 30 tablet 0  . warfarin (COUMADIN) 5 MG tablet Take 1 tab (5 mg) 6 days a week. Take 1 tab (5 mg) along with a 2.5 mg tab (total 7.5 mg) ONCE A WEEK. 30 tablet 0   No current facility-administered medications for this visit.     Review of Systems  Constitutional: Positive for weight loss (1 pound). Negative for chills, diaphoresis, fever and malaise/fatigue.       Feels "good".  HENT: Positive for hearing loss (improved with hearing aides). Negative for congestion, ear pain,  nosebleeds, sinus pain and sore throat.   Eyes: Negative.  Negative for blurred vision, double vision, pain, discharge and redness.  Respiratory: Negative.  Negative for cough, hemoptysis, sputum production and shortness of breath.   Cardiovascular: Positive for leg swelling (LEFT foot). Negative for chest pain, palpitations, orthopnea and PND.  Gastrointestinal: Negative.  Negative for abdominal pain, blood in stool, constipation, diarrhea, melena, nausea and vomiting.  Genitourinary: Negative.  Negative for dysuria, frequency, hematuria and urgency.  Musculoskeletal: Negative.  Negative for back pain, falls, joint pain, myalgias and neck pain.  Skin: Negative.  Negative for itching and rash.  Neurological: Positive for sensory change (numbness in his left foot). Negative for dizziness, tingling, tremors, focal weakness, weakness and headaches.       Less numbness and swelling in left foot.  Endo/Heme/Allergies: Negative.  Does not bruise/bleed easily.  Psychiatric/Behavioral: Negative.  Negative for depression, memory loss and suicidal ideas. The patient is not nervous/anxious and does not have insomnia.   All other systems reviewed and are negative.   Performance status (ECOG): 1   No visits with results within 3 Day(s) from this visit.  Latest known visit with results is:  Appointment on 06/05/2019  Component Date Value Ref Range Status  . Prothrombin Time 06/05/2019 28.2* 11.4 - 15.2 seconds Final  . INR 06/05/2019 2.7* 0.8 - 1.2 Final   Comment: (NOTE) INR goal varies based on device and disease states. Performed at [facility], [address] [place], [place] [address]   . Sodium 06/05/2019 138  135 - 145 mmol/L Final  . Potassium 06/05/2019 4.7  3.5 - 5.1 mmol/L Final  . Chloride 06/05/2019 110  98 - 111 mmol/L Final  . CO2 06/05/2019 22  22 - 32 mmol/L Final  . Glucose, Bld 06/05/2019 118* 70 - 99 mg/dL Final  . BUN 06/05/2019 33* 8 - 23 mg/dL Final  .  Creatinine, Ser 06/05/2019 2.21* 0.61 - 1.24 mg/dL Final  . Calcium 06/05/2019 9.4  8.9 - 10.3 mg/dL Final  . Total Protein 06/05/2019 7.1  6.5 - 8.1 g/dL Final  . Albumin 06/05/2019 3.9  3.5 - 5.0 g/dL Final  . AST 06/05/2019 25  15 - 41 U/L Final  . ALT 06/05/2019 24  0 - 44 U/L Final  . Alkaline Phosphatase 06/05/2019 76  38 - 126 U/L Final  . Total Bilirubin 06/05/2019 0.7  0.3 - 1.2 mg/dL Final  . GFR calc non Af Amer 06/05/2019 26* >60 mL/min Final  . GFR calc Af Amer 06/05/2019 30* >60 mL/min Final  . Anion gap 06/05/2019 6  5 - 15 Final   Performed at [facility], [place],  [place], [place] [address]  . WBC 06/05/2019 6.0  4.0 - 10.5 K/uL Final  . RBC 06/05/2019 4.44  4.22 - 5.81 MIL/uL Final  . Hemoglobin 06/05/2019 14.4  13.0 - 17.0 g/dL Final  . HCT 06/05/2019 42.6  39.0 - 52.0 % Final  . MCV 06/05/2019 95.9  80.0 - 100.0 fL Final  . MCH 06/05/2019 32.4  26.0 - 34.0 pg Final  . MCHC 06/05/2019 33.8  30.0 - 36.0 g/dL Final  . RDW 06/05/2019 13.2  11.5 - 15.5 % Final  . Platelets 06/05/2019 196  150 - 400 K/uL Final  . nRBC 06/05/2019 0.0  0.0 - 0.2 % Final  . Neutrophils Relative % 06/05/2019 55  % Final  . Neutro Abs 06/05/2019 3.4  1.7 - 7.7 K/uL Final  . Lymphocytes Relative 06/05/2019 30  % Final  . Lymphs Abs 06/05/2019 1.8  0.7 - 4.0 K/uL Final  . Monocytes Relative 06/05/2019 11  % Final  . Monocytes Absolute 06/05/2019 0.6  0.1 - 1.0 K/uL Final  . Eosinophils Relative 06/05/2019 3  % Final  . Eosinophils Absolute 06/05/2019 0.2  0.0 - 0.5 K/uL Final  . Basophils Relative 06/05/2019 1  % Final  . Basophils Absolute 06/05/2019 0.0  0.0 - 0.1 K/uL Final  . Immature Granulocytes 06/05/2019 0  % Final  . Abs Immature Granulocytes 06/05/2019 0.02  0.00 - 0.07 K/uL Final   Performed at [facility], [address] [place], [place] [address]    Assessment:  Treyvon Blahut is a 85 y.o. male with a DVT and pulmonary embolism on 06/22/2010  following a long car ride. His lifetime risk of recurrent clot is felt to be 30%. Decision was made to hold Coumadin for procedures then restart Coumadin with a Lovenox bridge.  Hypercoagulable workup revealed the following negative studies: Factor V Leiden, prothrombin gene mutation, beta 2 glycoprotein, protein S free and and total, lupus anticoagulant panel, anti-cardiolipin antibodies. Protein C antigen was slightly low at 58%. Anti-ithrombin III was lower limits of normal at 73%. Additional negative studies included a CEA, PSA, SPEP and ANA.  He was hospitalized on 05/21/2014 with a thigh hematoma. His anticoagulation was reversed with FFP. He had surgery. Hemoglobin dropped to 10.8.  He is on Coumadin 5 mg daily except for Fridays (7.5 mg). Total weekly Coumadin dose is 37.5 mg.   He has a history of small pulmonary nodules on chest CT. Nodules have been stable per radiology (2011 to 2013). He has never smoked,  He had Mohs surgery of the right cheek for a basal cell carcinoma.  He underwent surgery for right ectropion.  Symptomatically, he is doing well.  He denies any bruising or bleeding.  INR is 2.7.  Plan: 1.   Review labs from 06/05/2019. 2.   DVT and pulmonary embolism             Clinically doing well.             INR is stable.  Continue monthly INR checks.             Continue Coumadin 5 mg daily except for Fridays (7.5 mg). Total weekly dose is 37.5 mg.  3.   RTC monthly x 5 for PT/INR. 4.   Discuss transition to [place].  Patient wishes to stay in [place].  Discuss follow-up with Dr. Rogue Bussing. 5.   RTC in 6 months for MD (Dr Rogue Bussing) assessment and labs (CBC with diff, CMP, PT/INR).  I  discussed the assessment and treatment plan with the patient.  The patient was provided an opportunity to ask questions and all were answered.  The patient agreed with the plan and demonstrated an understanding of the instructions.  The patient was advised to call back or  seek an in person evaluation if the symptoms worsen or if the condition fails to improve as anticipated.  I provided 11 minutes (3:08 PM - 3:19 PM) of non face-to-face telephone visit time during this this encounter and > 50% was spent counseling as documented under my assessment and plan.  I provided these services from the [facility] office.   Nolon Stalls, MD, PhD  06/08/2019, 3:08 PM  I, Molly Dorshimer, am acting as Education administrator for [employer]. Mike Gip, MD, PhD.  I, Melissa C. Mike Gip, MD, have reviewed the above documentation for accuracy and completeness, and I agree with the above.

## 2019-06-08 ENCOUNTER — Encounter: Payer: Self-pay | Admitting: Hematology and Oncology

## 2019-06-08 ENCOUNTER — Ambulatory Visit: Payer: Medicare Other | Admitting: Internal Medicine

## 2019-06-08 ENCOUNTER — Other Ambulatory Visit: Payer: Medicare Other

## 2019-06-08 ENCOUNTER — Inpatient Hospital Stay (HOSPITAL_BASED_OUTPATIENT_CLINIC_OR_DEPARTMENT_OTHER): Payer: Medicare Other | Admitting: Hematology and Oncology

## 2019-06-08 DIAGNOSIS — Z7901 Long term (current) use of anticoagulants: Secondary | ICD-10-CM

## 2019-06-08 DIAGNOSIS — I2782 Chronic pulmonary embolism: Secondary | ICD-10-CM

## 2019-06-08 DIAGNOSIS — I82501 Chronic embolism and thrombosis of unspecified deep veins of right lower extremity: Secondary | ICD-10-CM

## 2019-06-08 NOTE — Progress Notes (Signed)
Confirmed Name, DOB, and Address. Denies any concerns.  

## 2019-07-06 ENCOUNTER — Inpatient Hospital Stay: Payer: Medicare Other | Attending: Internal Medicine

## 2019-07-06 ENCOUNTER — Other Ambulatory Visit: Payer: Self-pay

## 2019-07-06 ENCOUNTER — Inpatient Hospital Stay: Payer: Medicare Other

## 2019-07-06 DIAGNOSIS — Z86711 Personal history of pulmonary embolism: Secondary | ICD-10-CM | POA: Insufficient documentation

## 2019-07-06 DIAGNOSIS — Z86718 Personal history of other venous thrombosis and embolism: Secondary | ICD-10-CM | POA: Insufficient documentation

## 2019-07-06 DIAGNOSIS — I82501 Chronic embolism and thrombosis of unspecified deep veins of right lower extremity: Secondary | ICD-10-CM

## 2019-07-06 DIAGNOSIS — I2782 Chronic pulmonary embolism: Secondary | ICD-10-CM

## 2019-07-06 LAB — PROTIME-INR
INR: 3 — ABNORMAL HIGH (ref 0.8–1.2)
Prothrombin Time: 30.9 seconds — ABNORMAL HIGH (ref 11.4–15.2)

## 2019-07-07 ENCOUNTER — Telehealth: Payer: Self-pay

## 2019-07-07 NOTE — Telephone Encounter (Signed)
Attempted to contact patient regarding INR results. Left VM requesting callback.

## 2019-07-07 NOTE — Telephone Encounter (Signed)
-----   Message from Lequita Asal, MD sent at 07/07/2019  6:00 AM EDT ----- Regarding: Please call patient  INR is 3.0.  Goal 2-3.  Any changes in diet, medications, diarrhea?  Patient has INR checks monthly.  Consider rechecking if any excess bruising or bleeding.  M ----- Message ----- From: Buel Ream, Lab In [place] Sent: 07/06/2019   4:21 PM EDT To: Lequita Asal, MD

## 2019-07-07 NOTE — Telephone Encounter (Signed)
-----   Message from Lequita Asal, MD sent at 07/07/2019  6:00 AM EDT ----- Regarding: Please call patient  INR is 3.0.  Goal 2-3.  Any changes in diet, medications, diarrhea?  Patient has INR checks monthly.  Consider rechecking if any excess bruising or bleeding.  M ----- Message ----- From: Buel Ream, Lab In [place] Sent: 07/06/2019   4:21 PM EDT To: Lequita Asal, MD

## 2019-07-07 NOTE — Telephone Encounter (Signed)
Incoming call from patient returning call. Informed him of INR results. Patient denies any changes in diet, medications, diarrhea, etc. Advised patient to continue with monthly INR checks and to contact us should he notice any excessive bruising or bleeding. Patient verbalizes understanding and denies any further questions or concerns.

## 2019-08-03 ENCOUNTER — Other Ambulatory Visit: Payer: Self-pay

## 2019-08-03 ENCOUNTER — Inpatient Hospital Stay: Payer: Medicare Other

## 2019-08-03 ENCOUNTER — Inpatient Hospital Stay: Payer: Medicare Other | Attending: Internal Medicine

## 2019-08-03 DIAGNOSIS — Z820 Family history of epilepsy and other diseases of the nervous system: Secondary | ICD-10-CM | POA: Diagnosis not present

## 2019-08-03 DIAGNOSIS — Z86718 Personal history of other venous thrombosis and embolism: Secondary | ICD-10-CM | POA: Diagnosis present

## 2019-08-03 DIAGNOSIS — Z823 Family history of stroke: Secondary | ICD-10-CM | POA: Insufficient documentation

## 2019-08-03 DIAGNOSIS — I2782 Chronic pulmonary embolism: Secondary | ICD-10-CM

## 2019-08-03 DIAGNOSIS — Z7901 Long term (current) use of anticoagulants: Secondary | ICD-10-CM | POA: Diagnosis present

## 2019-08-03 DIAGNOSIS — Z85828 Personal history of other malignant neoplasm of skin: Secondary | ICD-10-CM | POA: Insufficient documentation

## 2019-08-03 DIAGNOSIS — I82501 Chronic embolism and thrombosis of unspecified deep veins of right lower extremity: Secondary | ICD-10-CM

## 2019-08-03 DIAGNOSIS — Z86711 Personal history of pulmonary embolism: Secondary | ICD-10-CM | POA: Insufficient documentation

## 2019-08-03 DIAGNOSIS — N184 Chronic kidney disease, stage 4 (severe): Secondary | ICD-10-CM | POA: Diagnosis not present

## 2019-08-03 DIAGNOSIS — R200 Anesthesia of skin: Secondary | ICD-10-CM | POA: Insufficient documentation

## 2019-08-03 DIAGNOSIS — R208 Other disturbances of skin sensation: Secondary | ICD-10-CM | POA: Diagnosis not present

## 2019-08-03 DIAGNOSIS — H9193 Unspecified hearing loss, bilateral: Secondary | ICD-10-CM | POA: Diagnosis not present

## 2019-08-03 DIAGNOSIS — Z79899 Other long term (current) drug therapy: Secondary | ICD-10-CM | POA: Insufficient documentation

## 2019-08-03 LAB — PROTIME-INR
INR: 2.9 — ABNORMAL HIGH (ref 0.8–1.2)
Prothrombin Time: 30 seconds — ABNORMAL HIGH (ref 11.4–15.2)

## 2019-08-04 ENCOUNTER — Telehealth: Payer: Self-pay

## 2019-08-04 NOTE — Telephone Encounter (Signed)
-----   Message from Lequita Asal, MD sent at 08/04/2019  5:31 AM EDT ----- Regarding: please call patient with inr  INR stable at 2.9.  Continue monthly checks.  M ----- Message ----- From: Buel Ream, Lab In [place] Sent: 08/03/2019   4:37 PM EDT To: Lequita Asal, MD

## 2019-08-04 NOTE — Telephone Encounter (Signed)
Spoke with the patient to inform him that his INR was stable at 2.9 per Dr Mike Gip.  I have also informed him that he will need a repeat lab in 1 month. The patient was understanding and agreeable.

## 2019-08-26 HISTORY — PX: OTHER SURGICAL HISTORY: SHX169

## 2019-08-31 ENCOUNTER — Inpatient Hospital Stay: Payer: Medicare Other

## 2019-08-31 ENCOUNTER — Other Ambulatory Visit: Payer: Self-pay

## 2019-08-31 ENCOUNTER — Telehealth: Payer: Self-pay

## 2019-08-31 DIAGNOSIS — I82501 Chronic embolism and thrombosis of unspecified deep veins of right lower extremity: Secondary | ICD-10-CM

## 2019-08-31 DIAGNOSIS — Z86718 Personal history of other venous thrombosis and embolism: Secondary | ICD-10-CM | POA: Diagnosis not present

## 2019-08-31 LAB — PROTIME-INR
INR: 3 — ABNORMAL HIGH (ref 0.8–1.2)
Prothrombin Time: 30.9 seconds — ABNORMAL HIGH (ref 11.4–15.2)

## 2019-08-31 NOTE — Telephone Encounter (Signed)
-----   Message from Lequita Asal, MD sent at 08/31/2019  4:59 PM EDT ----- Regarding: Please call patient   Any increased bruising or bleeding?  New medication or herbal product?  Diarrhea?  INR is 3.0.  Goal INR 2-3.  Check INR in 1 month.  M ----- Message ----- From: Buel Ream, Lab In [place] Sent: 08/31/2019   4:45 PM EDT To: Lequita Asal, MD

## 2019-08-31 NOTE — Telephone Encounter (Signed)
Spoke with the patient to inform him that his INR was 3.0 today and his goal is 2-3. And Dr Mike Gip would like a recheck in 1 month. The patient was understanding and agreeable.

## 2019-09-04 ENCOUNTER — Other Ambulatory Visit: Payer: Self-pay | Admitting: *Deleted

## 2019-09-04 MED ORDER — WARFARIN SODIUM 5 MG PO TABS: ORAL_TABLET | ORAL | 1 refills | Status: DC

## 2019-09-08 ENCOUNTER — Telehealth: Payer: Self-pay

## 2019-09-08 NOTE — Telephone Encounter (Signed)
Refill Warfarin 5 mg 1 tab po 6 days a week take 1 5 mg with 2.5mg  ( 7.5) 1 day a week with # 30 with 1 refill. The patient has been made aware.

## 2019-09-09 ENCOUNTER — Ambulatory Visit (INDEPENDENT_AMBULATORY_CARE_PROVIDER_SITE_OTHER): Payer: Medicare Other

## 2019-09-09 VITALS — BP 109/63 | HR 71 | Ht 67.0 in | Wt 175.0 lb

## 2019-09-09 DIAGNOSIS — Z0000 Encounter for general adult medical examination without abnormal findings: Secondary | ICD-10-CM

## 2019-09-09 NOTE — Progress Notes (Signed)
Subjective:   Gregg Thomas is a 85 y.o. male who presents for Medicare Annual/Subsequent preventive examination.  This visit is being conducted via phone call  - after an attmept to do on video chat - due to the COVID-19 pandemic. This patient has given me verbal consent via phone to conduct this visit, patient states they are participating from their home address. Some vital signs may be absent or patient reported.   Patient identification: identified by name, DOB, and current address.    Review of Systems:  Cardiac Risk Factors include: advanced age (>14mn, >>58women);male gender;hypertension     Objective:    Vitals: BP 109/63 Comment: pt reported  Pulse 71 Comment: pt reported  Ht _0  (1.702 m) Comment: pt reported  Wt 175 lb (79.4 kg) Comment: pt reported  BMI 27.41 kg/m   Body mass index is 27.41 kg/m.  Advanced Directives 09/09/2019 06/08/2019 12/05/2018 09/04/2018 06/06/2018 08/29/2017 05/28/2017  Does Patient Have a Medical Advance Directive? _1  Yes Yes  Type of Advance Directive Living will;Healthcare Power of [place]Living will Living will;Healthcare Power of [place] Wynon[place] will [place] will [place]Living will -  Does patient want to make changes to medical advance directive? - - No - Patient declined - No - Patient declined - -  Copy of [place]in Chart? No - copy requested No - copy requested No - copy requested No - copy requested Yes No - copy requested -  Would patient like information on creating a medical advance directive? - - - - - - -    Tobacco Social History   Tobacco Use  Smoking Status Never Smoker  Smokeless Tobacco Never Used     Counseling given: Not Answered   Clinical Intake:  Pre-visit preparation completed: Yes  Pain : No/denies pain Pain Score: 0-No pain     Nutritional Status: BMI  25 -29 Overweight Nutritional Risks: None Diabetes: No  How often do you need to have someone help you when you read instructions, pamphlets, or other written materials from your doctor or pharmacy?: 1 - Never  Interpreter Needed?: No  Information entered by :: Tiffany Hill,LPN  Past Medical History:  Diagnosis Date  . Chronic kidney disease   . Clotting disorder ([place]   . Heart murmur   . Skin cancer    Past Surgical History:  Procedure Laterality Date  . biopsy Right 08/26/2019   temple biopsy - dr.dasher   . EYE SURGERY Right    benign growth on right eye   . KNEE SURGERY Right   . MELANOMA EXCISION Bilateral    lower back    Family History  Problem Relation Age of Onset  . Stroke Mother   . Cerebral aneurysm Father    Social History   Socioeconomic History  . Marital status: Married    Spouse name: Not on file  . Number of children: Not on file  . Years of education: 13 years   . Highest education level: Some college, no degree  Occupational History  . Occupation: retired  SScientific laboratory technician . Financial resource strain: Not hard at all  . Food insecurity    Worry: Never true    Inability: Never true  . Transportation needs    Medical: No    Non-medical: No  Tobacco Use  . Smoking status: Never Smoker  . Smokeless tobacco: Never Used  Substance and Sexual Activity  . Alcohol use: No  . Drug use: No  . Sexual activity: Not on file  Lifestyle  . Physical activity    Days per week: 3 days    Minutes per session: 30 min  . Stress: Not at all  Relationships  . Social connections    Talks on phone: More than three times a week    Gets together: More than three times a week    Attends religious service: More than 4 times per year    Active member of club or organization: Yes    Attends meetings of clubs or organizations: More than 4 times per year    Relationship status: Married  Other Topics Concern  . Not on file  Social History Narrative   [place] 2 times a week, alk 3 times a week     Outpatient Encounter Medications as of 09/09/2019  Medication Sig  . amLODipine (NORVASC) 10 MG tablet Take 10 mg by mouth daily.   Marland Kitchen Apoaequorin (PREVAGEN PO) Take 1 tablet by mouth daily.  . Cyanocobalamin (RA VITAMIN B-12 TR) 1000 MCG TBCR Take 1 tablet by mouth daily. 3-4 times a week  . enalapril (VASOTEC) 20 MG tablet Take 20 mg by mouth daily.   . Grape Seed Extract 50 MG CAPS Take 1,300 mg by mouth daily.   Marland Kitchen warfarin (COUMADIN) 2.5 MG tablet Take 1 tab (2.5 mg) 1 day a week, in addition to 5 mg tab for a total of 7.5 mg ONCE A WEEK.  . warfarin (COUMADIN) 5 MG tablet Take one 5 mg tablet 6 days a week and take one 5 mg tablet with a 2.5 mg tablet (7.5 mg) one day a week   No facility-administered encounter medications on file as of 09/09/2019.     Activities of Daily Living In your present state of health, do you have any difficulty performing the following activities: 09/09/2019 02/03/2019  Hearing? Y Y  Comment hearing aids -  Vision? N N  Comment glasses, no eye dr -  Difficulty concentrating or making decisions? N Y  Walking or climbing stairs? N N  Dressing or bathing? N N  Doing errands, shopping? N N  Preparing Food and eating ? N -  Using the Toilet? N -  In the past six months, have you accidently leaked urine? N -  Do you have problems with loss of bowel control? N -  Managing your Medications? N -  Managing your Finances? N -  Housekeeping or managing your Housekeeping? N -  Some recent data might be hidden    Patient Care Team: Guadalupe Maple, MD as PCP - General (Family Medicine) Lequita Asal, MD as Referring Physician (Hematology and Oncology) Dagoberto Ligas, MD as Referring Physician (Internal Medicine) Merril Abbe, MD as Referring Physician (Dermatology)   Assessment:   This is a routine wellness examination for Gregg Thomas.  Exercise Activities and  Dietary recommendations Current Exercise Habits: Home exercise routine, Type of exercise: walking, Time (Minutes): 30, Frequency (Times/Week): 3, Weekly Exercise (Minutes/Week): 90, Intensity: Mild, Exercise limited by: None identified  Goals    . Increase water intake     Recommend drinking at least 6-8 glasses of water a day        Fall Risk: Fall Risk  09/09/2019 02/03/2019 09/04/2018 08/29/2017  Falls in the past year? 0 0 No No  Number falls in past yr: - 0 - -  Injury with Fall? - 0 - -    FALL RISK PREVENTION PERTAINING TO THE HOME:  Any stairs in or around the home? Yes  If so, are there any without handrails? No   Home free of loose throw rugs in walkways, pet beds, electrical cords, etc? Yes  Adequate lighting in your home to reduce risk of falls? Yes   ASSISTIVE DEVICES UTILIZED TO PREVENT FALLS:  Life alert? No  Use of a cane, walker or w/c? No  Grab bars in the bathroom? Yes  Shower chair or bench in shower? No  Elevated toilet seat or a handicapped toilet? No   TIMED UP AND GO:  Unable to perform   Depression Screen PHQ 2/9 Scores 09/09/2019 02/03/2019 09/04/2018 08/29/2017  PHQ - 2 Score 0 0 0 0  PHQ- 9 Score - 0 - -    Cognitive Function     6CIT Screen 09/04/2018 08/29/2017  What Year? 0 points 0 points  What month? 0 points 0 points  What time? 0 points 0 points  Count back from 20 0 points 0 points  Months in reverse 0 points 0 points  Repeat phrase 0 points 0 points  Total Score 0 0    Immunization History  Administered Date(s) Administered  . Influenza, High Dose Seasonal PF 10/09/2016, 09/04/2018  . Influenza-Unspecified 10/24/2017, 09/08/2019  . Pneumococcal Polysaccharide-23 08/29/2017  . Td 02/01/2016    Qualifies for Shingles Vaccine? Yes  Zostavax completed n/a. Due for Shingrix. Education has been provided regarding the importance of this vaccine. Pt has been advised to call insurance company to determine out of pocket expense. Advised may  also receive vaccine at local pharmacy or Health Dept. Verbalized acceptance and understanding.  Tdap: done 3-4 years ago   Flu Vaccine: completed 09/08/2019.   Pneumococcal Vaccine: up to date   Screening Tests Health Maintenance  Topic Date Due  . INFLUENZA VACCINE  08/01/2019  . TETANUS/TDAP  02/04/2020 (Originally 09/13/1950)  . PNA vac Low Risk Adult  Completed   Cancer Screenings:  Colorectal Screening: no longer required   Lung Cancer Screening: (Low Dose CT Chest recommended if Age 71-80 years, 30 pack-year currently smoking OR have quit w/in 15years.) does not qualify.    Additional Screening:  Hepatitis C Screening: does not qualify  Vision Screening: Recommended annual ophthalmology exams for early detection of glaucoma and other disorders of the eye. Is the patient up to date with their annual eye exam?  No   Dental Screening: Recommended annual dental exams for proper oral hygiene  Community Resource Referral:  CRR required this visit?  No        Plan:  I have personally reviewed and addressed the Medicare Annual Wellness questionnaire and have noted the following in the patient's chart:  A. Medical and social history B. Use of alcohol, tobacco or illicit drugs  C. Current medications and supplements D. Functional ability and status E.  Nutritional status F.  Physical activity G. Advance directives H. List of other physicians I.  Hospitalizations, surgeries, and ER visits in previous 12 months J.  [place] such as hearing and vision if needed, cognitive and depression L. Referrals and appointments   In addition, I have reviewed and discussed with patient certain preventive protocols, quality metrics, and best practice recommendations. A written personalized care plan for preventive services as well as general preventive health recommendations were provided to patient.  Signed,   Bevelyn Ngo, LPN  07/02/7105 Nurse Health Advisor    Nurse Notes: none

## 2019-09-09 NOTE — Patient Instructions (Signed)
Gregg Thomas , Thank you for taking time to come for your Medicare Wellness Visit. I appreciate your ongoing commitment to your health goals. Please review the following plan we discussed and let me know if I can assist you in the future.   Screening recommendations/referrals: Colonoscopy: no longer required Recommended yearly ophthalmology/optometry visit for glaucoma screening and checkup Recommended yearly dental visit for hygiene and checkup  Vaccinations: Influenza vaccine: up to date Pneumococcal vaccine: up to date Tdap vaccine: up to date Shingles vaccine: shingrix eligible    Advanced directives: Please bring a copy of your health care power of attorney and living will to the office at your convenience.  Conditions/risks identified: none  Next appointment: follow up in one year for your annual wellness visit   Preventive Care 65 Years and Older, Male Preventive care refers to lifestyle choices and visits with your health care provider that can promote health and wellness. What does preventive care include?  A yearly physical exam. This is also called an annual well check.  Dental exams once or twice a year.  Routine eye exams. Ask your health care provider how often you should have your eyes checked.  Personal lifestyle choices, including:  Daily care of your teeth and gums.  Regular physical activity.  Eating a healthy diet.  Avoiding tobacco and drug use.  Limiting alcohol use.  Practicing safe sex.  Taking low doses of aspirin every day.  Taking vitamin and mineral supplements as recommended by your health care provider. What happens during an annual well check? The services and screenings done by your health care provider during your annual well check will depend on your age, overall health, lifestyle risk factors, and family history of disease. Counseling  Your health care provider may ask you questions about your:  Alcohol use.  Tobacco use.   Drug use.  Emotional well-being.  Home and relationship well-being.  Sexual activity.  Eating habits.  History of falls.  Memory and ability to understand (cognition).  Work and work Statistician. Screening  You may have the following tests or measurements:  Height, weight, and BMI.  Blood pressure.  Lipid and cholesterol levels. These may be checked every 5 years, or more frequently if you are over 35 years old.  Skin check.  Lung cancer screening. You may have this screening every year starting at age 51 if you have a 30-pack-year history of smoking and currently smoke or have quit within the past 15 years.  Fecal occult blood test (FOBT) of the stool. You may have this test every year starting at age 22.  Flexible sigmoidoscopy or colonoscopy. You may have a sigmoidoscopy every 5 years or a colonoscopy every 10 years starting at age 36.  Prostate cancer screening. Recommendations will vary depending on your family history and other risks.  Hepatitis C blood test.  Hepatitis B blood test.  Sexually transmitted disease (STD) testing.  Diabetes screening. This is done by checking your blood sugar (glucose) after you have not eaten for a while (fasting). You may have this done every 1-3 years.  Abdominal aortic aneurysm (AAA) screening. You may need this if you are a current or former smoker.  Osteoporosis. You may be screened starting at age 70 if you are at high risk. Talk with your health care provider about your test results, treatment options, and if necessary, the need for more tests. Vaccines  Your health care provider may recommend certain vaccines, such as:  Influenza vaccine. This is recommended  every year.  Tetanus, diphtheria, and acellular pertussis (Tdap, Td) vaccine. You may need a Td booster every 10 years.  Zoster vaccine. You may need this after age 64.  Pneumococcal 13-valent conjugate (PCV13) vaccine. One dose is recommended after age 44.   Pneumococcal polysaccharide (PPSV23) vaccine. One dose is recommended after age 22. Talk to your health care provider about which screenings and vaccines you need and how often you need them. This information is not intended to replace advice given to you by your health care provider. Make sure you discuss any questions you have with your health care provider. Document Released: 01/13/2016 Document Revised: 09/05/2016 Document Reviewed: 10/18/2015 Elsevier Interactive Patient Education  2017 [place] Prevention in the Home Falls can cause injuries. They can happen to people of all ages. There are many things you can do to make your home safe and to help prevent falls. What can I do on the outside of my home?  Regularly fix the edges of walkways and driveways and fix any cracks.  Remove anything that might make you trip as you walk through a door, such as a raised step or threshold.  Trim any bushes or trees on the path to your home.  Use bright outdoor lighting.  Clear any walking paths of anything that might make someone trip, such as rocks or tools.  Regularly check to see if handrails are loose or broken. Make sure that both sides of any steps have handrails.  Any raised decks and porches should have guardrails on the edges.  Have any leaves, snow, or ice cleared regularly.  Use sand or salt on walking paths during winter.  Clean up any spills in your garage right away. This includes oil or grease spills. What can I do in the bathroom?  Use night lights.  Install grab bars by the toilet and in the tub and shower. Do not use towel bars as grab bars.  Use non-skid mats or decals in the tub or shower.  If you need to sit down in the shower, use a plastic, non-slip stool.  Keep the floor dry. Clean up any water that spills on the floor as soon as it happens.  Remove soap buildup in the tub or shower regularly.  Attach bath mats securely with double-sided non-slip  rug tape.  Do not have throw rugs and other things on the floor that can make you trip. What can I do in the bedroom?  Use night lights.  Make sure that you have a light by your bed that is easy to reach.  Do not use any sheets or blankets that are too big for your bed. They should not hang down onto the floor.  Have a firm chair that has side arms. You can use this for support while you get dressed.  Do not have throw rugs and other things on the floor that can make you trip. What can I do in the kitchen?  Clean up any spills right away.  Avoid walking on wet floors.  Keep items that you use a lot in easy-to-reach places.  If you need to reach something above you, use a strong step stool that has a grab bar.  Keep electrical cords out of the way.  Do not use floor polish or wax that makes floors slippery. If you must use wax, use non-skid floor wax.  Do not have throw rugs and other things on the floor that can make you trip. What  can I do with my stairs?  Do not leave any items on the stairs.  Make sure that there are handrails on both sides of the stairs and use them. Fix handrails that are broken or loose. Make sure that handrails are as long as the stairways.  Check any carpeting to make sure that it is firmly attached to the stairs. Fix any carpet that is loose or worn.  Avoid having throw rugs at the top or bottom of the stairs. If you do have throw rugs, attach them to the floor with carpet tape.  Make sure that you have a light switch at the top of the stairs and the bottom of the stairs. If you do not have them, ask someone to add them for you. What else can I do to help prevent falls?  Wear shoes that:  Do not have high heels.  Have rubber bottoms.  Are comfortable and fit you well.  Are closed at the toe. Do not wear sandals.  If you use a stepladder:  Make sure that it is fully opened. Do not climb a closed stepladder.  Make sure that both sides of  the stepladder are locked into place.  Ask someone to hold it for you, if possible.  Clearly mark and make sure that you can see:  Any grab bars or handrails.  First and last steps.  Where the edge of each step is.  Use tools that help you move around (mobility aids) if they are needed. These include:  Canes.  Walkers.  Scooters.  Crutches.  Turn on the lights when you go into a dark area. Replace any light bulbs as soon as they burn out.  Set up your furniture so you have a clear path. Avoid moving your furniture around.  If any of your floors are uneven, fix them.  If there are any pets around you, be aware of where they are.  Review your medicines with your doctor. Some medicines can make you feel dizzy. This can increase your chance of falling. Ask your doctor what other things that you can do to help prevent falls. This information is not intended to replace advice given to you by your health care provider. Make sure you discuss any questions you have with your health care provider. Document Released: 10/13/2009 Document Revised: 05/24/2016 Document Reviewed: 01/21/2015 Elsevier Interactive Patient Education  2017 [employer].

## 2019-09-25 ENCOUNTER — Other Ambulatory Visit: Payer: Self-pay

## 2019-09-28 ENCOUNTER — Telehealth: Payer: Self-pay

## 2019-09-28 ENCOUNTER — Other Ambulatory Visit: Payer: Self-pay

## 2019-09-28 ENCOUNTER — Inpatient Hospital Stay: Payer: Medicare Other

## 2019-09-28 ENCOUNTER — Inpatient Hospital Stay: Payer: Medicare Other | Attending: Internal Medicine

## 2019-09-28 DIAGNOSIS — I82501 Chronic embolism and thrombosis of unspecified deep veins of right lower extremity: Secondary | ICD-10-CM

## 2019-09-28 DIAGNOSIS — I2782 Chronic pulmonary embolism: Secondary | ICD-10-CM

## 2019-09-28 DIAGNOSIS — Z86711 Personal history of pulmonary embolism: Secondary | ICD-10-CM | POA: Diagnosis present

## 2019-09-28 DIAGNOSIS — Z7901 Long term (current) use of anticoagulants: Secondary | ICD-10-CM | POA: Diagnosis present

## 2019-09-28 DIAGNOSIS — Z86718 Personal history of other venous thrombosis and embolism: Secondary | ICD-10-CM | POA: Diagnosis not present

## 2019-09-28 LAB — PROTIME-INR
INR: 3.3 — ABNORMAL HIGH (ref 0.8–1.2)
Prothrombin Time: 32.9 seconds — ABNORMAL HIGH (ref 11.4–15.2)

## 2019-09-28 NOTE — Telephone Encounter (Signed)
-----   Message from Lequita Asal, MD sent at 09/28/2019  4:13 PM EDT ----- Regarding: Please call patient  INR is a little high (3.3).  Any change in medications or diarrhea?  What dose Coumadin is he taking?    We need to make a slight adjustment in Coumadin and recheck INR in 1 week.  M ----- Message ----- From: Buel Ream, Lab In [place] Sent: 09/28/2019   2:59 PM EDT To: Lequita Asal, MD

## 2019-09-28 NOTE — Telephone Encounter (Signed)
Informed patient of elevated INR. Patient currently takes 5MG  daily except for Friday's he takes 7.5 MG. Informed Dr. Mike Gip and she would like for him to decrease to 5 MG daily. Informed patient and that he would need to recheck INR in 1 week. Patient verbalizes understanding and denies any further questions or concerns.

## 2019-10-06 ENCOUNTER — Inpatient Hospital Stay: Payer: Medicare Other | Attending: Hematology and Oncology

## 2019-10-06 ENCOUNTER — Other Ambulatory Visit: Payer: Self-pay

## 2019-10-06 ENCOUNTER — Telehealth: Payer: Self-pay

## 2019-10-06 DIAGNOSIS — Z86711 Personal history of pulmonary embolism: Secondary | ICD-10-CM | POA: Insufficient documentation

## 2019-10-06 DIAGNOSIS — Z86718 Personal history of other venous thrombosis and embolism: Secondary | ICD-10-CM | POA: Diagnosis not present

## 2019-10-06 DIAGNOSIS — Z7901 Long term (current) use of anticoagulants: Secondary | ICD-10-CM | POA: Diagnosis present

## 2019-10-06 DIAGNOSIS — I82501 Chronic embolism and thrombosis of unspecified deep veins of right lower extremity: Secondary | ICD-10-CM

## 2019-10-06 LAB — PROTIME-INR
INR: 2.9 — ABNORMAL HIGH (ref 0.8–1.2)
Prothrombin Time: 30.1 seconds — ABNORMAL HIGH (ref 11.4–15.2)

## 2019-10-06 NOTE — Telephone Encounter (Signed)
Spoke with the patient to inform him of his lab results INR was 2.9. continue current does of coumadin and continue regular labs. The patient was understanding and agreeable.

## 2019-10-06 NOTE — Telephone Encounter (Signed)
-----   Message from Lequita Asal, MD sent at 10/06/2019  1:59 PM EDT ----- Regarding: Please call patient  INR is good at 2.9.  Continue current dose of Coumadin.  Continue regular lab checks.  M ----- Message ----- From: Buel Ream, Lab In [place] Sent: 10/06/2019  10:27 AM EDT To: Lequita Asal, MD

## 2019-10-26 ENCOUNTER — Inpatient Hospital Stay: Payer: Medicare Other

## 2019-10-26 ENCOUNTER — Telehealth: Payer: Self-pay

## 2019-10-26 ENCOUNTER — Other Ambulatory Visit: Payer: Self-pay

## 2019-10-26 DIAGNOSIS — Z86718 Personal history of other venous thrombosis and embolism: Secondary | ICD-10-CM | POA: Diagnosis not present

## 2019-10-26 DIAGNOSIS — I2782 Chronic pulmonary embolism: Secondary | ICD-10-CM

## 2019-10-26 DIAGNOSIS — I82501 Chronic embolism and thrombosis of unspecified deep veins of right lower extremity: Secondary | ICD-10-CM

## 2019-10-26 LAB — PROTIME-INR
INR: 2.9 — ABNORMAL HIGH (ref 0.8–1.2)
Prothrombin Time: 30 seconds — ABNORMAL HIGH (ref 11.4–15.2)

## 2019-10-26 NOTE — Telephone Encounter (Signed)
Left a message to infrom the patient that his INR was 2.9 and continue current does of Coumadin.

## 2019-10-26 NOTE — Telephone Encounter (Signed)
-----   Message from Lequita Asal, MD sent at 10/26/2019 12:54 PM EDT ----- Regarding: Please call patient  INR was 2.9.  Continue current dose of Coumadin.  Follow-up INR per schedule.  M ----- Message ----- From: Buel Ream, Lab In [place] Sent: 10/26/2019  11:56 AM EDT To: Lequita Asal, MD

## 2019-11-20 ENCOUNTER — Other Ambulatory Visit: Payer: Self-pay

## 2019-11-23 ENCOUNTER — Inpatient Hospital Stay: Payer: Medicare Other

## 2019-11-23 ENCOUNTER — Telehealth: Payer: Self-pay

## 2019-11-23 ENCOUNTER — Inpatient Hospital Stay: Payer: Medicare Other | Attending: Internal Medicine | Admitting: Internal Medicine

## 2019-11-23 ENCOUNTER — Other Ambulatory Visit: Payer: Self-pay

## 2019-11-23 DIAGNOSIS — Z820 Family history of epilepsy and other diseases of the nervous system: Secondary | ICD-10-CM | POA: Diagnosis not present

## 2019-11-23 DIAGNOSIS — Z79899 Other long term (current) drug therapy: Secondary | ICD-10-CM | POA: Insufficient documentation

## 2019-11-23 DIAGNOSIS — Z7901 Long term (current) use of anticoagulants: Secondary | ICD-10-CM | POA: Insufficient documentation

## 2019-11-23 DIAGNOSIS — C44320 Squamous cell carcinoma of skin of unspecified parts of face: Secondary | ICD-10-CM | POA: Diagnosis not present

## 2019-11-23 DIAGNOSIS — N183 Chronic kidney disease, stage 3 unspecified: Secondary | ICD-10-CM | POA: Diagnosis not present

## 2019-11-23 DIAGNOSIS — I82402 Acute embolism and thrombosis of unspecified deep veins of left lower extremity: Secondary | ICD-10-CM | POA: Insufficient documentation

## 2019-11-23 DIAGNOSIS — I82512 Chronic embolism and thrombosis of left femoral vein: Secondary | ICD-10-CM | POA: Insufficient documentation

## 2019-11-23 DIAGNOSIS — Z86711 Personal history of pulmonary embolism: Secondary | ICD-10-CM | POA: Diagnosis not present

## 2019-11-23 DIAGNOSIS — I82501 Chronic embolism and thrombosis of unspecified deep veins of right lower extremity: Secondary | ICD-10-CM

## 2019-11-23 DIAGNOSIS — Z823 Family history of stroke: Secondary | ICD-10-CM | POA: Insufficient documentation

## 2019-11-23 LAB — CBC WITH DIFFERENTIAL/PLATELET
Abs Immature Granulocytes: 0.01 10*3/uL (ref 0.00–0.07)
Basophils Absolute: 0 10*3/uL (ref 0.0–0.1)
Basophils Relative: 1 %
Eosinophils Absolute: 0.2 10*3/uL (ref 0.0–0.5)
Eosinophils Relative: 3 %
HCT: 43.8 % (ref 39.0–52.0)
Hemoglobin: 14.6 g/dL (ref 13.0–17.0)
Immature Granulocytes: 0 %
Lymphocytes Relative: 31 %
Lymphs Abs: 1.6 10*3/uL (ref 0.7–4.0)
MCH: 32.3 pg (ref 26.0–34.0)
MCHC: 33.3 g/dL (ref 30.0–36.0)
MCV: 96.9 fL (ref 80.0–100.0)
Monocytes Absolute: 0.6 10*3/uL (ref 0.1–1.0)
Monocytes Relative: 11 %
Neutro Abs: 2.9 10*3/uL (ref 1.7–7.7)
Neutrophils Relative %: 54 %
Platelets: 193 10*3/uL (ref 150–400)
RBC: 4.52 MIL/uL (ref 4.22–5.81)
RDW: 13 % (ref 11.5–15.5)
WBC: 5.3 10*3/uL (ref 4.0–10.5)
nRBC: 0 % (ref 0.0–0.2)

## 2019-11-23 LAB — COMPREHENSIVE METABOLIC PANEL
ALT: 21 U/L (ref 0–44)
AST: 25 U/L (ref 15–41)
Albumin: 3.9 g/dL (ref 3.5–5.0)
Alkaline Phosphatase: 80 U/L (ref 38–126)
Anion gap: 5 (ref 5–15)
BUN: 25 mg/dL — ABNORMAL HIGH (ref 8–23)
CO2: 23 mmol/L (ref 22–32)
Calcium: 9.4 mg/dL (ref 8.9–10.3)
Chloride: 110 mmol/L (ref 98–111)
Creatinine, Ser: 2 mg/dL — ABNORMAL HIGH (ref 0.61–1.24)
GFR calc Af Amer: 34 mL/min — ABNORMAL LOW (ref >60)
GFR calc non Af Amer: 29 mL/min — ABNORMAL LOW (ref >60)
Glucose, Bld: 108 mg/dL — ABNORMAL HIGH (ref 70–99)
Potassium: 4.1 mmol/L (ref 3.5–5.1)
Sodium: 138 mmol/L (ref 135–145)
Total Bilirubin: 1 mg/dL (ref 0.3–1.2)
Total Protein: 7.2 g/dL (ref 6.5–8.1)

## 2019-11-23 LAB — PROTIME-INR
INR: 2.5 — ABNORMAL HIGH (ref 0.8–1.2)
Prothrombin Time: 26.7 seconds — ABNORMAL HIGH (ref 11.4–15.2)

## 2019-11-23 MED ORDER — APIXABAN 2.5 MG PO TABS: 2.5000 mg | ORAL_TABLET | Freq: Two times a day (BID) | ORAL | 6 refills | Status: DC

## 2019-11-23 NOTE — Assessment & Plan Note (Addendum)
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  Fairly stable.  INR from today pending.  #Had a long discussion with patient and his wife regarding switching over to NOACs like Eliquis-given the need for monthly blood checks/dietary restrictions/patient preference.  #Discussed with pharmacy having issues with meeting the deductible [to the end of the year]; we will reevaluate restarting the patient on Eliquis next year/after meeting deductible.  Patient interested; will re-initiate next year.   #CKD-III [GFR 30s]- Dr.Lateef.   # DISPOSITION: # PT/INR every month x2 # in 3 months- MD; cbc/bmp/Pt/INR-Dr.B

## 2019-11-23 NOTE — Telephone Encounter (Signed)
-----   Message from Lequita Asal, MD sent at 11/23/2019 12:26 PM EST ----- Regarding: Please call patient  INR looks good.  Continue current dose of Coumadin.  M ----- Message ----- From: Buel Ream, Lab In [place] Sent: 11/23/2019  10:25 AM EST To: Lequita Asal, MD

## 2019-11-23 NOTE — Progress Notes (Signed)
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  male history of chronic left lower extremity DVT on Coumadin and history of CKD-III is here for follow-up.   Patient had been on Coumadin for many years; complains of easy bruising otherwise no spontaneous bleeding or gum bleeding.   Patient denies any blood in stools or black or stools. No Nausea vomiting.   Review of Systems  Constitutional: Negative for chills, diaphoresis, fever, malaise/fatigue and weight loss.  HENT: Negative for nosebleeds and sore throat.   Eyes: Negative for double vision.  Respiratory: Negative for cough, hemoptysis, sputum production, shortness of breath and wheezing.   Cardiovascular: Negative for chest pain, palpitations, orthopnea and leg swelling.  Gastrointestinal: Negative for abdominal pain, blood in stool, constipation, diarrhea, heartburn, melena, nausea and vomiting.  Genitourinary: Negative for dysuria, frequency and urgency.  Musculoskeletal: Negative for back pain and joint pain.  Skin: Negative.  Negative for itching and rash.  Neurological: Negative for dizziness, tingling, focal weakness, weakness and headaches.  Endo/Heme/Allergies: Bruises/bleeds easily.  Psychiatric/Behavioral: Negative for depression. The patient is not nervous/anxious and does not have insomnia.       MEDICAL HISTORY:  Past Medical History:  Diagnosis Date  . Chronic kidney disease   . Clotting disorder ([place])   . Heart murmur   . Skin cancer     SURGICAL HISTORY: Past Surgical History:  Procedure Laterality Date  . biopsy Right 08/26/2019   temple biopsy - dr.dasher   . EYE SURGERY Right    benign growth on right eye   . KNEE SURGERY Right   . MELANOMA EXCISION Bilateral    lower back     SOCIAL HISTORY: Social History   Socioeconomic History  . Marital status: Married    Spouse name: Not on file  . Number of children: Not on file  . Years of education: 13 years   . Highest education level: Some college, no degree  Occupational History  . Occupation: retired  Scientific laboratory technician  . Financial resource strain: Not hard at all  . Food insecurity    Worry: Never true    Inability: Never true  . Transportation needs    Medical: No    Non-medical: No  Tobacco Use  . Smoking status: Never Smoker  . Smokeless tobacco: Never Used  Substance and Sexual Activity  . Alcohol use: No  . Drug use: No  . Sexual activity: Not on file  Lifestyle  . Physical activity    Days per week: 3 days    Minutes per session: 30 min  . Stress: Not at all  Relationships  . Social connections    Talks on phone: More than three times a week    Gets together: More than three times a week    Attends religious service: More than 4 times per year    Active member of club or organization: Yes    Attends meetings  of clubs or organizations: More than 4 times per year    Relationship status: Married  . Intimate partner violence    Fear of current or ex partner: No    Emotionally abused: No    Physically abused: No    Forced sexual activity: No  Other Topics Concern  . Not on file  Social History Narrative   [place] 2 times a week, alk 3 times a week     FAMILY HISTORY: Family History  Problem Relation Age of Onset  .  Stroke Mother   . Cerebral aneurysm Father     ALLERGIES:  has No Known Allergies.  MEDICATIONS:  Current Outpatient Medications  Medication Sig Dispense Refill  . amLODipine (NORVASC) 10 MG tablet Take 10 mg by mouth daily.     Marland Kitchen Apoaequorin (PREVAGEN PO) Take 1 tablet by mouth daily.    . Cyanocobalamin (RA VITAMIN B-12 TR) 1000 MCG TBCR Take 1 tablet by mouth daily. 3-4 times a week    . enalapril (VASOTEC) 20 MG tablet Take 20 mg by mouth daily.     . Grape Seed Extract 50 MG CAPS Take 1,300 mg by mouth daily.     Marland Kitchen warfarin (COUMADIN) 2.5 MG tablet Take 1 tab (2.5 mg) 1 day a week, in addition to 5 mg tab for a total of 7.5 mg ONCE A WEEK. 30 tablet 0  . warfarin (COUMADIN) 5 MG tablet Take one 5 mg tablet 6 days a week and take one 5 mg tablet with a 2.5 mg tablet (7.5 mg) one day a week 30 tablet 1  . apixaban (ELIQUIS) 2.5 MG TABS tablet Take 1 tablet (2.5 mg total) by mouth 2 (two) times daily. 60 tablet 6   No current facility-administered medications for this visit.       Marland Kitchen  PHYSICAL EXAMINATION: ECOG PERFORMANCE STATUS: 0 - Asymptomatic  Vitals:   11/23/19 1029  BP: (!) 142/64  Pulse: 76  Resp: 19  Temp: (!) 97.2 F (36.2 C)  SpO2: 99%   Filed Weights   11/23/19 1029  Weight: 173 lb 3.2 oz (78.6 kg)    Physical Exam  Constitutional: He is oriented to person, place, and time and well-developed, well-nourished, and in no distress.  Elderly appearing Caucasian male patient accompanied by his wife.  Is walking himself.  HENT:  Head: Normocephalic and atraumatic.  Mouth/Throat: Oropharynx is clear and moist. No oropharyngeal exudate.  Eyes: Pupils are equal, round, and reactive to light.  Neck: Normal range of motion. Neck supple.  Cardiovascular: Normal rate and regular rhythm.  Pulmonary/Chest: Effort normal and breath sounds normal. No respiratory distress. He has no wheezes.  Abdominal: Soft. Bowel sounds are normal. He exhibits no distension and no mass.  There is no abdominal tenderness. There is no rebound and no guarding.  Musculoskeletal: Normal range of motion.        General: No tenderness or edema.  Neurological: He is alert and oriented to person, place, and time.  Skin: Skin is warm.  Psychiatric: Affect normal.     LABORATORY DATA:  I have reviewed the data as listed Lab Results  Component Value Date   WBC 5.3 11/23/2019   HGB 14.6 11/23/2019   HCT 43.8 11/23/2019   MCV 96.9 11/23/2019   PLT 193 11/23/2019   Recent Labs    02/03/19 1029 06/05/19 1430 11/23/19 1012  NA 143  138 138  K 4.4 4.7 4.1  CL 108* 110 110  CO2 _0 GLUCOSE 89 118* 108*  BUN 23 33* 25*  CREATININE 1.90* 2.21* 2.00*  CALCIUM 9.9 9.4 9.4  GFRNONAA 31* 26* 29*  GFRAA 36* 30* 34*  PROT 6.8 7.1 7.2  ALBUMIN 4.1 3.9 3.9  AST _1 ALT _2 ALKPHOS 94 76 80  BILITOT 0.5 0.7 1.0    RADIOGRAPHIC STUDIES: I have personally reviewed the radiological images as listed and agreed with the findings in the report. No results found.  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  Fairly stable.  INR from today pending.  #Had a long discussion with patient and his wife regarding switching over to NOACs like Eliquis-given the need for monthly blood checks/dietary restrictions/patient preference.  #Discussed with pharmacy having issues with meeting the deductible [to the end of the year]; we will reevaluate restarting the patient on Eliquis next year/after meeting deductible.  Patient interested; will re-initiate next year.   #CKD-III [GFR 30s]- Dr.Lateef.   # DISPOSITION: # PT/INR every month x2 # in 3 months- MD; cbc/bmp/Pt/INR-Dr.B  All questions were answered. The patient knows to call the clinic with any problems, questions or concerns.    Cammie Sickle, MD 11/23/2019 12:25  PM

## 2019-11-23 NOTE — Telephone Encounter (Signed)
Left a message to inform the patient that his INR was good and continue current does of Coumadin.

## 2019-12-21 ENCOUNTER — Other Ambulatory Visit: Payer: Self-pay

## 2019-12-21 ENCOUNTER — Inpatient Hospital Stay: Payer: Medicare Other | Attending: Internal Medicine

## 2019-12-21 DIAGNOSIS — I82512 Chronic embolism and thrombosis of left femoral vein: Secondary | ICD-10-CM | POA: Diagnosis not present

## 2019-12-21 LAB — PROTIME-INR
INR: 2.4 — ABNORMAL HIGH (ref 0.8–1.2)
Prothrombin Time: 25.7 seconds — ABNORMAL HIGH (ref 11.4–15.2)

## 2020-01-18 ENCOUNTER — Telehealth: Payer: Self-pay | Admitting: *Deleted

## 2020-01-18 ENCOUNTER — Inpatient Hospital Stay: Payer: Medicare Other | Attending: Hematology and Oncology | Admitting: *Deleted

## 2020-01-18 ENCOUNTER — Other Ambulatory Visit: Payer: Self-pay

## 2020-01-18 DIAGNOSIS — I82512 Chronic embolism and thrombosis of left femoral vein: Secondary | ICD-10-CM | POA: Insufficient documentation

## 2020-01-18 LAB — PROTIME-INR
INR: 2 — ABNORMAL HIGH (ref 0.8–1.2)
Prothrombin Time: 22.9 seconds — ABNORMAL HIGH (ref 11.4–15.2)

## 2020-01-18 NOTE — Telephone Encounter (Signed)
Spoke with patient. INR therapeutic range. Pt informed to keep apt as scheduled apts and plan of care and dosing of coumadin. Patient taking warfarin 2.5 daily and 7.5 mg daily one day a week.

## 2020-01-18 NOTE — Progress Notes (Signed)
H/T-please inform patient that INR is therapeutic; continue current dose of Coumadin.  Follow-up as planned

## 2020-01-18 NOTE — Telephone Encounter (Signed)
-----   Message from Cammie Sickle, MD sent at 01/18/2020 12:42 PM EST ----- H/T-please inform patient that INR is therapeutic; continue current dose of Coumadin.  Follow-up as planned

## 2020-01-19 ENCOUNTER — Telehealth: Payer: Self-pay | Admitting: Licensed Clinical Social Worker

## 2020-01-19 NOTE — Telephone Encounter (Signed)
I tried to contact patient but no one answered and I did not have the option to leave a message

## 2020-01-19 NOTE — Telephone Encounter (Signed)
-----   Message from Cammie Sickle, MD sent at 01/18/2020 12:42 PM EST ----- H/T-please inform patient that INR is therapeutic; continue current dose of Coumadin.  Follow-up as planned

## 2020-02-04 ENCOUNTER — Other Ambulatory Visit: Payer: Self-pay | Admitting: Hematology and Oncology

## 2020-02-04 NOTE — Telephone Encounter (Signed)
...    Ref Range & Units 2 wk ago 1 mo ago 2 mo ago  Prothrombin Time 11.4 - 15.2 seconds 22.9High   25.7High   26.7High    INR 0.8 - 1.2 2.0High   2.4High  CM  2.5High  CM   Comment: (NOTE)  INR goal varies based on device and disease states.  Performed at [facility], [address] [place],  [place] [address]   Resulting Agency  [facility] CLIN LAB [facility] CL[facility] LAB [facility] CL[facility] LAB      Specimen Collected: 01/18/20 10:35 Last Resulted: 01/18/20 11:30

## 2020-02-09 ENCOUNTER — Encounter: Payer: Medicare Other | Admitting: Family Medicine

## 2020-02-15 ENCOUNTER — Other Ambulatory Visit: Payer: Self-pay

## 2020-02-15 ENCOUNTER — Inpatient Hospital Stay (HOSPITAL_BASED_OUTPATIENT_CLINIC_OR_DEPARTMENT_OTHER): Payer: Medicare Other | Admitting: Internal Medicine

## 2020-02-15 ENCOUNTER — Inpatient Hospital Stay: Payer: Medicare Other | Attending: Internal Medicine

## 2020-02-15 ENCOUNTER — Encounter: Payer: Self-pay | Admitting: Internal Medicine

## 2020-02-15 VITALS — BP 129/64 | HR 73 | Temp 96.9°F | Resp 20 | Ht 67.0 in

## 2020-02-15 DIAGNOSIS — Z79899 Other long term (current) drug therapy: Secondary | ICD-10-CM | POA: Diagnosis not present

## 2020-02-15 DIAGNOSIS — Z86711 Personal history of pulmonary embolism: Secondary | ICD-10-CM | POA: Insufficient documentation

## 2020-02-15 DIAGNOSIS — Z7901 Long term (current) use of anticoagulants: Secondary | ICD-10-CM | POA: Insufficient documentation

## 2020-02-15 DIAGNOSIS — I82512 Chronic embolism and thrombosis of left femoral vein: Secondary | ICD-10-CM | POA: Diagnosis not present

## 2020-02-15 DIAGNOSIS — N183 Chronic kidney disease, stage 3 unspecified: Secondary | ICD-10-CM | POA: Diagnosis not present

## 2020-02-15 DIAGNOSIS — C44320 Squamous cell carcinoma of skin of unspecified parts of face: Secondary | ICD-10-CM | POA: Diagnosis not present

## 2020-02-15 DIAGNOSIS — Z823 Family history of stroke: Secondary | ICD-10-CM | POA: Diagnosis not present

## 2020-02-15 DIAGNOSIS — I82502 Chronic embolism and thrombosis of unspecified deep veins of left lower extremity: Secondary | ICD-10-CM | POA: Diagnosis present

## 2020-02-15 DIAGNOSIS — Z8249 Family history of ischemic heart disease and other diseases of the circulatory system: Secondary | ICD-10-CM | POA: Insufficient documentation

## 2020-02-15 LAB — PROTIME-INR
INR: 2.3 — ABNORMAL HIGH (ref 0.8–1.2)
Prothrombin Time: 24.8 seconds — ABNORMAL HIGH (ref 11.4–15.2)

## 2020-02-15 LAB — BASIC METABOLIC PANEL
Anion gap: 9 (ref 5–15)
BUN: 26 mg/dL — ABNORMAL HIGH (ref 8–23)
CO2: 23 mmol/L (ref 22–32)
Calcium: 10.2 mg/dL (ref 8.9–10.3)
Chloride: 108 mmol/L (ref 98–111)
Creatinine, Ser: 2.01 mg/dL — ABNORMAL HIGH (ref 0.61–1.24)
GFR calc Af Amer: 34 mL/min — ABNORMAL LOW (ref >60)
GFR calc non Af Amer: 29 mL/min — ABNORMAL LOW (ref >60)
Glucose, Bld: 122 mg/dL — ABNORMAL HIGH (ref 70–99)
Potassium: 4.1 mmol/L (ref 3.5–5.1)
Sodium: 140 mmol/L (ref 135–145)

## 2020-02-15 LAB — CBC WITH DIFFERENTIAL/PLATELET
Abs Immature Granulocytes: 0.02 10*3/uL (ref 0.00–0.07)
Basophils Absolute: 0 10*3/uL (ref 0.0–0.1)
Basophils Relative: 1 %
Eosinophils Absolute: 0.3 10*3/uL (ref 0.0–0.5)
Eosinophils Relative: 5 %
HCT: 45.8 % (ref 39.0–52.0)
Hemoglobin: 15.2 g/dL (ref 13.0–17.0)
Immature Granulocytes: 0 %
Lymphocytes Relative: 31 %
Lymphs Abs: 1.7 10*3/uL (ref 0.7–4.0)
MCH: 32.4 pg (ref 26.0–34.0)
MCHC: 33.2 g/dL (ref 30.0–36.0)
MCV: 97.7 fL (ref 80.0–100.0)
Monocytes Absolute: 0.6 10*3/uL (ref 0.1–1.0)
Monocytes Relative: 11 %
Neutro Abs: 2.9 10*3/uL (ref 1.7–7.7)
Neutrophils Relative %: 52 %
Platelets: 245 10*3/uL (ref 150–400)
RBC: 4.69 MIL/uL (ref 4.22–5.81)
RDW: 13.3 % (ref 11.5–15.5)
WBC: 5.5 10*3/uL (ref 4.0–10.5)
nRBC: 0 % (ref 0.0–0.2)

## 2020-02-15 MED ORDER — WARFARIN SODIUM 5 MG PO TABS: 5.0000 mg | ORAL_TABLET | Freq: Once | ORAL | 1 refills | Status: DC

## 2020-02-15 NOTE — Progress Notes (Signed)
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  male history of chronic left lower extremity DVT on Coumadin and history of CKD-III is here for follow-up.   Patient continues to be in Coumadin.  Denies any new blood clots.  He denies any falls.  Denies any gum bleeding or mucosal bleeding.  No blood in stools or black or stools.  No abdominal pain.  Review of Systems  Constitutional: Negative for chills, diaphoresis, fever, malaise/fatigue and weight loss.  HENT: Negative for nosebleeds and sore throat.   Eyes: Negative for double vision.  Respiratory: Negative for cough, hemoptysis, sputum production, shortness of breath and wheezing.   Cardiovascular: Negative for chest pain, palpitations, orthopnea and leg swelling.  Gastrointestinal: Negative for abdominal pain, blood in stool, constipation, diarrhea, heartburn, melena, nausea and vomiting.  Genitourinary: Negative for dysuria, frequency and urgency.  Musculoskeletal: Negative for back pain and joint pain.  Skin: Negative.  Negative for itching and rash.  Neurological: Negative for dizziness, tingling, focal weakness, weakness and headaches.  Endo/Heme/Allergies: Bruises/bleeds easily.  Psychiatric/Behavioral: Negative for depression. The patient is not nervous/anxious and does not have insomnia.       MEDICAL HISTORY:  Past Medical History:  Diagnosis Date  . Chronic kidney disease   . Clotting disorder ([place])   . Heart murmur   . Skin cancer     SURGICAL HISTORY: Past Surgical History:  Procedure Laterality Date  . biopsy Right 08/26/2019   temple biopsy - dr.dasher   . EYE SURGERY Right    benign growth on right eye   . KNEE SURGERY Right   . MELANOMA EXCISION Bilateral    lower back     SOCIAL HISTORY: Social History   Socioeconomic History  . Marital status: Married    Spouse name: Not on file  . Number of children: Not on file  . Years of education: 13 years   . Highest education level: Some college, no degree  Occupational History  . Occupation: retired  Tobacco Use  . Smoking status: Never Smoker  . Smokeless tobacco: Never Used  Substance and Sexual Activity  . Alcohol use: No  . Drug use: No  . Sexual activity: Not on file  Other Topics Concern  . Not on file  Social History Narrative   [place] 2 times a week, alk 3 times a week    Social Determinants of Radio broadcast assistant Strain:   . Difficulty of Paying Living Expenses: Not on file  Food Insecurity:   . Worried About Charity fundraiser in the Last Year: Not on file  . Ran Out of Food in the Last Year: Not on file  Transportation Needs:   . Lack of Transportation (Medical): Not on file  . Lack of Transportation (Non-Medical): Not on file  Physical Activity: Insufficiently Active  .  Days of Exercise per Week: 3 days  . Minutes of Exercise per Session: 30 min  Stress:   . Feeling of Stress : Not on file  Social Connections:   . Frequency of Communication with Friends and Family: Not on file  . Frequency of Social Gatherings with Friends and Family: Not on file  . Attends Religious Services: Not on file  . Active Member of Clubs or Organizations: Not on file  . Attends Archivist Meetings: Not on file  .  Marital Status: Not on file  Intimate Partner Violence:   . Fear of Current or Ex-Partner: Not on file  . Emotionally Abused: Not on file  . Physically Abused: Not on file  . Sexually Abused: Not on file    FAMILY HISTORY: Family History  Problem Relation Age of Onset  . Stroke Mother   . Cerebral aneurysm Father     ALLERGIES:  has No Known Allergies.  MEDICATIONS:  Current Outpatient Medications  Medication Sig Dispense Refill  . amLODipine (NORVASC) 10 MG tablet Take 10 mg by mouth daily.     . Cholecalciferol (VITAMIN D3 PO) Take 1 tablet by mouth daily.    . Cyanocobalamin (RA VITAMIN B-12 TR) 1000 MCG TBCR Take 1 tablet by mouth daily. 3-4 times a week    . ELDERBERRY PO Take 1 tablet by mouth daily.    . enalapril (VASOTEC) 20 MG tablet Take 20 mg by mouth daily.     . Grape Seed Extract 50 MG CAPS Take 1,300 mg by mouth daily.     Marland Kitchen warfarin (COUMADIN) 5 MG tablet Take 1 tablet (5 mg total) by mouth one time only at 6 PM. 90 tablet 1   No current facility-administered medications for this visit.      Marland Kitchen  PHYSICAL EXAMINATION: ECOG PERFORMANCE STATUS: 0 - Asymptomatic  Vitals:   02/15/20 1038  BP: 129/64  Pulse: 73  Resp: 20  Temp: (!) 96.9 F (36.1 C)   There were no vitals filed for this visit.  Physical Exam  Constitutional: He is oriented to person, place, and time and well-developed, well-nourished, and in no distress.  Elderly appearing Caucasian male patient accompanied by his wife.  Is walking himself.  HENT:  Head: Normocephalic and atraumatic.  Mouth/Throat: Oropharynx is clear and moist. No oropharyngeal exudate.  Eyes: Pupils are equal, round, and reactive to light.  Cardiovascular: Normal rate and regular rhythm.  Pulmonary/Chest: Effort normal and breath sounds normal. No respiratory distress. He has no wheezes.  Abdominal: Soft. Bowel sounds are normal. He exhibits no distension and no mass. There is no abdominal tenderness. There is no  rebound and no guarding.  Musculoskeletal:        General: No tenderness or edema. Normal range of motion.     Cervical back: Normal range of motion and neck supple.  Neurological: He is alert and oriented to person, place, and time.  Skin: Skin is warm.  Psychiatric: Affect normal.     LABORATORY DATA:  I have reviewed the data as listed Lab Results  Component Value Date   WBC 5.5 02/15/2020   HGB 15.2 02/15/2020   HCT 45.8 02/15/2020   MCV 97.7 02/15/2020   PLT 245 02/15/2020   Recent Labs    06/05/19 1430 11/23/19 1012 02/15/20 1021  NA 138 138 140  K 4.7 4.1 4.1  CL 110 110 108  CO2 '22 23 23  ' GLUCOSE 118* 108* 122*  BUN 33* 25*  26*  CREATININE 2.21* 2.00* 2.01*  CALCIUM 9.4 9.4 10.2  GFRNONAA 26* 29* 29*  GFRAA 30* 34* 34*  PROT 7.1 7.2  --   ALBUMIN 3.9 3.9  --   AST 25 25  --   ALT 24 21  --   ALKPHOS 76 80  --   BILITOT 0.7 1.0  --     RADIOGRAPHIC STUDIES: I have personally reviewed the radiological images as listed and agreed with the findings in the report. No results found.  ASSESSMENT & PLAN:   Chronic deep vein thrombosis (DVT) of femoral vein of left lower extremity (HCC) #Left lower extremity chronic DVT/PE [2583]-MM new thromboembolic events.  Stable- on Coumadin/warfarin- on 5 mg  A day; most recent INR 2.0.  INR from today pending.  #Long discussion with the patient and his wife regarding switching to NOACs-pros and cons in comparison with Coumadin.  However given price-and the fact the patient has been doing so well on Coumadin; continue Coumadin for now.  Patient wife in agreement.Marland Kitchen  #CKD-III [GFR 30s]-stable.  Dr.Lateef.   # DISPOSITION: # PT/INR every month x3 # in 4 months- MD; cbc/bmp/Pt/INR-Dr.B  All questions were answered. The patient knows to call the clinic with any problems, questions or concerns.    Cammie Sickle, MD 02/15/2020 11:05 AM

## 2020-02-15 NOTE — Assessment & Plan Note (Signed)
#  Left lower extremity chronic DVT/PE [9983]-JA new thromboembolic events.  Stable- on Coumadin/warfarin- on 5 mg  A day; most recent INR 2.0.  INR from today pending.  #Long discussion with the patient and his wife regarding switching to NOACs-pros and cons in comparison with Coumadin.  However given price-and the fact the patient has been doing so well on Coumadin; continue Coumadin for now.  Patient wife in agreement.Marland Kitchen  #CKD-III [GFR 30s]-stable.  Dr.Lateef.   # DISPOSITION: # PT/INR every month x3 # in 4 months- MD; cbc/bmp/Pt/INR-Dr.B

## 2020-03-14 ENCOUNTER — Inpatient Hospital Stay: Payer: Medicare Other | Attending: Hematology and Oncology

## 2020-03-14 DIAGNOSIS — I82512 Chronic embolism and thrombosis of left femoral vein: Secondary | ICD-10-CM

## 2020-03-14 LAB — CBC WITH DIFFERENTIAL/PLATELET
Abs Immature Granulocytes: 0.02 10*3/uL (ref 0.00–0.07)
Basophils Absolute: 0 10*3/uL (ref 0.0–0.1)
Basophils Relative: 1 %
Eosinophils Absolute: 0.2 10*3/uL (ref 0.0–0.5)
Eosinophils Relative: 4 %
HCT: 46.1 % (ref 39.0–52.0)
Hemoglobin: 15.6 g/dL (ref 13.0–17.0)
Immature Granulocytes: 0 %
Lymphocytes Relative: 29 %
Lymphs Abs: 1.4 10*3/uL (ref 0.7–4.0)
MCH: 32.6 pg (ref 26.0–34.0)
MCHC: 33.8 g/dL (ref 30.0–36.0)
MCV: 96.4 fL (ref 80.0–100.0)
Monocytes Absolute: 0.8 10*3/uL (ref 0.1–1.0)
Monocytes Relative: 16 %
Neutro Abs: 2.4 10*3/uL (ref 1.7–7.7)
Neutrophils Relative %: 50 %
Platelets: 203 10*3/uL (ref 150–400)
RBC: 4.78 MIL/uL (ref 4.22–5.81)
RDW: 13.2 % (ref 11.5–15.5)
WBC: 4.8 10*3/uL (ref 4.0–10.5)
nRBC: 0 % (ref 0.0–0.2)

## 2020-03-14 LAB — BASIC METABOLIC PANEL
Anion gap: 5 (ref 5–15)
BUN: 32 mg/dL — ABNORMAL HIGH (ref 8–23)
CO2: 24 mmol/L (ref 22–32)
Calcium: 9.7 mg/dL (ref 8.9–10.3)
Chloride: 110 mmol/L (ref 98–111)
Creatinine, Ser: 2.07 mg/dL — ABNORMAL HIGH (ref 0.61–1.24)
GFR calc Af Amer: 33 mL/min — ABNORMAL LOW (ref >60)
GFR calc non Af Amer: 28 mL/min — ABNORMAL LOW (ref >60)
Glucose, Bld: 93 mg/dL (ref 70–99)
Potassium: 4.4 mmol/L (ref 3.5–5.1)
Sodium: 139 mmol/L (ref 135–145)

## 2020-03-14 LAB — PROTIME-INR
INR: 1.5 — ABNORMAL HIGH (ref 0.8–1.2)
Prothrombin Time: 17.7 seconds — ABNORMAL HIGH (ref 11.4–15.2)

## 2020-03-16 ENCOUNTER — Telehealth: Payer: Self-pay | Admitting: *Deleted

## 2020-03-16 DIAGNOSIS — I82512 Chronic embolism and thrombosis of left femoral vein: Secondary | ICD-10-CM

## 2020-03-16 NOTE — Telephone Encounter (Signed)
Patient contacted regarding his pt/inr results. Results discussed and recommendation to recheck pt/inr in 2 weeks. New apt given to patient for 03/30/20. Patient gave verbal understanding of the plan of care.

## 2020-03-16 NOTE — Telephone Encounter (Signed)
-----   Message from Cammie Sickle, MD sent at 03/16/2020  9:03 AM EDT ----- H/T- please inform pt that INR - is slightly low at 1.5; but would not recomemnd changing the dose of coumadin.   Recommend checking in 2 weeks/please order.   Thanks GB

## 2020-03-25 ENCOUNTER — Telehealth: Payer: Self-pay | Admitting: *Deleted

## 2020-03-25 NOTE — Telephone Encounter (Signed)
Wife called to report that Dr Evorn Gong has put patient on new medicine that he will be taking for at least a year and wanted her to notify us in case it affects his blood counts. It is Erivedge 150 mg daily

## 2020-03-30 ENCOUNTER — Other Ambulatory Visit: Payer: Self-pay

## 2020-03-30 ENCOUNTER — Inpatient Hospital Stay: Payer: Medicare Other

## 2020-03-30 DIAGNOSIS — I82512 Chronic embolism and thrombosis of left femoral vein: Secondary | ICD-10-CM | POA: Diagnosis not present

## 2020-03-30 LAB — PROTIME-INR
INR: 1.9 — ABNORMAL HIGH (ref 0.8–1.2)
Prothrombin Time: 21.5 seconds — ABNORMAL HIGH (ref 11.4–15.2)

## 2020-04-01 ENCOUNTER — Telehealth: Payer: Self-pay | Admitting: *Deleted

## 2020-04-01 NOTE — Telephone Encounter (Signed)
-----   Message from Cammie Sickle, MD sent at 04/01/2020  8:25 AM EDT ----- H/T-please inform patient/wife-that his INR is slightly low.  However I would not recommend any changes to his Coumadin.  Please also inform pt that as he started on new medication [Erivedge] with dermatology, Dr. Adan Sis would recommend checking Coumadin levels in 2 weeks/more frequently initially.   Please order PT/INR every 2 weeks x 6.   Thanks GB

## 2020-04-01 NOTE — Telephone Encounter (Signed)
Contacted patient. Pt gave verbal understanding of the plan of care. New apts provided. Pt also requested a copy of his AVS with future apts to be mailed to him. Colette, please future mail apts to patient.

## 2020-04-04 ENCOUNTER — Telehealth: Payer: Self-pay | Admitting: *Deleted

## 2020-04-04 ENCOUNTER — Other Ambulatory Visit: Payer: Self-pay | Admitting: Hematology and Oncology

## 2020-04-04 ENCOUNTER — Other Ambulatory Visit: Payer: Self-pay | Admitting: *Deleted

## 2020-04-04 MED ORDER — WARFARIN SODIUM 5 MG PO TABS: 5.0000 mg | ORAL_TABLET | Freq: Once | ORAL | 1 refills | Status: DC

## 2020-04-04 NOTE — Telephone Encounter (Signed)
msg from Dr. Mike Gip. Patient requested Rf on coumadin. Per Dr. Rogue Bussing - v/o to send RF on Coumadin.

## 2020-04-14 ENCOUNTER — Inpatient Hospital Stay: Payer: Medicare Other | Attending: Hematology and Oncology

## 2020-04-14 ENCOUNTER — Other Ambulatory Visit: Payer: Self-pay

## 2020-04-14 DIAGNOSIS — I82512 Chronic embolism and thrombosis of left femoral vein: Secondary | ICD-10-CM

## 2020-04-14 LAB — PROTIME-INR
INR: 2.8 — ABNORMAL HIGH (ref 0.8–1.2)
Prothrombin Time: 29.4 seconds — ABNORMAL HIGH (ref 11.4–15.2)

## 2020-04-19 ENCOUNTER — Telehealth: Payer: Self-pay | Admitting: *Deleted

## 2020-04-19 NOTE — Telephone Encounter (Signed)
-----   Message from Cammie Sickle, MD sent at 04/15/2020  9:03 AM EDT ----- H/T-please inform patient that INR is within normal limits.  Continue current dose of Coumadin  Keep checking INR as planned.  GB

## 2020-04-19 NOTE — Telephone Encounter (Signed)
Spoke with patient's wife. She gave verbal understanding of the plan of care. She will provide the msg to the patient

## 2020-04-28 ENCOUNTER — Other Ambulatory Visit: Payer: Self-pay

## 2020-04-28 ENCOUNTER — Telehealth: Payer: Self-pay | Admitting: Internal Medicine

## 2020-04-28 ENCOUNTER — Inpatient Hospital Stay: Payer: Medicare Other

## 2020-04-28 DIAGNOSIS — I82512 Chronic embolism and thrombosis of left femoral vein: Secondary | ICD-10-CM | POA: Diagnosis not present

## 2020-04-28 LAB — PROTIME-INR
INR: 3 — ABNORMAL HIGH (ref 0.8–1.2)
Prothrombin Time: 29.9 seconds — ABNORMAL HIGH (ref 11.4–15.2)

## 2020-04-28 NOTE — Telephone Encounter (Signed)
H/T-please inform patient INR is 3.  Continue current dose of Coumadin/continue current INR checks as planned.

## 2020-04-29 NOTE — Telephone Encounter (Signed)
Left detailed vm for patient.

## 2020-05-12 ENCOUNTER — Inpatient Hospital Stay: Payer: Medicare Other | Attending: Hematology and Oncology

## 2020-05-12 ENCOUNTER — Other Ambulatory Visit: Payer: Self-pay

## 2020-05-12 DIAGNOSIS — I82512 Chronic embolism and thrombosis of left femoral vein: Secondary | ICD-10-CM | POA: Diagnosis not present

## 2020-05-12 LAB — PROTIME-INR
INR: 2.8 — ABNORMAL HIGH (ref 0.8–1.2)
Prothrombin Time: 28.4 seconds — ABNORMAL HIGH (ref 11.4–15.2)

## 2020-05-16 ENCOUNTER — Other Ambulatory Visit: Payer: Medicare Other

## 2020-05-26 ENCOUNTER — Inpatient Hospital Stay: Payer: Medicare Other

## 2020-05-26 ENCOUNTER — Other Ambulatory Visit: Payer: Self-pay

## 2020-05-26 DIAGNOSIS — I82512 Chronic embolism and thrombosis of left femoral vein: Secondary | ICD-10-CM

## 2020-05-26 LAB — PROTIME-INR
INR: 2.8 — ABNORMAL HIGH (ref 0.8–1.2)
Prothrombin Time: 28.2 seconds — ABNORMAL HIGH (ref 11.4–15.2)

## 2020-06-02 ENCOUNTER — Telehealth: Payer: Self-pay | Admitting: *Deleted

## 2020-06-02 NOTE — Telephone Encounter (Signed)
Left vm for patient to return my phone call. 

## 2020-06-02 NOTE — Telephone Encounter (Signed)
-----   Message from Cammie Sickle, MD sent at 06/01/2020  8:22 PM EDT ----- H/T- please inform pt to continue current doing of coumadin; follow up as planned- GB

## 2020-06-02 NOTE — Telephone Encounter (Signed)
1637- pt returned my phone call. Results and plan of care reviewed with patient.

## 2020-06-13 ENCOUNTER — Inpatient Hospital Stay: Payer: Medicare Other | Attending: Internal Medicine

## 2020-06-13 ENCOUNTER — Telehealth: Payer: Self-pay | Admitting: Family Medicine

## 2020-06-13 ENCOUNTER — Inpatient Hospital Stay (HOSPITAL_BASED_OUTPATIENT_CLINIC_OR_DEPARTMENT_OTHER): Payer: Medicare Other | Admitting: Internal Medicine

## 2020-06-13 ENCOUNTER — Other Ambulatory Visit: Payer: Self-pay

## 2020-06-13 ENCOUNTER — Encounter: Payer: Self-pay | Admitting: Internal Medicine

## 2020-06-13 DIAGNOSIS — I82512 Chronic embolism and thrombosis of left femoral vein: Secondary | ICD-10-CM | POA: Diagnosis not present

## 2020-06-13 DIAGNOSIS — Z8249 Family history of ischemic heart disease and other diseases of the circulatory system: Secondary | ICD-10-CM | POA: Diagnosis not present

## 2020-06-13 DIAGNOSIS — Z7901 Long term (current) use of anticoagulants: Secondary | ICD-10-CM | POA: Insufficient documentation

## 2020-06-13 DIAGNOSIS — Z79899 Other long term (current) drug therapy: Secondary | ICD-10-CM | POA: Diagnosis not present

## 2020-06-13 DIAGNOSIS — N183 Chronic kidney disease, stage 3 unspecified: Secondary | ICD-10-CM | POA: Insufficient documentation

## 2020-06-13 DIAGNOSIS — C44320 Squamous cell carcinoma of skin of unspecified parts of face: Secondary | ICD-10-CM | POA: Insufficient documentation

## 2020-06-13 DIAGNOSIS — Z823 Family history of stroke: Secondary | ICD-10-CM | POA: Insufficient documentation

## 2020-06-13 DIAGNOSIS — R413 Other amnesia: Secondary | ICD-10-CM | POA: Diagnosis not present

## 2020-06-13 LAB — PROTIME-INR
INR: 2.8 — ABNORMAL HIGH (ref 0.8–1.2)
Prothrombin Time: 28.3 seconds — ABNORMAL HIGH (ref 11.4–15.2)

## 2020-06-13 NOTE — Telephone Encounter (Signed)
Lvm to make f.u apt

## 2020-06-13 NOTE — Telephone Encounter (Signed)
Needs follow up appt. ASAP. Please book with me

## 2020-06-13 NOTE — Assessment & Plan Note (Addendum)
#   Left lower extremity chronic DVT/PE [9338]-SU new thromboembolic events.  Stable on Coumadin/warfarin- on 5 mg  A day; most recent INR 2.8 [no major fluctuations noted on hedgehog inhibitor].  INR from today pending.  # Basal cell carcinoma: on Hedgehog inhibitor [Dr.Dasher]; improving.  # Memory loss- short term several months;  [family prefers Dr.Johnson.]  Informed Dr. Wynetta Emery kindly agrees to evaluate the patient next week.  #CKD-III [GFR 30s]-stable.  Dr.Lateef.   # DISPOSITION: # PT/INR every month x3 # in 4 months- MD; cbc/bmp/Pt/INR-Dr.B  Cc;

## 2020-06-13 NOTE — Progress Notes (Signed)
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  male history of chronic left lower extremity DVT on Coumadin and history of CKD-III is here for follow-up.   In the interim patient has been started on hedgehog inhibitor for the basal cell carcinoma of his face.  He has noted improvement of the left-sided facial lesion.  However complains of poor taste.  Patient continues to be in Coumadin.  Denies any new blood clots.He denies any falls.  Denies any gum bleeding or mucosal bleeding. No blood in stools or black or stools.  No abdominal pain.  Review of Systems  Constitutional: Negative for chills, diaphoresis, fever, malaise/fatigue and weight loss.  HENT: Negative for nosebleeds and sore throat.   Eyes: Negative for double vision.  Respiratory: Negative for cough, hemoptysis, sputum production, shortness of breath and wheezing.   Cardiovascular: Negative for chest pain, palpitations, orthopnea and leg swelling.  Gastrointestinal: Negative for abdominal pain, blood in stool, constipation, diarrhea, heartburn, melena, nausea and vomiting.  Genitourinary: Negative for dysuria, frequency and urgency.  Musculoskeletal: Negative for back pain and joint pain.  Skin: Negative.  Negative for itching and rash.  Neurological: Negative for dizziness, tingling, focal  weakness, weakness and headaches.  Endo/Heme/Allergies: Bruises/bleeds easily.  Psychiatric/Behavioral: Positive for memory loss. Negative for depression. The patient is not nervous/anxious and does not have insomnia.      MEDICAL HISTORY:  Past Medical History:  Diagnosis Date  . Chronic kidney disease   . Clotting disorder ([place])   . Heart murmur   . Skin cancer     SURGICAL HISTORY: Past Surgical History:  Procedure Laterality Date  . biopsy Right 08/26/2019   temple biopsy - dr.dasher   . EYE SURGERY Right    benign growth on right eye   . KNEE SURGERY Right   . MELANOMA EXCISION Bilateral    lower back     SOCIAL HISTORY: Social History   Socioeconomic History  . Marital status: Married    Spouse name: Not on file  . Number of children: Not on file  . Years of education: 13 years   . Highest education level: Some college, no degree  Occupational History  . Occupation: retired  Tobacco Use  . Smoking status: Never Smoker  . Smokeless tobacco: Never Used  Vaping Use  . Vaping Use: Never used  Substance and Sexual Activity  . Alcohol use: No  . Drug use: No  . Sexual activity: Not on file  Other Topics Concern  . Not on file  Social History Narrative   [place] 2 times a week, alk 3 times a week    Social Determinants of Radio broadcast assistant Strain:   . Difficulty of Paying Living Expenses:   Food Insecurity:   . Worried About Charity fundraiser in the Last Year:   .  Ran Out of Food in the Last Year:   Transportation Needs:   . Film/video editor (Medical):   Marland Kitchen Lack of Transportation (Non-Medical):   Physical Activity: Insufficiently Active  . Days of Exercise per Week: 3 days  . Minutes of Exercise per Session: 30 min  Stress:   . Feeling of Stress :   Social Connections:   . Frequency of Communication with Friends and Family:   . Frequency of Social Gatherings with Friends  and Family:   . Attends Religious Services:   . Active Member of Clubs or Organizations:   . Attends Archivist Meetings:   Marland Kitchen Marital Status:   Intimate Partner Violence:   . Fear of Current or Ex-Partner:   . Emotionally Abused:   Marland Kitchen Physically Abused:   . Sexually Abused:     FAMILY HISTORY: Family History  Problem Relation Age of Onset  . Stroke Mother   . Cerebral aneurysm Father     ALLERGIES:  has No Known Allergies.  MEDICATIONS:  Current Outpatient Medications  Medication Sig Dispense Refill  . amLODipine (NORVASC) 10 MG tablet Take 10 mg by mouth daily.     . Cholecalciferol (VITAMIN D3 PO) Take 1 tablet by mouth daily.    . Cyanocobalamin (RA VITAMIN B-12 TR) 1000 MCG TBCR Take 1 tablet by mouth daily. 3-4 times a week    . ELDERBERRY PO Take 1 tablet by mouth daily.    . enalapril (VASOTEC) 20 MG tablet Take 20 mg by mouth daily.     Marland Kitchen ERIVEDGE 150 MG capsule Take 150 mg by mouth daily.    . Grape Seed Extract 50 MG CAPS Take 1,300 mg by mouth daily.     Marland Kitchen warfarin (COUMADIN) 5 MG tablet Take 1 tablet (5 mg total) by mouth one time only at 6 PM. 90 tablet 1   No current facility-administered medications for this visit.      Marland Kitchen  PHYSICAL EXAMINATION: ECOG PERFORMANCE STATUS: 0 - Asymptomatic  Vitals:   06/13/20 1307  BP: 133/66  Pulse: 67  Temp: (!) 97 F (36.1 C)   Filed Weights   06/13/20 1307  Weight: 171 lb 3.2 oz (77.7 kg)    Physical Exam Constitutional:      Comments: Elderly appearing Caucasian male patient accompanied by his wife.  Is walking himself.  HENT:     Head: Normocephalic and atraumatic.     Mouth/Throat:     Pharynx: No oropharyngeal exudate.  Eyes:     Pupils: Pupils are equal, round, and reactive to light.  Cardiovascular:     Rate and Rhythm: Normal rate and regular rhythm.  Pulmonary:     Effort: Pulmonary effort is normal. No respiratory distress.     Breath sounds: Normal breath sounds. No wheezing.   Abdominal:     General: Bowel sounds are normal. There is no distension.     Palpations: Abdomen is soft. There is no mass.     Tenderness: There is no abdominal tenderness. There is no guarding or rebound.  Musculoskeletal:        General: No tenderness. Normal range of motion.     Cervical back: Normal range of motion and neck supple.  Skin:    General: Skin is warm.  Neurological:     Mental Status: He is alert and oriented to person, place, and time.  Psychiatric:        Mood and Affect: Affect normal.  LABORATORY DATA:  I have reviewed the data as listed Lab Results  Component Value Date   WBC 4.8 03/14/2020   HGB 15.6 03/14/2020   HCT 46.1 03/14/2020   MCV 96.4 03/14/2020   PLT 203 03/14/2020   Recent Labs    11/23/19 1012 02/15/20 1021 03/14/20 1337  NA 138 140 139  K 4.1 4.1 4.4  CL 110 108 110  CO2 '23 23 24  ' GLUCOSE 108* 122* 93  BUN 25* 26* 32*  CREATININE 2.00* 2.01* 2.07*  CALCIUM 9.4 10.2 9.7  GFRNONAA 29* 29* 28*  GFRAA 34* 34* 33*  PROT 7.2  --   --   ALBUMIN 3.9  --   --   AST 25  --   --   ALT 21  --   --   ALKPHOS 80  --   --   BILITOT 1.0  --   --     RADIOGRAPHIC STUDIES: I have personally reviewed the radiological images as listed and agreed with the findings in the report. No results found.  ASSESSMENT & PLAN:   Chronic deep vein thrombosis (DVT) of femoral vein of left lower extremity (HCC) # Left lower extremity chronic DVT/PE [8478]-SX new thromboembolic events.  Stable on Coumadin/warfarin- on 5 mg  A day; most recent INR 2.8 [no major fluctuations noted on hedgehog inhibitor].  INR from today pending.  # Basal cell carcinoma: on Hedgehog inhibitor [Dr.Dasher]; improving.  # Memory loss- short term several months;  [family prefers Dr.Johnson.]  Informed Dr. Wynetta Emery kindly agrees to evaluate the patient next week.  #CKD-III [GFR 30s]-stable.  Dr.Lateef.   # DISPOSITION: # PT/INR every month x3 # in 4 months- MD;  cbc/bmp/Pt/INR-Dr.B  Cc;   All questions were answered. The patient knows to call the clinic with any problems, questions or concerns.    Cammie Sickle, MD 06/13/2020 3:29 PM

## 2020-06-20 ENCOUNTER — Other Ambulatory Visit: Payer: Self-pay

## 2020-06-20 ENCOUNTER — Encounter: Payer: Self-pay | Admitting: Family Medicine

## 2020-06-20 ENCOUNTER — Ambulatory Visit: Payer: Medicare Other | Admitting: Family Medicine

## 2020-06-20 VITALS — BP 114/68 | HR 67 | Temp 98.3°F | Ht 66.06 in | Wt 170.5 lb

## 2020-06-20 DIAGNOSIS — D6869 Other thrombophilia: Secondary | ICD-10-CM

## 2020-06-20 DIAGNOSIS — I82512 Chronic embolism and thrombosis of left femoral vein: Secondary | ICD-10-CM | POA: Diagnosis not present

## 2020-06-20 DIAGNOSIS — I2782 Chronic pulmonary embolism: Secondary | ICD-10-CM | POA: Diagnosis not present

## 2020-06-20 DIAGNOSIS — I82501 Chronic embolism and thrombosis of unspecified deep veins of right lower extremity: Secondary | ICD-10-CM

## 2020-06-20 DIAGNOSIS — Z13220 Encounter for screening for lipoid disorders: Secondary | ICD-10-CM

## 2020-06-20 DIAGNOSIS — R413 Other amnesia: Secondary | ICD-10-CM | POA: Diagnosis not present

## 2020-06-20 DIAGNOSIS — I129 Hypertensive chronic kidney disease with stage 1 through stage 4 chronic kidney disease, or unspecified chronic kidney disease: Secondary | ICD-10-CM | POA: Diagnosis not present

## 2020-06-20 DIAGNOSIS — N184 Chronic kidney disease, stage 4 (severe): Secondary | ICD-10-CM

## 2020-06-20 LAB — URINALYSIS, ROUTINE W REFLEX MICROSCOPIC
Bilirubin, UA: NEGATIVE
Glucose, UA: NEGATIVE
Leukocytes,UA: NEGATIVE
Nitrite, UA: NEGATIVE
Specific Gravity, UA: 1.025 (ref 1.005–1.030)
Urobilinogen, Ur: 0.2 mg/dL (ref 0.2–1.0)
pH, UA: 5 (ref 5.0–7.5)

## 2020-06-20 LAB — MICROALBUMIN, URINE WAIVED
Creatinine, Urine Waived: 300 mg/dL (ref 10–300)
Microalb, Ur Waived: 150 mg/L — ABNORMAL HIGH (ref 0–19)

## 2020-06-20 LAB — MICROSCOPIC EXAMINATION
Bacteria, UA: NONE SEEN
WBC, UA: NONE SEEN /hpf (ref 0–5)

## 2020-06-20 NOTE — Progress Notes (Deleted)
There were no vitals taken for this visit.   Subjective:    Patient ID: Gregg Thomas, male    DOB: [birth year], 86 y.o.   MRN: [id number]  HPI: Gregg Thomas is a 86 y.o. male presenting on 06/20/2020 for comprehensive medical examination. Current medical complaints include:{Blank single:19197::"none","***"}  He currently lives with: Interim Problems from his last visit: {Blank single:19197::"yes","no"}  Depression Screen done today and results listed below:  Depression screen [facility] 2/9 09/09/2019 02/03/2019 09/04/2018 08/29/2017  Decreased Interest 0 0 0 0  Down, Depressed, Hopeless 0 0 0 0  PHQ - 2 Score 0 0 0 0  Altered sleeping - 0 - -  Tired, decreased energy - 0 - -  Change in appetite - 0 - -  Feeling bad or failure about yourself  - 0 - -  Trouble concentrating - 0 - -  Moving slowly or fidgety/restless - 0 - -  Suicidal thoughts - 0 - -  PHQ-9 Score - 0 - -  Difficult doing work/chores - Not difficult at all - -    The patient {has/does not YEMV:36122} a history of falls. I {did/did not:19850} complete a risk assessment for falls. A plan of care for falls {was/was not:19852} documented.   Past Medical History:  Past Medical History:  Diagnosis Date  . Chronic kidney disease   . Clotting disorder ([place])   . Heart murmur   . Skin cancer     Surgical History:  Past Surgical History:  Procedure Laterality Date  . biopsy Right 08/26/2019   temple biopsy - dr.dasher   . EYE SURGERY Right    benign growth on right eye   . KNEE SURGERY Right   . MELANOMA EXCISION Bilateral    lower back     Medications:  Current Outpatient Medications on File Prior to Visit  Medication Sig  . amLODipine (NORVASC) 10 MG tablet Take 10 mg by mouth daily.   . Cholecalciferol (VITAMIN D3 PO) Take 1 tablet by mouth daily.  . Cyanocobalamin (RA VITAMIN B-12 TR) 1000 MCG TBCR Take 1 tablet by mouth daily. 3-4 times a week  . ELDERBERRY PO Take 1 tablet by mouth daily.    . enalapril (VASOTEC) 20 MG tablet Take 20 mg by mouth daily.   Marland Kitchen ERIVEDGE 150 MG capsule Take 150 mg by mouth daily.  . Grape Seed Extract 50 MG CAPS Take 1,300 mg by mouth daily.   Marland Kitchen warfarin (COUMADIN) 5 MG tablet Take 1 tablet (5 mg total) by mouth one time only at 6 PM.   No current facility-administered medications on file prior to visit.    Allergies:  No Known Allergies  Social History:  Social History   Socioeconomic History  . Marital status: Married    Spouse name: Not on file  . Number of children: Not on file  . Years of education: 13 years   . Highest education level: Some college, no degree  Occupational History  . Occupation: retired  Tobacco Use  . Smoking status: Never Smoker  . Smokeless tobacco: Never Used  Vaping Use  . Vaping Use: Never used  Substance and Sexual Activity  . Alcohol use: No  . Drug use: No  . Sexual activity: Not on file  Other Topics Concern  . Not on file  Social History Narrative   [place] 2 times a week, alk 3 times a  week    Social Determinants of Health   Financial Resource Strain:   . Difficulty of Paying Living Expenses:   Food Insecurity:   . Worried About Charity fundraiser in the Last Year:   . Arboriculturist in the Last Year:   Transportation Needs:   . Film/video editor (Medical):   Marland Kitchen Lack of Transportation (Non-Medical):   Physical Activity: Insufficiently Active  . Days of Exercise per Week: 3 days  . Minutes of Exercise per Session: 30 min  Stress:   . Feeling of Stress :   Social Connections:   . Frequency of Communication with Friends and Family:   . Frequency of Social Gatherings with Friends and Family:   . Attends Religious Services:   . Active Member of Clubs or Organizations:   . Attends Archivist Meetings:   Marland Kitchen Marital Status:   Intimate Partner Violence:   . Fear of Current or Ex-Partner:   . Emotionally Abused:    Marland Kitchen Physically Abused:   . Sexually Abused:    Social History   Tobacco Use  Smoking Status Never Smoker  Smokeless Tobacco Never Used   Social History   Substance and Sexual Activity  Alcohol Use No    Family History:  Family History  Problem Relation Age of Onset  . Stroke Mother   . Cerebral aneurysm Father     Past medical history, surgical history, medications, allergies, family history and social history reviewed with patient today and changes made to appropriate areas of the chart.   Review of Systems - {ros master:310782} All other ROS negative except what is listed above and in the HPI.      Objective:    There were no vitals taken for this visit.  Wt Readings from Last 3 Encounters:  06/13/20 171 lb 3.2 oz (77.7 kg)  11/23/19 173 lb 3.2 oz (78.6 kg)  09/09/19 175 lb (79.4 kg)    Physical Exam  Results for orders placed or performed in visit on 06/13/20  Protime-INR  Result Value Ref Range   Prothrombin Time 28.3 (H) 11.4 - 15.2 seconds   INR 2.8 (H) 0.8 - 1.2      Assessment & Plan:   Problem List Items Addressed This Visit      Cardiovascular and Mediastinum   Pulmonary embolism ([place])   Relevant Orders   CBC with Differential/Platelet   Comprehensive metabolic panel   Chronic deep vein thrombosis (DVT) of femoral vein of left lower extremity (HCC)   Relevant Orders   CBC with Differential/Platelet   Comprehensive metabolic panel     Genitourinary   CKD (chronic kidney disease) stage 4, GFR 15-29 ml/min (HCC)   Relevant Orders   CBC with Differential/Platelet   Comprehensive metabolic panel   Urinalysis, Routine w reflex microscopic   Benign hypertensive renal disease   Relevant Orders   Comprehensive metabolic panel   Microalbumin, Urine Waived   TSH   Urinalysis, Routine w reflex microscopic     Hematopoietic and Hemostatic   Acquired thrombophilia ([place])    Other Visit Diagnoses    Routine general medical examination at a health  care facility    -  Primary   Screening for cholesterol level       Relevant Orders   Comprehensive metabolic panel   Lipid Panel w/o Chol/HDL Ratio       Discussed aspirin prophylaxis for myocardial infarction prevention and decision was {Blank single:19197::"it was not indicated","made  to continue ASA","made to start ASA","made to stop ASA","that we recommended ASA, and patient refused"}  LABORATORY TESTING:  Health maintenance labs ordered today as discussed above.   The natural history of prostate cancer and ongoing controversy regarding screening and potential treatment outcomes of prostate cancer has been discussed with the patient. The meaning of a false positive PSA and a false negative PSA has been discussed. He indicates understanding of the limitations of this screening test and wishes *** to proceed with screening PSA testing.   IMMUNIZATIONS:   - Tdap: Tetanus vaccination status reviewed: {tetanus status:315746}. - Influenza: {Blank single:19197::"Up to date","Administered today","Postponed to flu season","Refused","Given elsewhere"} - Pneumovax: {Blank single:19197::"Up to date","Administered today","Not applicable","Refused","Given elsewhere"} - Prevnar: {Blank single:19197::"Up to date","Administered today","Not applicable","Refused","Given elsewhere"} - HPV: {Blank single:19197::"Up to date","Administered today","Not applicable","Refused","Given elsewhere"} - Zostavax vaccine: {Blank single:19197::"Up to date","Administered today","Not applicable","Refused","Given elsewhere"}  SCREENING: - Colonoscopy: {Blank single:19197::"Up to date","Ordered today","Not applicable","Refused","Done elsewhere"}  Discussed with patient purpose of the colonoscopy is to detect colon cancer at curable precancerous or early stages   - AAA Screening: {Blank single:19197::"Up to date","Ordered today","Not applicable","Refused","Done elsewhere"}  -Hearing Test: {Blank single:19197::"Up to  date","Ordered today","Not applicable","Refused","Done elsewhere"}  -Spirometry: {Blank single:19197::"Up to date","Ordered today","Not applicable","Refused","Done elsewhere"}   PATIENT COUNSELING:    Sexuality: Discussed sexually transmitted diseases, partner selection, use of condoms, avoidance of unintended pregnancy  and contraceptive alternatives.   Advised to avoid cigarette smoking.  I discussed with the patient that most people either abstain from alcohol or drink within safe limits (<=14/week and <=4 drinks/occasion for males, <=7/weeks and <= 3 drinks/occasion for females) and that the risk for alcohol disorders and other health effects rises proportionally with the number of drinks per week and how often a drinker exceeds daily limits.  Discussed cessation/primary prevention of drug use and availability of treatment for abuse.   Diet: Encouraged to adjust caloric intake to maintain  or achieve ideal body weight, to reduce intake of dietary saturated fat and total fat, to limit sodium intake by avoiding high sodium foods and not adding table salt, and to maintain adequate dietary potassium and calcium preferably from fresh fruits, vegetables, and low-fat dairy products.    stressed the importance of regular exercise  Injury prevention: Discussed safety belts, safety helmets, smoke detector, smoking near bedding or upholstery.   Dental health: Discussed importance of regular tooth brushing, flossing, and dental visits.   Follow up plan: NEXT PREVENTATIVE PHYSICAL DUE IN 1 YEAR. No follow-ups on file.

## 2020-06-20 NOTE — Assessment & Plan Note (Signed)
Under good control on current regimen. Continue current regimen. Continue to monitor. Call with any concerns. Labs drawn today.  

## 2020-06-20 NOTE — Assessment & Plan Note (Signed)
Followed by oncology. Stable. Continue to follow. Call with any concerns. Continue coumadin.

## 2020-06-20 NOTE — Assessment & Plan Note (Signed)
Followed by oncology. Continue coumadin. Continue to monitor. Call with any concerns.

## 2020-06-20 NOTE — Assessment & Plan Note (Signed)
6CIT and MMSE normal. Will check labs to look for issue and pending results reach out to dermatology to see if medication could be causing this. Await results. Treat as needed.

## 2020-06-20 NOTE — Assessment & Plan Note (Signed)
Rechecking labs today. Await results. Call with any concerns.  

## 2020-06-20 NOTE — Progress Notes (Signed)
BP 114/68   Pulse 67   Temp 98.3 F (36.8 C)   Ht 5' 6.06" (1.678 m)   Wt 170 lb 8 oz (77.3 kg)   SpO2 94%   BMI 27.47 kg/m    Subjective:    Patient ID: Gregg Thomas, male    DOB: [birth year], 86 y.o.   MRN: [id number]  HPI: Gregg Thomas is a 86 y.o. male who presents today after being lost to follow up for over a year. He has been following with cardiology and oncology. Family has noticed an issue with his memory and were concerned.   Chief Complaint  Patient presents with  . Medication Problem    Loss of taste  . Memory Loss    Tried Prevagen    Memory loss- Was recently started on [place] from Dr. Evorn Gong to help with the basal cells. He has been having issues with side effects from the medicine. He has lost his taste with it. He has been moving a little bit slower and his memory is doing a little bit worse. He has forgotten names and addresses. This has been going on for at least the past couple of years. Had tried prevagen and has not had any improvement on that medicine. Has been forgetting people who he doesn't see very often and people that he doesn't see very often. Has had a little bit of trouble getting around without his wife in the car.   6CIT Screen 06/20/2020 09/04/2018 08/29/2017  What Year? 0 points 0 points 0 points  What month? 0 points 0 points 0 points  What time? 0 points 0 points 0 points  Count back from 20 0 points 0 points 0 points  Months in reverse 0 points 0 points 0 points  Repeat phrase 2 points 0 points 0 points  Total Score 2 0 0    MMSE - Mini Mental State Exam 06/20/2020  Orientation to time 4  Orientation to Place 5  Registration 3  Attention/ Calculation 5  Recall 1  Language- name 2 objects 2  Language- repeat 1  Language- follow 3 step command 3  Language- read & follow direction 1  Write a sentence 1  Copy design 1  Total score 27   HYPERTENSION Hypertension status: controlled  Satisfied with current  treatment? yes Duration of hypertension: chronic BP monitoring frequency:  not checking BP medication side effects:  no Medication compliance: excellent compliance Aspirin: no Recurrent headaches: no Visual changes: no Palpitations: no Dyspnea: no Chest pain: no Lower extremity edema: no Dizzy/lightheaded: no  Relevant past medical, surgical, family and social history reviewed and updated as indicated. Interim medical history since our last visit reviewed. Allergies and medications reviewed and updated.  Review of Systems  Constitutional: Positive for appetite change and chills. Negative for activity change, diaphoresis, fatigue, fever and unexpected weight change.  HENT: Negative.        Bad taste in his mouth  Respiratory: Negative.   Cardiovascular: Negative.   Gastrointestinal: Negative.   Endocrine: Positive for cold intolerance. Negative for heat intolerance, polydipsia, polyphagia and polyuria.  Musculoskeletal: Negative.   Skin: Negative.   Neurological: Positive for numbness. Negative for dizziness, tremors, seizures, syncope, facial asymmetry, speech difficulty, weakness, light-headedness and headaches.  Psychiatric/Behavioral: Positive for confusion. Negative for agitation, behavioral problems, decreased concentration, dysphoric mood, hallucinations, self-injury, sleep disturbance and suicidal ideas. The patient is not nervous/anxious and is not hyperactive.     Per HPI unless specifically indicated above  Objective:    BP 114/68   Pulse 67   Temp 98.3 F (36.8 C)   Ht 5' 6.06" (1.678 m)   Wt 170 lb 8 oz (77.3 kg)   SpO2 94%   BMI 27.47 kg/m   Wt Readings from Last 3 Encounters:  06/20/20 170 lb 8 oz (77.3 kg)  06/13/20 171 lb 3.2 oz (77.7 kg)  11/23/19 173 lb 3.2 oz (78.6 kg)    Physical Exam Vitals and nursing note reviewed.  Constitutional:      General: He is not in acute distress.    Appearance: Normal appearance. He is not ill-appearing,  toxic-appearing or diaphoretic.  HENT:     Head: Normocephalic and atraumatic.     Right Ear: External ear normal.     Left Ear: External ear normal.     Nose: Nose normal.     Mouth/Throat:     Mouth: Mucous membranes are moist.     Pharynx: Oropharynx is clear.  Eyes:     General: No scleral icterus.       Right eye: No discharge.        Left eye: No discharge.     Extraocular Movements: Extraocular movements intact.     Conjunctiva/sclera: Conjunctivae normal.     Pupils: Pupils are equal, round, and reactive to light.  Cardiovascular:     Rate and Rhythm: Normal rate and regular rhythm.     Pulses: Normal pulses.     Heart sounds: Normal heart sounds. No murmur heard.  No friction rub. No gallop.   Pulmonary:     Effort: Pulmonary effort is normal. No respiratory distress.     Breath sounds: Normal breath sounds. No stridor. No wheezing, rhonchi or rales.  Chest:     Chest wall: No tenderness.  Musculoskeletal:        General: Normal range of motion.     Cervical back: Normal range of motion and neck supple.  Skin:    General: Skin is warm and dry.     Capillary Refill: Capillary refill takes less than 2 seconds.     Coloration: Skin is not jaundiced or pale.     Findings: No bruising, erythema, lesion or rash.  Neurological:     General: No focal deficit present.     Mental Status: He is alert and oriented to person, place, and time. Mental status is at baseline.  Psychiatric:        Mood and Affect: Mood normal.        Behavior: Behavior normal.        Thought Content: Thought content normal.        Judgment: Judgment normal.     Results for orders placed or performed in visit on 06/20/20  Microscopic Examination   Urine  Result Value Ref Range   WBC, UA None seen 0 - 5 /hpf   RBC 0-2 0 - 2 /hpf   Epithelial Cells (non renal) 0-10 0 - 10 /hpf   Bacteria, UA None seen None seen/Few  Microalbumin, Urine Waived  Result Value Ref Range   Microalb, Ur Waived  150 (H) 0 - 19 mg/L   Creatinine, Urine Waived 300 10 - 300 mg/dL   Microalb/Creat Ratio 30-300 (H) <30 mg/g  Urinalysis, Routine w reflex microscopic  Result Value Ref Range   Specific Gravity, UA 1.025 1.005 - 1.030   pH, UA 5.0 5.0 - 7.5   Color, UA Yellow Yellow   Appearance Ur Clear  Clear   Leukocytes,UA Negative Negative   Protein,UA 1+ (A) Negative/Trace   Glucose, UA Negative Negative   Ketones, UA Trace (A) Negative   RBC, UA Trace (A) Negative   Bilirubin, UA Negative Negative   Urobilinogen, Ur 0.2 0.2 - 1.0 mg/dL   Nitrite, UA Negative Negative   Microscopic Examination See below:       Assessment & Plan:   Problem List Items Addressed This Visit      Cardiovascular and Mediastinum   DVT (deep venous thrombosis) ([place])    Followed by oncology. Continue coumadin. Continue to monitor. Call with any concerns.       Relevant Orders   CBC with Differential/Platelet   Comprehensive metabolic panel   Pulmonary embolism ([place])    Followed by oncology. Continue coumadin. Continue to monitor. Call with any concerns.       Relevant Orders   CBC with Differential/Platelet   Comprehensive metabolic panel   Chronic deep vein thrombosis (DVT) of femoral vein of left lower extremity ([place])    Followed by oncology. Continue coumadin. Continue to monitor. Call with any concerns.       Relevant Orders   CBC with Differential/Platelet   Comprehensive metabolic panel     Genitourinary   CKD (chronic kidney disease) stage 4, GFR 15-29 ml/min (HCC)    Rechecking labs today. Await results. Call with any concerns.       Relevant Orders   CBC with Differential/Platelet   Comprehensive metabolic panel   Urinalysis, Routine w reflex microscopic (Completed)   Benign hypertensive renal disease    Under good control on current regimen. Continue current regimen. Continue to monitor. Call with any concerns. Labs drawn today.       Relevant Orders   Comprehensive metabolic panel     Microalbumin, Urine Waived (Completed)   TSH   Urinalysis, Routine w reflex microscopic (Completed)     Hematopoietic and Hemostatic   Acquired thrombophilia ([place])    Followed by oncology. Stable. Continue to follow. Call with any concerns. Continue coumadin.        Other   Memory loss - Primary    6CIT and MMSE normal. Will check labs to look for issue and pending results reach out to dermatology to see if medication could be causing this. Await results. Treat as needed.       Relevant Orders   B12 and Folate Panel    Other Visit Diagnoses    Screening for cholesterol level       Labs drawn today. Await results.    Relevant Orders   Comprehensive metabolic panel   Lipid Panel w/o Chol/HDL Ratio       Follow up plan: Return September, wellness and physical.

## 2020-06-21 LAB — COMPREHENSIVE METABOLIC PANEL
ALT: 30 IU/L (ref 0–44)
AST: 22 IU/L (ref 0–40)
Albumin/Globulin Ratio: 1.4 (ref 1.2–2.2)
Albumin: 4 g/dL (ref 3.6–4.6)
Alkaline Phosphatase: 116 IU/L (ref 48–121)
BUN/Creatinine Ratio: 12 (ref 10–24)
BUN: 28 mg/dL — ABNORMAL HIGH (ref 8–27)
Bilirubin Total: 0.7 mg/dL (ref 0.0–1.2)
CO2: 22 mmol/L (ref 20–29)
Calcium: 11.1 mg/dL — ABNORMAL HIGH (ref 8.6–10.2)
Chloride: 106 mmol/L (ref 96–106)
Creatinine, Ser: 2.28 mg/dL — ABNORMAL HIGH (ref 0.76–1.27)
GFR calc Af Amer: 29 mL/min/{1.73_m2} — ABNORMAL LOW (ref >59)
GFR calc non Af Amer: 25 mL/min/{1.73_m2} — ABNORMAL LOW (ref >59)
Globulin, Total: 2.9 g/dL (ref 1.5–4.5)
Glucose: 92 mg/dL (ref 65–99)
Potassium: 5.1 mmol/L (ref 3.5–5.2)
Sodium: 141 mmol/L (ref 134–144)
Total Protein: 6.9 g/dL (ref 6.0–8.5)

## 2020-06-21 LAB — CBC WITH DIFFERENTIAL/PLATELET
Basophils Absolute: 0 10*3/uL (ref 0.0–0.2)
Basos: 1 %
EOS (ABSOLUTE): 0.2 10*3/uL (ref 0.0–0.4)
Eos: 4 %
Hematocrit: 43.9 % (ref 37.5–51.0)
Hemoglobin: 14.9 g/dL (ref 13.0–17.7)
Immature Grans (Abs): 0 10*3/uL (ref 0.0–0.1)
Immature Granulocytes: 0 %
Lymphocytes Absolute: 1.7 10*3/uL (ref 0.7–3.1)
Lymphs: 28 %
MCH: 32.9 pg (ref 26.6–33.0)
MCHC: 33.9 g/dL (ref 31.5–35.7)
MCV: 97 fL (ref 79–97)
Monocytes Absolute: 0.7 10*3/uL (ref 0.1–0.9)
Monocytes: 12 %
Neutrophils Absolute: 3.4 10*3/uL (ref 1.4–7.0)
Neutrophils: 55 %
Platelets: 202 10*3/uL (ref 150–450)
RBC: 4.53 x10E6/uL (ref 4.14–5.80)
RDW: 13.9 % (ref 11.6–15.4)
WBC: 6.1 10*3/uL (ref 3.4–10.8)

## 2020-06-21 LAB — LIPID PANEL W/O CHOL/HDL RATIO
Cholesterol, Total: 173 mg/dL (ref 100–199)
HDL: 43 mg/dL (ref >39)
LDL Chol Calc (NIH): 104 mg/dL — ABNORMAL HIGH (ref 0–99)
Triglycerides: 149 mg/dL (ref 0–149)
VLDL Cholesterol Cal: 26 mg/dL (ref 5–40)

## 2020-06-21 LAB — B12 AND FOLATE PANEL
Folate: 10.2 ng/mL (ref >3.0)
Vitamin B-12: 409 pg/mL (ref 232–1245)

## 2020-06-21 LAB — TSH: TSH: 2.26 u[IU]/mL (ref 0.450–4.500)

## 2020-06-24 ENCOUNTER — Telehealth: Payer: Self-pay | Admitting: Family Medicine

## 2020-06-24 ENCOUNTER — Encounter: Payer: Self-pay | Admitting: Family Medicine

## 2020-06-24 NOTE — Telephone Encounter (Signed)
I do not see a result note on these, have they been reviewed yet?

## 2020-06-24 NOTE — Telephone Encounter (Signed)
Copied from [place] [id number]. Topic: General - Inquiry >> Jun 24, 2020  9:55 AM Percell Belt A wrote: Reason for CRM: pt wife called and stated she would like someone to call her back with pt results from 6/21.  She stated she has not heard anything as of yet  Best number - [phone]

## 2020-07-11 ENCOUNTER — Other Ambulatory Visit: Payer: Self-pay

## 2020-07-11 ENCOUNTER — Inpatient Hospital Stay: Payer: Medicare Other | Attending: Hematology and Oncology

## 2020-07-11 DIAGNOSIS — I82512 Chronic embolism and thrombosis of left femoral vein: Secondary | ICD-10-CM | POA: Insufficient documentation

## 2020-07-11 LAB — PROTIME-INR
INR: 2.4 — ABNORMAL HIGH (ref 0.8–1.2)
Prothrombin Time: 25.5 seconds — ABNORMAL HIGH (ref 11.4–15.2)

## 2020-07-12 NOTE — Progress Notes (Signed)
H- please inform pt that INR is at goal. Continue current dose of coumadin. Thx, GB

## 2020-07-13 ENCOUNTER — Telehealth: Payer: Self-pay | Admitting: *Deleted

## 2020-07-13 NOTE — Telephone Encounter (Signed)
Spoke with patient. Results of inr provided. Patient instructed to continue taking his current dosing of coumadin as directed.

## 2020-07-13 NOTE — Telephone Encounter (Signed)
-----   Message from Cammie Sickle, MD sent at 07/12/2020  8:13 AM EDT ----- H- please inform pt that INR is at goal. Continue current dose of coumadin. Thx, GB

## 2020-08-08 ENCOUNTER — Inpatient Hospital Stay: Payer: Medicare Other | Attending: Hematology and Oncology

## 2020-08-08 ENCOUNTER — Other Ambulatory Visit: Payer: Self-pay

## 2020-08-08 DIAGNOSIS — I82512 Chronic embolism and thrombosis of left femoral vein: Secondary | ICD-10-CM | POA: Insufficient documentation

## 2020-08-08 LAB — PROTIME-INR
INR: 4.1 (ref 0.8–1.2)
Prothrombin Time: 38.6 seconds — ABNORMAL HIGH (ref 11.4–15.2)

## 2020-08-09 ENCOUNTER — Telehealth: Payer: Self-pay | Admitting: *Deleted

## 2020-08-09 NOTE — Telephone Encounter (Signed)
Patient returned my phone call. Dr. Rogue Bussing also spoke with him. Patient instructed to hold coumadin today and tomorrow. Will recheck his INR on Friday 08/12/20 at 1:30 pm. Lab orders-pt/inr placed.

## 2020-08-09 NOTE — Telephone Encounter (Signed)
Per Dr. Gildardo Griffes - please inform pt that his INR is elevated at 4.1; ask to hold coumadin for tonite and tomorow; and then repeat INR on 8/13; friday- please order- further instructions to follow- GB

## 2020-08-09 NOTE — Telephone Encounter (Signed)
Left vm for patient to hold his coumadin tonight and tomorrow. I requested the patient call me back as soon as possible to confirm that he had received this message.

## 2020-08-10 ENCOUNTER — Other Ambulatory Visit: Payer: Self-pay | Admitting: *Deleted

## 2020-08-10 DIAGNOSIS — I82512 Chronic embolism and thrombosis of left femoral vein: Secondary | ICD-10-CM

## 2020-08-11 ENCOUNTER — Encounter: Payer: Self-pay | Admitting: Nurse Practitioner

## 2020-08-11 ENCOUNTER — Other Ambulatory Visit: Payer: Self-pay

## 2020-08-11 ENCOUNTER — Ambulatory Visit (INDEPENDENT_AMBULATORY_CARE_PROVIDER_SITE_OTHER): Payer: Medicare Other | Admitting: Nurse Practitioner

## 2020-08-11 DIAGNOSIS — J069 Acute upper respiratory infection, unspecified: Secondary | ICD-10-CM

## 2020-08-11 DIAGNOSIS — R059 Cough, unspecified: Secondary | ICD-10-CM | POA: Insufficient documentation

## 2020-08-11 DIAGNOSIS — R05 Cough: Secondary | ICD-10-CM

## 2020-08-11 MED ORDER — CETIRIZINE HCL 5 MG PO TABS: 5.0000 mg | ORAL_TABLET | Freq: Every day | ORAL | 1 refills | Status: DC

## 2020-08-11 MED ORDER — FLUTICASONE PROPIONATE 50 MCG/ACT NA SUSP
2.0000 | Freq: Every day | NASAL | 6 refills | Status: DC
Start: 2020-08-11 — End: 2023-04-29

## 2020-08-11 NOTE — Progress Notes (Addendum)
There were no vitals taken for this visit.   Subjective:    Patient ID: Gregg Thomas, male    DOB: 1931-03-26, 86 y.o.   MRN: [id number]  HPI: Gregg Thomas is a 86 y.o. male presenting with upper respiratory symptoms  Chief Complaint  Patient presents with  . Cough    Ongoing a few days. Wife states deep, dry cough. Cough is constant.   . Sinusitis  . Fatigue   UPPER RESPIRATORY TRACT INFECTION Patient's wife is concerned about pneumonia Onset: ~ 1 week Worst symptom: congestion Fever: no Cough: yes, dry Shortness of breath: no  Wheezing: yes Chest pain: no Chest tightness: no Chest congestion: yes Nasal congestion: yes Runny nose: yes Post nasal drip: no Sneezing: yes Sore throat: no Swollen glands: no Sinus pressure: no Headache: no Face pain: no Toothache: no Ear pain: no  Ear pressure: no  Eyes red/itching:no Eye drainage/crusting: no  Nausea: no Vomiting: no  Decreased appetite: no Rash: no Fatigue: yes Sick contacts: no Strep contacts: no  Context: stable Recurrent sinusitis: no Relief with OTC cold/cough medications: no  Treatments attempted: Mucinex, Mucinex cough syrup,   No Known Allergies  Outpatient Encounter Medications as of 08/11/2020  Medication Sig Note  . amLODipine (NORVASC) 10 MG tablet Take 10 mg by mouth daily.  07/08/2015: Received from: [facility]  . Cholecalciferol (VITAMIN D3 PO) Take 1 tablet by mouth daily.   . Cyanocobalamin (RA VITAMIN B-12 TR) 1000 MCG TBCR Take 1 tablet by mouth daily. 3-4 times a week 07/08/2015: Received from: [facility]  . ELDERBERRY PO Take 1 tablet by mouth daily.   . enalapril (VASOTEC) 20 MG tablet Take 20 mg by mouth daily.  07/08/2015: Received from: [facility]  . ERIVEDGE 150 MG capsule Take 150 mg by mouth daily.   . Grape Seed Extract 50 MG CAPS Take 1,300 mg by mouth daily.  07/08/2015: Received from: [facility]  . warfarin (COUMADIN) 5 MG tablet Take 1 tablet (5 mg total) by mouth one time only at 6 PM.   . cetirizine (ZYRTEC) 5 MG tablet Take 1 tablet (5 mg total) by mouth daily.   . fluticasone (FLONASE) 50 MCG/ACT nasal spray Place 2 sprays into both nostrils daily.    No facility-administered encounter medications on file as of 08/11/2020.   Patient Active Problem List   Diagnosis Date Noted  . Cough 08/11/2020  . Acquired thrombophilia ([place]) 06/20/2020  . Memory loss 06/20/2020  . Chronic deep vein thrombosis (DVT) of femoral vein of left lower extremity ([place]) 11/23/2019  . Benign hypertensive renal disease 02/03/2019  . Advanced care planning/counseling discussion 02/03/2019  . Hearing loss 02/03/2019  . Squamous cell carcinoma, face 10/22/2018  . CKD (chronic kidney disease) stage 4, GFR 15-29 ml/min ([facility]) 10/22/2018  . Chronic anticoagulation 08/22/2017  . DVT (deep venous thrombosis) ([place]) 07/08/2015  . Pulmonary embolism ([place]) 07/08/2015   Past Medical History:  Diagnosis Date  . Chronic kidney disease   . Clotting disorder ([place])   . Heart murmur   . Skin cancer    Relevant past medical, surgical, family and social history reviewed and updated as indicated. Interim medical history since our last visit reviewed.  Review of Systems  Constitutional: Positive for fatigue. Negative for activity change, appetite change, chills and fever.  HENT: Positive for congestion, rhinorrhea and sneezing. Negative for ear discharge, ear pain, facial swelling, postnasal drip, sinus pressure,  sinus pain and sore throat.   Eyes: Negative.  Negative for pain, discharge, redness and itching.  Respiratory: Positive for cough and wheezing. Negative for chest tightness and shortness of breath.   Cardiovascular: Negative.  Negative for chest pain and palpitations.  Gastrointestinal: Negative.  Negative for nausea and vomiting.  Skin: Negative.  Negative for rash.  Neurological: Negative.   Negative for headaches.  Hematological: Negative.  Negative for adenopathy.  Psychiatric/Behavioral: Negative.    Per HPI unless specifically indicated above     Objective:    There were no vitals taken for this visit.  Wt Readings from Last 3 Encounters:  06/20/20 170 lb 8 oz (77.3 kg)  06/13/20 171 lb 3.2 oz (77.7 kg)  11/23/19 173 lb 3.2 oz (78.6 kg)    Physical Exam Physical examination unable to be performed due to lack of equipment.  Results for orders placed or performed in visit on 08/08/20  Protime-INR  Result Value Ref Range   Prothrombin Time 38.6 (H) 11.4 - 15.2 seconds   INR 4.1 (HH) 0.8 - 1.2      Assessment & Plan:   Problem List Items Addressed This Visit      Other   Cough    Acute, ongoing.  History consistent with acute upper viral infection and/or allergic rhinitis.  Will obtain chest x-ray given concerns for ongoing non-productive cough and age, cannot rule out pneumonia.  Will also start oral antihistamine and intranasal corticosteroid for possible allergic rhinitis.  Appears to be fully vaccinated for COVID, but will obtain testing for today.  Continue to wear mask, push fluids, treat symptoms, and rest.  If symptoms not better early next week, return to clinic.  With any sudden onset of chest pain or shortness of breath, go to ER.      Relevant Orders   DG Chest 2 View    Other Visit Diagnoses    Upper respiratory tract infection, unspecified type    -  Primary   Relevant Orders   DG Chest 2 View       Follow up plan: Return if symptoms worsen or fail to improve.  This visit was completed via telephone due to the restrictions of the COVID-19 pandemic. All issues as above were discussed and addressed but no physical exam was performed. If it was felt that the patient should be evaluated in the office, they were directed there. The patient verbally consented to this visit. Patient was unable to complete an audio/visual visit due to Lack of  equipment. . Location of the patient: home . Location of the provider: work . Those involved with this call:  . Provider: Carnella Guadalajara, DNP . CMA: Merilyn Baba, CMA . Front Desk/Registration: Levert Feinstein  . Time spent on call: 10 minutes on the phone discussing health concerns. 30 minutes total spent in review of patient's record and preparation of their chart.  I verified patient identity using two factors (patient name and date of birth). Patient consents verbally to being seen via telemedicine visit today.

## 2020-08-11 NOTE — Assessment & Plan Note (Signed)
Acute, ongoing.  History consistent with acute upper viral infection and/or allergic rhinitis.  Will obtain chest x-ray given concerns for ongoing non-productive cough and age, cannot rule out pneumonia.  Will also start oral antihistamine and intranasal corticosteroid for possible allergic rhinitis.  Appears to be fully vaccinated for COVID, but will obtain testing for today.  Continue to wear mask, push fluids, treat symptoms, and rest.  If symptoms not better early next week, return to clinic.  With any sudden onset of chest pain or shortness of breath, go to ER.

## 2020-08-11 NOTE — Patient Instructions (Signed)

## 2020-08-12 ENCOUNTER — Inpatient Hospital Stay: Payer: Medicare Other

## 2020-08-12 ENCOUNTER — Telehealth: Payer: Self-pay | Admitting: Family Medicine

## 2020-08-12 ENCOUNTER — Other Ambulatory Visit: Payer: Self-pay

## 2020-08-12 DIAGNOSIS — I82512 Chronic embolism and thrombosis of left femoral vein: Secondary | ICD-10-CM

## 2020-08-12 LAB — PROTIME-INR
INR: 2.7 — ABNORMAL HIGH (ref 0.8–1.2)
Prothrombin Time: 28 seconds — ABNORMAL HIGH (ref 11.4–15.2)

## 2020-08-12 NOTE — Telephone Encounter (Signed)
Copied from [place] [id number]. Topic: General - Other >> Aug 12, 2020  2:25 PM Leward Quan A wrote: Reason for CRM: Patient wife called to inquire is Chest Xray results are in as yet asking for a call back to discuss these results please Ph# [phone]

## 2020-08-12 NOTE — Telephone Encounter (Signed)
Patient notified of results.

## 2020-08-12 NOTE — Telephone Encounter (Signed)
Please let patient/wife know that chest x-ray did not show pneumonia, but did show emphysema which will require close monitoring in the future.  Please encourage them to follow up with [place] if the medications we started this week are not helping early next week.

## 2020-08-12 NOTE — Telephone Encounter (Signed)
Routing to provider. Patient had x-ray at [facility].

## 2020-08-15 ENCOUNTER — Telehealth: Payer: Self-pay | Admitting: Internal Medicine

## 2020-08-15 ENCOUNTER — Other Ambulatory Visit: Payer: Self-pay | Admitting: *Deleted

## 2020-08-15 DIAGNOSIS — I82512 Chronic embolism and thrombosis of left femoral vein: Secondary | ICD-10-CM

## 2020-08-15 NOTE — Telephone Encounter (Signed)
Spoke to patient regarding results of the INR therapeutic.  Recommend current dosing of Coumadin 5 mg a day. [Recent URI]  Schedule lab-1 week/order PT/INR.

## 2020-08-15 NOTE — Telephone Encounter (Signed)
Colette, please schedule patient for 08/19/20 for lab only

## 2020-08-19 ENCOUNTER — Telehealth: Payer: Self-pay | Admitting: Internal Medicine

## 2020-08-19 ENCOUNTER — Other Ambulatory Visit: Payer: Self-pay

## 2020-08-19 ENCOUNTER — Inpatient Hospital Stay: Payer: Medicare Other

## 2020-08-19 DIAGNOSIS — I82512 Chronic embolism and thrombosis of left femoral vein: Secondary | ICD-10-CM | POA: Diagnosis not present

## 2020-08-19 LAB — PROTIME-INR
INR: 3 — ABNORMAL HIGH (ref 0.8–1.2)
Prothrombin Time: 29.9 seconds — ABNORMAL HIGH (ref 11.4–15.2)

## 2020-08-19 NOTE — Telephone Encounter (Signed)
Spoke with patient. Instructions provided to continue taking coumadin 5 mg daily. INR is within therapeutic range.

## 2020-08-19 NOTE — Telephone Encounter (Signed)
H/T-please inform patient that his INR is at goal.  Continue taking Coumadin current dose of 5 mg/day  We will recheck PT/INR as scheduled on 9 /07.   GB

## 2020-09-06 ENCOUNTER — Inpatient Hospital Stay: Payer: Medicare Other | Attending: Hematology and Oncology

## 2020-09-06 ENCOUNTER — Other Ambulatory Visit: Payer: Self-pay

## 2020-09-06 DIAGNOSIS — Z13220 Encounter for screening for lipoid disorders: Secondary | ICD-10-CM | POA: Insufficient documentation

## 2020-09-06 DIAGNOSIS — I129 Hypertensive chronic kidney disease with stage 1 through stage 4 chronic kidney disease, or unspecified chronic kidney disease: Secondary | ICD-10-CM | POA: Diagnosis not present

## 2020-09-06 DIAGNOSIS — I2782 Chronic pulmonary embolism: Secondary | ICD-10-CM | POA: Diagnosis not present

## 2020-09-06 DIAGNOSIS — I82512 Chronic embolism and thrombosis of left femoral vein: Secondary | ICD-10-CM | POA: Diagnosis not present

## 2020-09-06 DIAGNOSIS — D6869 Other thrombophilia: Secondary | ICD-10-CM | POA: Insufficient documentation

## 2020-09-06 LAB — PROTIME-INR
INR: 2.4 — ABNORMAL HIGH (ref 0.8–1.2)
Prothrombin Time: 25.4 seconds — ABNORMAL HIGH (ref 11.4–15.2)

## 2020-09-19 ENCOUNTER — Ambulatory Visit (INDEPENDENT_AMBULATORY_CARE_PROVIDER_SITE_OTHER): Payer: Medicare Other

## 2020-09-19 VITALS — Ht 67.0 in | Wt 170.0 lb

## 2020-09-19 DIAGNOSIS — Z0000 Encounter for general adult medical examination without abnormal findings: Secondary | ICD-10-CM

## 2020-09-19 NOTE — Patient Instructions (Signed)
Gregg Thomas , Thank you for taking time to come for your Medicare Wellness Visit. I appreciate your ongoing commitment to your health goals. Please review the following plan we discussed and let me know if I can assist you in the future.   Screening recommendations/referrals: Colonoscopy: not required Recommended yearly ophthalmology/optometry visit for glaucoma screening and checkup Recommended yearly dental visit for hygiene and checkup  Vaccinations: Influenza vaccine: due Pneumococcal vaccine: completed 08/29/2017 Tdap vaccine: completed 02/01/2016 Shingles vaccine: discussed   Covid-19:  03/11/2020, 02/12/2020  Advanced directives: Please bring a copy of your POA (Power of Attorney) and/or Living Will to your next appointment.   Conditions/risks identified: none  Next appointment: Follow up in one year for your annual wellness visit.   Preventive Care 84 Years and Older, Male Preventive care refers to lifestyle choices and visits with your health care provider that can promote health and wellness. What does preventive care include?  A yearly physical exam. This is also called an annual well check.  Dental exams once or twice a year.  Routine eye exams. Ask your health care provider how often you should have your eyes checked.  Personal lifestyle choices, including:  Daily care of your teeth and gums.  Regular physical activity.  Eating a healthy diet.  Avoiding tobacco and drug use.  Limiting alcohol use.  Practicing safe sex.  Taking low doses of aspirin every day.  Taking vitamin and mineral supplements as recommended by your health care provider. What happens during an annual well check? The services and screenings done by your health care provider during your annual well check will depend on your age, overall health, lifestyle risk factors, and family history of disease. Counseling  Your health care provider may ask you questions about your:  Alcohol  use.  Tobacco use.  Drug use.  Emotional well-being.  Home and relationship well-being.  Sexual activity.  Eating habits.  History of falls.  Memory and ability to understand (cognition).  Work and work Statistician. Screening  You may have the following tests or measurements:  Height, weight, and BMI.  Blood pressure.  Lipid and cholesterol levels. These may be checked every 5 years, or more frequently if you are over 26 years old.  Skin check.  Lung cancer screening. You may have this screening every year starting at age 21 if you have a 30-pack-year history of smoking and currently smoke or have quit within the past 15 years.  Fecal occult blood test (FOBT) of the stool. You may have this test every year starting at age 84.  Flexible sigmoidoscopy or colonoscopy. You may have a sigmoidoscopy every 5 years or a colonoscopy every 10 years starting at age 40.  Prostate cancer screening. Recommendations will vary depending on your family history and other risks.  Hepatitis C blood test.  Hepatitis B blood test.  Sexually transmitted disease (STD) testing.  Diabetes screening. This is done by checking your blood sugar (glucose) after you have not eaten for a while (fasting). You may have this done every 1-3 years.  Abdominal aortic aneurysm (AAA) screening. You may need this if you are a current or former smoker.  Osteoporosis. You may be screened starting at age 96 if you are at high risk. Talk with your health care provider about your test results, treatment options, and if necessary, the need for more tests. Vaccines  Your health care provider may recommend certain vaccines, such as:  Influenza vaccine. This is recommended every year.  Tetanus,  diphtheria, and acellular pertussis (Tdap, Td) vaccine. You may need a Td booster every 10 years.  Zoster vaccine. You may need this after age 32.  Pneumococcal 13-valent conjugate (PCV13) vaccine. One dose is  recommended after age 83.  Pneumococcal polysaccharide (PPSV23) vaccine. One dose is recommended after age 62. Talk to your health care provider about which screenings and vaccines you need and how often you need them. This information is not intended to replace advice given to you by your health care provider. Make sure you discuss any questions you have with your health care provider. Document Released: 01/13/2016 Document Revised: 09/05/2016 Document Reviewed: 10/18/2015 Elsevier Interactive Patient Education  2017 [place] Prevention in the Home Falls can cause injuries. They can happen to people of all ages. There are many things you can do to make your home safe and to help prevent falls. What can I do on the outside of my home?  Regularly fix the edges of walkways and driveways and fix any cracks.  Remove anything that might make you trip as you walk through a door, such as a raised step or threshold.  Trim any bushes or trees on the path to your home.  Use bright outdoor lighting.  Clear any walking paths of anything that might make someone trip, such as rocks or tools.  Regularly check to see if handrails are loose or broken. Make sure that both sides of any steps have handrails.  Any raised decks and porches should have guardrails on the edges.  Have any leaves, snow, or ice cleared regularly.  Use sand or salt on walking paths during winter.  Clean up any spills in your garage right away. This includes oil or grease spills. What can I do in the bathroom?  Use night lights.  Install grab bars by the toilet and in the tub and shower. Do not use towel bars as grab bars.  Use non-skid mats or decals in the tub or shower.  If you need to sit down in the shower, use a plastic, non-slip stool.  Keep the floor dry. Clean up any water that spills on the floor as soon as it happens.  Remove soap buildup in the tub or shower regularly.  Attach bath mats  securely with double-sided non-slip rug tape.  Do not have throw rugs and other things on the floor that can make you trip. What can I do in the bedroom?  Use night lights.  Make sure that you have a light by your bed that is easy to reach.  Do not use any sheets or blankets that are too big for your bed. They should not hang down onto the floor.  Have a firm chair that has side arms. You can use this for support while you get dressed.  Do not have throw rugs and other things on the floor that can make you trip. What can I do in the kitchen?  Clean up any spills right away.  Avoid walking on wet floors.  Keep items that you use a lot in easy-to-reach places.  If you need to reach something above you, use a strong step stool that has a grab bar.  Keep electrical cords out of the way.  Do not use floor polish or wax that makes floors slippery. If you must use wax, use non-skid floor wax.  Do not have throw rugs and other things on the floor that can make you trip. What can I do with  my stairs?  Do not leave any items on the stairs.  Make sure that there are handrails on both sides of the stairs and use them. Fix handrails that are broken or loose. Make sure that handrails are as long as the stairways.  Check any carpeting to make sure that it is firmly attached to the stairs. Fix any carpet that is loose or worn.  Avoid having throw rugs at the top or bottom of the stairs. If you do have throw rugs, attach them to the floor with carpet tape.  Make sure that you have a light switch at the top of the stairs and the bottom of the stairs. If you do not have them, ask someone to add them for you. What else can I do to help prevent falls?  Wear shoes that:  Do not have high heels.  Have rubber bottoms.  Are comfortable and fit you well.  Are closed at the toe. Do not wear sandals.  If you use a stepladder:  Make sure that it is fully opened. Do not climb a closed  stepladder.  Make sure that both sides of the stepladder are locked into place.  Ask someone to hold it for you, if possible.  Clearly mark and make sure that you can see:  Any grab bars or handrails.  First and last steps.  Where the edge of each step is.  Use tools that help you move around (mobility aids) if they are needed. These include:  Canes.  Walkers.  Scooters.  Crutches.  Turn on the lights when you go into a dark area. Replace any light bulbs as soon as they burn out.  Set up your furniture so you have a clear path. Avoid moving your furniture around.  If any of your floors are uneven, fix them.  If there are any pets around you, be aware of where they are.  Review your medicines with your doctor. Some medicines can make you feel dizzy. This can increase your chance of falling. Ask your doctor what other things that you can do to help prevent falls. This information is not intended to replace advice given to you by your health care provider. Make sure you discuss any questions you have with your health care provider. Document Released: 10/13/2009 Document Revised: 05/24/2016 Document Reviewed: 01/21/2015 Elsevier Interactive Patient Education  2017 [employer].

## 2020-09-19 NOTE — Progress Notes (Signed)
I connected with Gregg Thomas today by telephone and verified that I am speaking with the correct person using two identifiers. Location patient: home Location provider: work Persons participating in the virtual visit: Barclay, Lennox LPN.   I discussed the limitations, risks, security and privacy concerns of performing an evaluation and management service by telephone and the availability of in person appointments. I also discussed with the patient that there may be a patient responsible charge related to this service. The patient expressed understanding and verbally consented to this telephonic visit.    Interactive audio and video telecommunications were attempted between this provider and patient, however failed, due to patient having technical difficulties OR patient did not have access to video capability.  We continued and completed visit with audio only.     Vital signs may be patient reported or missing.  Subjective:   Gregg Thomas is a 86 y.o. male who presents for Medicare Annual/Subsequent preventive examination.  Review of Systems     Cardiac Risk Factors include: advanced age (>65mn, >>23women);hypertension;male gender     Objective:    Today's Vitals   09/19/20 1341  Weight: 170 lb (77.1 kg)  Height: '5\' 7"'  (1.702 m)   Body mass index is 26.63 kg/m.  Advanced Directives 09/19/2020 02/15/2020 09/09/2019 06/08/2019 12/05/2018 09/04/2018 06/06/2018  Does Patient Have a Medical Advance Directive? Yes No Yes Yes Yes Yes Yes  Type of [place] - Living will;Healthcare Power of [place] Living will;Healthcare Power of [place]  Does patient want to make changes to medical advance directive? - - - - No - Patient declined - No - Patient declined  Copy of [place]  Chart? No - copy requested - No - copy requested No - copy requested No - copy requested No - copy requested Yes  Would patient like information on creating a medical advance directive? - No - Patient declined - - - - -    Current Medications (verified) Outpatient Encounter Medications as of 09/19/2020  Medication Sig  . amLODipine (NORVASC) 10 MG tablet Take 10 mg by mouth daily.   . cetirizine (ZYRTEC) 5 MG tablet Take 1 tablet (5 mg total) by mouth daily.  . Cholecalciferol (VITAMIN D3 PO) Take 1 tablet by mouth daily.  . Cyanocobalamin (RA VITAMIN B-12 TR) 1000 MCG TBCR Take 1 tablet by mouth daily. 3-4 times a week  . ELDERBERRY PO Take 1 tablet by mouth daily.  . enalapril (VASOTEC) 20 MG tablet Take 20 mg by mouth daily.   .Marland KitchenERIVEDGE 150 MG capsule Take 150 mg by mouth daily.  . Grape Seed Extract 50 MG CAPS Take 1,300 mg by mouth daily.   .Marland Kitchenwarfarin (COUMADIN) 5 MG tablet Take 1 tablet (5 mg total) by mouth one time only at 6 PM.  . fluticasone (FLONASE) 50 MCG/ACT nasal spray Place 2 sprays into both nostrils daily. (Patient not taking: Reported on 09/19/2020)   No facility-administered encounter medications on file as of 09/19/2020.    Allergies (verified) Patient has no known allergies.   History: Past Medical History:  Diagnosis Date  . Chronic kidney disease   . Clotting disorder ([place]   . Heart murmur   . Skin cancer    Past Surgical History:  Procedure Laterality Date  . biopsy Right 08/26/2019   temple biopsy - dr.dasher   .  EYE SURGERY Right    benign growth on right eye   . KNEE SURGERY Right   . MELANOMA EXCISION Bilateral    lower back    Family History  Problem Relation Age of Onset  . Stroke Mother   . Cerebral aneurysm Father    Social History   Socioeconomic History  . Marital status: Married    Spouse name: Not on file  . Number of children: Not on file  . Years of education: 13 years   . Highest education level: Some college, no degree    Occupational History  . Occupation: retired  Tobacco Use  . Smoking status: Never Smoker  . Smokeless tobacco: Never Used  Vaping Use  . Vaping Use: Never used  Substance and Sexual Activity  . Alcohol use: No  . Drug use: No  . Sexual activity: Not on file  Other Topics Concern  . Not on file  Social History Narrative   [place] 2 times a week, alk 3 times a week    Social Determinants of Radio broadcast assistant Strain: Low Risk   . Difficulty of Paying Living Expenses: Not hard at all  Food Insecurity: No Food Insecurity  . Worried About Charity fundraiser in the Last Year: Never true  . Ran Out of Food in the Last Year: Never true  Transportation Needs: No Transportation Needs  . Lack of Transportation (Medical): No  . Lack of Transportation (Non-Medical): No  Physical Activity: Insufficiently Active  . Days of Exercise per Week: 3 days  . Minutes of Exercise per Session: 30 min  Stress: No Stress Concern Present  . Feeling of Stress : Not at all  Social Connections:   . Frequency of Communication with Friends and Family: Not on file  . Frequency of Social Gatherings with Friends and Family: Not on file  . Attends Religious Services: Not on file  . Active Member of Clubs or Organizations: Not on file  . Attends Archivist Meetings: Not on file  . Marital Status: Not on file    Tobacco Counseling Counseling given: Not Answered   Clinical Intake:  Pre-visit preparation completed: Yes  Pain : No/denies pain     Nutritional Status: BMI 25 -29 Overweight Nutritional Risks: None Diabetes: No  How often do you need to have someone help you when you read instructions, pamphlets, or other written materials from your doctor or pharmacy?: 1 - Never What is the last grade level you completed in school?: 26yrcollege  Diabetic? no  Interpreter Needed?: No  Information entered by :: NAllen  LPN   Activities of Daily Living In your present state of health, do you have any difficulty performing the following activities: 09/19/2020  Hearing? Y  Comment hearing aides  Vision? N  Difficulty concentrating or making decisions? Y  Walking or climbing stairs? N  Dressing or bathing? N  Doing errands, shopping? N  Preparing Food and eating ? N  Using the Toilet? N  In the past six months, have you accidently leaked urine? N  Do you have problems with loss of bowel control? N  Managing your Medications? N  Managing your Finances? N  Housekeeping or managing your Housekeeping? N  Some recent data might be hidden    Patient Care Team: JValerie Roys DO as PCP - General (Family Medicine) CLequita Asal MD as  Referring Physician (Hematology and Oncology) Dagoberto Ligas, MD as Referring Physician (Internal Medicine) Merril Abbe, MD as Referring Physician (Dermatology)  Indicate any recent Medical Services you may have received from other than [facility] providers in the past year (date may be approximate).     Assessment:   This is a routine wellness examination for Gregg Thomas.  Hearing/Vision screen  Hearing Screening   '125Hz'  '250Hz'  '500Hz'  '1000Hz'  '2000Hz'  '3000Hz'  '4000Hz'  '6000Hz'  '8000Hz'   Right ear:           Left ear:           Vision Screening Comments: Regular eye exams, [facility]  Dietary issues and exercise activities discussed: Current Exercise Habits: Home exercise routine, Type of exercise: walking, Time (Minutes): 30, Frequency (Times/Week): 3, Weekly Exercise (Minutes/Week): 90  Goals    . Increase water intake     Recommend drinking at least 6-8 glasses of water a day     . Patient Stated     09/19/2020, staying alive      Depression Screen PHQ 2/9 Scores 09/19/2020 09/09/2019 02/03/2019 09/04/2018 08/29/2017  PHQ - 2 Score 0 0 0 0 0  PHQ- 9 Score - - 0 - -    Fall Risk Fall Risk  09/19/2020 09/09/2019 02/03/2019 09/04/2018 08/29/2017  Falls in the past year? 0  0 0 No No  Number falls in past yr: - - 0 - -  Injury with Fall? - - 0 - -  Risk for fall due to : Medication side effect - - - -  Follow up Falls evaluation completed;Education provided;Falls prevention discussed - - - -    Any stairs in or around the home? Yes  If so, are there any without handrails? No  Home free of loose throw rugs in walkways, pet beds, electrical cords, etc? Yes  Adequate lighting in your home to reduce risk of falls? Yes   ASSISTIVE DEVICES UTILIZED TO PREVENT FALLS:  Life alert? No  Use of a cane, walker or w/c? No  Grab bars in the bathroom? Yes  Shower chair or bench in shower? Yes  Elevated toilet seat or a handicapped toilet? No   TIMED UP AND GO:  Was the test performed? No .     Cognitive Function: MMSE - Mini Mental State Exam 06/20/2020  Orientation to time 4  Orientation to Place 5  Registration 3  Attention/ Calculation 5  Recall 1  Language- name 2 objects 2  Language- repeat 1  Language- follow 3 step command 3  Language- read & follow direction 1  Write a sentence 1  Copy design 1  Total score 27     6CIT Screen 09/19/2020 06/20/2020 09/04/2018 08/29/2017  What Year? 0 points 0 points 0 points 0 points  What month? 0 points 0 points 0 points 0 points  What time? 3 points 0 points 0 points 0 points  Count back from 20 0 points 0 points 0 points 0 points  Months in reverse 2 points 0 points 0 points 0 points  Repeat phrase 0 points 2 points 0 points 0 points  Total Score 5 2 0 0    Immunizations Immunization History  Administered Date(s) Administered  . Influenza, High Dose Seasonal PF 10/09/2016, 09/04/2018  . Influenza-Unspecified 10/24/2017, 09/08/2019  . Moderna SARS-COVID-2 Vaccination 02/12/2020, 03/11/2020  . Pneumococcal Polysaccharide-23 08/29/2017  . Td 02/01/2016    TDAP status: Up to date Flu Vaccine status: Up to date Pneumococcal vaccine status: Up to date  Covid-19 vaccine status: Completed  vaccines  Qualifies for Shingles Vaccine? Yes   Zostavax completed Yes   Shingrix Completed?: No.    Education has been provided regarding the importance of this vaccine. Patient has been advised to call insurance company to determine out of pocket expense if they have not yet received this vaccine. Advised may also receive vaccine at local pharmacy or Health Dept. Verbalized acceptance and understanding.  Screening Tests Health Maintenance  Topic Date Due  . INFLUENZA VACCINE  07/31/2020  . TETANUS/TDAP  01/31/2026  . COVID-19 Vaccine  Completed  . PNA vac Low Risk Adult  Completed    Health Maintenance  Health Maintenance Due  Topic Date Due  . INFLUENZA VACCINE  07/31/2020    Colorectal cancer screening: No longer required.   Lung Cancer Screening: (Low Dose CT Chest recommended if Age 38-80 years, 30 pack-year currently smoking OR have quit w/in 15years.) does not qualify.   Lung Cancer Screening Referral: no  Additional Screening:  Hepatitis C Screening: does not qualify;  Vision Screening: Recommended annual ophthalmology exams for early detection of glaucoma and other disorders of the eye. Is the patient up to date with their annual eye exam?  Yes  Who is the provider or what is the name of the office in which the patient attends annual eye exams? [facility] If pt is not established with a provider, would they like to be referred to a provider to establish care? No .   Dental Screening: Recommended annual dental exams for proper oral hygiene  Community Resource Referral / Chronic Care Management: CRR required this visit?  No   CCM required this visit?  No      Plan:     I have personally reviewed and noted the following in the patient's chart:   . Medical and social history . Use of alcohol, tobacco or illicit drugs  . Current medications and supplements . Functional ability and status . Nutritional status . Physical activity . Advanced  directives . List of other physicians . Hospitalizations, surgeries, and ER visits in previous 12 months . Vitals . Screenings to include cognitive, depression, and falls . Referrals and appointments  In addition, I have reviewed and discussed with patient certain preventive protocols, quality metrics, and best practice recommendations. A written personalized care plan for preventive services as well as general preventive health recommendations were provided to patient.     Kellie Simmering, LPN   02/23/4974   Nurse Notes:

## 2020-09-20 ENCOUNTER — Other Ambulatory Visit: Payer: Self-pay

## 2020-09-20 ENCOUNTER — Encounter: Payer: Self-pay | Admitting: Family Medicine

## 2020-09-20 ENCOUNTER — Ambulatory Visit (INDEPENDENT_AMBULATORY_CARE_PROVIDER_SITE_OTHER): Payer: Medicare Other | Admitting: Family Medicine

## 2020-09-20 VITALS — BP 100/62 | HR 71 | Temp 98.2°F | Ht 65.5 in | Wt 166.0 lb

## 2020-09-20 DIAGNOSIS — I2782 Chronic pulmonary embolism: Secondary | ICD-10-CM | POA: Diagnosis not present

## 2020-09-20 DIAGNOSIS — Z0000 Encounter for general adult medical examination without abnormal findings: Secondary | ICD-10-CM

## 2020-09-20 DIAGNOSIS — I129 Hypertensive chronic kidney disease with stage 1 through stage 4 chronic kidney disease, or unspecified chronic kidney disease: Secondary | ICD-10-CM | POA: Diagnosis not present

## 2020-09-20 DIAGNOSIS — Z23 Encounter for immunization: Secondary | ICD-10-CM | POA: Diagnosis not present

## 2020-09-20 DIAGNOSIS — Z13220 Encounter for screening for lipoid disorders: Secondary | ICD-10-CM

## 2020-09-20 DIAGNOSIS — D6869 Other thrombophilia: Secondary | ICD-10-CM

## 2020-09-20 LAB — URINALYSIS, ROUTINE W REFLEX MICROSCOPIC
Bilirubin, UA: NEGATIVE
Glucose, UA: NEGATIVE
Leukocytes,UA: NEGATIVE
Nitrite, UA: NEGATIVE
Specific Gravity, UA: 1.025 (ref 1.005–1.030)
Urobilinogen, Ur: 0.2 mg/dL (ref 0.2–1.0)
pH, UA: 5 (ref 5.0–7.5)

## 2020-09-20 LAB — MICROSCOPIC EXAMINATION

## 2020-09-20 NOTE — Assessment & Plan Note (Signed)
Under good control on current regimen. Continue current regimen. Continue to monitor. Call with any concerns. Refills through nephrology. Labs drawn today.  

## 2020-09-20 NOTE — Assessment & Plan Note (Signed)
Labs drawn today. Await results. Treat as needed. Continue to follow with hematology. Stable.

## 2020-09-20 NOTE — Assessment & Plan Note (Signed)
Continue to follow with hematology. Stable on coumadin. Call with any concerns.

## 2020-09-20 NOTE — Progress Notes (Signed)
BP 100/62   Pulse 71   Temp 98.2 F (36.8 C) (Oral)   Ht 5' 5.5" (1.664 m)   Wt 166 lb (75.3 kg)   SpO2 96%   BMI 27.20 kg/m    Subjective:    Patient ID: Gregg Thomas, male    DOB: 06-29-1931, 86 y.o.   MRN: [id number]  HPI: Gregg Thomas is a 86 y.o. male presenting on 09/20/2020 for comprehensive medical examination. Current medical complaints include:none  He currently lives with: wife Interim Problems from his last visit: no  Depression Screen done today and results listed below:  Depression screen [facility] 2/9 09/20/2020 09/19/2020 09/09/2019 02/03/2019 09/04/2018  Decreased Interest 0 0 0 0 0  Down, Depressed, Hopeless 0 0 0 0 0  PHQ - 2 Score 0 0 0 0 0  Altered sleeping 0 - - 0 -  Tired, decreased energy 1 - - 0 -  Change in appetite 0 - - 0 -  Feeling bad or failure about yourself  0 - - 0 -  Trouble concentrating 0 - - 0 -  Moving slowly or fidgety/restless 0 - - 0 -  Suicidal thoughts 0 - - 0 -  PHQ-9 Score 1 - - 0 -  Difficult doing work/chores Not difficult at all - - Not difficult at all -    Past Medical History:  Past Medical History:  Diagnosis Date  . Chronic kidney disease   . Clotting disorder ([facility])   . Heart murmur   . Skin cancer     Surgical History:  Past Surgical History:  Procedure Laterality Date  . biopsy Right 08/26/2019   temple biopsy - dr.dasher   . EYE SURGERY Right    benign growth on right eye   . KNEE SURGERY Right   . MELANOMA EXCISION Bilateral    lower back     Medications:  Current Outpatient Medications on File Prior to Visit  Medication Sig  . amLODipine (NORVASC) 10 MG tablet Take 10 mg by mouth daily.   . cetirizine (ZYRTEC) 5 MG tablet Take 1 tablet (5 mg total) by mouth daily.  . Cholecalciferol (VITAMIN D3 PO) Take 1 tablet by mouth daily.  . Cyanocobalamin (RA VITAMIN B-12 TR) 1000 MCG TBCR Take 1 tablet by mouth daily. 3-4 times a week  . ELDERBERRY PO Take 1 tablet by mouth daily.  . enalapril  (VASOTEC) 20 MG tablet Take 20 mg by mouth daily.   . fluticasone (FLONASE) 50 MCG/ACT nasal spray Place 2 sprays into both nostrils daily.  . Grape Seed Extract 50 MG CAPS Take 1,300 mg by mouth daily.   Marland Kitchen warfarin (COUMADIN) 5 MG tablet Take 1 tablet (5 mg total) by mouth one time only at 6 PM.  . ERIVEDGE 150 MG capsule Take 150 mg by mouth daily. (Patient not taking: Reported on 09/20/2020)   No current facility-administered medications on file prior to visit.    Allergies:  No Known Allergies  Social History:  Social History   Socioeconomic History  . Marital status: Married    Spouse name: Not on file  . Number of children: Not on file  . Years of education: 13 years   . Highest education level: Some college, no degree  Occupational History  . Occupation: retired  Tobacco Use  . Smoking status: Never Smoker  . Smokeless tobacco: Never Used  Vaping Use  . Vaping Use: Never used  Substance and Sexual Activity  . Alcohol use:  No  . Drug use: No  . Sexual activity: Not on file  Other Topics Concern  . Not on file  Social History Narrative   [place] 2 times a week, alk 3 times a week    Social Determinants of Radio broadcast assistant Strain: Low Risk   . Difficulty of Paying Living Expenses: Not hard at all  Food Insecurity: No Food Insecurity  . Worried About Charity fundraiser in the Last Year: Never true  . Ran Out of Food in the Last Year: Never true  Transportation Needs: No Transportation Needs  . Lack of Transportation (Medical): No  . Lack of Transportation (Non-Medical): No  Physical Activity: Insufficiently Active  . Days of Exercise per Week: 3 days  . Minutes of Exercise per Session: 30 min  Stress: No Stress Concern Present  . Feeling of Stress : Not at all  Social Connections:   . Frequency of Communication with Friends and Family: Not on file  . Frequency of Social Gatherings with  Friends and Family: Not on file  . Attends Religious Services: Not on file  . Active Member of Clubs or Organizations: Not on file  . Attends Archivist Meetings: Not on file  . Marital Status: Not on file  Intimate Partner Violence:   . Fear of Current or Ex-Partner: Not on file  . Emotionally Abused: Not on file  . Physically Abused: Not on file  . Sexually Abused: Not on file   Social History   Tobacco Use  Smoking Status Never Smoker  Smokeless Tobacco Never Used   Social History   Substance and Sexual Activity  Alcohol Use No    Family History:  Family History  Problem Relation Age of Onset  . Stroke Mother   . Cerebral aneurysm Father     Past medical history, surgical history, medications, allergies, family history and social history reviewed with patient today and changes made to appropriate areas of the chart.   Review of Systems  Constitutional: Negative.   HENT: Negative.   Eyes: Negative.   Respiratory: Negative.   Cardiovascular: Negative.   Gastrointestinal: Negative.   Genitourinary: Negative.   Musculoskeletal: Negative.   Skin: Negative.   Neurological: Positive for tingling (bilateral feet). Negative for dizziness, tremors, sensory change, speech change, focal weakness, seizures, loss of consciousness, weakness and headaches.  Endo/Heme/Allergies: Negative for environmental allergies and polydipsia. Bruises/bleeds easily.  Psychiatric/Behavioral: Negative.     All other ROS negative except what is listed above and in the HPI.      Objective:    BP 100/62   Pulse 71   Temp 98.2 F (36.8 C) (Oral)   Ht 5' 5.5" (1.664 m)   Wt 166 lb (75.3 kg)   SpO2 96%   BMI 27.20 kg/m   Wt Readings from Last 3 Encounters:  09/20/20 166 lb (75.3 kg)  09/19/20 170 lb (77.1 kg)  06/20/20 170 lb 8 oz (77.3 kg)    Physical Exam Vitals and nursing note reviewed.  Constitutional:      General: He is not in acute distress.    Appearance: Normal  appearance. He is normal weight. He is not ill-appearing, toxic-appearing or diaphoretic.  HENT:     Head: Normocephalic and atraumatic.     Right Ear: Tympanic membrane, ear canal and external ear normal. There is no impacted cerumen.  Left Ear: Tympanic membrane, ear canal and external ear normal. There is no impacted cerumen.     Nose: Nose normal. No congestion or rhinorrhea.     Mouth/Throat:     Mouth: Mucous membranes are moist.     Pharynx: Oropharynx is clear. No oropharyngeal exudate or posterior oropharyngeal erythema.  Eyes:     General: No scleral icterus.       Right eye: No discharge.        Left eye: No discharge.     Extraocular Movements: Extraocular movements intact.     Conjunctiva/sclera: Conjunctivae normal.     Pupils: Pupils are equal, round, and reactive to light.  Neck:     Vascular: No carotid bruit.  Cardiovascular:     Rate and Rhythm: Normal rate and regular rhythm.     Pulses: Normal pulses.     Heart sounds: No murmur heard.  No friction rub. No gallop.   Pulmonary:     Effort: Pulmonary effort is normal. No respiratory distress.     Breath sounds: Normal breath sounds. No stridor. No wheezing, rhonchi or rales.  Chest:     Chest wall: No tenderness.  Abdominal:     General: Abdomen is flat. Bowel sounds are normal. There is no distension.     Palpations: Abdomen is soft. There is no mass.     Tenderness: There is no abdominal tenderness. There is no right CVA tenderness, left CVA tenderness, guarding or rebound.     Hernia: No hernia is present.  Genitourinary:    Comments: Genital exam deferred with shared decision making Musculoskeletal:        General: No swelling, tenderness, deformity or signs of injury.     Cervical back: Normal range of motion and neck supple. No rigidity. No muscular tenderness.     Right lower leg: No edema.     Left lower leg: No edema.  Lymphadenopathy:     Cervical: No cervical adenopathy.  Skin:    General:  Skin is warm and dry.     Capillary Refill: Capillary refill takes less than 2 seconds.     Coloration: Skin is not jaundiced or pale.     Findings: No bruising, erythema, lesion or rash.  Neurological:     General: No focal deficit present.     Mental Status: He is alert and oriented to person, place, and time.     Cranial Nerves: No cranial nerve deficit.     Sensory: No sensory deficit.     Motor: No weakness.     Coordination: Coordination normal.     Gait: Gait normal.     Deep Tendon Reflexes: Reflexes normal.  Psychiatric:        Mood and Affect: Mood normal.        Behavior: Behavior normal.        Thought Content: Thought content normal.        Judgment: Judgment normal.     Results for orders placed or performed in visit on 09/06/20  Protime-INR  Result Value Ref Range   Prothrombin Time 25.4 (H) 11.4 - 15.2 seconds   INR 2.4 (H) 0.8 - 1.2      Assessment & Plan:   Problem List Items Addressed This Visit      Cardiovascular and Mediastinum   Pulmonary embolism ([place])    Continue to follow with hematology. Stable on coumadin. Call with any concerns.       Relevant Orders  Comprehensive metabolic panel     Genitourinary   Benign hypertensive renal disease    Under good control on current regimen. Continue current regimen. Continue to monitor. Call with any concerns. Refills through nephrology. Labs drawn today.        Relevant Orders   Comprehensive metabolic panel   TSH   Urinalysis, Routine w reflex microscopic     Hematopoietic and Hemostatic   Acquired thrombophilia ([place])    Labs drawn today. Await results. Treat as needed. Continue to follow with hematology. Stable.      Relevant Orders   CBC with Differential/Platelet   Comprehensive metabolic panel   TSH    Other Visit Diagnoses    Routine general medical examination at a health care facility    -  Primary   Vaccines up to date. Screening labs checked today. Continue diet and exercise.  Call with any concerns.    Screening for cholesterol level       Labs drawn today. Await results. Treat as needed.    Relevant Orders   Comprehensive metabolic panel   Lipid Panel w/o Chol/HDL Ratio   Flu vaccine need       Flu shot given today.   Relevant Orders   Flu Vaccine QUAD High Dose(Fluad) (Completed)       Discussed aspirin prophylaxis for myocardial infarction prevention and decision was it was not indicated  LABORATORY TESTING:  Health maintenance labs ordered today as discussed above.   IMMUNIZATIONS:   - Tdap: Tetanus vaccination status reviewed: last tetanus booster within 10 years. - Influenza: Administered today - Pneumovax: Up to date - Prevnar: Up to date - COVID: Up to date  SCREENING: - Colonoscopy: Not applicable  Discussed with patient purpose of the colonoscopy is to detect colon cancer at curable precancerous or early stages    PATIENT COUNSELING:    Sexuality: Discussed sexually transmitted diseases, partner selection, use of condoms, avoidance of unintended pregnancy  and contraceptive alternatives.   Advised to avoid cigarette smoking.  I discussed with the patient that most people either abstain from alcohol or drink within safe limits (<=14/week and <=4 drinks/occasion for males, <=7/weeks and <= 3 drinks/occasion for females) and that the risk for alcohol disorders and other health effects rises proportionally with the number of drinks per week and how often a drinker exceeds daily limits.  Discussed cessation/primary prevention of drug use and availability of treatment for abuse.   Diet: Encouraged to adjust caloric intake to maintain  or achieve ideal body weight, to reduce intake of dietary saturated fat and total fat, to limit sodium intake by avoiding high sodium foods and not adding table salt, and to maintain adequate dietary potassium and calcium preferably from fresh fruits, vegetables, and low-fat dairy products.    stressed the  importance of regular exercise  Injury prevention: Discussed safety belts, safety helmets, smoke detector, smoking near bedding or upholstery.   Dental health: Discussed importance of regular tooth brushing, flossing, and dental visits.   Follow up plan: NEXT PREVENTATIVE PHYSICAL DUE IN 1 YEAR. Return in about 6 months (around 03/20/2021).

## 2020-09-21 LAB — CBC WITH DIFFERENTIAL/PLATELET
Basophils Absolute: 0 10*3/uL (ref 0.0–0.2)
Basos: 1 %
EOS (ABSOLUTE): 0.2 10*3/uL (ref 0.0–0.4)
Eos: 3 %
Hematocrit: 40.9 % (ref 37.5–51.0)
Hemoglobin: 14.1 g/dL (ref 13.0–17.7)
Immature Grans (Abs): 0 10*3/uL (ref 0.0–0.1)
Immature Granulocytes: 0 %
Lymphocytes Absolute: 1.4 10*3/uL (ref 0.7–3.1)
Lymphs: 24 %
MCH: 33.1 pg — ABNORMAL HIGH (ref 26.6–33.0)
MCHC: 34.5 g/dL (ref 31.5–35.7)
MCV: 96 fL (ref 79–97)
Monocytes Absolute: 0.6 10*3/uL (ref 0.1–0.9)
Monocytes: 10 %
Neutrophils Absolute: 3.8 10*3/uL (ref 1.4–7.0)
Neutrophils: 62 %
Platelets: 220 10*3/uL (ref 150–450)
RBC: 4.26 x10E6/uL (ref 4.14–5.80)
RDW: 13 % (ref 11.6–15.4)
WBC: 6 10*3/uL (ref 3.4–10.8)

## 2020-09-21 LAB — LIPID PANEL W/O CHOL/HDL RATIO
Cholesterol, Total: 162 mg/dL (ref 100–199)
HDL: 32 mg/dL — ABNORMAL LOW (ref >39)
LDL Chol Calc (NIH): 107 mg/dL — ABNORMAL HIGH (ref 0–99)
Triglycerides: 128 mg/dL (ref 0–149)
VLDL Cholesterol Cal: 23 mg/dL (ref 5–40)

## 2020-09-21 LAB — COMPREHENSIVE METABOLIC PANEL
ALT: 15 IU/L (ref 0–44)
AST: 19 IU/L (ref 0–40)
Albumin/Globulin Ratio: 1.5 (ref 1.2–2.2)
Albumin: 4.2 g/dL (ref 3.6–4.6)
Alkaline Phosphatase: 114 IU/L (ref 44–121)
BUN/Creatinine Ratio: 12 (ref 10–24)
BUN: 28 mg/dL — ABNORMAL HIGH (ref 8–27)
Bilirubin Total: 0.8 mg/dL (ref 0.0–1.2)
CO2: 21 mmol/L (ref 20–29)
Calcium: 9.9 mg/dL (ref 8.6–10.2)
Chloride: 107 mmol/L — ABNORMAL HIGH (ref 96–106)
Creatinine, Ser: 2.37 mg/dL — ABNORMAL HIGH (ref 0.76–1.27)
GFR calc Af Amer: 27 mL/min/{1.73_m2} — ABNORMAL LOW (ref >59)
GFR calc non Af Amer: 24 mL/min/{1.73_m2} — ABNORMAL LOW (ref >59)
Globulin, Total: 2.8 g/dL (ref 1.5–4.5)
Glucose: 86 mg/dL (ref 65–99)
Potassium: 5 mmol/L (ref 3.5–5.2)
Sodium: 141 mmol/L (ref 134–144)
Total Protein: 7 g/dL (ref 6.0–8.5)

## 2020-09-21 LAB — TSH: TSH: 1.75 u[IU]/mL (ref 0.450–4.500)

## 2020-09-23 ENCOUNTER — Encounter: Payer: Self-pay | Admitting: Family Medicine

## 2020-10-10 ENCOUNTER — Ambulatory Visit: Payer: Medicare Other | Admitting: Internal Medicine

## 2020-10-10 ENCOUNTER — Other Ambulatory Visit: Payer: Medicare Other

## 2020-10-12 ENCOUNTER — Inpatient Hospital Stay: Payer: Medicare Other | Attending: Internal Medicine

## 2020-10-12 ENCOUNTER — Other Ambulatory Visit: Payer: Self-pay

## 2020-10-12 ENCOUNTER — Inpatient Hospital Stay (HOSPITAL_BASED_OUTPATIENT_CLINIC_OR_DEPARTMENT_OTHER): Payer: Medicare Other | Admitting: Internal Medicine

## 2020-10-12 DIAGNOSIS — I82512 Chronic embolism and thrombosis of left femoral vein: Secondary | ICD-10-CM

## 2020-10-12 DIAGNOSIS — Z79899 Other long term (current) drug therapy: Secondary | ICD-10-CM | POA: Diagnosis not present

## 2020-10-12 DIAGNOSIS — Z8249 Family history of ischemic heart disease and other diseases of the circulatory system: Secondary | ICD-10-CM | POA: Diagnosis not present

## 2020-10-12 DIAGNOSIS — R413 Other amnesia: Secondary | ICD-10-CM | POA: Insufficient documentation

## 2020-10-12 DIAGNOSIS — Z7901 Long term (current) use of anticoagulants: Secondary | ICD-10-CM | POA: Insufficient documentation

## 2020-10-12 DIAGNOSIS — N183 Chronic kidney disease, stage 3 unspecified: Secondary | ICD-10-CM | POA: Insufficient documentation

## 2020-10-12 DIAGNOSIS — Z823 Family history of stroke: Secondary | ICD-10-CM | POA: Diagnosis not present

## 2020-10-12 DIAGNOSIS — Z86711 Personal history of pulmonary embolism: Secondary | ICD-10-CM | POA: Diagnosis not present

## 2020-10-12 DIAGNOSIS — C44310 Basal cell carcinoma of skin of unspecified parts of face: Secondary | ICD-10-CM | POA: Insufficient documentation

## 2020-10-12 LAB — CBC WITH DIFFERENTIAL/PLATELET
Abs Immature Granulocytes: 0.01 10*3/uL (ref 0.00–0.07)
Basophils Absolute: 0 10*3/uL (ref 0.0–0.1)
Basophils Relative: 1 %
Eosinophils Absolute: 0.2 10*3/uL (ref 0.0–0.5)
Eosinophils Relative: 3 %
HCT: 40.5 % (ref 39.0–52.0)
Hemoglobin: 14.1 g/dL (ref 13.0–17.0)
Immature Granulocytes: 0 %
Lymphocytes Relative: 30 %
Lymphs Abs: 1.7 10*3/uL (ref 0.7–4.0)
MCH: 32.7 pg (ref 26.0–34.0)
MCHC: 34.8 g/dL (ref 30.0–36.0)
MCV: 94 fL (ref 80.0–100.0)
Monocytes Absolute: 0.7 10*3/uL (ref 0.1–1.0)
Monocytes Relative: 13 %
Neutro Abs: 2.9 10*3/uL (ref 1.7–7.7)
Neutrophils Relative %: 53 %
Platelets: 221 10*3/uL (ref 150–400)
RBC: 4.31 MIL/uL (ref 4.22–5.81)
RDW: 13.4 % (ref 11.5–15.5)
WBC: 5.5 10*3/uL (ref 4.0–10.5)
nRBC: 0 % (ref 0.0–0.2)

## 2020-10-12 LAB — BASIC METABOLIC PANEL
Anion gap: 6 (ref 5–15)
BUN: 24 mg/dL — ABNORMAL HIGH (ref 8–23)
CO2: 24 mmol/L (ref 22–32)
Calcium: 9 mg/dL (ref 8.9–10.3)
Chloride: 110 mmol/L (ref 98–111)
Creatinine, Ser: 2.02 mg/dL — ABNORMAL HIGH (ref 0.61–1.24)
GFR, Estimated: 29 mL/min — ABNORMAL LOW (ref >60)
Glucose, Bld: 99 mg/dL (ref 70–99)
Potassium: 4.5 mmol/L (ref 3.5–5.1)
Sodium: 140 mmol/L (ref 135–145)

## 2020-10-12 LAB — PROTIME-INR
INR: 2.6 — ABNORMAL HIGH (ref 0.8–1.2)
Prothrombin Time: 27.3 seconds — ABNORMAL HIGH (ref 11.4–15.2)

## 2020-10-12 NOTE — Progress Notes (Signed)
Gregg Thomas CONSULT NOTE  Patient Care Team: Valerie Roys, DO as PCP - General (Family Medicine) Lequita Asal, MD as Referring Physician (Hematology and Oncology) Dagoberto Ligas, MD as Referring Physician (Internal Medicine) Merril Abbe, MD as Referring Physician (Dermatology)  CHIEF COMPLAINTS/PURPOSE OF CONSULTATION: Left lower extremity DVT  #  Oncology History Overview Note  #Left lower extremity chronic DVT/PE [2011]-on Coumadin [Drs.Gittin/Corcoran]  #2021-Left facial basal cell carcinoma ERIVEDGE- [Dr.Dasher]  #CKD [Dr.Lateef]/    Squamous cell carcinoma, face  10/22/2018 Initial Diagnosis   Squamous cell carcinoma, face     HISTORY OF PRESENTING ILLNESS:  Gregg Thomas 86 y.o.  male history of chronic left lower extremity DVT on Coumadin and history of CKD-III is here for follow-up.   Patient denies any blood in stools or black or stools.  Denies any nausea vomiting.  No new blood clots.  He continues to be on hedgehog inhibitor for basal cell carcinoma of his face.  Significant improvement noted.  Complains of poor taste.  No weight loss.  Wife is concerned about ongoing short-term memory loss.  Review of Systems  Constitutional: Negative for chills, diaphoresis, fever, malaise/fatigue and weight loss.  HENT: Negative for nosebleeds and sore throat.   Eyes: Negative for double vision.  Respiratory: Negative for cough, hemoptysis, sputum production, shortness of breath and wheezing.   Cardiovascular: Negative for chest pain, palpitations, orthopnea and leg swelling.  Gastrointestinal: Negative for abdominal pain, blood in stool, constipation, diarrhea, heartburn, melena, nausea and vomiting.  Genitourinary: Negative for dysuria, frequency and urgency.  Musculoskeletal: Negative for back pain and joint pain.  Skin: Negative.  Negative for itching and rash.  Neurological: Negative for dizziness, tingling, focal weakness, weakness  and headaches.  Endo/Heme/Allergies: Bruises/bleeds easily.  Psychiatric/Behavioral: Positive for memory loss. Negative for depression. The patient is not nervous/anxious and does not have insomnia.      MEDICAL HISTORY:  Past Medical History:  Diagnosis Date  . Chronic kidney disease   . Clotting disorder ([place])   . Heart murmur   . Skin cancer     SURGICAL HISTORY: Past Surgical History:  Procedure Laterality Date  . biopsy Right 08/26/2019   temple biopsy - dr.dasher   . EYE SURGERY Right    benign growth on right eye   . KNEE SURGERY Right   . MELANOMA EXCISION Bilateral    lower back     SOCIAL HISTORY: Social History   Socioeconomic History  . Marital status: Married    Spouse name: Not on file  . Number of children: Not on file  . Years of education: 13 years   . Highest education level: Some college, no degree  Occupational History  . Occupation: retired  Tobacco Use  . Smoking status: Never Smoker  . Smokeless tobacco: Never Used  Vaping Use  . Vaping Use: Never used  Substance and Sexual Activity  . Alcohol use: No  . Drug use: No  . Sexual activity: Not on file  Other Topics Concern  . Not on file  Social History Narrative   [place] 2 times a week, alk 3 times a week    Social Determinants of Radio broadcast assistant Strain: Low Risk   . Difficulty of Paying Living Expenses: Not hard at all  Food Insecurity: No Food Insecurity  . Worried About Charity fundraiser in the Last Year:  Never true  . Ran Out of Food in the Last Year: Never true  Transportation Needs: No Transportation Needs  . Lack of Transportation (Medical): No  . Lack of Transportation (Non-Medical): No  Physical Activity: Insufficiently Active  . Days of Exercise per Week: 3 days  . Minutes of Exercise per Session: 30 min  Stress: No Stress Concern Present  . Feeling of Stress : Not at all  Social Connections:    . Frequency of Communication with Friends and Family: Not on file  . Frequency of Social Gatherings with Friends and Family: Not on file  . Attends Religious Services: Not on file  . Active Member of Clubs or Organizations: Not on file  . Attends Archivist Meetings: Not on file  . Marital Status: Not on file  Intimate Partner Violence:   . Fear of Current or Ex-Partner: Not on file  . Emotionally Abused: Not on file  . Physically Abused: Not on file  . Sexually Abused: Not on file    FAMILY HISTORY: Family History  Problem Relation Age of Onset  . Stroke Mother   . Cerebral aneurysm Father     ALLERGIES:  has No Known Allergies.  MEDICATIONS:  Current Outpatient Medications  Medication Sig Dispense Refill  . amLODipine (NORVASC) 10 MG tablet Take 10 mg by mouth daily.     . cetirizine (ZYRTEC) 5 MG tablet Take 1 tablet (5 mg total) by mouth daily. 90 tablet 1  . Cholecalciferol (VITAMIN D3 PO) Take 1 tablet by mouth daily.    . Cyanocobalamin (RA VITAMIN B-12 TR) 1000 MCG TBCR Take 1 tablet by mouth daily. 3-4 times a week    . ELDERBERRY PO Take 1 tablet by mouth daily.    . enalapril (VASOTEC) 20 MG tablet Take 20 mg by mouth daily.     Marland Kitchen ERIVEDGE 150 MG capsule Take 150 mg by mouth daily.     . fluticasone (FLONASE) 50 MCG/ACT nasal spray Place 2 sprays into both nostrils daily. 16 g 6  . Grape Seed Extract 50 MG CAPS Take 1,300 mg by mouth daily.     Marland Kitchen warfarin (COUMADIN) 5 MG tablet Take 1 tablet (5 mg total) by mouth one time only at 6 PM. 90 tablet 1   No current facility-administered medications for this visit.      Marland Kitchen  PHYSICAL EXAMINATION: ECOG PERFORMANCE STATUS: 0 - Asymptomatic  Vitals:   10/12/20 1347  BP: 109/70  Pulse: 70  Resp: 16  Temp: 98.3 F (36.8 C)  SpO2: 98%   Filed Weights   10/12/20 1347  Weight: 169 lb (76.7 kg)    Physical Exam Constitutional:      Comments: Elderly appearing Caucasian male patient accompanied by his  wife.  Is walking himself.  HENT:     Head: Normocephalic and atraumatic.     Mouth/Throat:     Pharynx: No oropharyngeal exudate.  Eyes:     Pupils: Pupils are equal, round, and reactive to light.  Cardiovascular:     Rate and Rhythm: Normal rate and regular rhythm.  Pulmonary:     Effort: Pulmonary effort is normal. No respiratory distress.     Breath sounds: Normal breath sounds. No wheezing.  Abdominal:     General: Bowel sounds are normal. There is no distension.     Palpations: Abdomen is soft. There is no mass.     Tenderness: There is no abdominal tenderness. There is no guarding or rebound.  Musculoskeletal:        General: No tenderness. Normal range of motion.     Cervical back: Normal range of motion and neck supple.  Skin:    General: Skin is warm.  Neurological:     Mental Status: He is alert and oriented to person, place, and time.  Psychiatric:        Mood and Affect: Affect normal.      LABORATORY DATA:  I have reviewed the data as listed Lab Results  Component Value Date   WBC 5.5 10/12/2020   HGB 14.1 10/12/2020   HCT 40.5 10/12/2020   MCV 94.0 10/12/2020   PLT 221 10/12/2020   Recent Labs    11/23/19 1012 02/15/20 1021 03/14/20 1337 03/14/20 1337 06/20/20 1625 09/20/20 1435 10/12/20 1314  NA 138   < > 139  --  141 141 140  K 4.1   < > 4.4   < > 5.1 5.0 4.5  CL 110   < > 110   < > 106 107* 110  CO2 23   < > 24   < > _0 GLUCOSE 108*   < > 93  --  92 86 99  BUN 25*   < > 32*  --  28* 28* 24*  CREATININE 2.00*   < > 2.07*   < > 2.28* 2.37* 2.02*  CALCIUM 9.4   < > 9.7   < > 11.1* 9.9 9.0  GFRNONAA 29*   < > 28*   < > 25* 24* 29*  GFRAA 34*   < > 33*  --  29* 27*  --   PROT 7.2  --   --   --  6.9 7.0  --   ALBUMIN 3.9  --   --   --  4.0 4.2  --   AST 25  --   --   --  22 19  --   ALT 21  --   --   --  30 15  --   ALKPHOS 80  --   --   --  116 114  --   BILITOT 1.0  --   --   --  0.7 0.8  --    < > = values in this interval not  displayed.    RADIOGRAPHIC STUDIES: I have personally reviewed the radiological images as listed and agreed with the findings in the report. No results found.  ASSESSMENT & PLAN:   Chronic deep vein thrombosis (DVT) of femoral vein of left lower extremity (HCC) # Left lower extremity chronic DVT/PE [2956]-OZ new thromboembolic events. STABLE on Coumadin/warfarin- on 5 mg  A day;  [no major fluctuations noted on hedgehog inhibitor].  INR from today is 2.8.   # Basal cell carcinoma: on Hedgehog inhibitor [Dr.Dasher];STABLE.   # Memory loss- short term: defer to speak to Dr.Meghan Ker[facility].   #CKD-III [GFR 30s]-STABLE; Dr.Lateef.   # DISPOSITION: # PT/INR every month x3 # in 6 months- MD; cbc/bmp/Pt/INR-Dr.B  Cc;   All questions were answered. The patient knows to call the clinic with any problems, questions or concerns.    Cammie Sickle, MD 10/13/2020 8:55 AM

## 2020-10-12 NOTE — Assessment & Plan Note (Addendum)
#   Left lower extremity chronic DVT/PE [0335]-LR new thromboembolic events. STABLE on Coumadin/warfarin- on 5 mg  A day;  [no major fluctuations noted on hedgehog inhibitor].  INR from today is 2.8.   # Basal cell carcinoma: on Hedgehog inhibitor [Dr.Dasher];STABLE.   # Memory loss- short term: defer to speak to Dr.Meghan [facility].   #CKD-III [GFR 30s]-STABLE; Dr.Lateef.   # DISPOSITION: # PT/INR every month x3 # in 6 months- MD; cbc/bmp/Pt/INR-Dr.B  Cc;

## 2020-10-12 NOTE — Progress Notes (Signed)
Patient has bilateral ankle swelling that he states has been going on a couple of weeks.

## 2020-11-09 ENCOUNTER — Other Ambulatory Visit: Payer: Self-pay

## 2020-11-09 ENCOUNTER — Inpatient Hospital Stay: Payer: Medicare Other | Attending: Hematology and Oncology

## 2020-11-09 DIAGNOSIS — I82512 Chronic embolism and thrombosis of left femoral vein: Secondary | ICD-10-CM | POA: Diagnosis not present

## 2020-11-09 LAB — PROTIME-INR
INR: 2.8 — ABNORMAL HIGH (ref 0.8–1.2)
Prothrombin Time: 28.8 seconds — ABNORMAL HIGH (ref 11.4–15.2)

## 2020-12-07 ENCOUNTER — Other Ambulatory Visit: Payer: Self-pay

## 2020-12-07 ENCOUNTER — Inpatient Hospital Stay: Payer: Medicare Other | Attending: Internal Medicine

## 2020-12-07 DIAGNOSIS — I82512 Chronic embolism and thrombosis of left femoral vein: Secondary | ICD-10-CM | POA: Diagnosis not present

## 2020-12-07 LAB — PROTIME-INR
INR: 2.8 — ABNORMAL HIGH (ref 0.8–1.2)
Prothrombin Time: 28.4 seconds — ABNORMAL HIGH (ref 11.4–15.2)

## 2021-01-04 ENCOUNTER — Inpatient Hospital Stay: Payer: Medicare Other | Attending: Hematology and Oncology

## 2021-01-04 DIAGNOSIS — I82512 Chronic embolism and thrombosis of left femoral vein: Secondary | ICD-10-CM | POA: Insufficient documentation

## 2021-01-04 LAB — PROTIME-INR
INR: 2.6 — ABNORMAL HIGH (ref 0.8–1.2)
Prothrombin Time: 27.2 seconds — ABNORMAL HIGH (ref 11.4–15.2)

## 2021-01-09 ENCOUNTER — Other Ambulatory Visit: Payer: Self-pay | Admitting: Internal Medicine

## 2021-03-20 ENCOUNTER — Other Ambulatory Visit: Payer: Self-pay

## 2021-03-20 ENCOUNTER — Encounter: Payer: Self-pay | Admitting: Family Medicine

## 2021-03-20 ENCOUNTER — Ambulatory Visit: Payer: Medicare Other | Admitting: Family Medicine

## 2021-03-20 VITALS — BP 103/61 | HR 73 | Temp 97.8°F | Wt 166.2 lb

## 2021-03-20 DIAGNOSIS — D6869 Other thrombophilia: Secondary | ICD-10-CM | POA: Diagnosis not present

## 2021-03-20 DIAGNOSIS — I2782 Chronic pulmonary embolism: Secondary | ICD-10-CM | POA: Diagnosis not present

## 2021-03-20 DIAGNOSIS — I82512 Chronic embolism and thrombosis of left femoral vein: Secondary | ICD-10-CM | POA: Diagnosis not present

## 2021-03-20 DIAGNOSIS — I129 Hypertensive chronic kidney disease with stage 1 through stage 4 chronic kidney disease, or unspecified chronic kidney disease: Secondary | ICD-10-CM | POA: Diagnosis not present

## 2021-03-20 DIAGNOSIS — N184 Chronic kidney disease, stage 4 (severe): Secondary | ICD-10-CM

## 2021-03-20 DIAGNOSIS — R413 Other amnesia: Secondary | ICD-10-CM

## 2021-03-20 MED ORDER — DONEPEZIL HCL 5 MG PO TABS: 5.0000 mg | ORAL_TABLET | Freq: Every day | ORAL | 3 refills | Status: DC

## 2021-03-20 MED ORDER — AMLODIPINE BESYLATE 5 MG PO TABS
5.0000 mg | ORAL_TABLET | Freq: Every day | ORAL | 1 refills | Status: DC
Start: 2021-03-20 — End: 2021-10-10

## 2021-03-20 NOTE — Assessment & Plan Note (Signed)
Managed by hematology. Checking CBC today. Await results. Treat as needed.

## 2021-03-20 NOTE — Assessment & Plan Note (Signed)
Follows with nephrology. Call with any concerns. Continue to monitor. Labs drawn today.

## 2021-03-20 NOTE — Progress Notes (Signed)
BP 103/61   Pulse 73   Temp 97.8 F (36.6 C)   Wt 166 lb 3.2 oz (75.4 kg)   SpO2 96%   BMI 27.24 kg/m    Subjective:    Patient ID: Gregg Thomas, male    DOB: 1931-08-08, 87 y.o.   MRN: [id number]  HPI: Gregg Thomas is a 87 y.o. male  Chief Complaint  Patient presents with  . Hypertension  . short term memory    Patient wife states he is still having issues with his short term memory, states it has been ongoing for a year or more. Sometimes it is worse than other days.    HYPERTENSION Hypertension status: running low today  Satisfied with current treatment? yes Duration of hypertension: chronic BP monitoring frequency:  a few times a week BP range: 120s/70s BP medication side effects:  no Medication compliance: excellent compliance Previous BP meds:amlodipine, enlalapril Aspirin: no Recurrent headaches: no Visual changes: no Palpitations: no Dyspnea: no Chest pain: no Lower extremity edema: no Dizzy/lightheaded: no  Notes that his memory is getting worse. Having more issues remembering short term things. Would like to start some medicine. No other concerns.   Relevant past medical, surgical, family and social history reviewed and updated as indicated. Interim medical history since our last visit reviewed. Allergies and medications reviewed and updated.  Review of Systems  Constitutional: Negative.   Respiratory: Negative.   Cardiovascular: Negative.   Gastrointestinal: Negative.   Musculoskeletal: Negative.   Psychiatric/Behavioral: Positive for decreased concentration. Negative for agitation, behavioral problems, confusion, dysphoric mood, hallucinations, self-injury, sleep disturbance and suicidal ideas. The patient is not nervous/anxious and is not hyperactive.     Per HPI unless specifically indicated above     Objective:    BP 103/61   Pulse 73   Temp 97.8 F (36.6 C)   Wt 166 lb 3.2 oz (75.4 kg)   SpO2 96%   BMI 27.24  kg/m   Wt Readings from Last 3 Encounters:  03/20/21 166 lb 3.2 oz (75.4 kg)  10/12/20 169 lb (76.7 kg)  09/20/20 166 lb (75.3 kg)   Vitals with BMI 03/20/2021 10/12/2020 09/20/2020  Height - 5' 5.5" 5' 5.5"  Weight 166 lbs 3 oz 169 lbs 166 lbs  BMI - 62.83 66.29  Systolic 476 546 503  Diastolic 61 70 62  Pulse 73 70 71      Physical Exam Vitals and nursing note reviewed.  Constitutional:      General: He is not in acute distress.    Appearance: Normal appearance. He is not ill-appearing, toxic-appearing or diaphoretic.  HENT:     Head: Normocephalic and atraumatic.     Right Ear: External ear normal.     Left Ear: External ear normal.     Nose: Nose normal.     Mouth/Throat:     Mouth: Mucous membranes are moist.     Pharynx: Oropharynx is clear.  Eyes:     General: No scleral icterus.       Right eye: No discharge.        Left eye: No discharge.     Extraocular Movements: Extraocular movements intact.     Conjunctiva/sclera: Conjunctivae normal.     Pupils: Pupils are equal, round, and reactive to light.  Cardiovascular:     Rate and Rhythm: Normal rate and regular rhythm.     Pulses: Normal pulses.     Heart sounds: Normal heart sounds. No murmur heard. No  friction rub. No gallop.   Pulmonary:     Effort: Pulmonary effort is normal. No respiratory distress.     Breath sounds: Normal breath sounds. No stridor. No wheezing, rhonchi or rales.  Chest:     Chest wall: No tenderness.  Musculoskeletal:        General: Normal range of motion.     Cervical back: Normal range of motion and neck supple.  Skin:    General: Skin is warm and dry.     Capillary Refill: Capillary refill takes less than 2 seconds.     Coloration: Skin is not jaundiced or pale.     Findings: No bruising, erythema, lesion or rash.  Neurological:     General: No focal deficit present.     Mental Status: He is alert and oriented to person, place, and time. Mental status is at baseline.   Psychiatric:        Mood and Affect: Mood normal.        Behavior: Behavior normal.        Thought Content: Thought content normal.        Judgment: Judgment normal.     Results for orders placed or performed in visit on 01/04/21  Protime-INR  Result Value Ref Range   Prothrombin Time 27.2 (H) 11.4 - 15.2 seconds   INR 2.6 (H) 0.8 - 1.2      Assessment & Plan:   Problem List Items Addressed This Visit      Cardiovascular and Mediastinum   Pulmonary embolism ([place])    Managed by hematology. Checking CBC today. Await results. Treat as needed.       Chronic deep vein thrombosis (DVT) of femoral vein of left lower extremity ([place])    Managed by hematology. Checking CBC today. Await results. Treat as needed.         Genitourinary   CKD (chronic kidney disease) stage 4, GFR 15-29 ml/min (HCC)    Follows with nephrology. Call with any concerns. Continue to monitor. Labs drawn today.      Benign hypertensive renal disease - Primary    Over treated with BP dropping on standing. Will cut his amlodipine in 1/2 and recheck 1 month. Call with any concerns. Refills given. Labs drawn today.      Relevant Orders   Basic metabolic panel     Hematopoietic and Hemostatic   Acquired thrombophilia ([place])    Managed by hematology. Checking CBC today. Await results. Treat as needed.       Relevant Orders   CBC with Differential/Platelet     Other   Memory loss    Would like to start aricept to slow down issues. Recheck 3 months. Call with any concerns.           Follow up plan: Return in about 4 weeks (around 04/17/2021).

## 2021-03-20 NOTE — Assessment & Plan Note (Signed)
Would like to start aricept to slow down issues. Recheck 3 months. Call with any concerns.

## 2021-03-20 NOTE — Assessment & Plan Note (Signed)
Over treated with BP dropping on standing. Will cut his amlodipine in 1/2 and recheck 1 month. Call with any concerns. Refills given. Labs drawn today.

## 2021-03-21 LAB — CBC WITH DIFFERENTIAL/PLATELET
Basophils Absolute: 0.1 10*3/uL (ref 0.0–0.2)
Basos: 1 %
EOS (ABSOLUTE): 0.1 10*3/uL (ref 0.0–0.4)
Eos: 3 %
Hematocrit: 45.1 % (ref 37.5–51.0)
Hemoglobin: 15.1 g/dL (ref 13.0–17.7)
Immature Grans (Abs): 0 10*3/uL (ref 0.0–0.1)
Immature Granulocytes: 0 %
Lymphocytes Absolute: 1.2 10*3/uL (ref 0.7–3.1)
Lymphs: 23 %
MCH: 32.5 pg (ref 26.6–33.0)
MCHC: 33.5 g/dL (ref 31.5–35.7)
MCV: 97 fL (ref 79–97)
Monocytes Absolute: 0.6 10*3/uL (ref 0.1–0.9)
Monocytes: 12 %
Neutrophils Absolute: 3.2 10*3/uL (ref 1.4–7.0)
Neutrophils: 61 %
Platelets: 229 10*3/uL (ref 150–450)
RBC: 4.65 x10E6/uL (ref 4.14–5.80)
RDW: 13.2 % (ref 11.6–15.4)
WBC: 5.3 10*3/uL (ref 3.4–10.8)

## 2021-03-21 LAB — BASIC METABOLIC PANEL
BUN/Creatinine Ratio: 11 (ref 10–24)
BUN: 23 mg/dL (ref 8–27)
CO2: 21 mmol/L (ref 20–29)
Calcium: 9.7 mg/dL (ref 8.6–10.2)
Chloride: 107 mmol/L — ABNORMAL HIGH (ref 96–106)
Creatinine, Ser: 2.19 mg/dL — ABNORMAL HIGH (ref 0.76–1.27)
Glucose: 80 mg/dL (ref 65–99)
Potassium: 5.3 mmol/L — ABNORMAL HIGH (ref 3.5–5.2)
Sodium: 143 mmol/L (ref 134–144)
eGFR: 28 mL/min/{1.73_m2} — ABNORMAL LOW (ref >59)

## 2021-03-27 ENCOUNTER — Other Ambulatory Visit: Payer: Self-pay | Admitting: Family Medicine

## 2021-03-27 DIAGNOSIS — N289 Disorder of kidney and ureter, unspecified: Secondary | ICD-10-CM

## 2021-04-12 ENCOUNTER — Inpatient Hospital Stay: Payer: Medicare Other | Admitting: Internal Medicine

## 2021-04-12 ENCOUNTER — Inpatient Hospital Stay: Payer: Medicare Other

## 2021-04-18 ENCOUNTER — Encounter: Payer: Self-pay | Admitting: Family Medicine

## 2021-04-18 ENCOUNTER — Other Ambulatory Visit: Payer: Self-pay

## 2021-04-18 ENCOUNTER — Ambulatory Visit: Payer: Medicare Other | Admitting: Family Medicine

## 2021-04-18 VITALS — BP 124/69 | HR 60 | Temp 97.5°F | Wt 166.4 lb

## 2021-04-18 DIAGNOSIS — I129 Hypertensive chronic kidney disease with stage 1 through stage 4 chronic kidney disease, or unspecified chronic kidney disease: Secondary | ICD-10-CM

## 2021-04-18 DIAGNOSIS — L84 Corns and callosities: Secondary | ICD-10-CM

## 2021-04-18 NOTE — Assessment & Plan Note (Signed)
Hypotension has resolved. BP doing well. Continue current regimen. Continue to monitor. Call with any concerns. Labs drawn today.

## 2021-04-18 NOTE — Progress Notes (Signed)
BP 124/69   Pulse 60   Temp (!) 97.5 F (36.4 C)   Wt 166 lb 6.4 oz (75.5 kg)   SpO2 98%   BMI 27.27 kg/m    Subjective:    Patient ID: Gregg Thomas, male    DOB: June 13, 1931, 87 y.o.   MRN: [id number]  HPI: Gregg Thomas is a 87 y.o. male  Chief Complaint  Patient presents with  . Hypertension   HYPERTENSION Hypertension status: better  Satisfied with current treatment? yes Duration of hypertension: chronic BP monitoring frequency:  not checking BP medication side effects:  no Medication compliance: excellent compliance Aspirin: no Recurrent headaches: no Visual changes: no Palpitations: no Dyspnea: no Chest pain: no Lower extremity edema: no Dizzy/lightheaded: no  Relevant past medical, surgical, family and social history reviewed and updated as indicated. Interim medical history since our last visit reviewed. Allergies and medications reviewed and updated.  Review of Systems  Constitutional: Negative.   Respiratory: Negative.   Cardiovascular: Negative.   Gastrointestinal: Negative.   Musculoskeletal: Negative.   Skin: Negative.   Psychiatric/Behavioral: Negative.     Per HPI unless specifically indicated above     Objective:    BP 124/69   Pulse 60   Temp (!) 97.5 F (36.4 C)   Wt 166 lb 6.4 oz (75.5 kg)   SpO2 98%   BMI 27.27 kg/m   Wt Readings from Last 3 Encounters:  04/18/21 166 lb 6.4 oz (75.5 kg)  03/20/21 166 lb 3.2 oz (75.4 kg)  10/12/20 169 lb (76.7 kg)    Physical Exam Vitals and nursing note reviewed.  Constitutional:      General: He is not in acute distress.    Appearance: Normal appearance. He is not ill-appearing, toxic-appearing or diaphoretic.  HENT:     Head: Normocephalic and atraumatic.     Right Ear: External ear normal.     Left Ear: External ear normal.     Nose: Nose normal.     Mouth/Throat:     Mouth: Mucous membranes are moist.     Pharynx: Oropharynx is clear.  Eyes:     General: No  scleral icterus.       Right eye: No discharge.        Left eye: No discharge.     Extraocular Movements: Extraocular movements intact.     Conjunctiva/sclera: Conjunctivae normal.     Pupils: Pupils are equal, round, and reactive to light.  Cardiovascular:     Rate and Rhythm: Normal rate and regular rhythm.     Pulses: Normal pulses.     Heart sounds: Normal heart sounds. No murmur heard. No friction rub. No gallop.   Pulmonary:     Effort: Pulmonary effort is normal. No respiratory distress.     Breath sounds: Normal breath sounds. No stridor. No wheezing, rhonchi or rales.  Chest:     Chest wall: No tenderness.  Musculoskeletal:        General: Normal range of motion.     Cervical back: Normal range of motion and neck supple.  Skin:    General: Skin is warm and dry.     Capillary Refill: Capillary refill takes less than 2 seconds.     Coloration: Skin is not jaundiced or pale.     Findings: No bruising, erythema, lesion or rash.  Neurological:     General: No focal deficit present.     Mental Status: He is alert and oriented to person,  place, and time. Mental status is at baseline.  Psychiatric:        Mood and Affect: Mood normal.        Behavior: Behavior normal.        Thought Content: Thought content normal.        Judgment: Judgment normal.     Results for orders placed or performed in visit on 03/20/21  CBC with Differential/Platelet  Result Value Ref Range   WBC 5.3 3.4 - 10.8 x10E3/uL   RBC 4.65 4.14 - 5.80 x10E6/uL   Hemoglobin 15.1 13.0 - 17.7 g/dL   Hematocrit 45.1 37.5 - 51.0 %   MCV 97 79 - 97 fL   MCH 32.5 26.6 - 33.0 pg   MCHC 33.5 31.5 - 35.7 g/dL   RDW 13.2 11.6 - 15.4 %   Platelets 229 150 - 450 x10E3/uL   Neutrophils 61 Not Estab. %   Lymphs 23 Not Estab. %   Monocytes 12 Not Estab. %   Eos 3 Not Estab. %   Basos 1 Not Estab. %   Neutrophils Absolute 3.2 1.4 - 7.0 x10E3/uL   Lymphocytes Absolute 1.2 0.7 - 3.1 x10E3/uL   Monocytes Absolute  0.6 0.1 - 0.9 x10E3/uL   EOS (ABSOLUTE) 0.1 0.0 - 0.4 x10E3/uL   Basophils Absolute 0.1 0.0 - 0.2 x10E3/uL   Immature Granulocytes 0 Not Estab. %   Immature Grans (Abs) 0.0 0.0 - 0.1 L37D4/KA  Basic metabolic panel  Result Value Ref Range   Glucose 80 65 - 99 mg/dL   BUN 23 8 - 27 mg/dL   Creatinine, Ser 2.19 (H) 0.76 - 1.27 mg/dL   eGFR 28 (L) >59 mL/min/1.73   BUN/Creatinine Ratio 11 10 - 24   Sodium 143 134 - 144 mmol/L   Potassium 5.3 (H) 3.5 - 5.2 mmol/L   Chloride 107 (H) 96 - 106 mmol/L   CO2 21 20 - 29 mmol/L   Calcium 9.7 8.6 - 10.2 mg/dL      Assessment & Plan:   Problem List Items Addressed This Visit      Genitourinary   Benign hypertensive renal disease - Primary    Hypotension has resolved. BP doing well. Continue current regimen. Continue to monitor. Call with any concerns. Labs drawn today.      Relevant Orders   Basic metabolic panel    Other Visit Diagnoses    Callus of foot       Will start putting some vasaline on it, if not improving, let us know and we'll get him into podiatry.       Follow up plan: Return in about 6 months (around 10/18/2021).

## 2021-04-19 LAB — BASIC METABOLIC PANEL
BUN/Creatinine Ratio: 10 (ref 10–24)
BUN: 22 mg/dL (ref 8–27)
CO2: 22 mmol/L (ref 20–29)
Calcium: 10.1 mg/dL (ref 8.6–10.2)
Chloride: 106 mmol/L (ref 96–106)
Creatinine, Ser: 2.2 mg/dL — ABNORMAL HIGH (ref 0.76–1.27)
Glucose: 88 mg/dL (ref 65–99)
Potassium: 4.7 mmol/L (ref 3.5–5.2)
Sodium: 143 mmol/L (ref 134–144)
eGFR: 28 mL/min/{1.73_m2} — ABNORMAL LOW (ref >59)

## 2021-04-21 ENCOUNTER — Encounter: Payer: Self-pay | Admitting: Family Medicine

## 2021-04-24 ENCOUNTER — Other Ambulatory Visit: Payer: Self-pay | Admitting: Family Medicine

## 2021-05-01 ENCOUNTER — Inpatient Hospital Stay: Payer: Medicare Other | Attending: Hematology and Oncology

## 2021-05-01 ENCOUNTER — Inpatient Hospital Stay (HOSPITAL_BASED_OUTPATIENT_CLINIC_OR_DEPARTMENT_OTHER): Payer: Medicare Other | Admitting: Internal Medicine

## 2021-05-01 ENCOUNTER — Encounter: Payer: Self-pay | Admitting: Internal Medicine

## 2021-05-01 ENCOUNTER — Encounter (INDEPENDENT_AMBULATORY_CARE_PROVIDER_SITE_OTHER): Payer: Self-pay

## 2021-05-01 ENCOUNTER — Other Ambulatory Visit: Payer: Self-pay

## 2021-05-01 DIAGNOSIS — Z9181 History of falling: Secondary | ICD-10-CM | POA: Diagnosis not present

## 2021-05-01 DIAGNOSIS — I82502 Chronic embolism and thrombosis of unspecified deep veins of left lower extremity: Secondary | ICD-10-CM | POA: Insufficient documentation

## 2021-05-01 DIAGNOSIS — M791 Myalgia, unspecified site: Secondary | ICD-10-CM | POA: Insufficient documentation

## 2021-05-01 DIAGNOSIS — Z7901 Long term (current) use of anticoagulants: Secondary | ICD-10-CM | POA: Diagnosis not present

## 2021-05-01 DIAGNOSIS — Z823 Family history of stroke: Secondary | ICD-10-CM | POA: Insufficient documentation

## 2021-05-01 DIAGNOSIS — I82512 Chronic embolism and thrombosis of left femoral vein: Secondary | ICD-10-CM

## 2021-05-01 DIAGNOSIS — N183 Chronic kidney disease, stage 3 unspecified: Secondary | ICD-10-CM | POA: Insufficient documentation

## 2021-05-01 DIAGNOSIS — M2550 Pain in unspecified joint: Secondary | ICD-10-CM | POA: Insufficient documentation

## 2021-05-01 DIAGNOSIS — Z8249 Family history of ischemic heart disease and other diseases of the circulatory system: Secondary | ICD-10-CM | POA: Insufficient documentation

## 2021-05-01 DIAGNOSIS — Z79899 Other long term (current) drug therapy: Secondary | ICD-10-CM | POA: Diagnosis not present

## 2021-05-01 DIAGNOSIS — C44320 Squamous cell carcinoma of skin of unspecified parts of face: Secondary | ICD-10-CM | POA: Insufficient documentation

## 2021-05-01 LAB — CBC WITH DIFFERENTIAL/PLATELET
Abs Immature Granulocytes: 0.06 10*3/uL (ref 0.00–0.07)
Basophils Absolute: 0.1 10*3/uL (ref 0.0–0.1)
Basophils Relative: 1 %
Eosinophils Absolute: 0.2 10*3/uL (ref 0.0–0.5)
Eosinophils Relative: 3 %
HCT: 41.9 % (ref 39.0–52.0)
Hemoglobin: 14.1 g/dL (ref 13.0–17.0)
Immature Granulocytes: 1 %
Lymphocytes Relative: 29 %
Lymphs Abs: 1.6 10*3/uL (ref 0.7–4.0)
MCH: 33 pg (ref 26.0–34.0)
MCHC: 33.7 g/dL (ref 30.0–36.0)
MCV: 98.1 fL (ref 80.0–100.0)
Monocytes Absolute: 0.6 10*3/uL (ref 0.1–1.0)
Monocytes Relative: 11 %
Neutro Abs: 3 10*3/uL (ref 1.7–7.7)
Neutrophils Relative %: 55 %
Platelets: 201 10*3/uL (ref 150–400)
RBC: 4.27 MIL/uL (ref 4.22–5.81)
RDW: 13.9 % (ref 11.5–15.5)
WBC: 5.5 10*3/uL (ref 4.0–10.5)
nRBC: 0 % (ref 0.0–0.2)

## 2021-05-01 LAB — BASIC METABOLIC PANEL
Anion gap: 11 (ref 5–15)
BUN: 28 mg/dL — ABNORMAL HIGH (ref 8–23)
CO2: 23 mmol/L (ref 22–32)
Calcium: 10.2 mg/dL (ref 8.9–10.3)
Chloride: 108 mmol/L (ref 98–111)
Creatinine, Ser: 2.05 mg/dL — ABNORMAL HIGH (ref 0.61–1.24)
GFR, Estimated: 31 mL/min — ABNORMAL LOW (ref >60)
Glucose, Bld: 89 mg/dL (ref 70–99)
Potassium: 5.1 mmol/L (ref 3.5–5.1)
Sodium: 142 mmol/L (ref 135–145)

## 2021-05-01 LAB — PROTIME-INR
INR: 3.1 — ABNORMAL HIGH (ref 0.8–1.2)
Prothrombin Time: 31.6 seconds — ABNORMAL HIGH (ref 11.4–15.2)

## 2021-05-01 NOTE — Assessment & Plan Note (Addendum)
#   Left lower extremity chronic DVT/PE [0881]-JS new thromboembolic events. STABLE on Coumadin/warfarin- on 5 mg  A day;  [no major fluctuations noted on hedgehog inhibitor].  INR from today is 3.1-STABE.   # Basal cell carcinoma: on Hedgehog inhibitor [Dr.Dasher]; Intermittent STABLE.   #CKD-III [GFR 30s]-STABLE; Dr.Lateef.   #Gait instability/falls-recommend evaluation with Maureen/OT especially being on anticoagulation.  # DISPOSITION: # refer to Gwenette Greet- re: falls # PT/INR every month x 6  # in 6 months- MD; cbc/bmp/Pt/INR-Dr.B  Cc;

## 2021-05-01 NOTE — Progress Notes (Signed)
[facility] CONSULT NOTE  Patient Care Team: Valerie Roys, DO as PCP - General (Family Medicine) Lequita Asal, MD as Referring Physician (Hematology and Oncology) Dagoberto Ligas, MD as Referring Physician (Internal Medicine) Merril Abbe, MD as Referring Physician (Dermatology)  CHIEF COMPLAINTS/PURPOSE OF CONSULTATION: Left lower extremity DVT  #  Oncology History Overview Note  #Left lower extremity chronic DVT/PE [2011]-on Coumadin [Drs.Gittin/Corcoran]  #2021-Left facial basal cell carcinoma ERIVEDGE- [Dr.Dasher]  #CKD [Dr.Lateef]/    Squamous cell carcinoma, face  10/22/2018 Initial Diagnosis   Squamous cell carcinoma, face     HISTORY OF PRESENTING ILLNESS:  Gregg Thomas 87 y.o.  male history of chronic left lower extremity DVT on Coumadin and history of CKD-III-IV; on erivedge for [facility] is here for follow-up.   Patient continues to be on erivedge intermittently for basal cell carcinoma.  Complains of cramping/arthritis of his left hand.   Denies any weight loss.  Denies any nausea vomiting.  No diarrhea.  Complains of mechanical fall at home because of gait instability.  Denies any trauma to the head.  Review of Systems  Constitutional: Negative for chills, diaphoresis, fever, malaise/fatigue and weight loss.  HENT: Negative for nosebleeds and sore throat.   Eyes: Negative for double vision.  Respiratory: Negative for cough, hemoptysis, sputum production, shortness of breath and wheezing.   Cardiovascular: Negative for chest pain, palpitations, orthopnea and leg swelling.  Gastrointestinal: Negative for abdominal pain, blood in stool, constipation, diarrhea, heartburn, melena, nausea and vomiting.  Genitourinary: Negative for dysuria, frequency and urgency.  Musculoskeletal: Positive for joint pain and myalgias. Negative for back pain.  Skin: Negative.  Negative for itching and rash.  Neurological: Negative for dizziness,  tingling, focal weakness, weakness and headaches.  Endo/Heme/Allergies: Bruises/bleeds easily.  Psychiatric/Behavioral: Positive for memory loss. Negative for depression. The patient is not nervous/anxious and does not have insomnia.      MEDICAL HISTORY:  Past Medical History:  Diagnosis Date  . Chronic kidney disease   . Clotting disorder ([place])   . Heart murmur   . Skin cancer     SURGICAL HISTORY: Past Surgical History:  Procedure Laterality Date  . biopsy Right 08/26/2019   temple biopsy - dr.dasher   . EYE SURGERY Right    benign growth on right eye   . KNEE SURGERY Right   . MELANOMA EXCISION Bilateral    lower back     SOCIAL HISTORY: Social History   Socioeconomic History  . Marital status: Married    Spouse name: Not on file  . Number of children: Not on file  . Years of education: 13 years   . Highest education level: Some college, no degree  Occupational History  . Occupation: retired  Tobacco Use  . Smoking status: Never Smoker  . Smokeless tobacco: Never Used  Vaping Use  . Vaping Use: Never used  Substance and Sexual Activity  . Alcohol use: No  . Drug use: No  . Sexual activity: Not on file  Other Topics Concern  . Not on file  Social History Narrative   [place] 2 times a week, alk 3 times a week    Social Determinants of Radio broadcast assistant Strain: Low Risk   . Difficulty of Paying Living Expenses: Not hard at all  Food Insecurity: No Food Insecurity  . Worried About Charity fundraiser in the Last  Year: Never true  . Ran Out of Food in the Last Year: Never true  Transportation Needs: No Transportation Needs  . Lack of Transportation (Medical): No  . Lack of Transportation (Non-Medical): No  Physical Activity: Insufficiently Active  . Days of Exercise per Week: 3 days  . Minutes of Exercise per Session: 30 min  Stress: No Stress Concern Present  . Feeling of Stress :  Not at all  Social Connections: Not on file  Intimate Partner Violence: Not on file    FAMILY HISTORY: Family History  Problem Relation Age of Onset  . Stroke Mother   . Cerebral aneurysm Father     ALLERGIES:  has No Known Allergies.  MEDICATIONS:  Current Outpatient Medications  Medication Sig Dispense Refill  . amLODipine (NORVASC) 5 MG tablet Take 1 tablet (5 mg total) by mouth daily. 90 tablet 1  . cetirizine (ZYRTEC) 5 MG tablet Take 1 tablet (5 mg total) by mouth daily. 90 tablet 1  . Cholecalciferol (VITAMIN D3 PO) Take 1 tablet by mouth daily.    . Cyanocobalamin 1000 MCG TBCR Take 1 tablet by mouth daily. 3-4 times a week    . donepezil (ARICEPT) 5 MG tablet Take 1 tablet (5 mg total) by mouth at bedtime. 30 tablet 3  . ELDERBERRY PO Take 1 tablet by mouth daily.    . enalapril (VASOTEC) 20 MG tablet Take 20 mg by mouth daily.     Marland Kitchen ERIVEDGE 150 MG capsule Take 150 mg by mouth daily.     . fluticasone (FLONASE) 50 MCG/ACT nasal spray Place 2 sprays into both nostrils daily. 16 g 6  . Grape Seed Extract 50 MG CAPS Take 1,300 mg by mouth daily.     Marland Kitchen warfarin (COUMADIN) 5 MG tablet Take 1 tablet (5 mg total) by mouth one time only at 6 PM. 90 tablet 0   No current facility-administered medications for this visit.      Marland Kitchen  PHYSICAL EXAMINATION: ECOG PERFORMANCE STATUS: 0 - Asymptomatic  Vitals:   05/01/21 1455  BP: 118/66  Pulse: 67  Resp: 20  Temp: (!) 95 F (35 C)  SpO2: 100%   Filed Weights   05/01/21 1455  Weight: 166 lb (75.3 kg)    Physical Exam Constitutional:      Comments: Elderly appearing Caucasian male patient accompanied by his wife.  Is walking himself.  HENT:     Head: Normocephalic and atraumatic.     Mouth/Throat:     Pharynx: No oropharyngeal exudate.  Eyes:     Pupils: Pupils are equal, round, and reactive to light.  Cardiovascular:     Rate and Rhythm: Normal rate and regular rhythm.  Pulmonary:     Effort: Pulmonary effort is  normal. No respiratory distress.     Breath sounds: Normal breath sounds. No wheezing.  Abdominal:     General: Bowel sounds are normal. There is no distension.     Palpations: Abdomen is soft. There is no mass.     Tenderness: There is no abdominal tenderness. There is no guarding or rebound.  Musculoskeletal:        General: No tenderness. Normal range of motion.     Cervical back: Normal range of motion and neck supple.  Skin:    General: Skin is warm.  Neurological:     Mental Status: He is alert and oriented to person, place, and time.  Psychiatric:        Mood and Affect:  Affect normal.      LABORATORY DATA:  I have reviewed the data as listed Lab Results  Component Value Date   WBC 5.5 05/01/2021   HGB 14.1 05/01/2021   HCT 41.9 05/01/2021   MCV 98.1 05/01/2021   PLT 201 05/01/2021   Recent Labs    06/20/20 1625 09/20/20 1435 10/12/20 1314 03/20/21 1345 04/18/21 1348 05/01/21 1423  NA 141 141 140 143 143 142  K 5.1 5.0 4.5 5.3* 4.7 5.1  CL 106 107* 110 107* 106 108  CO2 '22 21 24 21 22 23  ' GLUCOSE 92 86 99 80 88 89  BUN 28* 28* 24* 23 22 28*  CREATININE 2.28* 2.37* 2.02* 2.19* 2.20* 2.05*  CALCIUM 11.1* 9.9 9.0 9.7 10.1 10.2  GFRNONAA 25* 24* 29*  --   --  31*  GFRAA 29* 27*  --   --   --   --   PROT 6.9 7.0  --   --   --   --   ALBUMIN 4.0 4.2  --   --   --   --   AST 22 19  --   --   --   --   ALT 30 15  --   --   --   --   ALKPHOS 116 114  --   --   --   --   BILITOT 0.7 0.8  --   --   --   --     RADIOGRAPHIC STUDIES: I have personally reviewed the radiological images as listed and agreed with the findings in the report. No results found.  ASSESSMENT & PLAN:   Chronic deep vein thrombosis (DVT) of femoral vein of left lower extremity (HCC) # Left lower extremity chronic DVT/PE [9485]-IO new thromboembolic events. STABLE on Coumadin/warfarin- on 5 mg  A day;  [no major fluctuations noted on hedgehog inhibitor].  INR from today is 3.1-STABE.   #  Basal cell carcinoma: on Hedgehog inhibitor [Dr.Dasher]; Intermittent STABLE.   #CKD-III [GFR 30s]-STABLE; Dr.Lateef.   #Gait instability/falls-recommend evaluation with Maureen/OT especially being on anticoagulation.  # DISPOSITION: # refer to Gwenette Greet- re: falls # PT/INR every month x 6  # in 6 months- MD; cbc/bmp/Pt/INR-Dr.B  Cc;   All questions were answered. The patient knows to call the clinic with any problems, questions or concerns.    Cammie Sickle, MD 05/01/2021 8:06 PM

## 2021-05-09 ENCOUNTER — Other Ambulatory Visit: Payer: Self-pay

## 2021-05-09 MED ORDER — CETIRIZINE HCL 5 MG PO TABS: 5.0000 mg | ORAL_TABLET | Freq: Every day | ORAL | 1 refills | Status: DC

## 2021-05-10 ENCOUNTER — Inpatient Hospital Stay: Payer: Medicare Other | Admitting: Occupational Therapy

## 2021-05-10 DIAGNOSIS — Z9181 History of falling: Secondary | ICD-10-CM

## 2021-05-10 NOTE — Therapy (Signed)
[facility] [address] [place], [place], [place], [address] Phone: [phone]   Fax:  [phone]  Occupational Therapy Screen:  Patient Details  Name: Gregg Thomas MRN: [id number] Date of Birth: 1931/04/26 No data recorded  Encounter Date: 05/10/2021   OT End of Session - 05/10/21 1125    Visit Number 0           Past Medical History:  Diagnosis Date  . Chronic kidney disease   . Clotting disorder ([place])   . Heart murmur   . Skin cancer     Past Surgical History:  Procedure Laterality Date  . biopsy Right 08/26/2019   temple biopsy - dr.dasher   . EYE SURGERY Right    benign growth on right eye   . KNEE SURGERY Right   . MELANOMA EXCISION Bilateral    lower back     There were no vitals filed for this visit.   Subjective Assessment - 05/10/21 1124    Subjective  My ankles just feel weak and when I fell my legs just gave out - they did adjust my bloodpressure medication few wks ago - no playing golf anymore the last 2 yrs or walking in neighbourhood               Dr Rogue Bussing NOTE 05/01/21  ASSESSMENT & PLAN:   Chronic deep vein thrombosis (DVT) of femoral vein of left lower extremity ([place]) # Left lower extremity chronic DVT/PE [5681]-EX new thromboembolic events. STABLE on Coumadin/warfarin- on 5 mg  A day;  [no major fluctuations noted on hedgehog inhibitor].  INR from today is 3.1-STABE.   # Basal cell carcinoma: on Hedgehog inhibitor [Dr.Dasher]; Intermittent STABLE.   #CKD-III [GFR 30s]-STABLE; Dr.Lateef.   #Gait instability/falls-recommend evaluation with Darlynn Ricco/OT especially being on anticoagulation.  # DISPOSITION: # refer to Gwenette Greet- re: falls # PT/INR every month x 6  # in 6 months- MD; cbc/bmp/Pt/INR-Dr.B   OT SCREEN 05/10/21:   Pt report his legs gave out few wks ago and fell - but otherwise feels good. His ankles feels week and wife report pt do not pick up his  feet when walking.  Up to 2 yrs ago played golf 2 days wk and walk some in neighborhood. Not anymore and played senior soft ball league soft ball up to 4 yrs ago.  Pt to have hx of DVT on L , bloodthinner -and suppose to wear compression hose. Report his ankles feels better if wearing compression.  Pt ed on use of compression and importance. BERG balance test 53/56 - LOW risk for falling Strength in hips, knees 5/5 - but decrease dorsiflexion showing  -Review calf stretches prior to some AROM dorsiflexion, and then also to do heal raises daily  -Sideways stepping 1-2 min -Walking in driveway -sit <> stand - without hands 10-12 reps - 2 x day -Walk FW <> BW wide base - wife to guard incase when walking BW in hallway 1-2 min  -If going out and using stairs with railing- repeat up and down few xtra times- wife to be with him in case  Pt to cont with HEP - 150 min wk of moderate activity  Or about 23 min day  Pt very pleasant and motivated                              Patient will benefit from skilled therapeutic intervention in  order to improve the following deficits and impairments:           Visit Diagnosis: History of falling    Problem List Patient Active Problem List   Diagnosis Date Noted  . Cough 08/11/2020  . Acquired thrombophilia ([place]) 06/20/2020  . Memory loss 06/20/2020  . Chronic deep vein thrombosis (DVT) of femoral vein of left lower extremity ([place]) 11/23/2019  . Benign hypertensive renal disease 02/03/2019  . Advanced care planning/counseling discussion 02/03/2019  . Hearing loss 02/03/2019  . Squamous cell carcinoma, face 10/22/2018  . CKD (chronic kidney disease) stage 4, GFR 15-29 ml/min (HCC) 10/22/2018  . Chronic anticoagulation 08/22/2017  . DVT (deep venous thrombosis) ([place]) 07/08/2015  . Pulmonary embolism ([place]) 07/08/2015    Rosalyn Gess OTR/L,CLT 05/10/2021, 11:26 AM  [facility] [address] [place], [place], [place], [address] Phone: [phone]   Fax:  [phone]  Name: Gregg Thomas MRN: [id number] Date of Birth: 02/18/1931

## 2021-06-05 ENCOUNTER — Inpatient Hospital Stay: Payer: Medicare Other | Attending: Internal Medicine

## 2021-06-05 DIAGNOSIS — I82512 Chronic embolism and thrombosis of left femoral vein: Secondary | ICD-10-CM | POA: Insufficient documentation

## 2021-06-05 LAB — PROTIME-INR
INR: 3 — ABNORMAL HIGH (ref 0.8–1.2)
Prothrombin Time: 31.1 seconds — ABNORMAL HIGH (ref 11.4–15.2)

## 2021-06-30 ENCOUNTER — Other Ambulatory Visit: Payer: Self-pay | Admitting: Internal Medicine

## 2021-07-04 ENCOUNTER — Other Ambulatory Visit: Payer: Self-pay | Admitting: Family Medicine

## 2021-07-31 ENCOUNTER — Inpatient Hospital Stay: Payer: Medicare Other | Attending: Internal Medicine

## 2021-07-31 DIAGNOSIS — I82512 Chronic embolism and thrombosis of left femoral vein: Secondary | ICD-10-CM | POA: Diagnosis present

## 2021-07-31 LAB — PROTIME-INR
INR: 2.7 — ABNORMAL HIGH (ref 0.8–1.2)
Prothrombin Time: 29.1 seconds — ABNORMAL HIGH (ref 11.4–15.2)

## 2021-08-01 ENCOUNTER — Other Ambulatory Visit: Payer: Self-pay | Admitting: Family Medicine

## 2021-08-01 NOTE — Telephone Encounter (Signed)
Requested Prescriptions  Pending Prescriptions Disp Refills  . donepezil (ARICEPT) 5 MG tablet [Pharmacy Med Name: DONEPEZIL HCL 5 MG TABLET] 30 tablet 0    Sig: Take 1 tablet (5 mg total) by mouth at bedtime.     Neurology:  Alzheimer's Agents Passed - 08/01/2021  7:41 AM      Passed - Valid encounter within last 6 months    Recent Outpatient Visits          3 months ago Benign hypertensive renal disease   [facility] [place], Megan P, DO   4 months ago Benign hypertensive renal disease   [facility] [place], Megan P, DO   10 months ago Routine general medical examination at a health care facility   [facility] [place], DO   11 months ago Upper respiratory tract infection, unspecified type   [facility] Eul[place] Bear, NP   1 year ago Memory loss   [place], Barb Merino, DO      Future Appointments            In 1 month  [place], [place]   In 2 months [place], Megan P, DO [employer] [facility], [place]

## 2021-08-28 ENCOUNTER — Other Ambulatory Visit: Payer: Self-pay | Admitting: Family Medicine

## 2021-08-28 NOTE — Telephone Encounter (Signed)
Requested Prescriptions  Pending Prescriptions Disp Refills  . donepezil (ARICEPT) 5 MG tablet [Pharmacy Med Name: DONEPEZIL HCL 5 MG TABLET] 30 tablet 2    Sig: Take 1 tablet (5 mg total) by mouth at bedtime.     Neurology:  Alzheimer's Agents Passed - 08/28/2021  2:16 PM      Passed - Valid encounter within last 6 months    Recent Outpatient Visits          4 months ago Benign hypertensive renal disease   [facility] [place], Megan P, DO   5 months ago Benign hypertensive renal disease   [facility] [place], Megan P, DO   11 months ago Routine general medical examination at a health care facility   [facility] [place], DO   1 year ago Upper respiratory tract infection, unspecified type   [facility] Eulogio Bear, NP   1 year ago Memory loss   [place], Barb Merino, DO      Future Appointments            In 3 weeks  [facility] [facility], [place]   In 1 month [place], Megan P, DO [employer], [facility]

## 2021-09-05 ENCOUNTER — Inpatient Hospital Stay: Payer: Medicare Other | Attending: Internal Medicine

## 2021-09-05 DIAGNOSIS — I82512 Chronic embolism and thrombosis of left femoral vein: Secondary | ICD-10-CM | POA: Diagnosis not present

## 2021-09-05 LAB — PROTIME-INR
INR: 2.3 — ABNORMAL HIGH (ref 0.8–1.2)
Prothrombin Time: 25.1 seconds — ABNORMAL HIGH (ref 11.4–15.2)

## 2021-09-20 ENCOUNTER — Ambulatory Visit: Payer: Medicare Other

## 2021-09-22 ENCOUNTER — Ambulatory Visit (INDEPENDENT_AMBULATORY_CARE_PROVIDER_SITE_OTHER): Payer: Medicare Other

## 2021-09-22 VITALS — Ht 66.0 in | Wt 170.0 lb

## 2021-09-22 DIAGNOSIS — Z0000 Encounter for general adult medical examination without abnormal findings: Secondary | ICD-10-CM

## 2021-09-22 NOTE — Progress Notes (Signed)
I connected with Gregg Thomas today by telephone and verified that I am speaking with the correct person using two identifiers. Location patient: home Location provider: work Persons participating in the virtual visit: Rahmel, Nedved LPN.   I discussed the limitations, risks, security and privacy concerns of performing an evaluation and management service by telephone and the availability of in person appointments. I also discussed with the patient that there may be a patient responsible charge related to this service. The patient expressed understanding and verbally consented to this telephonic visit.    Interactive audio and video telecommunications were attempted between this provider and patient, however failed, due to patient having technical difficulties OR patient did not have access to video capability.  We continued and completed visit with audio only.     Vital signs may be patient reported or missing.  Subjective:   Gregg Thomas is a 87 y.o. male who presents for Medicare Annual/Subsequent preventive examination.  Review of Systems     Cardiac Risk Factors include: advanced age (>55mn, >>5women);hypertension;male gender     Objective:    Today's Vitals   09/22/21 1341  Weight: 170 lb (77.1 kg)  Height: _0  (1.676 m)   Body mass index is 27.44 kg/m.  Advanced Directives 09/22/2021 05/01/2021 09/19/2020 02/15/2020 09/09/2019 06/08/2019 12/05/2018  Does Patient Have a Medical Advance Directive? Yes No Yes No Yes Yes Yes  Type of AParamedicof ABazineLiving will - [place]ristonLiving will - Living will;Healthcare Power of [place]oneLiving will Living will;Healthcare Power of Attorney  Does patient want to make changes to medical advance directive? - No - Patient declined - - - - No - Patient declined  Copy of HClioin Chart? No - copy requested - No - copy  requested - No - copy requested No - copy requested No - copy requested  Would patient like information on creating a medical advance directive? - - - No - Patient declined - - -    Current Medications (verified) Outpatient Encounter Medications as of 09/22/2021  Medication Sig   amLODipine (NORVASC) 5 MG tablet Take 1 tablet (5 mg total) by mouth daily.   cetirizine (ZYRTEC) 5 MG tablet Take 1 tablet (5 mg total) by mouth daily.   Cholecalciferol (VITAMIN D3 PO) Take 1 tablet by mouth daily.   Cyanocobalamin 1000 MCG TBCR Take 1 tablet by mouth daily. 3-4 times a week   donepezil (ARICEPT) 5 MG tablet Take 1 tablet (5 mg total) by mouth at bedtime.   ELDERBERRY PO Take 1 tablet by mouth daily.   enalapril (VASOTEC) 20 MG tablet Take 20 mg by mouth daily.    fluticasone (FLONASE) 50 MCG/ACT nasal spray Place 2 sprays into both nostrils daily.   Grape Seed Extract 50 MG CAPS Take 1,300 mg by mouth daily.    warfarin (COUMADIN) 5 MG tablet Take 1 tablet (5 mg total) by mouth one time only at 6 PM.   ERIVEDGE 150 MG capsule Take 150 mg by mouth daily.  (Patient not taking: Reported on 09/22/2021)   No facility-administered encounter medications on file as of 09/22/2021.    Allergies (verified) Patient has no known allergies.   History: Past Medical History:  Diagnosis Date   Chronic kidney disease    [place]    Heart murmur    Skin cancer    Past Surgical History:  Procedure Laterality Date  biopsy Right 08/26/2019   temple biopsy - dr.dasher    EYE SURGERY Right    benign growth on right eye    KNEE SURGERY Right    MELANOMA EXCISION Bilateral    lower back    Family History  Problem Relation Age of Onset   Stroke Mother    Cerebral aneurysm Father    Social History   Socioeconomic History   Marital status: Married    Spouse name: Not on file   Number of children: Not on file   Years of education: 13 years    Highest education level: Some college, no  degree  Occupational History   Occupation: retired  Tobacco Use   Smoking status: Never   Smokeless tobacco: Never  Vaping Use   Vaping Use: Never used  Substance and Sexual Activity   Alcohol use: No   Drug use: No   Sexual activity: Not on file  Other Topics Concern   Not on file  Social History Narrative   [place] 2 times a week, alk 3 times a week    Social Determinants of Radio broadcast assistant Strain: Low Risk    Difficulty of Paying Living Expenses: Not hard at all  Food Insecurity: No Food Insecurity   Worried About Charity fundraiser in the Last Year: Never true   Arboriculturist in the Last Year: Never true  Transportation Needs: No Transportation Needs   Lack of Transportation (Medical): No   Lack of Transportation (Non-Medical): No  Physical Activity: Sufficiently Active   Days of Exercise per Week: 5 days   Minutes of Exercise per Session: 30 min  Stress: No Stress Concern Present   Feeling of Stress : Not at all  Social Connections: Not on file    Tobacco Counseling Counseling given: Not Answered   Clinical Intake:  Pre-visit preparation completed: Yes  Pain : No/denies pain     Nutritional Status: BMI 25 -29 Overweight Nutritional Risks: None Diabetes: No  How often do you need to have someone help you when you read instructions, pamphlets, or other written materials from your doctor or pharmacy?: 1 - Never What is the last grade level you completed in school?: 12th grade  Diabetic? no  Interpreter Needed?: No  Information entered by :: NAllen LPN   Activities of Daily Living In your present state of health, do you have any difficulty performing the following activities: 09/22/2021  Hearing? N  Vision? N  Difficulty concentrating or making decisions? Y  Walking or climbing stairs? N  Dressing or bathing? N  Doing errands, shopping? N  Preparing Food and eating ? N   Using the Toilet? N  In the past six months, have you accidently leaked urine? N  Do you have problems with loss of bowel control? N  Managing your Medications? N  Managing your Finances? N  Housekeeping or managing your Housekeeping? N  Some recent data might be hidden    Patient Care Team: Valerie Roys, DO as PCP - General (Family Medicine) Lequita Asal, MD (Inactive) as Referring Physician (Hematology and Oncology) Dagoberto Ligas, MD as Referring Physician (Internal Medicine) Merril Abbe, MD as Referring Physician (Dermatology) Cammie Sickle, MD as Consulting Physician (Hematology and Oncology)  Indicate any recent Medical Services you may have received from other than [facility] providers in the past year (date may be  approximate).     Assessment:   This is a routine wellness examination for Julias.  Hearing/Vision screen Vision Screening - Comments:: No regular eye exams,  Dietary issues and exercise activities discussed: Current Exercise Habits: Home exercise routine, Type of exercise: walking, Time (Minutes): 30, Frequency (Times/Week): 5, Weekly Exercise (Minutes/Week): 150   Goals Addressed             This Visit's Progress    Patient Stated       09/22/2021,no goals       Depression Screen PHQ 2/9 Scores 09/22/2021 09/20/2020 09/19/2020 09/09/2019 02/03/2019 09/04/2018 08/29/2017  PHQ - 2 Score 0 0 0 0 0 0 0  PHQ- 9 Score - 1 - - 0 - -    Fall Risk Fall Risk  09/22/2021 09/19/2020 09/09/2019 02/03/2019 09/04/2018  Falls in the past year? 0 0 0 0 No  Number falls in past yr: - - - 0 -  Injury with Fall? - - - 0 -  Risk for fall due to : Medication side effect Medication side effect - - -  Follow up Falls evaluation completed;Education provided;Falls prevention discussed Falls evaluation completed;Education provided;Falls prevention discussed - - -    FALL RISK PREVENTION PERTAINING TO THE HOME:  Any stairs in or around the home? Yes  If so, are  there any without handrails? No  Home free of loose throw rugs in walkways, pet beds, electrical cords, etc? Yes  Adequate lighting in your home to reduce risk of falls? Yes   ASSISTIVE DEVICES UTILIZED TO PREVENT FALLS:  Life alert? No  Use of a cane, walker or w/c? No  Grab bars in the bathroom? Yes  Shower chair or bench in shower? Yes  Elevated toilet seat or a handicapped toilet? No   TIMED UP AND GO:  Was the test performed? No .      Cognitive Function: MMSE - Mini Mental State Exam 06/20/2020  Orientation to time 4  Orientation to Place 5  Registration 3  Attention/ Calculation 5  Recall 1  Language- name 2 objects 2  Language- repeat 1  Language- follow 3 step command 3  Language- read & follow direction 1  Write a sentence 1  Copy design 1  Total score 27     6CIT Screen 09/22/2021 09/19/2020 06/20/2020 09/04/2018 08/29/2017  What Year? 0 points 0 points 0 points 0 points 0 points  What month? 0 points 0 points 0 points 0 points 0 points  What time? 3 points 3 points 0 points 0 points 0 points  Count back from 20 0 points 0 points 0 points 0 points 0 points  Months in reverse 0 points 2 points 0 points 0 points 0 points  Repeat phrase 4 points 0 points 2 points 0 points 0 points  Total Score _0 0 0    Immunizations Immunization History  Administered Date(s) Administered   Fluad Quad(high Dose 65+) 09/20/2020   Influenza, High Dose Seasonal PF 10/09/2016, 09/04/2018   Influenza-Unspecified 10/24/2017, 09/08/2019   Moderna Sars-Covid-2 Vaccination 02/12/2020, 03/11/2020, 11/28/2020, 04/24/2021   Pneumococcal Polysaccharide-23 08/29/2017   Td 02/01/2016    TDAP status: Up to date  Flu Vaccine status: Due, Education has been provided regarding the importance of this vaccine. Advised may receive this vaccine at local pharmacy or Health Dept. Aware to provide a copy of the vaccination record if obtained from local pharmacy or Health Dept. Verbalized  acceptance and understanding.  Pneumococcal vaccine status: Up  to date  Covid-19 vaccine status: Completed vaccines  Qualifies for Shingles Vaccine? Yes   Zostavax completed No   Shingrix Completed?: No.    Education has been provided regarding the importance of this vaccine. Patient has been advised to call insurance company to determine out of pocket expense if they have not yet received this vaccine. Advised may also receive vaccine at local pharmacy or Health Dept. Verbalized acceptance and understanding.  Screening Tests Health Maintenance  Topic Date Due   Zoster Vaccines- Shingrix (1 of 2) Never done   INFLUENZA VACCINE  07/31/2021   COVID-19 Vaccine (5 - Booster for Moderna series) 08/24/2021   TETANUS/TDAP  01/31/2026   HPV VACCINES  Aged Out    Health Maintenance  Health Maintenance Due  Topic Date Due   Zoster Vaccines- Shingrix (1 of 2) Never done   INFLUENZA VACCINE  07/31/2021   COVID-19 Vaccine (5 - Booster for Moderna series) 08/24/2021    Colorectal cancer screening: No longer required.   Lung Cancer Screening: (Low Dose CT Chest recommended if Age 35-80 years, 30 pack-year currently smoking OR have quit w/in 15years.) does not qualify.   Lung Cancer Screening Referral: no  Additional Screening:  Hepatitis C Screening: does not qualify;   Vision Screening: Recommended annual ophthalmology exams for early detection of glaucoma and other disorders of the eye. Is the patient up to date with their annual eye exam?  No  Who is the provider or what is the name of the office in which the patient attends annual eye exams? none If pt is not established with a provider, would they like to be referred to a provider to establish care? No .   Dental Screening: Recommended annual dental exams for proper oral hygiene  Community Resource Referral / Chronic Care Management: CRR required this visit?  No   CCM required this visit?  No      Plan:     I have  personally reviewed and noted the following in the patient's chart:   Medical and social history Use of alcohol, tobacco or illicit drugs  Current medications and supplements including opioid prescriptions. Patient is not currently taking opioid prescriptions. Functional ability and status Nutritional status Physical activity Advanced directives List of other physicians Hospitalizations, surgeries, and ER visits in previous 12 months Vitals Screenings to include cognitive, depression, and falls Referrals and appointments  In addition, I have reviewed and discussed with patient certain preventive protocols, quality metrics, and best practice recommendations. A written personalized care plan for preventive services as well as general preventive health recommendations were provided to patient.     Kellie Simmering, LPN   [id number]   Nurse Notes:

## 2021-09-22 NOTE — Patient Instructions (Signed)
Mr. Gregg Thomas , Thank you for taking time to come for your Medicare Wellness Visit. I appreciate your ongoing commitment to your health goals. Please review the following plan we discussed and let me know if I can assist you in the future.   Screening recommendations/referrals: Colonoscopy: not required Recommended yearly ophthalmology/optometry visit for glaucoma screening and checkup Recommended yearly dental visit for hygiene and checkup  Vaccinations: Influenza vaccine: due Pneumococcal vaccine: completed 08/29/2017 Tdap vaccine: completed 02/01/2016, due 01/31/2026 Shingles vaccine: discussed   Covid-19:  04/24/2021, 11/28/2020, 03/11/2020, 02/12/2020  Advanced directives: Please bring a copy of your POA (Power of Attorney) and/or Living Will to your next appointment.   Conditions/risks identified: none  Next appointment: Follow up in one year for your annual wellness visit.   Preventive Care 26 Years and Older, Male Preventive care refers to lifestyle choices and visits with your health care provider that can promote health and wellness. What does preventive care include? A yearly physical exam. This is also called an annual well check. Dental exams once or twice a year. Routine eye exams. Ask your health care provider how often you should have your eyes checked. Personal lifestyle choices, including: Daily care of your teeth and gums. Regular physical activity. Eating a healthy diet. Avoiding tobacco and drug use. Limiting alcohol use. Practicing safe sex. Taking low doses of aspirin every day. Taking vitamin and mineral supplements as recommended by your health care provider. What happens during an annual well check? The services and screenings done by your health care provider during your annual well check will depend on your age, overall health, lifestyle risk factors, and family history of disease. Counseling  Your health care provider may ask you questions about  your: Alcohol use. Tobacco use. Drug use. Emotional well-being. Home and relationship well-being. Sexual activity. Eating habits. History of falls. Memory and ability to understand (cognition). Work and work Statistician. Screening  You may have the following tests or measurements: Height, weight, and BMI. Blood pressure. Lipid and cholesterol levels. These may be checked every 5 years, or more frequently if you are over 53 years old. Skin check. Lung cancer screening. You may have this screening every year starting at age 66 if you have a 30-pack-year history of smoking and currently smoke or have quit within the past 15 years. Fecal occult blood test (FOBT) of the stool. You may have this test every year starting at age 88. Flexible sigmoidoscopy or colonoscopy. You may have a sigmoidoscopy every 5 years or a colonoscopy every 10 years starting at age 44. Prostate cancer screening. Recommendations will vary depending on your family history and other risks. Hepatitis C blood test. Hepatitis B blood test. Sexually transmitted disease (STD) testing. Diabetes screening. This is done by checking your blood sugar (glucose) after you have not eaten for a while (fasting). You may have this done every 1-3 years. Abdominal aortic aneurysm (AAA) screening. You may need this if you are a current or former smoker. Osteoporosis. You may be screened starting at age 20 if you are at high risk. Talk with your health care provider about your test results, treatment options, and if necessary, the need for more tests. Vaccines  Your health care provider may recommend certain vaccines, such as: Influenza vaccine. This is recommended every year. Tetanus, diphtheria, and acellular pertussis (Tdap, Td) vaccine. You may need a Td booster every 10 years. Zoster vaccine. You may need this after age 3. Pneumococcal 13-valent conjugate (PCV13) vaccine. One dose is recommended  after age 21. Pneumococcal  polysaccharide (PPSV23) vaccine. One dose is recommended after age 42. Talk to your health care provider about which screenings and vaccines you need and how often you need them. This information is not intended to replace advice given to you by your health care provider. Make sure you discuss any questions you have with your health care provider. Document Released: 01/13/2016 Document Revised: 09/05/2016 Document Reviewed: 10/18/2015 Elsevier Interactive Patient Education  2017 [place] Prevention in the Home Falls can cause injuries. They can happen to people of all ages. There are many things you can do to make your home safe and to help prevent falls. What can I do on the outside of my home? Regularly fix the edges of walkways and driveways and fix any cracks. Remove anything that might make you trip as you walk through a door, such as a raised step or threshold. Trim any bushes or trees on the path to your home. Use bright outdoor lighting. Clear any walking paths of anything that might make someone trip, such as rocks or tools. Regularly check to see if handrails are loose or broken. Make sure that both sides of any steps have handrails. Any raised decks and porches should have guardrails on the edges. Have any leaves, snow, or ice cleared regularly. Use sand or salt on walking paths during winter. Clean up any spills in your garage right away. This includes oil or grease spills. What can I do in the bathroom? Use night lights. Install grab bars by the toilet and in the tub and shower. Do not use towel bars as grab bars. Use non-skid mats or decals in the tub or shower. If you need to sit down in the shower, use a plastic, non-slip stool. Keep the floor dry. Clean up any water that spills on the floor as soon as it happens. Remove soap buildup in the tub or shower regularly. Attach bath mats securely with double-sided non-slip rug tape. Do not have throw rugs and other  things on the floor that can make you trip. What can I do in the bedroom? Use night lights. Make sure that you have a light by your bed that is easy to reach. Do not use any sheets or blankets that are too big for your bed. They should not hang down onto the floor. Have a firm chair that has side arms. You can use this for support while you get dressed. Do not have throw rugs and other things on the floor that can make you trip. What can I do in the kitchen? Clean up any spills right away. Avoid walking on wet floors. Keep items that you use a lot in easy-to-reach places. If you need to reach something above you, use a strong step stool that has a grab bar. Keep electrical cords out of the way. Do not use floor polish or wax that makes floors slippery. If you must use wax, use non-skid floor wax. Do not have throw rugs and other things on the floor that can make you trip. What can I do with my stairs? Do not leave any items on the stairs. Make sure that there are handrails on both sides of the stairs and use them. Fix handrails that are broken or loose. Make sure that handrails are as long as the stairways. Check any carpeting to make sure that it is firmly attached to the stairs. Fix any carpet that is loose or worn. Avoid having throw  rugs at the top or bottom of the stairs. If you do have throw rugs, attach them to the floor with carpet tape. Make sure that you have a light switch at the top of the stairs and the bottom of the stairs. If you do not have them, ask someone to add them for you. What else can I do to help prevent falls? Wear shoes that: Do not have high heels. Have rubber bottoms. Are comfortable and fit you well. Are closed at the toe. Do not wear sandals. If you use a stepladder: Make sure that it is fully opened. Do not climb a closed stepladder. Make sure that both sides of the stepladder are locked into place. Ask someone to hold it for you, if possible. Clearly  mark and make sure that you can see: Any grab bars or handrails. First and last steps. Where the edge of each step is. Use tools that help you move around (mobility aids) if they are needed. These include: Canes. Walkers. Scooters. Crutches. Turn on the lights when you go into a dark area. Replace any light bulbs as soon as they burn out. Set up your furniture so you have a clear path. Avoid moving your furniture around. If any of your floors are uneven, fix them. If there are any pets around you, be aware of where they are. Review your medicines with your doctor. Some medicines can make you feel dizzy. This can increase your chance of falling. Ask your doctor what other things that you can do to help prevent falls. This information is not intended to replace advice given to you by your health care provider. Make sure you discuss any questions you have with your health care provider. Document Released: 10/13/2009 Document Revised: 05/24/2016 Document Reviewed: 01/21/2015 Elsevier Interactive Patient Education  2017 [employer].

## 2021-09-29 ENCOUNTER — Other Ambulatory Visit: Payer: Self-pay | Admitting: Internal Medicine

## 2021-10-02 ENCOUNTER — Inpatient Hospital Stay: Payer: Medicare Other | Attending: Internal Medicine

## 2021-10-02 DIAGNOSIS — I82512 Chronic embolism and thrombosis of left femoral vein: Secondary | ICD-10-CM | POA: Diagnosis not present

## 2021-10-02 LAB — PROTIME-INR
INR: 3.1 — ABNORMAL HIGH (ref 0.8–1.2)
Prothrombin Time: 31.8 seconds — ABNORMAL HIGH (ref 11.4–15.2)

## 2021-10-09 ENCOUNTER — Other Ambulatory Visit: Payer: Self-pay | Admitting: Family Medicine

## 2021-10-10 NOTE — Telephone Encounter (Signed)
Requested medications are due for refill today no  Requested medications are on the active medication list yes  Last refill 10/02/21  Last visit 04/18/21  Future visit scheduled 10/19/21  Notes to clinic requesting early, please assess.

## 2021-10-19 ENCOUNTER — Ambulatory Visit: Payer: Medicare Other | Admitting: Family Medicine

## 2021-10-19 ENCOUNTER — Other Ambulatory Visit: Payer: Self-pay

## 2021-10-19 VITALS — BP 110/69 | HR 69 | Ht 66.0 in | Wt 170.0 lb

## 2021-10-19 DIAGNOSIS — R413 Other amnesia: Secondary | ICD-10-CM | POA: Diagnosis not present

## 2021-10-19 DIAGNOSIS — N184 Chronic kidney disease, stage 4 (severe): Secondary | ICD-10-CM | POA: Diagnosis not present

## 2021-10-19 DIAGNOSIS — Z23 Encounter for immunization: Secondary | ICD-10-CM

## 2021-10-19 DIAGNOSIS — I129 Hypertensive chronic kidney disease with stage 1 through stage 4 chronic kidney disease, or unspecified chronic kidney disease: Secondary | ICD-10-CM

## 2021-10-19 DIAGNOSIS — Z136 Encounter for screening for cardiovascular disorders: Secondary | ICD-10-CM

## 2021-10-19 DIAGNOSIS — D692 Other nonthrombocytopenic purpura: Secondary | ICD-10-CM

## 2021-10-19 DIAGNOSIS — Z0000 Encounter for general adult medical examination without abnormal findings: Secondary | ICD-10-CM

## 2021-10-19 DIAGNOSIS — D6869 Other thrombophilia: Secondary | ICD-10-CM

## 2021-10-19 LAB — MICROSCOPIC EXAMINATION
Bacteria, UA: NONE SEEN
Epithelial Cells (non renal): NONE SEEN /hpf (ref 0–10)
WBC, UA: NONE SEEN /hpf (ref 0–5)

## 2021-10-19 LAB — URINALYSIS, ROUTINE W REFLEX MICROSCOPIC
Bilirubin, UA: NEGATIVE
Glucose, UA: NEGATIVE
Leukocytes,UA: NEGATIVE
Nitrite, UA: NEGATIVE
Specific Gravity, UA: 1.025 (ref 1.005–1.030)
Urobilinogen, Ur: 0.2 mg/dL (ref 0.2–1.0)
pH, UA: 5 (ref 5.0–7.5)

## 2021-10-19 LAB — MICROALBUMIN, URINE WAIVED
Creatinine, Urine Waived: 300 mg/dL (ref 10–300)
Microalb, Ur Waived: 150 mg/L — ABNORMAL HIGH (ref 0–19)

## 2021-10-19 MED ORDER — ENALAPRIL MALEATE 20 MG PO TABS: 20.0000 mg | ORAL_TABLET | Freq: Two times a day (BID) | ORAL | 1 refills | Status: DC

## 2021-10-19 MED ORDER — AMLODIPINE BESYLATE 10 MG PO TABS: 10.0000 mg | ORAL_TABLET | Freq: Every day | ORAL | 1 refills | Status: DC

## 2021-10-19 MED ORDER — DONEPEZIL HCL 5 MG PO TABS: 5.0000 mg | ORAL_TABLET | Freq: Every day | ORAL | 1 refills | Status: DC

## 2021-10-19 NOTE — Progress Notes (Signed)
BP 110/69   Pulse 69   Ht 5\' 6"  (1.676 m)   Wt 170 lb (77.1 kg)   BMI 27.44 kg/m    Subjective:    Patient ID: Gregg Thomas, male    DOB: Mar 12, 1931, 87 y.o.   MRN: [id number]  HPI: Gregg Thomas is a 87 y.o. male presenting on 10/19/2021 for comprehensive medical examination. Current medical complaints include:  HYPERTENSION Hypertension status: controlled  Satisfied with current treatment? yes Duration of hypertension: chronic BP monitoring frequency:  not checking BP medication side effects:  no Medication compliance: excellent compliance Previous BP meds: amlodipine, enalapril Aspirin: yes Recurrent headaches: no Visual changes: yes Palpitations: no Dyspnea: no Chest pain: no Lower extremity edema: no Dizzy/lightheaded: no  Interim Problems from his last visit: no  Depression Screen done today and results listed below:  Depression screen [facility] 2/9 09/22/2021 09/20/2020 09/19/2020 09/09/2019 02/03/2019  Decreased Interest 0 0 0 0 0  Down, Depressed, Hopeless 0 0 0 0 0  PHQ - 2 Score 0 0 0 0 0  Altered sleeping - 0 - - 0  Tired, decreased energy - 1 - - 0  Change in appetite - 0 - - 0  Feeling bad or failure about yourself  - 0 - - 0  Trouble concentrating - 0 - - 0  Moving slowly or fidgety/restless - 0 - - 0  Suicidal thoughts - 0 - - 0  PHQ-9 Score - 1 - - 0  Difficult doing work/chores - Not difficult at all - - Not difficult at all    Past Medical History:  Past Medical History:  Diagnosis Date   Chronic kidney disease    Clotting disorder (HCC)    Heart murmur    Skin cancer     Surgical History:  Past Surgical History:  Procedure Laterality Date   biopsy Right 08/26/2019   temple biopsy - dr.dasher    EYE SURGERY Right    benign growth on right eye    KNEE SURGERY Right    MELANOMA EXCISION Bilateral    lower back     Medications:  Current Outpatient Medications on File Prior to Visit  Medication Sig   Cholecalciferol  (VITAMIN D3 PO) Take 1 tablet by mouth daily.   Cyanocobalamin (VITAMIN B-12 ER PO) Take by mouth daily at 6 (six) AM.   ELDERBERRY PO Take 1 tablet by mouth daily.   ERIVEDGE 150 MG capsule Take 150 mg by mouth daily.   Grape Seed Extract 50 MG CAPS Take 1,300 mg by mouth daily.    warfarin (COUMADIN) 5 MG tablet Take 1 tablet (5 mg total) by mouth one time only at 6 PM.   cetirizine (ZYRTEC) 5 MG tablet Take 1 tablet (5 mg total) by mouth daily.   fluticasone (FLONASE) 50 MCG/ACT nasal spray Place 2 sprays into both nostrils daily.   No current facility-administered medications on file prior to visit.    Allergies:  No Known Allergies  Social History:  Social History   Socioeconomic History   Marital status: Married    Spouse name: Not on file   Number of children: Not on file   Years of education: 13 years    Highest education level: Some college, no degree  Occupational History   Occupation: retired  Tobacco Use   Smoking status: Never   Smokeless tobacco: Never  Vaping Use   Vaping Use: Never used  Substance and Sexual Activity   Alcohol use: No  Drug use: No   Sexual activity: Not on file  Other Topics Concern   Not on file  Social History Narrative   [place] 2 times a week, alk 3 times a week    Social Determinants of Radio broadcast assistant Strain: Low Risk    Difficulty of Paying Living Expenses: Not hard at all  Food Insecurity: No Food Insecurity   Worried About Charity fundraiser in the Last Year: Never true   Arboriculturist in the Last Year: Never true  Transportation Needs: No Transportation Needs   Lack of Transportation (Medical): No   Lack of Transportation (Non-Medical): No  Physical Activity: Sufficiently Active   Days of Exercise per Week: 5 days   Minutes of Exercise per Session: 30 min  Stress: No Stress Concern Present   Feeling of Stress : Not at all  Social Connections:  Not on file  Intimate Partner Violence: Not on file   Social History   Tobacco Use  Smoking Status Never  Smokeless Tobacco Never   Social History   Substance and Sexual Activity  Alcohol Use No    Family History:  Family History  Problem Relation Age of Onset   Stroke Mother    Cerebral aneurysm Father     Past medical history, surgical history, medications, allergies, family history and social history reviewed with patient today and changes made to appropriate areas of the chart.   Review of Systems  Constitutional: Negative.   HENT: Negative.    Eyes:  Positive for blurred vision. Negative for double vision, photophobia, pain, discharge and redness.  Respiratory: Negative.    Cardiovascular: Negative.   Gastrointestinal: Negative.   Genitourinary: Negative.   Musculoskeletal: Negative.   Skin: Negative.   Neurological: Negative.   Endo/Heme/Allergies: Negative.   Psychiatric/Behavioral:  Positive for memory loss. Negative for depression, hallucinations, substance abuse and suicidal ideas. The patient has insomnia. The patient is not nervous/anxious.   All other ROS negative except what is listed above and in the HPI.      Objective:    BP 110/69   Pulse 69   Ht 5' 6" (1.676 m)   Wt 170 lb (77.1 kg)   BMI 27.44 kg/m   Wt Readings from Last 3 Encounters:  10/19/21 170 lb (77.1 kg)  09/22/21 170 lb (77.1 kg)  05/01/21 166 lb (75.3 kg)    Physical Exam Vitals and nursing note reviewed.  Constitutional:      General: He is not in acute distress.    Appearance: Normal appearance. He is normal weight. He is not ill-appearing, toxic-appearing or diaphoretic.  HENT:     Head: Normocephalic and atraumatic.     Right Ear: Tympanic membrane, ear canal and external ear normal. There is no impacted cerumen.     Left Ear: Tympanic membrane, ear canal and external ear normal. There is no impacted cerumen.     Nose: Nose normal. No congestion or rhinorrhea.      Mouth/Throat:     Mouth: Mucous membranes are moist.     Pharynx: Oropharynx is clear. No oropharyngeal exudate or posterior oropharyngeal erythema.  Eyes:     General: No scleral icterus.       Right eye: No discharge.        Left eye: No discharge.     Extraocular Movements: Extraocular movements intact.  Conjunctiva/sclera: Conjunctivae normal.     Pupils: Pupils are equal, round, and reactive to light.  Neck:     Vascular: No carotid bruit.  Cardiovascular:     Rate and Rhythm: Normal rate and regular rhythm.     Pulses: Normal pulses.     Heart sounds: No murmur heard.   No friction rub. No gallop.  Pulmonary:     Effort: Pulmonary effort is normal. No respiratory distress.     Breath sounds: Normal breath sounds. No stridor. No wheezing, rhonchi or rales.  Chest:     Chest wall: No tenderness.  Abdominal:     General: Abdomen is flat. Bowel sounds are normal. There is no distension.     Palpations: Abdomen is soft. There is no mass.     Tenderness: There is no abdominal tenderness. There is no right CVA tenderness, left CVA tenderness, guarding or rebound.     Hernia: No hernia is present.  Genitourinary:    Comments: Genital exam deferred with shared decision making Musculoskeletal:        General: No swelling, tenderness, deformity or signs of injury.     Cervical back: Normal range of motion and neck supple. No rigidity. No muscular tenderness.     Right lower leg: No edema.     Left lower leg: No edema.  Lymphadenopathy:     Cervical: No cervical adenopathy.  Skin:    General: Skin is warm and dry.     Capillary Refill: Capillary refill takes less than 2 seconds.     Coloration: Skin is not jaundiced or pale.     Findings: No bruising, erythema, lesion or rash.  Neurological:     General: No focal deficit present.     Mental Status: He is alert and oriented to person, place, and time.     Cranial Nerves: No cranial nerve deficit.     Sensory: No sensory  deficit.     Motor: No weakness.     Coordination: Coordination normal.     Gait: Gait normal.     Deep Tendon Reflexes: Reflexes normal.  Psychiatric:        Mood and Affect: Mood normal.        Behavior: Behavior normal.        Thought Content: Thought content normal.        Judgment: Judgment normal.    Results for orders placed or performed in visit on 10/19/21  Microscopic Examination   Urine  Result Value Ref Range   WBC, UA None seen 0 - 5 /hpf   RBC 0-2 0 - 2 /hpf   Epithelial Cells (non renal) None seen 0 - 10 /hpf   Casts Present (A) None seen /lpf   Cast Type Hyaline casts N/A   Mucus, UA Present (A) Not Estab.   Bacteria, UA None seen None seen/Few  Comprehensive metabolic panel  Result Value Ref Range   Glucose 63 (L) 70 - 99 mg/dL   BUN 20 8 - 27 mg/dL   Creatinine, Ser 1.91 (H) 0.76 - 1.27 mg/dL   eGFR 33 (L) >59 mL/min/1.73   BUN/Creatinine Ratio 10 10 - 24   Sodium 143 134 - 144 mmol/L   Potassium 4.7 3.5 - 5.2 mmol/L   Chloride 106 96 - 106 mmol/L   CO2 23 20 - 29 mmol/L   Calcium 9.9 8.6 - 10.2 mg/dL   Total Protein 6.9 6.0 - 8.5 g/dL   Albumin 4.2 3.6 - 4.6  g/dL   Globulin, Total 2.7 1.5 - 4.5 g/dL   Albumin/Globulin Ratio 1.6 1.2 - 2.2   Bilirubin Total 0.7 0.0 - 1.2 mg/dL   Alkaline Phosphatase 105 44 - 121 IU/L   AST 24 0 - 40 IU/L   ALT 18 0 - 44 IU/L  CBC with Differential/Platelet  Result Value Ref Range   WBC 6.5 3.4 - 10.8 x10E3/uL   RBC 4.63 4.14 - 5.80 x10E6/uL   Hemoglobin 15.3 13.0 - 17.7 g/dL   Hematocrit 44.5 37.5 - 51.0 %   MCV 96 79 - 97 fL   MCH 33.0 26.6 - 33.0 pg   MCHC 34.4 31.5 - 35.7 g/dL   RDW 12.5 11.6 - 15.4 %   Platelets 221 150 - 450 x10E3/uL   Neutrophils 64 Not Estab. %   Lymphs 22 Not Estab. %   Monocytes 11 Not Estab. %   Eos 2 Not Estab. %   Basos 1 Not Estab. %   Neutrophils Absolute 4.2 1.4 - 7.0 x10E3/uL   Lymphocytes Absolute 1.4 0.7 - 3.1 x10E3/uL   Monocytes Absolute 0.7 0.1 - 0.9 x10E3/uL   EOS  (ABSOLUTE) 0.1 0.0 - 0.4 x10E3/uL   Basophils Absolute 0.1 0.0 - 0.2 x10E3/uL   Immature Granulocytes 0 Not Estab. %   Immature Grans (Abs) 0.0 0.0 - 0.1 x10E3/uL  Lipid Panel w/o Chol/HDL Ratio  Result Value Ref Range   Cholesterol, Total 157 100 - 199 mg/dL   Triglycerides 108 0 - 149 mg/dL   HDL 43 >39 mg/dL   VLDL Cholesterol Cal 20 5 - 40 mg/dL   LDL Chol Calc (NIH) 94 0 - 99 mg/dL  TSH  Result Value Ref Range   TSH 2.600 0.450 - 4.500 uIU/mL  Urinalysis, Routine w reflex microscopic  Result Value Ref Range   Specific Gravity, UA 1.025 1.005 - 1.030   pH, UA 5.0 5.0 - 7.5   Color, UA Yellow Yellow   Appearance Ur Clear Clear   Leukocytes,UA Negative Negative   Protein,UA 2+ (A) Negative/Trace   Glucose, UA Negative Negative   Ketones, UA 1+ (A) Negative   RBC, UA Trace (A) Negative   Bilirubin, UA Negative Negative   Urobilinogen, Ur 0.2 0.2 - 1.0 mg/dL   Nitrite, UA Negative Negative   Microscopic Examination See below:   Microalbumin, Urine Waived  Result Value Ref Range   Microalb, Ur Waived 150 (H) 0 - 19 mg/L   Creatinine, Urine Waived 300 10 - 300 mg/dL   Microalb/Creat Ratio 30-300 (H) <30 mg/g      Assessment & Plan:   Problem List Items Addressed This Visit       Cardiovascular and Mediastinum   Senile purpura ([place])    Reassured patient. Continue to monitor.       Relevant Medications   enalapril (VASOTEC) 20 MG tablet   amLODipine (NORVASC) 5 MG tablet     Genitourinary   CKD (chronic kidney disease) stage 4, GFR 15-29 ml/min (HCC)    Rechecking labs today. Await results. Treat as needed.       Relevant Orders   Comprehensive metabolic panel (Completed)   CBC with Differential/Platelet (Completed)   Urinalysis, Routine w reflex microscopic (Completed)   Benign hypertensive renal disease    Under good control on current regimen. Continue current regimen. Continue to monitor. Call with any concerns. Refills given. Labs drawn today.        Relevant Orders   Comprehensive metabolic panel (Completed)  CBC with Differential/Platelet (Completed)   TSH (Completed)   Microalbumin, Urine Waived (Completed)     Hematopoietic and Hemostatic   Acquired thrombophilia ([place])    Checking labs today. Continue current regimen. Call with any concerns.       Relevant Orders   Comprehensive metabolic panel (Completed)   CBC with Differential/Platelet (Completed)     Other   Memory loss    Discussed that his aricept will not improve his memory, but will hopefully prevent worsening issues with his memory. Tolerating it well. Continue to monitor. Call with any concerns.      Relevant Orders   Comprehensive metabolic panel (Completed)   CBC with Differential/Platelet (Completed)   Other Visit Diagnoses     Routine general medical examination at a health care facility    -  Primary   Vaccines up to date. Screening labs checked today. Continue diet and exercise. Call with any concerns. Continue to monitor.    Screening for cardiovascular condition       Labs drawn today. Await results. Treat as needed.    Relevant Orders   Lipid Panel w/o Chol/HDL Ratio (Completed)       LABORATORY TESTING:  Health maintenance labs ordered today as discussed above.   IMMUNIZATIONS:   - Tdap: Tetanus vaccination status reviewed: last tetanus booster within 10 years. - Influenza: Up to date - Pneumovax: Up to date - Prevnar: Administered today - COVID: Up to date - Shingrix vaccine: Not applicable  SCREENING: - Colonoscopy: Not applicable  Discussed with patient purpose of the colonoscopy is to detect colon cancer at curable precancerous or early stages   PATIENT COUNSELING:    Sexuality: Discussed sexually transmitted diseases, partner selection, use of condoms, avoidance of unintended pregnancy  and contraceptive alternatives.   Advised to avoid cigarette smoking.  I discussed with the patient that most people either abstain from  alcohol or drink within safe limits (<=14/week and <=4 drinks/occasion for males, <=7/weeks and <= 3 drinks/occasion for females) and that the risk for alcohol disorders and other health effects rises proportionally with the number of drinks per week and how often a drinker exceeds daily limits.  Discussed cessation/primary prevention of drug use and availability of treatment for abuse.   Diet: Encouraged to adjust caloric intake to maintain  or achieve ideal body weight, to reduce intake of dietary saturated fat and total fat, to limit sodium intake by avoiding high sodium foods and not adding table salt, and to maintain adequate dietary potassium and calcium preferably from fresh fruits, vegetables, and low-fat dairy products.    stressed the importance of regular exercise  Injury prevention: Discussed safety belts, safety helmets, smoke detector, smoking near bedding or upholstery.   Dental health: Discussed importance of regular tooth brushing, flossing, and dental visits.   Follow up plan: NEXT PREVENTATIVE PHYSICAL DUE IN 1 YEAR. Return in about 6 months (around 04/19/2022).

## 2021-10-20 ENCOUNTER — Telehealth: Payer: Self-pay

## 2021-10-20 LAB — COMPREHENSIVE METABOLIC PANEL
ALT: 18 IU/L (ref 0–44)
AST: 24 IU/L (ref 0–40)
Albumin/Globulin Ratio: 1.6 (ref 1.2–2.2)
Albumin: 4.2 g/dL (ref 3.6–4.6)
Alkaline Phosphatase: 105 IU/L (ref 44–121)
BUN/Creatinine Ratio: 10 (ref 10–24)
BUN: 20 mg/dL (ref 8–27)
Bilirubin Total: 0.7 mg/dL (ref 0.0–1.2)
CO2: 23 mmol/L (ref 20–29)
Calcium: 9.9 mg/dL (ref 8.6–10.2)
Chloride: 106 mmol/L (ref 96–106)
Creatinine, Ser: 1.91 mg/dL — ABNORMAL HIGH (ref 0.76–1.27)
Globulin, Total: 2.7 g/dL (ref 1.5–4.5)
Glucose: 63 mg/dL — ABNORMAL LOW (ref 70–99)
Potassium: 4.7 mmol/L (ref 3.5–5.2)
Sodium: 143 mmol/L (ref 134–144)
Total Protein: 6.9 g/dL (ref 6.0–8.5)
eGFR: 33 mL/min/{1.73_m2} — ABNORMAL LOW (ref >59)

## 2021-10-20 LAB — CBC WITH DIFFERENTIAL/PLATELET
Basophils Absolute: 0.1 10*3/uL (ref 0.0–0.2)
Basos: 1 %
EOS (ABSOLUTE): 0.1 10*3/uL (ref 0.0–0.4)
Eos: 2 %
Hematocrit: 44.5 % (ref 37.5–51.0)
Hemoglobin: 15.3 g/dL (ref 13.0–17.7)
Immature Grans (Abs): 0 10*3/uL (ref 0.0–0.1)
Immature Granulocytes: 0 %
Lymphocytes Absolute: 1.4 10*3/uL (ref 0.7–3.1)
Lymphs: 22 %
MCH: 33 pg (ref 26.6–33.0)
MCHC: 34.4 g/dL (ref 31.5–35.7)
MCV: 96 fL (ref 79–97)
Monocytes Absolute: 0.7 10*3/uL (ref 0.1–0.9)
Monocytes: 11 %
Neutrophils Absolute: 4.2 10*3/uL (ref 1.4–7.0)
Neutrophils: 64 %
Platelets: 221 10*3/uL (ref 150–450)
RBC: 4.63 x10E6/uL (ref 4.14–5.80)
RDW: 12.5 % (ref 11.6–15.4)
WBC: 6.5 10*3/uL (ref 3.4–10.8)

## 2021-10-20 LAB — LIPID PANEL W/O CHOL/HDL RATIO
Cholesterol, Total: 157 mg/dL (ref 100–199)
HDL: 43 mg/dL (ref >39)
LDL Chol Calc (NIH): 94 mg/dL (ref 0–99)
Triglycerides: 108 mg/dL (ref 0–149)
VLDL Cholesterol Cal: 20 mg/dL (ref 5–40)

## 2021-10-20 LAB — TSH: TSH: 2.6 u[IU]/mL (ref 0.450–4.500)

## 2021-10-20 MED ORDER — AMLODIPINE BESYLATE 5 MG PO TABS: 5.0000 mg | ORAL_TABLET | Freq: Every day | ORAL | 1 refills | Status: DC

## 2021-10-20 NOTE — Telephone Encounter (Signed)
Page from [facility] called about patient's Amlodipine. She stated that they have been filling Amlodipine 5 mg since April. Was fill prescription today and it is now 10 mg. They wanted to make sure that this was the correct dose for patient.

## 2021-10-20 NOTE — Telephone Encounter (Signed)
They told me that they had been taking 10mg . If he has been taking 5mg , I want him to continue to take 5. Thanks.

## 2021-10-20 NOTE — Telephone Encounter (Signed)
Spoke with Page from [employer] and notified her that per Dr.Johnson would like for patient to continue the 5 mg dosage of Amlodipine instead of the 10 mg. Page verbalized understanding and has no further questions.

## 2021-10-27 ENCOUNTER — Encounter: Payer: Self-pay | Admitting: Family Medicine

## 2021-10-28 ENCOUNTER — Encounter: Payer: Self-pay | Admitting: Family Medicine

## 2021-10-28 DIAGNOSIS — D692 Other nonthrombocytopenic purpura: Secondary | ICD-10-CM | POA: Insufficient documentation

## 2021-10-28 NOTE — Assessment & Plan Note (Signed)
Rechecking labs today. Await results. Treat as needed.  °

## 2021-10-28 NOTE — Assessment & Plan Note (Signed)
Reassured patient. Continue to monitor.  

## 2021-10-28 NOTE — Assessment & Plan Note (Signed)
Under good control on current regimen. Continue current regimen. Continue to monitor. Call with any concerns. Refills given. Labs drawn today.   

## 2021-10-28 NOTE — Assessment & Plan Note (Signed)
Checking labs today. Continue current regimen. Call with any concerns.

## 2021-10-28 NOTE — Assessment & Plan Note (Signed)
Discussed that his aricept will not improve his memory, but will hopefully prevent worsening issues with his memory. Tolerating it well. Continue to monitor. Call with any concerns.

## 2021-10-30 ENCOUNTER — Emergency Department: Payer: Medicare Other

## 2021-10-30 ENCOUNTER — Other Ambulatory Visit: Payer: Self-pay

## 2021-10-30 ENCOUNTER — Encounter: Payer: Self-pay | Admitting: Radiology

## 2021-10-30 DIAGNOSIS — I2699 Other pulmonary embolism without acute cor pulmonale: Secondary | ICD-10-CM | POA: Diagnosis present

## 2021-10-30 DIAGNOSIS — I82512 Chronic embolism and thrombosis of left femoral vein: Secondary | ICD-10-CM | POA: Diagnosis present

## 2021-10-30 DIAGNOSIS — U071 COVID-19: Secondary | ICD-10-CM | POA: Diagnosis not present

## 2021-10-30 DIAGNOSIS — Z85820 Personal history of malignant melanoma of skin: Secondary | ICD-10-CM

## 2021-10-30 DIAGNOSIS — R531 Weakness: Secondary | ICD-10-CM | POA: Diagnosis not present

## 2021-10-30 DIAGNOSIS — Z7901 Long term (current) use of anticoagulants: Secondary | ICD-10-CM

## 2021-10-30 DIAGNOSIS — J1282 Pneumonia due to coronavirus disease 2019: Secondary | ICD-10-CM | POA: Diagnosis present

## 2021-10-30 DIAGNOSIS — Z79899 Other long term (current) drug therapy: Secondary | ICD-10-CM

## 2021-10-30 DIAGNOSIS — D689 Coagulation defect, unspecified: Secondary | ICD-10-CM | POA: Diagnosis present

## 2021-10-30 DIAGNOSIS — D692 Other nonthrombocytopenic purpura: Secondary | ICD-10-CM | POA: Diagnosis present

## 2021-10-30 DIAGNOSIS — J9601 Acute respiratory failure with hypoxia: Secondary | ICD-10-CM | POA: Diagnosis present

## 2021-10-30 DIAGNOSIS — A0839 Other viral enteritis: Secondary | ICD-10-CM | POA: Diagnosis present

## 2021-10-30 DIAGNOSIS — N184 Chronic kidney disease, stage 4 (severe): Secondary | ICD-10-CM | POA: Diagnosis present

## 2021-10-30 LAB — URINALYSIS, COMPLETE (UACMP) WITH MICROSCOPIC
Bacteria, UA: NONE SEEN
Bilirubin Urine: NEGATIVE
Glucose, UA: 50 mg/dL — AB
Ketones, ur: NEGATIVE mg/dL
Leukocytes,Ua: NEGATIVE
Nitrite: NEGATIVE
Protein, ur: 100 mg/dL — AB
Specific Gravity, Urine: 1.019 (ref 1.005–1.030)
pH: 5 (ref 5.0–8.0)

## 2021-10-30 LAB — CBC
HCT: 43.4 % (ref 39.0–52.0)
Hemoglobin: 15.3 g/dL (ref 13.0–17.0)
MCH: 34.9 pg — ABNORMAL HIGH (ref 26.0–34.0)
MCHC: 35.3 g/dL (ref 30.0–36.0)
MCV: 99.1 fL (ref 80.0–100.0)
Platelets: 154 10*3/uL (ref 150–400)
RBC: 4.38 MIL/uL (ref 4.22–5.81)
RDW: 13.4 % (ref 11.5–15.5)
WBC: 9.2 10*3/uL (ref 4.0–10.5)
nRBC: 0 % (ref 0.0–0.2)

## 2021-10-30 LAB — COMPREHENSIVE METABOLIC PANEL
ALT: 25 U/L (ref 0–44)
AST: 34 U/L (ref 15–41)
Albumin: 3.9 g/dL (ref 3.5–5.0)
Alkaline Phosphatase: 97 U/L (ref 38–126)
Anion gap: 7 (ref 5–15)
BUN: 22 mg/dL (ref 8–23)
CO2: 24 mmol/L (ref 22–32)
Calcium: 9.2 mg/dL (ref 8.9–10.3)
Chloride: 106 mmol/L (ref 98–111)
Creatinine, Ser: 1.93 mg/dL — ABNORMAL HIGH (ref 0.61–1.24)
GFR, Estimated: 33 mL/min — ABNORMAL LOW (ref >60)
Glucose, Bld: 108 mg/dL — ABNORMAL HIGH (ref 70–99)
Potassium: 4.3 mmol/L (ref 3.5–5.1)
Sodium: 137 mmol/L (ref 135–145)
Total Bilirubin: 1.2 mg/dL (ref 0.3–1.2)
Total Protein: 7.6 g/dL (ref 6.5–8.1)

## 2021-10-30 LAB — PROTIME-INR
INR: 1.9 — ABNORMAL HIGH (ref 0.8–1.2)
Prothrombin Time: 21.5 seconds — ABNORMAL HIGH (ref 11.4–15.2)

## 2021-10-30 LAB — TROPONIN I (HIGH SENSITIVITY): Troponin I (High Sensitivity): 67 ng/L — ABNORMAL HIGH (ref <18)

## 2021-10-30 LAB — LACTIC ACID, PLASMA: Lactic Acid, Venous: 1.2 mmol/L (ref 0.5–1.9)

## 2021-10-30 MED ORDER — ACETAMINOPHEN 325 MG PO TABS
650.0000 mg | ORAL_TABLET | Freq: Once | ORAL | Status: AC
  Administered 2021-10-30: 650 mg via ORAL
  Filled 2021-10-30: qty 2

## 2021-10-30 NOTE — ED Triage Notes (Signed)
Pt in with co fever and cough since yesterday. Also has generalized weakness and diarrhea since yesterday. Also has been confused per wife and has had increased urination.

## 2021-10-30 NOTE — ED Provider Notes (Signed)
Emergency Medicine Provider Triage Evaluation Note  Gregg Thomas , a 87 y.o. male  was evaluated in triage.  Pt complains of generalized weakness and fever for the past 24 hours associated with nonproductive cough and increased urination.  He has not had any sick contacts, has had diarrhea, but denies any abdominal pain, vomiting, chest pain, or shortness of breath.  Review of Systems  Positive: Fever, cough, diarrhea. Negative: Chest pain, shortness of breath, vomiting, abdominal pain, dysuria.  Physical Exam  BP (!) 141/77 (BP Location: Left Arm)   Pulse 88   Temp (!) 103 F (39.4 C) (Oral)   Resp 20   Ht 5\' 7"  (1.702 m)   Wt 77.1 kg   SpO2 93%   BMI 26.63 kg/m  Gen:   Awake, no distress  Resp:  Normal effort, clear to auscultation bilaterally. MSK:   Moves extremities without difficulty, 2+ radial pulses bilaterally. Other:  Abdomen soft and nontender.  Medical Decision Making  Medically screening exam initiated at 9:32 PM.  Appropriate orders placed.  Gregg Thomas was informed that the remainder of the evaluation will be completed by another provider, this initial triage assessment does not replace that evaluation, and the importance of remaining in the ED until their evaluation is complete.    Gregg Divine, MD 10/30/21 2134

## 2021-10-31 ENCOUNTER — Inpatient Hospital Stay
Admission: EM | Admit: 2021-10-31 | Discharge: 2021-11-02 | DRG: 177 | Disposition: A | Discharge status: Home / Self Care | Payer: Medicare Other | Attending: Internal Medicine | Admitting: Internal Medicine

## 2021-10-31 DIAGNOSIS — I82512 Chronic embolism and thrombosis of left femoral vein: Secondary | ICD-10-CM | POA: Diagnosis present

## 2021-10-31 DIAGNOSIS — I2602 Saddle embolus of pulmonary artery with acute cor pulmonale: Secondary | ICD-10-CM | POA: Diagnosis not present

## 2021-10-31 DIAGNOSIS — I2782 Chronic pulmonary embolism: Secondary | ICD-10-CM

## 2021-10-31 DIAGNOSIS — N184 Chronic kidney disease, stage 4 (severe): Secondary | ICD-10-CM | POA: Diagnosis present

## 2021-10-31 DIAGNOSIS — J1282 Pneumonia due to coronavirus disease 2019: Secondary | ICD-10-CM | POA: Diagnosis present

## 2021-10-31 DIAGNOSIS — Z85820 Personal history of malignant melanoma of skin: Secondary | ICD-10-CM | POA: Diagnosis not present

## 2021-10-31 DIAGNOSIS — D689 Coagulation defect, unspecified: Secondary | ICD-10-CM | POA: Diagnosis present

## 2021-10-31 DIAGNOSIS — Z79899 Other long term (current) drug therapy: Secondary | ICD-10-CM | POA: Diagnosis not present

## 2021-10-31 DIAGNOSIS — R531 Weakness: Secondary | ICD-10-CM

## 2021-10-31 DIAGNOSIS — I82409 Acute embolism and thrombosis of unspecified deep veins of unspecified lower extremity: Secondary | ICD-10-CM | POA: Diagnosis present

## 2021-10-31 DIAGNOSIS — A0839 Other viral enteritis: Secondary | ICD-10-CM | POA: Diagnosis present

## 2021-10-31 DIAGNOSIS — R4182 Altered mental status, unspecified: Secondary | ICD-10-CM

## 2021-10-31 DIAGNOSIS — U071 COVID-19: Secondary | ICD-10-CM | POA: Diagnosis present

## 2021-10-31 DIAGNOSIS — C44320 Squamous cell carcinoma of skin of unspecified parts of face: Secondary | ICD-10-CM | POA: Diagnosis present

## 2021-10-31 DIAGNOSIS — J9601 Acute respiratory failure with hypoxia: Secondary | ICD-10-CM | POA: Diagnosis present

## 2021-10-31 DIAGNOSIS — Z7901 Long term (current) use of anticoagulants: Secondary | ICD-10-CM | POA: Diagnosis not present

## 2021-10-31 DIAGNOSIS — I2699 Other pulmonary embolism without acute cor pulmonale: Secondary | ICD-10-CM | POA: Diagnosis present

## 2021-10-31 DIAGNOSIS — D692 Other nonthrombocytopenic purpura: Secondary | ICD-10-CM | POA: Diagnosis present

## 2021-10-31 LAB — CBC WITH DIFFERENTIAL/PLATELET
Abs Immature Granulocytes: 0.05 10*3/uL (ref 0.00–0.07)
Basophils Absolute: 0 10*3/uL (ref 0.0–0.1)
Basophils Relative: 0 %
Eosinophils Absolute: 0 10*3/uL (ref 0.0–0.5)
Eosinophils Relative: 0 %
HCT: 42.3 % (ref 39.0–52.0)
Hemoglobin: 15 g/dL (ref 13.0–17.0)
Immature Granulocytes: 0 %
Lymphocytes Relative: 10 %
Lymphs Abs: 1.2 10*3/uL (ref 0.7–4.0)
MCH: 34.7 pg — ABNORMAL HIGH (ref 26.0–34.0)
MCHC: 35.5 g/dL (ref 30.0–36.0)
MCV: 97.9 fL (ref 80.0–100.0)
Monocytes Absolute: 1.3 10*3/uL — ABNORMAL HIGH (ref 0.1–1.0)
Monocytes Relative: 10 %
Neutro Abs: 10.3 10*3/uL — ABNORMAL HIGH (ref 1.7–7.7)
Neutrophils Relative %: 80 %
Platelets: 154 10*3/uL (ref 150–400)
RBC: 4.32 MIL/uL (ref 4.22–5.81)
RDW: 13.7 % (ref 11.5–15.5)
WBC: 12.9 10*3/uL — ABNORMAL HIGH (ref 4.0–10.5)
nRBC: 0 % (ref 0.0–0.2)

## 2021-10-31 LAB — COMPREHENSIVE METABOLIC PANEL
ALT: 25 U/L (ref 0–44)
AST: 30 U/L (ref 15–41)
Albumin: 3.5 g/dL (ref 3.5–5.0)
Alkaline Phosphatase: 74 U/L (ref 38–126)
Anion gap: 9 (ref 5–15)
BUN: 27 mg/dL — ABNORMAL HIGH (ref 8–23)
CO2: 23 mmol/L (ref 22–32)
Calcium: 8.9 mg/dL (ref 8.9–10.3)
Chloride: 104 mmol/L (ref 98–111)
Creatinine, Ser: 2.11 mg/dL — ABNORMAL HIGH (ref 0.61–1.24)
GFR, Estimated: 30 mL/min — ABNORMAL LOW (ref >60)
Glucose, Bld: 111 mg/dL — ABNORMAL HIGH (ref 70–99)
Potassium: 4.1 mmol/L (ref 3.5–5.1)
Sodium: 136 mmol/L (ref 135–145)
Total Bilirubin: 1.2 mg/dL (ref 0.3–1.2)
Total Protein: 6.7 g/dL (ref 6.5–8.1)

## 2021-10-31 LAB — LACTIC ACID, PLASMA: Lactic Acid, Venous: 1.1 mmol/L (ref 0.5–1.9)

## 2021-10-31 LAB — PROCALCITONIN: Procalcitonin: 0.4 ng/mL

## 2021-10-31 LAB — RESP PANEL BY RT-PCR (FLU A&B, COVID) ARPGX2
Influenza A by PCR: NEGATIVE
Influenza B by PCR: NEGATIVE
SARS Coronavirus 2 by RT PCR: POSITIVE — AB

## 2021-10-31 LAB — HEPATITIS B SURFACE ANTIGEN: Hepatitis B Surface Ag: NONREACTIVE

## 2021-10-31 LAB — TROPONIN I (HIGH SENSITIVITY)
Troponin I (High Sensitivity): 73 ng/L — ABNORMAL HIGH (ref <18)
Troponin I (High Sensitivity): 83 ng/L — ABNORMAL HIGH (ref <18)
Troponin I (High Sensitivity): 89 ng/L — ABNORMAL HIGH (ref <18)

## 2021-10-31 LAB — FERRITIN: Ferritin: 120 ng/mL (ref 24–336)

## 2021-10-31 LAB — PROTIME-INR
INR: 1.9 — ABNORMAL HIGH (ref 0.8–1.2)
Prothrombin Time: 22.2 seconds — ABNORMAL HIGH (ref 11.4–15.2)

## 2021-10-31 LAB — C-REACTIVE PROTEIN: CRP: 5 mg/dL — ABNORMAL HIGH (ref <1.0)

## 2021-10-31 LAB — MAGNESIUM: Magnesium: 1.7 mg/dL (ref 1.7–2.4)

## 2021-10-31 LAB — LACTATE DEHYDROGENASE: LDH: 137 U/L (ref 98–192)

## 2021-10-31 LAB — D-DIMER, QUANTITATIVE: D-Dimer, Quant: 0.35 ug/mL-FEU (ref 0.00–0.50)

## 2021-10-31 LAB — PHOSPHORUS: Phosphorus: 3.3 mg/dL (ref 2.5–4.6)

## 2021-10-31 MED ORDER — HEPARIN SODIUM (PORCINE) 5000 UNIT/ML IJ SOLN: 5000.0000 [IU] | Freq: Three times a day (TID) | INTRAMUSCULAR | Status: DC

## 2021-10-31 MED ORDER — THIAMINE HCL 100 MG/ML IJ SOLN
100.0000 mg | Freq: Every day | INTRAMUSCULAR | Status: DC
  Administered 2021-10-31 – 2021-11-02 (×3): 100 mg via INTRAVENOUS
  Filled 2021-10-31 (×3): qty 2

## 2021-10-31 MED ORDER — WARFARIN SODIUM 7.5 MG PO TABS
7.5000 mg | ORAL_TABLET | Freq: Once | ORAL | Status: AC
  Administered 2021-10-31: 7.5 mg via ORAL
  Filled 2021-10-31: qty 1

## 2021-10-31 MED ORDER — SODIUM CHLORIDE 0.9 % IV SOLN
200.0000 mg | Freq: Once | INTRAVENOUS | Status: AC
  Administered 2021-10-31: 200 mg via INTRAVENOUS
  Filled 2021-10-31: qty 200

## 2021-10-31 MED ORDER — WARFARIN - PHARMACIST DOSING INPATIENT
Freq: Every day | Status: DC
  Filled 2021-10-31: qty 1

## 2021-10-31 MED ORDER — FOLIC ACID 5 MG/ML IJ SOLN
1.0000 mg | Freq: Every day | INTRAMUSCULAR | Status: DC
  Administered 2021-10-31 – 2021-11-02 (×3): 1 mg via INTRAVENOUS
  Filled 2021-10-31 (×4): qty 0.2

## 2021-10-31 MED ORDER — IPRATROPIUM-ALBUTEROL 20-100 MCG/ACT IN AERS
1.0000 | INHALATION_SPRAY | Freq: Four times a day (QID) | RESPIRATORY_TRACT | Status: DC
  Administered 2021-10-31 – 2021-11-02 (×6): 1 via RESPIRATORY_TRACT
  Filled 2021-10-31: qty 4

## 2021-10-31 MED ORDER — METHYLPREDNISOLONE SODIUM SUCC 125 MG IJ SOLR
80.0000 mg | Freq: Every day | INTRAMUSCULAR | Status: DC
  Administered 2021-10-31 – 2021-11-02 (×3): 80 mg via INTRAVENOUS
  Filled 2021-10-31 (×3): qty 2

## 2021-10-31 MED ORDER — SODIUM CHLORIDE 0.9 % IV SOLN
100.0000 mg | Freq: Every day | INTRAVENOUS | Status: DC
  Administered 2021-11-01 – 2021-11-02 (×2): 100 mg via INTRAVENOUS
  Filled 2021-10-31: qty 20
  Filled 2021-10-31: qty 100

## 2021-10-31 NOTE — Consult Note (Signed)
ANTICOAGULATION CONSULT NOTE - Initial Consult  Pharmacy Consult for Warfarin Indication: History of PE/DVT  No Known Allergies  Patient Measurements: Height: 5\' 7"  (170.2 cm) Weight: 77.1 kg (170 lb) IBW/kg (Calculated) : 66.1 Heparin Dosing Weight: 77.1 kg  Vital Signs: Temp: 99.1 F (37.3 C) (11/01 0441) Temp Source: Oral (11/01 0441) BP: 115/60 (11/01 0441) Pulse Rate: 67 (11/01 0441)  Labs: Recent Labs    10/30/21 2127 10/30/21 2128 10/31/21 0101 10/31/21 1010  HGB 15.3  --   --  15.0  HCT 43.4  --   --  42.3  PLT 154  --   --  154  LABPROT  --  21.5*  --   --   INR  --  1.9*  --   --   CREATININE 1.93*  --   --  2.11*  TROPONINIHS 67*  --  73* 83*    Estimated Creatinine Clearance: 22.6 mL/min (A) (by C-G formula based on SCr of 2.11 mg/dL (H)).   Medical History: Past Medical History:  Diagnosis Date   Chronic kidney disease    Clotting disorder ([place])    Heart murmur    Skin cancer     Medications:  AC: Warfarin 5 mg daily  Assessment: Pharmacy has been consulted to dose and monitor Warfarin in 87yo patient with history of PE/DVT(most recent in 11/2019) presenting to the ED with generalized weakness. Patient takes Warfarin 5 mg daily PTA.  Goal of Therapy:  INR 2-3 Monitor platelets by anticoagulation protocol: Yes   Plan:  --11/1: INR 1.9, subtherapeutic --Will order Warfarin 7.5 mg x 1 dose  --recheck INR with AM labs  [facility] A Vonne Mcdanel 10/31/2021,12:50 PM

## 2021-10-31 NOTE — ED Notes (Signed)
Pt and wife wanting to leave, encourage them to stay after speaking with Dr. Beather Arbour regarding lab work, family verbalized understanding and agreed to stay. Advised there were still many people ahead of him but we will continue to monitor him.

## 2021-10-31 NOTE — Consult Note (Signed)
Remdesivir - Pharmacy Brief Note   O:  ALT: 25 CXR: No active cardiopulmonary disease. SpO2: 92% on RA   A/P:  Remdesivir 200 mg IVPB once followed by 100 mg IVPB daily x 4 days.   Darnelle Bos PharmD 10/31/2021 8:57 AM

## 2021-10-31 NOTE — ED Provider Notes (Signed)
[facility] Emergency Department Provider Note  Time seen: 8:46 AM  I have reviewed the triage vital signs and the nursing notes.   HISTORY  Chief Complaint Fever   HPI Gregg Thomas is a 87 y.o. male with a past medical history of CKD, PE/DVT on Coumadin presents to the emergency department for confusion fever and cough.  According to the wife 2 days ago patient began with a cough has had worsening generalized weakness to the point where he is having trouble ambulating and getting out of bed.  Has had diarrhea and last night developed confusion.  Patient was brought in last night to the emergency department for evaluation, prolonged waiting room time.  Wife states patient continues to be intermittently confused.  Patient is satting in the low to mid 90s on room air.  Denies any difficulty breathing.   Past Medical History:  Diagnosis Date   Chronic kidney disease    Clotting disorder ([place])    Heart murmur    Skin cancer     Patient Active Problem List   Diagnosis Date Noted   Senile purpura ([place]) 10/28/2021   Acquired thrombophilia ([place]) 06/20/2020   Memory loss 06/20/2020   Chronic deep vein thrombosis (DVT) of femoral vein of left lower extremity ([place]) 11/23/2019   Benign hypertensive renal disease 02/03/2019   Advanced care planning/counseling discussion 02/03/2019   Hearing loss 02/03/2019   Squamous cell carcinoma, face 10/22/2018   CKD (chronic kidney disease) stage 4, GFR 15-29 ml/min (HCC) 10/22/2018   Chronic anticoagulation 08/22/2017   DVT (deep venous thrombosis) ([place]) 07/08/2015   Pulmonary embolism ([place]) 07/08/2015    Past Surgical History:  Procedure Laterality Date   biopsy Right 08/26/2019   temple biopsy - dr.dasher    EYE SURGERY Right    benign growth on right eye    KNEE SURGERY Right    MELANOMA EXCISION Bilateral    lower back     Prior to Admission medications   Medication Sig Start Date End Date Taking?  Authorizing Provider  amLODipine (NORVASC) 5 MG tablet Take 1 tablet (5 mg total) by mouth daily. 10/20/21   Johnson, Megan P, DO  cetirizine (ZYRTEC) 5 MG tablet Take 1 tablet (5 mg total) by mouth daily. 10/10/21   Johnson, Megan P, DO  Cholecalciferol (VITAMIN D3 PO) Take 1 tablet by mouth daily.    [provider]  Cyanocobalamin (VITAMIN B-12 ER PO) Take by mouth daily at 6 (six) AM.    [provider]  donepezil (ARICEPT) 5 MG tablet Take 1 tablet (5 mg total) by mouth at bedtime. 10/19/21   Johnson, Megan P, DO  ELDERBERRY PO Take 1 tablet by mouth daily.    [provider]  enalapril (VASOTEC) 20 MG tablet Take 1 tablet (20 mg total) by mouth 2 (two) times daily. 10/19/21   Johnson, Megan P, DO  ERIVEDGE 150 MG capsule Take 150 mg by mouth daily. 03/25/20   [provider]  fluticasone (FLONASE) 50 MCG/ACT nasal spray Place 2 sprays into both nostrils daily. 08/11/20   Eulogio Bear, NP  Grape Seed Extract 50 MG CAPS Take 1,300 mg by mouth daily.     [provider]  warfarin (COUMADIN) 5 MG tablet Take 1 tablet (5 mg total) by mouth one time only at 6 PM. 09/29/21   Cammie Sickle, MD    No Known Allergies  Family History  Problem Relation Age of Onset   Stroke  Mother    Cerebral aneurysm Father     Social History Social History   Tobacco Use   Smoking status: Never   Smokeless tobacco: Never  Vaping Use   Vaping Use: Never used  Substance Use Topics   Alcohol use: No   Drug use: No    Review of Systems Constitutional: Positive for fever to 102 Cardiovascular: Negative for chest pain. Respiratory: Negative for shortness of breath but positive for cough x2 days Gastrointestinal: Negative for abdominal pain.  Positive for nausea and diarrhea. Genitourinary: Increased urine frequency per wife Musculoskeletal: Negative for musculoskeletal complaint Neurological: Negative for headache All other ROS  negative  ____________________________________________   PHYSICAL EXAM:  VITAL SIGNS: ED Triage Vitals  Enc Vitals Group     BP 10/30/21 2117 (!) 141/77     Pulse Rate 10/30/21 2117 88     Resp 10/30/21 2117 20     Temp 10/30/21 2117 (!) 103 F (39.4 C)     Temp Source 10/30/21 2117 Oral     SpO2 10/30/21 2117 93 %     Weight 10/30/21 2118 170 lb (77.1 kg)     Height 10/30/21 2118 5\' 7"  (1.702 m)     Head Circumference --      Peak Flow --      Pain Score 10/30/21 2118 0     Pain Loc --      Pain Edu? --      Excl. in [place]? --     Constitutional: Awake alert, no acute distress.  Lying in bed calmly. Eyes: Normal exam ENT      Head: Normocephalic and atraumatic.      Mouth/Throat: Mucous membranes are moist. Cardiovascular: Normal rate, regular rhythm.  Respiratory: Normal respiratory effort without tachypnea nor retractions. Breath sounds are clear  Gastrointestinal: Soft and nontender. No distention.   Musculoskeletal: Nontender with normal range of motion in all extremities. Neurologic:  Normal speech and language. No gross focal neurologic deficits  Skin:  Skin is warm, dry and intact.  Psychiatric: Mood and affect are normal.  ____________________________________________    EKG  EKG viewed and interpreted by myself shows a sinus rhythm 87 bpm with a narrow QRS, left axis deviation, normal intervals, nonspecific ST changes.  Overall reassuring EKG.  ____________________________________________    RADIOLOGY  Chest x-ray is negative  ____________________________________________   INITIAL IMPRESSION / ASSESSMENT AND PLAN / ED COURSE  Pertinent labs & imaging results that were available during my care of the patient were reviewed by me and considered in my medical decision making (see chart for details).   Patient presents emergency department for 2 days of cough congestion now fever to 102 and confusion as well as generalized weakness and diarrhea.   Patient's labs have resulted showing an elevated troponin of 67, 73 on recheck.  Vital signs are reassuring 92 to 94% on room air, clear chest x-ray.  Patient's COVID test has resulted positive likely explaining the patient's fever confusion diarrhea and generalized weakness.  Given the patient's age weakness confusion COVID-positive status and elevated troponin we will admit to the hospital service for further work-up and treatment.  Wife agreeable to plan of care.  We will dose IV remdesivir.  Given no lung findings on chest x-ray we will hold off on Decadron at this time.  Gregg Thomas was evaluated in Emergency Department on 10/31/2021 for the symptoms described in the history of present illness. He was evaluated in the context of the  global COVID-19 pandemic, which necessitated consideration that the patient might be at risk for infection with the SARS-CoV-2 virus that causes COVID-19. Institutional protocols and algorithms that pertain to the evaluation of patients at risk for COVID-19 are in a state of rapid change based on information released by regulatory bodies including the CDC and federal and state organizations. These policies and algorithms were followed during the patient's care in the ED.  ____________________________________________   FINAL CLINICAL IMPRESSION(S) / ED DIAGNOSES  COVID-19 Weakness Altered mental status   Harvest Dark, MD 10/31/21 [phone]

## 2021-10-31 NOTE — H&P (Signed)
[facility] History and Physical  Gregg Thomas VHQ:[id number] DOB: 20-Jun-1931 DOA: 10/31/2021  Referring physician:  PCP: Valerie Roys, DO   Chief Complaint: SOB, diarrhea  HPI: Gregg Thomas is a 87 y.o. WM PMHx CKD, PE/DVT on Coumadin   presents to the emergency department for confusion fever and cough.  According to the wife 2 days ago patient began with a cough has had worsening generalized weakness to the point where he is having trouble ambulating and getting out of bed.  Has had diarrhea and last night developed confusion.  Patient was brought in last night to the emergency department for evaluation, prolonged waiting room time.  Wife states patient continues to be intermittently confused.  Patient is satting in the low to mid 90s on room air.  Denies any difficulty breathing.    Review of Systems:  Covid vaccination; vaccinated 3/3  Constitutional:  No weight loss, night sweats, positive fevers, negative chills, fatigue.  HEENT:  No headaches, Difficulty swallowing,Tooth/dental problems,Sore throat,  No sneezing, itching, ear ache, nasal congestion, post nasal drip,  Cardio-vascular:  No chest pain, Orthopnea, PND, swelling in lower extremities, anasarca, dizziness, palpitations  GI:  No heartburn, indigestion, abdominal pain, positive nausea, vomiting, diarrhea, change in bowel habits, negative loss of appetite  Resp:  positive shortness of breath with exertion or at rest.  Negative no excess mucus, no productive cough, No non-productive cough, No coughing up of blood.No change in color of mucus.No wheezing.No chest wall deformity  Skin:  no rash or lesions.  GU:  no dysuria, change in color of urine, no urgency or frequency. No flank pain.  Musculoskeletal:  No joint pain or swelling. No decreased range of motion. No back pain.  Psych:  positive change in mood or affect. No depression or anxiety. No memory loss.   Past Medical History:   Diagnosis Date   Chronic kidney disease    Clotting disorder ([place])    Heart murmur    Skin cancer    Past Surgical History:  Procedure Laterality Date   biopsy Right 08/26/2019   temple biopsy - dr.dasher    EYE SURGERY Right    benign growth on right eye    KNEE SURGERY Right    MELANOMA EXCISION Bilateral    lower back    Social History:  reports that he has never smoked. He has never used smokeless tobacco. He reports that he does not drink alcohol and does not use drugs.  No Known Allergies  Family History  Problem Relation Age of Onset   Stroke Mother    Cerebral aneurysm Father      Prior to Admission medications   Medication Sig Start Date End Date Taking? Authorizing Provider  amLODipine (NORVASC) 5 MG tablet Take 1 tablet (5 mg total) by mouth daily. 10/20/21   Johnson, Megan P, DO  cetirizine (ZYRTEC) 5 MG tablet Take 1 tablet (5 mg total) by mouth daily. 10/10/21   Johnson, Megan P, DO  Cholecalciferol (VITAMIN D3 PO) Take 1 tablet by mouth daily.    [provider]  Cyanocobalamin (VITAMIN B-12 ER PO) Take by mouth daily at 6 (six) AM.    [provider]  donepezil (ARICEPT) 5 MG tablet Take 1 tablet (5 mg total) by mouth at bedtime. 10/19/21   Johnson, Megan P, DO  ELDERBERRY PO Take 1 tablet by mouth daily.    [provider]  enalapril (VASOTEC) 20 MG tablet Take 1 tablet (20 mg total)  by mouth 2 (two) times daily. 10/19/21   Johnson, Megan P, DO  ERIVEDGE 150 MG capsule Take 150 mg by mouth daily. 03/25/20   [provider]  fluticasone (FLONASE) 50 MCG/ACT nasal spray Place 2 sprays into both nostrils daily. 08/11/20   Eulogio Bear, NP  Grape Seed Extract 50 MG CAPS Take 1,300 mg by mouth daily.     [provider]  warfarin (COUMADIN) 5 MG tablet Take 1 tablet (5 mg total) by mouth one time only at 6 PM. 09/29/21   Cammie Sickle, MD     Consultants:    Procedures/Significant Events:    I have  personally reviewed and interpreted all radiology studies and my findings are as above.   VENTILATOR SETTINGS: Room air SPO2 92%   Cultures 10/31 positive SARS coronavirus 10/31 negative influenza A/B  Antimicrobials: Anti-infectives (From admission, onward)    Start     Ordered Stop   11/01/21 1000  remdesivir 100 mg in sodium chloride 0.9 % 100 mL IVPB       See Hyperspace for full Linked Orders Report.   10/31/21 0900 11/05/21 0959   10/31/21 1000  remdesivir 200 mg in sodium chloride 0.9% 250 mL IVPB       See Hyperspace for full Linked Orders Report.   10/31/21 0900 10/31/21 1435         Devices    LINES / TUBES:      Continuous Infusions:  [START ON 11/01/2021] remdesivir 100 mg in NS 100 mL      Physical Exam: Vitals:   10/31/21 1100 10/31/21 1800 10/31/21 1900 10/31/21 1924  BP: 113/60 120/77 116/61 (!) 115/59  Pulse: 76 73 70 78  Resp: 17 16 (!) 23 14  Temp:      TempSrc:      SpO2: 94% 96% 94% 95%  Weight:      Height:        Wt Readings from Last 3 Encounters:  10/30/21 77.1 kg  10/19/21 77.1 kg  09/22/21 77.1 kg    General: No acute respiratory distress, positive dry cough Eyes: negative scleral hemorrhage,,positive RIGHT eye scleral edema negative anisocoria, negative icterus ENT: Negative Runny nose, negative gingival bleeding,positive hard of hearing Neck:  Negative scars, masses, torticollis, lymphadenopathy, JVD Lungs: decreased breath sounds bilaterally, positive wheezes or crackles Cardiovascular: Regular rate and rhythm without murmur gallop or rub normal S1 and S2 Abdomen: negative abdominal pain, nondistended, positive soft, bowel sounds, no rebound, no ascites, no appreciable mass Extremities: No significant cyanosis, clubbing, or edema bilateral lower extremities Skin: Negative rashes, lesions, ulcers Psychiatric:  Negative depression, negative anxiety, negative fatigue, negative mania  Central nervous system:  Cranial nerves  II through XII intact, tongue/uvula midline, all extremities muscle strength 5/5, sensation intact throughout,  negative dysarthria, negative expressive aphasia, negative receptive aphasia.        Labs on Admission:  Basic Metabolic Panel: Recent Labs  Lab 10/30/21 2127 10/31/21 1010  NA 137 136  K 4.3 4.1  CL 106 104  CO2 24 23  GLUCOSE 108* 111*  BUN 22 27*  CREATININE 1.93* 2.11*  CALCIUM 9.2 8.9  MG  --  1.7  PHOS  --  3.3   Liver Function Tests: Recent Labs  Lab 10/30/21 2127 10/31/21 1010  AST 34 30  ALT 25 25  ALKPHOS 97 74  BILITOT 1.2 1.2  PROT 7.6 6.7  ALBUMIN 3.9 3.5   No results for input(s): LIPASE, AMYLASE  in the last 168 hours. No results for input(s): AMMONIA in the last 168 hours. CBC: Recent Labs  Lab 10/30/21 2127 10/31/21 1010  WBC 9.2 12.9*  NEUTROABS  --  10.3*  HGB 15.3 15.0  HCT 43.4 42.3  MCV 99.1 97.9  PLT 154 154   Cardiac Enzymes: No results for input(s): CKTOTAL, CKMB, CKMBINDEX, TROPONINI in the last 168 hours.  BNP (last 3 results) No results for input(s): BNP in the last 8760 hours.  ProBNP (last 3 results) No results for input(s): PROBNP in the last 8760 hours.  CBG: No results for input(s): GLUCAP in the last 168 hours.  Radiological Exams on Admission: DG Chest 2 View  Result Date: 10/30/2021 CLINICAL DATA:  Fever. EXAM: CHEST - 2 VIEW COMPARISON:  Chest radiograph dated 09/07/2010. FINDINGS: No focal consolidation, pleural effusion, pneumothorax. The cardiac silhouette is within limits. Atherosclerotic calcification of the aorta. No acute osseous pathology. IMPRESSION: No active cardiopulmonary disease. Electronically Signed   By: Anner Crete M.D.   On: 10/30/2021 21:52    EKG: Independently reviewed.   Assessment/Plan Active Problems:   DVT (deep venous thrombosis) (HCC)   Pulmonary embolism (HCC)   Squamous cell carcinoma, face   Chronic deep vein thrombosis (DVT) of femoral vein of left lower extremity  (HCC)   Pneumonia due to COVID-19 virus   Gastroenteritis due to COVID-19 virus  COVID pneumonia/COVID gastroenteritis COVID-19 Labs  Recent Labs    10/31/21 1010  DDIMER 0.35  FERRITIN 120  LDH 137  CRP 5.0*    Lab Results  Component Value Date   SARSCOV2NAA POSITIVE (A) 10/30/2021  -Remdesivir x5 days per pharmacy protocol -Daily inflammatory markers - Solu-Medrol 80 mg daily - Vitamins per COVID protocol - Combivent QID  DVT/PE - After long discussion with pharmacy decided to continue with warfarin even though it interacts with remdesivir.  Decided safest anticoagulant - Monitor INR closely.  Lab Results  Component Value Date   INR 1.9 (H) 10/31/2021   INR 1.9 (H) 10/30/2021   INR 3.1 (H) 10/02/2021   INR 2.3 (H) 09/05/2021   INR 2.7 (H) 07/31/2021   Squamous cell carcinoma face - Stable   Body mass index is 26.63 kg/m.  Code Status: Full (DVT Prophylaxis: Coumadin for DVT/PE discussed with pharmacy and they will watch very closely given its interaction with remdesivir Family Communication: 11/1 wife at bedside for discussion of plan of care all questions answered Status is: Inpatient    Dispo: The patient is from: Home              Anticipated d/c is to: Home              Anticipated d/c date is: > 3 days              Patient currently is not medically stable to d/c.     Data Reviewed: Care during the described time interval was provided by me .  I have reviewed this patient's available data, including medical history, events of note, physical examination, and all test results as part of my evaluation.   The patient is critically ill with multiple organ systems failure and requires high complexity decision making for assessment and support, frequent evaluation and titration of therapies, application of advanced monitoring technologies and extensive interpretation of multiple databases. Critical Care Time devoted to patient care services described in  this note  Time spent: 70 minutes   Gregg Thomas, [facility]

## 2021-11-01 ENCOUNTER — Telehealth: Payer: Self-pay | Admitting: *Deleted

## 2021-11-01 DIAGNOSIS — J9601 Acute respiratory failure with hypoxia: Secondary | ICD-10-CM

## 2021-11-01 LAB — COMPREHENSIVE METABOLIC PANEL
ALT: 23 U/L (ref 0–44)
AST: 32 U/L (ref 15–41)
Albumin: 3.4 g/dL — ABNORMAL LOW (ref 3.5–5.0)
Alkaline Phosphatase: 78 U/L (ref 38–126)
Anion gap: 8 (ref 5–15)
BUN: 43 mg/dL — ABNORMAL HIGH (ref 8–23)
CO2: 21 mmol/L — ABNORMAL LOW (ref 22–32)
Calcium: 9.4 mg/dL (ref 8.9–10.3)
Chloride: 108 mmol/L (ref 98–111)
Creatinine, Ser: 2.12 mg/dL — ABNORMAL HIGH (ref 0.61–1.24)
GFR, Estimated: 29 mL/min — ABNORMAL LOW (ref >60)
Glucose, Bld: 165 mg/dL — ABNORMAL HIGH (ref 70–99)
Potassium: 4 mmol/L (ref 3.5–5.1)
Sodium: 137 mmol/L (ref 135–145)
Total Bilirubin: 0.7 mg/dL (ref 0.3–1.2)
Total Protein: 6.8 g/dL (ref 6.5–8.1)

## 2021-11-01 LAB — CBC WITH DIFFERENTIAL/PLATELET
Abs Immature Granulocytes: 0.12 10*3/uL — ABNORMAL HIGH (ref 0.00–0.07)
Basophils Absolute: 0 10*3/uL (ref 0.0–0.1)
Basophils Relative: 0 %
Eosinophils Absolute: 0 10*3/uL (ref 0.0–0.5)
Eosinophils Relative: 0 %
HCT: 44.8 % (ref 39.0–52.0)
Hemoglobin: 15.4 g/dL (ref 13.0–17.0)
Immature Granulocytes: 1 %
Lymphocytes Relative: 6 %
Lymphs Abs: 1 10*3/uL (ref 0.7–4.0)
MCH: 33 pg (ref 26.0–34.0)
MCHC: 34.4 g/dL (ref 30.0–36.0)
MCV: 96.1 fL (ref 80.0–100.0)
Monocytes Absolute: 0.5 10*3/uL (ref 0.1–1.0)
Monocytes Relative: 3 %
Neutro Abs: 14 10*3/uL — ABNORMAL HIGH (ref 1.7–7.7)
Neutrophils Relative %: 90 %
Platelets: 154 10*3/uL (ref 150–400)
RBC: 4.66 MIL/uL (ref 4.22–5.81)
RDW: 13.4 % (ref 11.5–15.5)
WBC: 15.7 10*3/uL — ABNORMAL HIGH (ref 4.0–10.5)
nRBC: 0 % (ref 0.0–0.2)

## 2021-11-01 LAB — FERRITIN: Ferritin: 141 ng/mL (ref 24–336)

## 2021-11-01 LAB — GLUCOSE, CAPILLARY: Glucose-Capillary: 169 mg/dL — ABNORMAL HIGH (ref 70–99)

## 2021-11-01 LAB — TROPONIN I (HIGH SENSITIVITY): Troponin I (High Sensitivity): 55 ng/L — ABNORMAL HIGH (ref <18)

## 2021-11-01 LAB — C-REACTIVE PROTEIN: CRP: 10 mg/dL — ABNORMAL HIGH (ref <1.0)

## 2021-11-01 LAB — PROTIME-INR
INR: 1.9 — ABNORMAL HIGH (ref 0.8–1.2)
Prothrombin Time: 22.1 seconds — ABNORMAL HIGH (ref 11.4–15.2)

## 2021-11-01 LAB — MAGNESIUM: Magnesium: 1.9 mg/dL (ref 1.7–2.4)

## 2021-11-01 LAB — BRAIN NATRIURETIC PEPTIDE: B Natriuretic Peptide: 98.3 pg/mL (ref 0.0–100.0)

## 2021-11-01 LAB — PHOSPHORUS: Phosphorus: 3.1 mg/dL (ref 2.5–4.6)

## 2021-11-01 LAB — D-DIMER, QUANTITATIVE: D-Dimer, Quant: 0.65 ug/mL-FEU — ABNORMAL HIGH (ref 0.00–0.50)

## 2021-11-01 MED ORDER — WARFARIN SODIUM 7.5 MG PO TABS
7.5000 mg | ORAL_TABLET | Freq: Once | ORAL | Status: AC
  Administered 2021-11-01: 7.5 mg via ORAL
  Filled 2021-11-01 (×2): qty 1

## 2021-11-01 NOTE — Consult Note (Addendum)
ANTICOAGULATION CONSULT NOTE - Initial Consult  Pharmacy Consult for Warfarin Indication: History of PE/DVT  No Known Allergies  Patient Measurements: Height: 5\' 7"  (170.2 cm) Weight: 77.1 kg (170 lb) IBW/kg (Calculated) : 66.1 Heparin Dosing Weight: 77.1 kg  Vital Signs: BP: 123/65 (11/02 0900) Pulse Rate: 67 (11/02 0900)  Labs: Recent Labs    10/30/21 2127 10/30/21 2128 10/31/21 0101 10/31/21 1010 10/31/21 1523 11/01/21 0722  HGB 15.3  --   --  15.0  --  15.4  HCT 43.4  --   --  42.3  --  44.8  PLT 154  --   --  154  --  154  LABPROT  --  21.5*  --  22.2*  --  22.1*  INR  --  1.9*  --  1.9*  --  1.9*  CREATININE 1.93*  --   --  2.11*  --  2.12*  TROPONINIHS 67*  --  73* 83* 89*  --      Estimated Creatinine Clearance: 22.5 mL/min (A) (by C-G formula based on SCr of 2.12 mg/dL (H)).   Medical History: Past Medical History:  Diagnosis Date   Chronic kidney disease    Clotting disorder ([place])    Heart murmur    Skin cancer     Medications:  AC: Warfarin 5 mg daily  Assessment: Pharmacy has been consulted to dose and monitor Warfarin in 87yo patient with history of PE/DVT(most recent in 11/2019) presenting to the ED with generalized weakness. Patient takes Warfarin 5 mg daily PTA.  DDI: remdesivir  Goal of Therapy:  INR 2-3 Monitor platelets by anticoagulation protocol: Yes   Plan:  --11/2: INR 1.9, subtherapeutic --Will repeat Warfarin 7.5 mg x 1 dose today --recheck INR with AM labs  [facility] A Clenton Esper 11/01/2021,10:58 AM

## 2021-11-01 NOTE — Progress Notes (Signed)
PROGRESS NOTE    [name]  DPO:[id number] DOB: 12-13-1931 DOA: 10/31/2021 PCP: Valerie Roys, DO    Brief Narrative:  Gregg Thomas is a 87 y.o. WM PMHx CKD, PE/DVT on Coumadin    presents to the emergency department for confusion fever and cough.  According to the wife 2 days ago patient began with a cough has had worsening generalized weakness to the point where he is having trouble ambulating and getting out of bed.  Has had diarrhea and last night developed confusion.  Patient was brought in last night to the emergency department for evaluation, prolonged waiting room time.  Wife states patient continues to be intermittently confused.  Oxygen saturation dropped down to 87%, was placed on 2 L oxygen.  Assessment & Plan:   Active Problems:   DVT (deep venous thrombosis) (HCC)   Pulmonary embolism (HCC)   Squamous cell carcinoma, face   Chronic deep vein thrombosis (DVT) of femoral vein of left lower extremity (HCC)   Pneumonia due to COVID-19 virus   Gastroenteritis due to COVID-19 virus  COVID-19 pneumonia. COVID gastroenteritis Acute hypoxemic respiratory failure secondary to COVID-pneumonia. Elevated troponin secondary to COVID-pneumonia. Patient admitted short of breath with exertion when he walked to the bathroom.  Currently still on 2 L oxygen. Will continue IV Solu-Medrol and remdesivir. No evidence of bacterial pneumonia, no evidence of congestive heart failure exacerbation.   Chronic left lower extremity DVT with history of PE. Continue anticoagulation with warfarin.  Chronic kidney disease stage IV. Reviewed previous labs, creatinine was 1.9-2.05 previously. Renal function still stable, will continue to follow.    DVT prophylaxis: Warfarin Code Status: full Family Communication: Wife at the bedside. Disposition Plan:    Status is: Inpatient  Remains inpatient appropriate because: Due to severity of disease and IV  treatment        No intake/output data recorded. Total I/O In: 100 [IV Piggyback:100] Out: -      Consultants:  None  Procedures: None  Antimicrobials: None  Subjective: Patient has some short of breath with exertion, he has significant cough, nonproductive. No fever or chills. No dysuria hematuria  Abdominal pain or nausea vomiting. No chest pain or palpitation.  Objective: Vitals:   11/01/21 1030 11/01/21 1100 11/01/21 1130 11/01/21 1200  BP: 133/66 (!) 120/51 (!) 128/92 (!) 124/55  Pulse: 77  77 77  Resp: 13 14  17   Temp:      TempSrc:      SpO2: 93%  94% 93%  Weight:      Height:        Intake/Output Summary (Last 24 hours) at 11/01/2021 1235 Last data filed at 11/01/2021 1103 Gross per 24 hour  Intake 100 ml  Output --  Net 100 ml   Filed Weights   10/30/21 2118  Weight: 77.1 kg    Examination:  General exam: Appears calm and comfortable  Respiratory system: Clear to auscultation. Respiratory effort normal. Cardiovascular system: S1 & S2 heard, RRR. No JVD, murmurs, rubs, gallops or clicks. No pedal edema. Gastrointestinal system: Abdomen is nondistended, soft and nontender. No organomegaly or masses felt. Normal bowel sounds heard. Central nervous system: Alert and oriented x3. No focal neurological deficits. Extremities: Symmetric 5 x 5 power. Skin: No rashes, lesions or ulcers Psychiatry: Judgement and insight appear normal. Mood & affect appropriate.     Data Reviewed: I have personally reviewed following labs and imaging studies  CBC: Recent Labs  Lab 10/30/21 2127 10/31/21 1010  11/01/21 0722  WBC 9.2 12.9* 15.7*  NEUTROABS  --  10.3* 14.0*  HGB 15.3 15.0 15.4  HCT 43.4 42.3 44.8  MCV 99.1 97.9 96.1  PLT 154 154 532   Basic Metabolic Panel: Recent Labs  Lab 10/30/21 2127 10/31/21 1010 11/01/21 0722  NA 137 136 137  K 4.3 4.1 4.0  CL 106 104 108  CO2 24 23 21*  GLUCOSE 108* 111* 165*  BUN 22 27* 43*  CREATININE 1.93*  2.11* 2.12*  CALCIUM 9.2 8.9 9.4  MG  --  1.7 1.9  PHOS  --  3.3 3.1   GFR: Estimated Creatinine Clearance: 22.5 mL/min (A) (by C-G formula based on SCr of 2.12 mg/dL (H)). Liver Function Tests: Recent Labs  Lab 10/30/21 2127 10/31/21 1010 11/01/21 0722  AST 34 30 32  ALT 25 25 23   ALKPHOS 97 74 78  BILITOT 1.2 1.2 0.7  PROT 7.6 6.7 6.8  ALBUMIN 3.9 3.5 3.4*   No results for input(s): LIPASE, AMYLASE in the last 168 hours. No results for input(s): AMMONIA in the last 168 hours. Coagulation Profile: Recent Labs  Lab 10/30/21 2128 10/31/21 1010 11/01/21 0722  INR 1.9* 1.9* 1.9*   Cardiac Enzymes: No results for input(s): CKTOTAL, CKMB, CKMBINDEX, TROPONINI in the last 168 hours. BNP (last 3 results) No results for input(s): PROBNP in the last 8760 hours. HbA1C: No results for input(s): HGBA1C in the last 72 hours. CBG: No results for input(s): GLUCAP in the last 168 hours. Lipid Profile: No results for input(s): CHOL, HDL, LDLCALC, TRIG, CHOLHDL, LDLDIRECT in the last 72 hours. Thyroid Function Tests: No results for input(s): TSH, T4TOTAL, FREET4, T3FREE, THYROIDAB in the last 72 hours. Anemia Panel: Recent Labs    10/31/21 1010 11/01/21 0722  FERRITIN 120 141   Sepsis Labs: Recent Labs  Lab 10/30/21 2127 10/31/21 0101 10/31/21 1010  PROCALCITON  --   --  0.40  LATICACIDVEN 1.2 1.1  --     Recent Results (from the past 240 hour(s))  Resp Panel by RT-PCR (Flu A&B, Covid) Nasopharyngeal Swab     Status: Abnormal   Collection Time: 10/30/21 10:49 PM   Specimen: Nasopharyngeal Swab; Nasopharyngeal(NP) swabs in vial transport medium  Result Value Ref Range Status   SARS Coronavirus 2 by RT PCR POSITIVE (A) NEGATIVE Final    Comment: RESULT CALLED TO, READ BACK BY AND VERIFIED WITH: ASHLEY ORSUTO @0012  ON 10/31/21 SK[facility] (NOTE) SARS-CoV-2 target nucleic acids are DETECTED.  The SARS-CoV-2 RNA is generally detectable in upper respiratory specimens during the  acute phase of infection. Positive results are indicative of the presence of the identified virus, but do not rule out bacterial infection or co-infection with other pathogens not detected by the test. Clinical correlation with patient history and other diagnostic information is necessary to determine patient infection status. The expected result is Negative.  Fact Sheet for Patients: EntrepreneurPulse.com.au  Fact Sheet for Healthcare Providers: IncredibleEmployment.be  This test is not yet approved or cleared by the [place] FDA and  has been authorized for detection and/or diagnosis of SARS-CoV-2 by FDA under an Emergency Use Authorization (EUA).  This EUA will remain in effect (meaning this test can b e used) for the duration of  the COVID-19 declaration under Section 564(b)(1) of the Act, 21 U.S.C. section 360bbb-3(b)(1), unless the authorization is terminated or revoked sooner.     Influenza A by PCR NEGATIVE NEGATIVE Final   Influenza B by PCR NEGATIVE NEGATIVE Final  Comment: (NOTE) The Xpert Xpress SARS-CoV-2/FLU/RSV plus assay is intended as an aid in the diagnosis of influenza from Nasopharyngeal swab specimens and should not be used as a sole basis for treatment. Nasal washings and aspirates are unacceptable for Xpert Xpress SARS-CoV-2/FLU/RSV testing.  Fact Sheet for Patients: EntrepreneurPulse.com.au  Fact Sheet for Healthcare Providers: IncredibleEmployment.be  This test is not yet approved or cleared by the [place] FDA and has been authorized for detection and/or diagnosis of SARS-CoV-2 by FDA under an Emergency Use Authorization (EUA). This EUA will remain in effect (meaning this test can be used) for the duration of the COVID-19 declaration under Section 564(b)(1) of the Act, 21 U.S.C. section 360bbb-3(b)(1), unless the authorization is terminated or revoked.  Performed at  [facility], [address] [place], [place] [address]   Culture, blood (Routine X 2) w Reflex to ID Panel     Status: None (Preliminary result)   Collection Time: 10/31/21 10:14 AM   Specimen: BLOOD  Result Value Ref Range Status   Specimen Description BLOOD RIGHT ANTECUBITAL  Final   Special Requests   Final    BOTTLES DRAWN AEROBIC AND ANAEROBIC Blood Culture adequate volume   Culture   Final    NO GROWTH < 24 HOURS Performed at [facility], [address] [place], [place] [address]    Report Status PENDING  Incomplete  Culture, blood (Routine X 2) w Reflex to ID Panel     Status: None (Preliminary result)   Collection Time: 10/31/21 10:35 AM   Specimen: BLOOD  Result Value Ref Range Status   Specimen Description BLOOD BLOOD RIGHT WRIST  Final   Special Requests   Final    BOTTLES DRAWN AEROBIC AND ANAEROBIC Blood Culture adequate volume   Culture   Final    NO GROWTH < 24 HOURS Performed at [facility], [address], [place], [place] [address]    Report Status PENDING  Incomplete         Radiology Studies: DG Chest 2 View  Result Date: 10/30/2021 CLINICAL DATA:  Fever. EXAM: CHEST - 2 VIEW COMPARISON:  Chest radiograph dated 09/07/2010. FINDINGS: No focal consolidation, pleural effusion, pneumothorax. The cardiac silhouette is within limits. Atherosclerotic calcification of the aorta. No acute osseous pathology. IMPRESSION: No active cardiopulmonary disease. Electronically Signed   By: Anner Crete M.D.   On: 10/30/2021 21:52        Scheduled Meds:  folic acid  1 mg Intravenous Daily   Ipratropium-Albuterol  1 puff Inhalation Q6H   methylPREDNISolone (SOLU-MEDROL) injection  80 mg Intravenous Daily   thiamine injection  100 mg Intravenous Daily   warfarin  7.5 mg Oral ONCE-1600   Warfarin - Pharmacist Dosing Inpatient   Does not apply q1600   Continuous Infusions:  remdesivir 100 mg in NS 100 mL Stopped (11/01/21 1103)      LOS: 1 day    Time spent: 32 minutes.  More than 50% time involved in direct patient care.    Sharen Hones, MD [facility]   To contact the attending provider between 7A-7P or the covering provider during after hours 7P-7A, please log into the web site [email or web address] and access using universal [place] password for that web site. If you do not have the password, please call the hospital operator.  11/01/2021, 12:35 PM

## 2021-11-01 NOTE — ED Notes (Signed)
Nate RN aware of assigned bed

## 2021-11-01 NOTE — ED Notes (Signed)
Pt assisted to [facility] and back into bed. Pt HR and O2 remained WNL during transfering and mibilization. Linens changed, gown changed, pants changed, pt reconnected to monitoring equipment. Wife at bedside, side table/callbell/belongings within reach. Bed in lowest position, side rails up.

## 2021-11-01 NOTE — Telephone Encounter (Signed)
Reviewed chart- patient currently in the hospital with covid pneumonia and alt mental status  [place]- pls cnl apts on Monday.

## 2021-11-02 LAB — CBC WITH DIFFERENTIAL/PLATELET
Abs Immature Granulocytes: 0.24 10*3/uL — ABNORMAL HIGH (ref 0.00–0.07)
Basophils Absolute: 0 10*3/uL (ref 0.0–0.1)
Basophils Relative: 0 %
Eosinophils Absolute: 0 10*3/uL (ref 0.0–0.5)
Eosinophils Relative: 0 %
HCT: 41.5 % (ref 39.0–52.0)
Hemoglobin: 14.3 g/dL (ref 13.0–17.0)
Immature Granulocytes: 1 %
Lymphocytes Relative: 5 %
Lymphs Abs: 0.9 10*3/uL (ref 0.7–4.0)
MCH: 33.2 pg (ref 26.0–34.0)
MCHC: 34.5 g/dL (ref 30.0–36.0)
MCV: 96.3 fL (ref 80.0–100.0)
Monocytes Absolute: 0.9 10*3/uL (ref 0.1–1.0)
Monocytes Relative: 5 %
Neutro Abs: 14.6 10*3/uL — ABNORMAL HIGH (ref 1.7–7.7)
Neutrophils Relative %: 89 %
Platelets: 152 10*3/uL (ref 150–400)
RBC: 4.31 MIL/uL (ref 4.22–5.81)
RDW: 13.7 % (ref 11.5–15.5)
WBC: 16.6 10*3/uL — ABNORMAL HIGH (ref 4.0–10.5)
nRBC: 0 % (ref 0.0–0.2)

## 2021-11-02 LAB — COMPREHENSIVE METABOLIC PANEL
ALT: 23 U/L (ref 0–44)
AST: 30 U/L (ref 15–41)
Albumin: 2.9 g/dL — ABNORMAL LOW (ref 3.5–5.0)
Alkaline Phosphatase: 77 U/L (ref 38–126)
Anion gap: 7 (ref 5–15)
BUN: 54 mg/dL — ABNORMAL HIGH (ref 8–23)
CO2: 23 mmol/L (ref 22–32)
Calcium: 9.3 mg/dL (ref 8.9–10.3)
Chloride: 110 mmol/L (ref 98–111)
Creatinine, Ser: 2.12 mg/dL — ABNORMAL HIGH (ref 0.61–1.24)
GFR, Estimated: 29 mL/min — ABNORMAL LOW (ref >60)
Glucose, Bld: 144 mg/dL — ABNORMAL HIGH (ref 70–99)
Potassium: 4.4 mmol/L (ref 3.5–5.1)
Sodium: 140 mmol/L (ref 135–145)
Total Bilirubin: 0.8 mg/dL (ref 0.3–1.2)
Total Protein: 6.4 g/dL — ABNORMAL LOW (ref 6.5–8.1)

## 2021-11-02 LAB — PHOSPHORUS: Phosphorus: 4.1 mg/dL (ref 2.5–4.6)

## 2021-11-02 LAB — PROTIME-INR
INR: 2.5 — ABNORMAL HIGH (ref 0.8–1.2)
Prothrombin Time: 27.2 seconds — ABNORMAL HIGH (ref 11.4–15.2)

## 2021-11-02 LAB — FERRITIN: Ferritin: 157 ng/mL (ref 24–336)

## 2021-11-02 LAB — MAGNESIUM: Magnesium: 2.1 mg/dL (ref 1.7–2.4)

## 2021-11-02 LAB — C-REACTIVE PROTEIN: CRP: 5 mg/dL — ABNORMAL HIGH (ref <1.0)

## 2021-11-02 LAB — D-DIMER, QUANTITATIVE: D-Dimer, Quant: 0.5 ug/mL-FEU (ref 0.00–0.50)

## 2021-11-02 MED ORDER — PREDNISONE 10 MG PO TABS: ORAL_TABLET | ORAL | 0 refills | Status: AC

## 2021-11-02 MED ORDER — WARFARIN SODIUM 2.5 MG PO TABS
2.5000 mg | ORAL_TABLET | Freq: Once | ORAL | Status: DC
  Filled 2021-11-02: qty 1

## 2021-11-02 NOTE — Plan of Care (Signed)
  Problem: Education: Goal: Knowledge of General Education information will improve Description: Including pain rating scale, medication(s)/side effects and non-pharmacologic comfort measures Outcome: Adequate for Discharge   Problem: Health Behavior/Discharge Planning: Goal: Ability to manage health-related needs will improve Outcome: Adequate for Discharge   Problem: Clinical Measurements: Goal: Ability to maintain clinical measurements within normal limits will improve Outcome: Adequate for Discharge Goal: Will remain free from infection Outcome: Adequate for Discharge Goal: Diagnostic test results will improve Outcome: Adequate for Discharge Goal: Respiratory complications will improve Outcome: Adequate for Discharge Goal: Cardiovascular complication will be avoided Outcome: Adequate for Discharge   Problem: Activity: Goal: Risk for activity intolerance will decrease Outcome: Adequate for Discharge   Problem: Nutrition: Goal: Adequate nutrition will be maintained Outcome: Adequate for Discharge   Problem: Elimination: Goal: Will not experience complications related to bowel motility Outcome: Adequate for Discharge Goal: Will not experience complications related to urinary retention Outcome: Adequate for Discharge   Problem: Coping: Goal: Level of anxiety will decrease Outcome: Adequate for Discharge   Problem: Pain Managment: Goal: General experience of comfort will improve Outcome: Adequate for Discharge   Problem: Safety: Goal: Ability to remain free from injury will improve Outcome: Adequate for Discharge   Problem: Skin Integrity: Goal: Risk for impaired skin integrity will decrease Outcome: Adequate for Discharge   Problem: Education: Goal: Knowledge of risk factors and measures for prevention of condition will improve Outcome: Adequate for Discharge   Problem: Coping: Goal: Psychosocial and spiritual needs will be supported Outcome: Adequate for  Discharge   Problem: Respiratory: Goal: Will maintain a patent airway Outcome: Adequate for Discharge Goal: Complications related to the disease process, condition or treatment will be avoided or minimized Outcome: Adequate for Discharge

## 2021-11-02 NOTE — Consult Note (Signed)
[place] for Warfarin Indication: History of PE/DVT  No Known Allergies  Patient Measurements: Height: 5\' 7"  (170.2 cm) Weight: 77.1 kg (170 lb) IBW/kg (Calculated) : 66.1 Heparin Dosing Weight: 77.1 kg  Vital Signs: Temp: 97.9 F (36.6 C) (11/03 0826) Temp Source: Oral (11/03 0826) BP: 126/62 (11/03 0826) Pulse Rate: 64 (11/03 0826)  Labs: Recent Labs    10/31/21 1010 10/31/21 1523 11/01/21 0722 11/02/21 0637  HGB 15.0  --  15.4 14.3  HCT 42.3  --  44.8 41.5  PLT 154  --  154 152  LABPROT 22.2*  --  22.1* 27.2*  INR 1.9*  --  1.9* 2.5*  CREATININE 2.11*  --  2.12* 2.12*  TROPONINIHS 83* 89* 55*  --      Estimated Creatinine Clearance: 22.5 mL/min (A) (by C-G formula based on SCr of 2.12 mg/dL (H)).   Medical History: Past Medical History:  Diagnosis Date   Chronic kidney disease    Clotting disorder ([place])    Heart murmur    Skin cancer     Medications:  AC: Warfarin 5 mg daily  Assessment: Pharmacy has been consulted to dose and monitor Warfarin in 87yo patient with history of PE/DVT(most recent in 11/2019) presenting to the ED with generalized weakness. Patient takes Warfarin 5 mg daily PTA.  Date INR Warfarin Dose  11/1 1.9 7.5 mg  11/2 1.9 7.5 mg  11/3 2.5 2.5 mg     Goal of Therapy:  INR 2-3 Monitor platelets by anticoagulation protocol: Yes   Plan:  INR is therapeutic. Pt received two high dose warfarin in the last two days. Will give warfarin 2.5 mg today as I predict the INR to continue to trend up tomorrow. Daily INR and CBC at least every 3 days.   Oswald Hillock, PharmD, BCPS 11/02/2021,9:10 AM

## 2021-11-02 NOTE — Discharge Summary (Signed)
Physician Discharge Summary  Patient ID: Jessi Jessop MRN: [id number] DOB/AGE: 03-Mar-1931 87 y.o.  Admit date: 10/31/2021 Discharge date: 11/02/2021  Admission Diagnoses:  Discharge Diagnoses:  Active Problems:   DVT (deep venous thrombosis) (HCC)   Pulmonary embolism (HCC)   Squamous cell carcinoma, face   CKD (chronic kidney disease) stage 4, GFR 15-29 ml/min (HCC)   Chronic deep vein thrombosis (DVT) of femoral vein of left lower extremity ([place])   Pneumonia due to COVID-19 virus   Gastroenteritis due to COVID-19 virus   Acute hypoxemic respiratory failure [facility])   Discharged Condition: good  Hospital Course:  Kamaron Deskins is a 87 y.o. WM PMHx CKD, PE/DVT on Coumadin  presents to the emergency department for confusion fever and cough.  According to the wife 2 days ago patient began with a cough has had worsening generalized weakness to the point where he is having trouble ambulating and getting out of bed.  Has had diarrhea and last night developed confusion.  Patient was brought in last night to the emergency department for evaluation, prolonged waiting room time.  Wife states patient continues to be intermittently confused.  Oxygen saturation dropped down to 87%, was placed on 2 L oxygen.    COVID-19 pneumonia. COVID gastroenteritis Acute hypoxemic respiratory failure secondary to COVID-pneumonia. Elevated troponin secondary to COVID-pneumonia. Patient condition has improved after giving IV Solu-Medrol and remdesivir.  He is currently off oxygen. No evidence of bacterial pneumonia, no evidence of congestive heart failure exacerbation. At this point, he is medically stable to be discharged.  He will be followed by PCP within 1 week.     Chronic left lower extremity DVT with history of PE. Continue anticoagulation with warfarin.  Chronic kidney disease stage IV. Reviewed previous labs, creatinine was 1.9-2.05 previously. Renal function still stable.     Consults: None  Significant Diagnostic Studies:   Treatments: Solu-Medrol, remdesivir.  Discharge Exam: Blood pressure 119/66, pulse 63, temperature 98.2 F (36.8 C), resp. rate 18, height 5\' 7"  (1.702 m), weight 77.1 kg, SpO2 95 %. General appearance: alert and cooperative Resp: clear to auscultation bilaterally Cardio: regular rate and rhythm, S1, S2 normal, no murmur, click, rub or gallop GI: soft, non-tender; bowel sounds normal; no masses,  no organomegaly Extremities: extremities normal, atraumatic, no cyanosis or edema  Disposition: Discharge disposition: 01-Home or Self Care       Discharge Instructions     Diet - low sodium heart healthy   Complete by: As directed    Increase activity slowly   Complete by: As directed       Allergies as of 11/02/2021   No Known Allergies      Medication List     STOP taking these medications    enalapril 20 MG tablet Commonly known as: VASOTEC       TAKE these medications    amLODipine 10 MG tablet Commonly known as: NORVASC Take 10 mg by mouth daily. What changed: Another medication with the same name was removed. Continue taking this medication, and follow the directions you see here.   cetirizine 5 MG tablet Commonly known as: ZYRTEC Take 1 tablet (5 mg total) by mouth daily.   donepezil 5 MG tablet Commonly known as: ARICEPT Take 1 tablet (5 mg total) by mouth at bedtime.   ELDERBERRY PO Take 1 tablet by mouth daily.   Erivedge 150 MG capsule Generic drug: vismodegib Take 150 mg by mouth daily.   fluticasone 50 MCG/ACT nasal spray Commonly  known as: FLONASE Place 2 sprays into both nostrils daily.   Grape Seed Extract 50 MG Caps Take 1,300 mg by mouth daily.   predniSONE 10 MG tablet Commonly known as: DELTASONE Take 4 tablets (40 mg total) by mouth daily for 4 days, THEN 2 tablets (20 mg total) daily for 2 days, THEN 1 tablet (10 mg total) daily for 2 days. Start taking on: November 02, 2021   VITAMIN B-12 ER PO Take by mouth daily at 6 (six) AM.   VITAMIN D3 PO Take 1 tablet by mouth daily.   warfarin 5 MG tablet Commonly known as: COUMADIN Take 1 tablet (5 mg total) by mouth one time only at 6 PM.        Follow-up Information     Johnson, Megan P, DO Follow up in 1 week(s).   Specialty: Family Medicine Contact information: [place] [address] [phone]                 Signed: Sharen Hones 11/02/2021, 1:23 PM

## 2021-11-03 ENCOUNTER — Telehealth: Payer: Self-pay

## 2021-11-03 NOTE — Telephone Encounter (Signed)
Per wife  Transition Care Management Follow-up Telephone Call Date of discharge and from where: 11/02/2021 [facility] How have you been since you were released from the hospital? Pretty good, still resting Any questions or concerns? No  Items Reviewed: Did the pt receive and understand the discharge instructions provided? Yes  Medications obtained and verified? Yes  Other? No  Any new allergies since your discharge? No  Dietary orders reviewed? Yes Do you have support at home? Yes   Home Care and Equipment/Supplies: Were home health services ordered? not applicable If so, what is the name of the agency? N/a  Has the agency set up a time to come to the patient's home? not applicable Were any new equipment or medical supplies ordered?  No What is the name of the medical supply agency? N/a Were you able to get the supplies/equipment? not applicable Do you have any questions related to the use of the equipment or supplies? No  Functional Questionnaire: (I = Independent and D = Dependent) ADLs: I  Bathing/Dressing- I  Meal Prep- I  Eating- I  Maintaining continence- I  Transferring/Ambulation- I  Managing Meds- D  Follow up appointments reviewed:  PCP Hospital f/u appt confirmed? Yes  Scheduled to see Dr. Wynetta Emery on 11/10/2021 @ 9:40. Are transportation arrangements needed? No  If their condition worsens, is the pt aware to call PCP or go to the Emergency Dept.? Yes Was the patient provided with contact information for the PCP's office or ED? Yes Was to pt encouraged to call back with questions or concerns? Yes

## 2021-11-05 LAB — CULTURE, BLOOD (ROUTINE X 2)
Culture: NO GROWTH
Culture: NO GROWTH
Special Requests: ADEQUATE
Special Requests: ADEQUATE

## 2021-11-06 ENCOUNTER — Inpatient Hospital Stay: Payer: Medicare Other

## 2021-11-06 ENCOUNTER — Inpatient Hospital Stay: Payer: Medicare Other | Admitting: Internal Medicine

## 2021-11-10 ENCOUNTER — Inpatient Hospital Stay: Payer: Medicare Other | Admitting: Family Medicine

## 2021-11-16 ENCOUNTER — Other Ambulatory Visit: Payer: Self-pay

## 2021-11-16 ENCOUNTER — Encounter: Payer: Self-pay | Admitting: Family Medicine

## 2021-11-16 ENCOUNTER — Ambulatory Visit: Payer: Medicare Other | Admitting: Family Medicine

## 2021-11-16 VITALS — BP 116/71 | HR 66 | Temp 97.4°F | Wt 158.8 lb

## 2021-11-16 DIAGNOSIS — N184 Chronic kidney disease, stage 4 (severe): Secondary | ICD-10-CM | POA: Diagnosis not present

## 2021-11-16 DIAGNOSIS — J9601 Acute respiratory failure with hypoxia: Secondary | ICD-10-CM | POA: Diagnosis not present

## 2021-11-16 DIAGNOSIS — U071 COVID-19: Secondary | ICD-10-CM

## 2021-11-16 DIAGNOSIS — I82512 Chronic embolism and thrombosis of left femoral vein: Secondary | ICD-10-CM | POA: Diagnosis not present

## 2021-11-16 DIAGNOSIS — U099 Post covid-19 condition, unspecified: Secondary | ICD-10-CM

## 2021-11-16 DIAGNOSIS — G9332 Myalgic encephalomyelitis/chronic fatigue syndrome: Secondary | ICD-10-CM | POA: Diagnosis not present

## 2021-11-16 DIAGNOSIS — A0839 Other viral enteritis: Secondary | ICD-10-CM

## 2021-11-16 DIAGNOSIS — J1282 Pneumonia due to coronavirus disease 2019: Secondary | ICD-10-CM

## 2021-11-16 NOTE — Progress Notes (Signed)
BP 116/71   Pulse 66   Temp (!) 97.4 F (36.3 C)   Wt 158 lb 12.8 oz (72 kg)   SpO2 96%   BMI 24.87 kg/m    Subjective:    Patient ID: Gregg Thomas, male    DOB: 05/11/1931, 87 y.o.   MRN: [id number]  HPI: Gregg Thomas is a 87 y.o. male  Chief Complaint  Patient presents with   Hospitalization Follow-up    Patient states he was in the hospital due to pneumonia. Patient wife says he is very tired, and has congestion    Transition of Care Hospital Follow up.   Hospital/Facility: [facility] D/C Physician: Dr. Roosevelt Locks D/C Date: 11/02/21  Records Requested: 11/16/21 Records Received: 11/16/21 Records Reviewed: 11/16/21  Diagnoses on Discharge:   DVT (deep venous thrombosis) ([place])   Pulmonary embolism (HCC)   Squamous cell carcinoma, face   CKD (chronic kidney disease) stage 4, GFR 15-29 ml/min (HCC)   Chronic deep vein thrombosis (DVT) of femoral vein of left lower extremity ([place])   Pneumonia due to COVID-19 virus   Gastroenteritis due to COVID-19 virus   Acute hypoxemic respiratory failure ([place])  Date of interactive Contact within 48 hours of discharge: 11/03/21 Contact was through: phone  Date of 7 day or 14 day face-to-face visit: 11/16/21   within 14 days  Outpatient Encounter Medications as of 11/16/2021  Medication Sig Note   amLODipine (NORVASC) 5 MG tablet Take 5 mg by mouth daily.    cetirizine (ZYRTEC) 5 MG tablet Take 1 tablet (5 mg total) by mouth daily.    Cholecalciferol (VITAMIN D3 PO) Take 1 tablet by mouth daily.    Cyanocobalamin (VITAMIN B-12 ER PO) Take by mouth daily at 6 (six) AM.    donepezil (ARICEPT) 5 MG tablet Take 1 tablet (5 mg total) by mouth at bedtime.    ELDERBERRY PO Take 1 tablet by mouth daily.    fluticasone (FLONASE) 50 MCG/ACT nasal spray Place 2 sprays into both nostrils daily. 09/20/2020: As needed   Grape Seed Extract 50 MG CAPS Take 1,300 mg by mouth daily.     Ipratropium-Albuterol (COMBIVENT) 20-100 MCG/ACT  AERS respimat Inhale 1 puff into the lungs every 6 (six) hours.    warfarin (COUMADIN) 5 MG tablet Take 1 tablet (5 mg total) by mouth one time only at 6 PM.    ERIVEDGE 150 MG capsule Take 150 mg by mouth daily. (Patient not taking: Reported on 11/16/2021) 10/31/2021: Patient takes 2 months on and 2 months off (Off period now)   [DISCONTINUED] amLODipine (NORVASC) 10 MG tablet Take 10 mg by mouth daily. (Patient not taking: Reported on 11/16/2021)    No facility-administered encounter medications on file as of 11/16/2021.  Per Hospitalist: "Hospital Course:  Gregg Thomas is a 87 y.o. WM PMHx CKD, PE/DVT on Coumadin  presents to the emergency department for confusion fever and cough.  According to the wife 2 days ago patient began with a cough has had worsening generalized weakness to the point where he is having trouble ambulating and getting out of bed.  Has had diarrhea and last night developed confusion.  Patient was brought in last night to the emergency department for evaluation, prolonged waiting room time.  Wife states patient continues to be intermittently confused.  Oxygen saturation dropped down to 87%, was placed on 2 L oxygen.     COVID-19 pneumonia. COVID gastroenteritis Acute hypoxemic respiratory failure secondary to COVID-pneumonia. Elevated troponin secondary to  COVID-pneumonia. Patient condition has improved after giving IV Solu-Medrol and remdesivir.  He is currently off oxygen. No evidence of bacterial pneumonia, no evidence of congestive heart failure exacerbation. At this point, he is medically stable to be discharged.  He will be followed by PCP within 1 week.   Chronic left lower extremity DVT with history of PE. Continue anticoagulation with warfarin.  Chronic kidney disease stage IV. Reviewed previous labs, creatinine was 1.9-2.05 previously. Renal function still stable."  Diagnostic Tests Reviewed: CLINICAL DATA:  Fever.   EXAM: CHEST - 2 VIEW    COMPARISON:  Chest radiograph dated 09/07/2010.   FINDINGS: No focal consolidation, pleural effusion, pneumothorax. The cardiac silhouette is within limits. Atherosclerotic calcification of the aorta. No acute osseous pathology.   IMPRESSION: No active cardiopulmonary disease.  Disposition: Home  Consults: None  Discharge Instructions: Follow up here in 1-2 weeks   Disease/illness Education: Discussed today  Home Health/Community Services Discussions/Referrals: N/A  Establishment or re-establishment of referral orders for community resources: N/A  Discussion with other health care providers: N/A  Assessment and Support of treatment regimen adherence: Good  Appointments Coordinated with: Patient and wife  Education for self-management, independent living, and ADLs: Discussed today  No ocughing or wheezing. No fevers. NO SOB.  Relevant past medical, surgical, family and social history reviewed and updated as indicated. Interim medical history since our last visit reviewed. Allergies and medications reviewed and updated.  Review of Systems  Constitutional:  Positive for fatigue. Negative for activity change, appetite change, chills, diaphoresis, fever and unexpected weight change.  Respiratory: Negative.    Cardiovascular: Negative.   Gastrointestinal: Negative.   Musculoskeletal: Negative.   Hematological: Negative.   Psychiatric/Behavioral: Negative.     Per HPI unless specifically indicated above     Objective:    BP 116/71   Pulse 66   Temp (!) 97.4 F (36.3 C)   Wt 158 lb 12.8 oz (72 kg)   SpO2 96%   BMI 24.87 kg/m   Wt Readings from Last 3 Encounters:  11/16/21 158 lb 12.8 oz (72 kg)  10/30/21 170 lb (77.1 kg)  10/19/21 170 lb (77.1 kg)    Physical Exam Vitals and nursing note reviewed.  Constitutional:      General: He is not in acute distress.    Appearance: Normal appearance. He is normal weight. He is not ill-appearing, toxic-appearing or  diaphoretic.  HENT:     Head: Normocephalic and atraumatic.     Right Ear: External ear normal.     Left Ear: External ear normal.     Nose: Nose normal.     Mouth/Throat:     Mouth: Mucous membranes are moist.     Pharynx: Oropharynx is clear.  Eyes:     General: No scleral icterus.       Right eye: No discharge.        Left eye: No discharge.     Extraocular Movements: Extraocular movements intact.     Conjunctiva/sclera: Conjunctivae normal.     Pupils: Pupils are equal, round, and reactive to light.  Cardiovascular:     Rate and Rhythm: Normal rate and regular rhythm.     Pulses: Normal pulses.     Heart sounds: Normal heart sounds. No murmur heard.   No friction rub. No gallop.  Pulmonary:     Effort: Pulmonary effort is normal. No respiratory distress.     Breath sounds: Normal breath sounds. No stridor. No wheezing, rhonchi or rales.  Chest:     Chest wall: No tenderness.  Musculoskeletal:        General: Normal range of motion.     Cervical back: Normal range of motion and neck supple.  Skin:    General: Skin is warm and dry.     Capillary Refill: Capillary refill takes less than 2 seconds.     Coloration: Skin is not jaundiced or pale.     Findings: No bruising, erythema, lesion or rash.  Neurological:     General: No focal deficit present.     Mental Status: He is alert and oriented to person, place, and time. Mental status is at baseline.  Psychiatric:        Mood and Affect: Mood normal.        Behavior: Behavior normal.        Thought Content: Thought content normal.        Judgment: Judgment normal.    Results for orders placed or performed during the hospital encounter of 10/31/21  Resp Panel by RT-PCR (Flu A&B, Covid) Nasopharyngeal Swab   Specimen: Nasopharyngeal Swab; Nasopharyngeal(NP) swabs in vial transport medium  Result Value Ref Range   SARS Coronavirus 2 by RT PCR POSITIVE (A) NEGATIVE   Influenza A by PCR NEGATIVE NEGATIVE   Influenza B by  PCR NEGATIVE NEGATIVE  Culture, blood (Routine X 2) w Reflex to ID Panel   Specimen: BLOOD  Result Value Ref Range   Specimen Description BLOOD RIGHT ANTECUBITAL    Special Requests      BOTTLES DRAWN AEROBIC AND ANAEROBIC Blood Culture adequate volume   Culture      NO GROWTH 5 DAYS Performed at [facility], [address] [place], [place] [address]    Report Status 11/05/2021 FINAL   Culture, blood (Routine X 2) w Reflex to ID Panel   Specimen: BLOOD  Result Value Ref Range   Specimen Description BLOOD BLOOD RIGHT WRIST    Special Requests      BOTTLES DRAWN AEROBIC AND ANAEROBIC Blood Culture adequate volume   Culture      NO GROWTH 5 DAYS Performed at [facility], [address] [place], [place] [address]    Report Status 11/05/2021 FINAL   CBC  Result Value Ref Range   WBC 9.2 4.0 - 10.5 K/uL   RBC 4.38 4.22 - 5.81 MIL/uL   Hemoglobin 15.3 13.0 - 17.0 g/dL   HCT 43.4 39.0 - 52.0 %   MCV 99.1 80.0 - 100.0 fL   MCH 34.9 (H) 26.0 - 34.0 pg   MCHC 35.3 30.0 - 36.0 g/dL   RDW 13.4 11.5 - 15.5 %   Platelets 154 150 - 400 K/uL   nRBC 0.0 0.0 - 0.2 %  Comprehensive metabolic panel  Result Value Ref Range   Sodium 137 135 - 145 mmol/L   Potassium 4.3 3.5 - 5.1 mmol/L   Chloride 106 98 - 111 mmol/L   CO2 24 22 - 32 mmol/L   Glucose, Bld 108 (H) 70 - 99 mg/dL   BUN 22 8 - 23 mg/dL   Creatinine, Ser 1.93 (H) 0.61 - 1.24 mg/dL   Calcium 9.2 8.9 - 10.3 mg/dL   Total Protein 7.6 6.5 - 8.1 g/dL   Albumin 3.9 3.5 - 5.0 g/dL   AST 34 15 - 41 U/L   ALT 25 0 - 44 U/L   Alkaline Phosphatase 97 38 - 126 U/L   Total Bilirubin 1.2 0.3 -  1.2 mg/dL   GFR, Estimated 33 (L) >60 mL/min   Anion gap 7 5 - 15  Lactic acid, plasma  Result Value Ref Range   Lactic Acid, Venous 1.2 0.5 - 1.9 mmol/L  Lactic acid, plasma  Result Value Ref Range   Lactic Acid, Venous 1.1 0.5 - 1.9 mmol/L  Urinalysis, Complete w Microscopic  Result Value Ref Range   Color, Urine  YELLOW (A) YELLOW   APPearance CLEAR (A) CLEAR   Specific Gravity, Urine 1.019 1.005 - 1.030   pH 5.0 5.0 - 8.0   Glucose, UA 50 (A) NEGATIVE mg/dL   Hgb urine dipstick MODERATE (A) NEGATIVE   Bilirubin Urine NEGATIVE NEGATIVE   Ketones, ur NEGATIVE NEGATIVE mg/dL   Protein, ur 100 (A) NEGATIVE mg/dL   Nitrite NEGATIVE NEGATIVE   Leukocytes,Ua NEGATIVE NEGATIVE   RBC / HPF 0-5 0 - 5 RBC/hpf   WBC, UA 0-5 0 - 5 WBC/hpf   Bacteria, UA NONE SEEN NONE SEEN   Squamous Epithelial / LPF 0-5 0 - 5   Mucus PRESENT   Protime-INR  Result Value Ref Range   Prothrombin Time 21.5 (H) 11.4 - 15.2 seconds   INR 1.9 (H) 0.8 - 1.2  C-reactive protein  Result Value Ref Range   CRP 5.0 (H) <1.0 mg/dL  Comprehensive metabolic panel  Result Value Ref Range   Sodium 136 135 - 145 mmol/L   Potassium 4.1 3.5 - 5.1 mmol/L   Chloride 104 98 - 111 mmol/L   CO2 23 22 - 32 mmol/L   Glucose, Bld 111 (H) 70 - 99 mg/dL   BUN 27 (H) 8 - 23 mg/dL   Creatinine, Ser 2.11 (H) 0.61 - 1.24 mg/dL   Calcium 8.9 8.9 - 10.3 mg/dL   Total Protein 6.7 6.5 - 8.1 g/dL   Albumin 3.5 3.5 - 5.0 g/dL   AST 30 15 - 41 U/L   ALT 25 0 - 44 U/L   Alkaline Phosphatase 74 38 - 126 U/L   Total Bilirubin 1.2 0.3 - 1.2 mg/dL   GFR, Estimated 30 (L) >60 mL/min   Anion gap 9 5 - 15  CBC with Differential/Platelet  Result Value Ref Range   WBC 12.9 (H) 4.0 - 10.5 K/uL   RBC 4.32 4.22 - 5.81 MIL/uL   Hemoglobin 15.0 13.0 - 17.0 g/dL   HCT 42.3 39.0 - 52.0 %   MCV 97.9 80.0 - 100.0 fL   MCH 34.7 (H) 26.0 - 34.0 pg   MCHC 35.5 30.0 - 36.0 g/dL   RDW 13.7 11.5 - 15.5 %   Platelets 154 150 - 400 K/uL   nRBC 0.0 0.0 - 0.2 %   Neutrophils Relative % 80 %   Neutro Abs 10.3 (H) 1.7 - 7.7 K/uL   Lymphocytes Relative 10 %   Lymphs Abs 1.2 0.7 - 4.0 K/uL   Monocytes Relative 10 %   Monocytes Absolute 1.3 (H) 0.1 - 1.0 K/uL   Eosinophils Relative 0 %   Eosinophils Absolute 0.0 0.0 - 0.5 K/uL   Basophils Relative 0 %   Basophils  Absolute 0.0 0.0 - 0.1 K/uL   Immature Granulocytes 0 %   Abs Immature Granulocytes 0.05 0.00 - 0.07 K/uL  D-dimer, quantitative  Result Value Ref Range   D-Dimer, Quant 0.35 0.00 - 0.50 ug/mL-FEU  Ferritin  Result Value Ref Range   Ferritin 120 24 - 336 ng/mL  Hepatitis B surface antigen  Result Value Ref Range  Hepatitis B Surface Ag NON REACTIVE NON REACTIVE  Lactate dehydrogenase  Result Value Ref Range   LDH 137 98 - 192 U/L  Procalcitonin  Result Value Ref Range   Procalcitonin 0.40 ng/mL  Phosphorus  Result Value Ref Range   Phosphorus 3.3 2.5 - 4.6 mg/dL  Magnesium  Result Value Ref Range   Magnesium 1.7 1.7 - 2.4 mg/dL  Protime-INR  Result Value Ref Range   Prothrombin Time 22.2 (H) 11.4 - 15.2 seconds   INR 1.9 (H) 0.8 - 1.2  CBC with Differential/Platelet  Result Value Ref Range   WBC 15.7 (H) 4.0 - 10.5 K/uL   RBC 4.66 4.22 - 5.81 MIL/uL   Hemoglobin 15.4 13.0 - 17.0 g/dL   HCT 44.8 39.0 - 52.0 %   MCV 96.1 80.0 - 100.0 fL   MCH 33.0 26.0 - 34.0 pg   MCHC 34.4 30.0 - 36.0 g/dL   RDW 13.4 11.5 - 15.5 %   Platelets 154 150 - 400 K/uL   nRBC 0.0 0.0 - 0.2 %   Neutrophils Relative % 90 %   Neutro Abs 14.0 (H) 1.7 - 7.7 K/uL   Lymphocytes Relative 6 %   Lymphs Abs 1.0 0.7 - 4.0 K/uL   Monocytes Relative 3 %   Monocytes Absolute 0.5 0.1 - 1.0 K/uL   Eosinophils Relative 0 %   Eosinophils Absolute 0.0 0.0 - 0.5 K/uL   Basophils Relative 0 %   Basophils Absolute 0.0 0.0 - 0.1 K/uL   Immature Granulocytes 1 %   Abs Immature Granulocytes 0.12 (H) 0.00 - 0.07 K/uL  Comprehensive metabolic panel  Result Value Ref Range   Sodium 137 135 - 145 mmol/L   Potassium 4.0 3.5 - 5.1 mmol/L   Chloride 108 98 - 111 mmol/L   CO2 21 (L) 22 - 32 mmol/L   Glucose, Bld 165 (H) 70 - 99 mg/dL   BUN 43 (H) 8 - 23 mg/dL   Creatinine, Ser 2.12 (H) 0.61 - 1.24 mg/dL   Calcium 9.4 8.9 - 10.3 mg/dL   Total Protein 6.8 6.5 - 8.1 g/dL   Albumin 3.4 (L) 3.5 - 5.0 g/dL   AST 32  15 - 41 U/L   ALT 23 0 - 44 U/L   Alkaline Phosphatase 78 38 - 126 U/L   Total Bilirubin 0.7 0.3 - 1.2 mg/dL   GFR, Estimated 29 (L) >60 mL/min   Anion gap 8 5 - 15  C-reactive protein  Result Value Ref Range   CRP 10.0 (H) <1.0 mg/dL  D-dimer, quantitative  Result Value Ref Range   D-Dimer, Quant 0.65 (H) 0.00 - 0.50 ug/mL-FEU  Ferritin  Result Value Ref Range   Ferritin 141 24 - 336 ng/mL  Magnesium  Result Value Ref Range   Magnesium 1.9 1.7 - 2.4 mg/dL  Phosphorus  Result Value Ref Range   Phosphorus 3.1 2.5 - 4.6 mg/dL  Protime-INR  Result Value Ref Range   Prothrombin Time 22.1 (H) 11.4 - 15.2 seconds   INR 1.9 (H) 0.8 - 1.2  Brain natriuretic peptide  Result Value Ref Range   B Natriuretic Peptide 98.3 0.0 - 100.0 pg/mL  CBC with Differential/Platelet  Result Value Ref Range   WBC 16.6 (H) 4.0 - 10.5 K/uL   RBC 4.31 4.22 - 5.81 MIL/uL   Hemoglobin 14.3 13.0 - 17.0 g/dL   HCT 41.5 39.0 - 52.0 %   MCV 96.3 80.0 - 100.0 fL   MCH 33.2 26.0 -  34.0 pg   MCHC 34.5 30.0 - 36.0 g/dL   RDW 13.7 11.5 - 15.5 %   Platelets 152 150 - 400 K/uL   nRBC 0.0 0.0 - 0.2 %   Neutrophils Relative % 89 %   Neutro Abs 14.6 (H) 1.7 - 7.7 K/uL   Lymphocytes Relative 5 %   Lymphs Abs 0.9 0.7 - 4.0 K/uL   Monocytes Relative 5 %   Monocytes Absolute 0.9 0.1 - 1.0 K/uL   Eosinophils Relative 0 %   Eosinophils Absolute 0.0 0.0 - 0.5 K/uL   Basophils Relative 0 %   Basophils Absolute 0.0 0.0 - 0.1 K/uL   Immature Granulocytes 1 %   Abs Immature Granulocytes 0.24 (H) 0.00 - 0.07 K/uL  Comprehensive metabolic panel  Result Value Ref Range   Sodium 140 135 - 145 mmol/L   Potassium 4.4 3.5 - 5.1 mmol/L   Chloride 110 98 - 111 mmol/L   CO2 23 22 - 32 mmol/L   Glucose, Bld 144 (H) 70 - 99 mg/dL   BUN 54 (H) 8 - 23 mg/dL   Creatinine, Ser 2.12 (H) 0.61 - 1.24 mg/dL   Calcium 9.3 8.9 - 10.3 mg/dL   Total Protein 6.4 (L) 6.5 - 8.1 g/dL   Albumin 2.9 (L) 3.5 - 5.0 g/dL   AST 30 15 - 41  U/L   ALT 23 0 - 44 U/L   Alkaline Phosphatase 77 38 - 126 U/L   Total Bilirubin 0.8 0.3 - 1.2 mg/dL   GFR, Estimated 29 (L) >60 mL/min   Anion gap 7 5 - 15  C-reactive protein  Result Value Ref Range   CRP 5.0 (H) <1.0 mg/dL  D-dimer, quantitative  Result Value Ref Range   D-Dimer, Quant 0.50 0.00 - 0.50 ug/mL-FEU  Ferritin  Result Value Ref Range   Ferritin 157 24 - 336 ng/mL  Magnesium  Result Value Ref Range   Magnesium 2.1 1.7 - 2.4 mg/dL  Phosphorus  Result Value Ref Range   Phosphorus 4.1 2.5 - 4.6 mg/dL  Protime-INR  Result Value Ref Range   Prothrombin Time 27.2 (H) 11.4 - 15.2 seconds   INR 2.5 (H) 0.8 - 1.2  Glucose, capillary  Result Value Ref Range   Glucose-Capillary 169 (H) 70 - 99 mg/dL  Troponin I (High Sensitivity)  Result Value Ref Range   Troponin I (High Sensitivity) 67 (H) <18 ng/L  Troponin I (High Sensitivity)  Result Value Ref Range   Troponin I (High Sensitivity) 73 (H) <18 ng/L  Troponin I (High Sensitivity)  Result Value Ref Range   Troponin I (High Sensitivity) 83 (H) <18 ng/L  Troponin I (High Sensitivity)  Result Value Ref Range   Troponin I (High Sensitivity) 89 (H) <18 ng/L  Troponin I (High Sensitivity)  Result Value Ref Range   Troponin I (High Sensitivity) 55 (H) <18 ng/L      Assessment & Plan:   Problem List Items Addressed This Visit       Cardiovascular and Mediastinum   Chronic deep vein thrombosis (DVT) of femoral vein of left lower extremity (HCC)    Continue coumadin. Continue to follow with hematology. Call with any concerns. Continue to monitor.       Relevant Medications   amLODipine (NORVASC) 5 MG tablet   Other Relevant Orders   CBC with Differential/Platelet     Respiratory   Pneumonia due to COVID-19 virus    Lungs clear today. Continue to monitor. Call with  any concerns. Continue to monitor.       Relevant Medications   Ipratropium-Albuterol (COMBIVENT) 20-100 MCG/ACT AERS respimat   Other Relevant  Orders   CBC with Differential/Platelet   Acute hypoxemic respiratory failure (HCC)    Lungs clear today. Continue to monitor. Call with any concerns. Continue to monitor.       Relevant Orders   CBC with Differential/Platelet     Digestive   Gastroenteritis due to COVID-19 virus    Resolved. Continue to monitor. Call with any concerns.       Relevant Orders   CBC with Differential/Platelet     Genitourinary   CKD (chronic kidney disease) stage 4, GFR 15-29 ml/min (HCC)    Rechecking labs. Await results. Treat as needed.       Relevant Orders   CBC with Differential/Platelet   Basic metabolic panel   Other Visit Diagnoses     Post-COVID chronic fatigue    -  Primary   Discussed that  this can last 3-6 weeks on average. Continue to monitor closely. Call with any concerns.    Relevant Orders   CBC with Differential/Platelet        Follow up plan: Return in about 4 weeks (around 12/14/2021).   >30 minutes spent with patient and wife today

## 2021-11-16 NOTE — Assessment & Plan Note (Signed)
Resolved. Continue to monitor. Call with any concerns.

## 2021-11-16 NOTE — Assessment & Plan Note (Signed)
Continue coumadin. Continue to follow with hematology. Call with any concerns. Continue to monitor.

## 2021-11-16 NOTE — Assessment & Plan Note (Signed)
Rechecking labs. Await results. Treat as needed.  

## 2021-11-16 NOTE — Assessment & Plan Note (Signed)
Lungs clear today. Continue to monitor. Call with any concerns. Continue to monitor.

## 2021-11-16 NOTE — Patient Instructions (Signed)
Take a vitamin C and zinc supplement until you see me next time.

## 2021-11-17 LAB — CBC WITH DIFFERENTIAL/PLATELET
Basophils Absolute: 0 10*3/uL (ref 0.0–0.2)
Basos: 0 %
EOS (ABSOLUTE): 0.1 10*3/uL (ref 0.0–0.4)
Eos: 1 %
Hematocrit: 44.5 % (ref 37.5–51.0)
Hemoglobin: 14.8 g/dL (ref 13.0–17.7)
Immature Grans (Abs): 0 10*3/uL (ref 0.0–0.1)
Immature Granulocytes: 0 %
Lymphocytes Absolute: 1.4 10*3/uL (ref 0.7–3.1)
Lymphs: 17 %
MCH: 31.9 pg (ref 26.6–33.0)
MCHC: 33.3 g/dL (ref 31.5–35.7)
MCV: 96 fL (ref 79–97)
Monocytes Absolute: 1.2 10*3/uL — ABNORMAL HIGH (ref 0.1–0.9)
Monocytes: 15 %
Neutrophils Absolute: 5.5 10*3/uL (ref 1.4–7.0)
Neutrophils: 67 %
Platelets: 213 10*3/uL (ref 150–450)
RBC: 4.64 x10E6/uL (ref 4.14–5.80)
RDW: 12.9 % (ref 11.6–15.4)
WBC: 8.2 10*3/uL (ref 3.4–10.8)

## 2021-11-17 LAB — BASIC METABOLIC PANEL
BUN/Creatinine Ratio: 10 (ref 10–24)
BUN: 19 mg/dL (ref 8–27)
CO2: 23 mmol/L (ref 20–29)
Calcium: 9 mg/dL (ref 8.6–10.2)
Chloride: 103 mmol/L (ref 96–106)
Creatinine, Ser: 1.89 mg/dL — ABNORMAL HIGH (ref 0.76–1.27)
Glucose: 83 mg/dL (ref 70–99)
Potassium: 5 mmol/L (ref 3.5–5.2)
Sodium: 139 mmol/L (ref 134–144)
eGFR: 34 mL/min/{1.73_m2} — ABNORMAL LOW (ref >59)

## 2021-12-15 ENCOUNTER — Other Ambulatory Visit: Payer: Self-pay

## 2021-12-15 ENCOUNTER — Encounter: Payer: Self-pay | Admitting: Family Medicine

## 2021-12-15 ENCOUNTER — Ambulatory Visit: Payer: Medicare Other | Admitting: Family Medicine

## 2021-12-15 VITALS — BP 96/61 | HR 80 | Temp 97.8°F | Wt 161.4 lb

## 2021-12-15 DIAGNOSIS — U099 Post covid-19 condition, unspecified: Secondary | ICD-10-CM

## 2021-12-15 DIAGNOSIS — G9332 Myalgic encephalomyelitis/chronic fatigue syndrome: Secondary | ICD-10-CM | POA: Diagnosis not present

## 2021-12-15 NOTE — Assessment & Plan Note (Signed)
Feeling about 50% better, but still tired. Will give him another month to see how he's doing. If he continues to be fatigued next visit will check labs and consider cutting down his enlalapril as he keeps a low-normal BP. Continue to monitor. Call with any concerns.

## 2021-12-15 NOTE — Progress Notes (Signed)
BP 96/61    Pulse 80    Temp 97.8 F (36.6 C)    Wt 161 lb 6.4 oz (73.2 kg)    SpO2 98%    BMI 25.28 kg/m    Subjective:    Patient ID: Gregg Thomas, male    DOB: 05-Dec-1931, 87 y.o.   MRN: [id number]  HPI: Gregg Thomas is a 87 y.o. male  Chief Complaint  Patient presents with   Pneumonia    Patient states he is feeling better, has gotten more of his energy back    FATIGUE- Feeling 40-50% better. Nothing bothering him much today, but still not feeling like himself.  Duration:  about 6 weeks Severity: moderate  Onset: sudden Context when symptoms started:   covid pneumonia Symptoms improve with rest: yes  Depressive symptoms: no Stress/anxiety: no Insomnia: no  Snoring: no Observed apnea by bed partner: no Daytime hypersomnolence:no Wakes feeling refreshed: yes History of sleep study: no Dysnea on exertion:  no Orthopnea/PND: no Chest pain: no Chronic cough: no Lower extremity edema: no Arthralgias:no Myalgias: no Weakness: no Rash: no   Relevant past medical, surgical, family and social history reviewed and updated as indicated. Interim medical history since our last visit reviewed. Allergies and medications reviewed and updated.  Review of Systems  Constitutional:  Positive for fatigue. Negative for activity change, appetite change, chills, diaphoresis, fever and unexpected weight change.  Respiratory: Negative.    Cardiovascular: Negative.   Gastrointestinal: Negative.   Musculoskeletal: Negative.   Psychiatric/Behavioral: Negative.     Per HPI unless specifically indicated above     Objective:    BP 96/61    Pulse 80    Temp 97.8 F (36.6 C)    Wt 161 lb 6.4 oz (73.2 kg)    SpO2 98%    BMI 25.28 kg/m   Wt Readings from Last 3 Encounters:  12/15/21 161 lb 6.4 oz (73.2 kg)  11/16/21 158 lb 12.8 oz (72 kg)  10/30/21 170 lb (77.1 kg)    Physical Exam Vitals and nursing note reviewed.  Constitutional:      General: He is not in  acute distress.    Appearance: Normal appearance. He is not ill-appearing, toxic-appearing or diaphoretic.  HENT:     Head: Normocephalic and atraumatic.     Right Ear: External ear normal.     Left Ear: External ear normal.     Nose: Nose normal.     Mouth/Throat:     Mouth: Mucous membranes are moist.     Pharynx: Oropharynx is clear.  Eyes:     General: No scleral icterus.       Right eye: No discharge.        Left eye: No discharge.     Extraocular Movements: Extraocular movements intact.     Conjunctiva/sclera: Conjunctivae normal.     Pupils: Pupils are equal, round, and reactive to light.  Cardiovascular:     Rate and Rhythm: Normal rate and regular rhythm.     Pulses: Normal pulses.     Heart sounds: Normal heart sounds. No murmur heard.   No friction rub. No gallop.  Pulmonary:     Effort: Pulmonary effort is normal. No respiratory distress.     Breath sounds: Normal breath sounds. No stridor. No wheezing, rhonchi or rales.  Chest:     Chest wall: No tenderness.  Musculoskeletal:        General: Normal range of motion.  Cervical back: Normal range of motion and neck supple.  Skin:    General: Skin is warm and dry.     Capillary Refill: Capillary refill takes less than 2 seconds.     Coloration: Skin is not jaundiced or pale.     Findings: No bruising, erythema, lesion or rash.  Neurological:     General: No focal deficit present.     Mental Status: He is alert and oriented to person, place, and time. Mental status is at baseline.  Psychiatric:        Mood and Affect: Mood normal.        Behavior: Behavior normal.        Thought Content: Thought content normal.        Judgment: Judgment normal.    Results for orders placed or performed in visit on 11/16/21  CBC with Differential/Platelet  Result Value Ref Range   WBC 8.2 3.4 - 10.8 x10E3/uL   RBC 4.64 4.14 - 5.80 x10E6/uL   Hemoglobin 14.8 13.0 - 17.7 g/dL   Hematocrit 44.5 37.5 - 51.0 %   MCV 96 79 - 97  fL   MCH 31.9 26.6 - 33.0 pg   MCHC 33.3 31.5 - 35.7 g/dL   RDW 12.9 11.6 - 15.4 %   Platelets 213 150 - 450 x10E3/uL   Neutrophils 67 Not Estab. %   Lymphs 17 Not Estab. %   Monocytes 15 Not Estab. %   Eos 1 Not Estab. %   Basos 0 Not Estab. %   Neutrophils Absolute 5.5 1.4 - 7.0 x10E3/uL   Lymphocytes Absolute 1.4 0.7 - 3.1 x10E3/uL   Monocytes Absolute 1.2 (H) 0.1 - 0.9 x10E3/uL   EOS (ABSOLUTE) 0.1 0.0 - 0.4 x10E3/uL   Basophils Absolute 0.0 0.0 - 0.2 x10E3/uL   Immature Granulocytes 0 Not Estab. %   Immature Grans (Abs) 0.0 0.0 - 0.1 L46T0/PT  Basic metabolic panel  Result Value Ref Range   Glucose 83 70 - 99 mg/dL   BUN 19 8 - 27 mg/dL   Creatinine, Ser 1.89 (H) 0.76 - 1.27 mg/dL   eGFR 34 (L) >59 mL/min/1.73   BUN/Creatinine Ratio 10 10 - 24   Sodium 139 134 - 144 mmol/L   Potassium 5.0 3.5 - 5.2 mmol/L   Chloride 103 96 - 106 mmol/L   CO2 23 20 - 29 mmol/L   Calcium 9.0 8.6 - 10.2 mg/dL      Assessment & Plan:   Problem List Items Addressed This Visit       Other   Post-COVID chronic fatigue - Primary    Feeling about 50% better, but still tired. Will give him another month to see how he's doing. If he continues to be fatigued next visit will check labs and consider cutting down his enlalapril as he keeps a low-normal BP. Continue to monitor. Call with any concerns.         Follow up plan: Return in about 4 weeks (around 01/12/2022).

## 2021-12-26 ENCOUNTER — Other Ambulatory Visit: Payer: Self-pay | Admitting: Internal Medicine

## 2022-01-05 ENCOUNTER — Other Ambulatory Visit: Payer: Self-pay

## 2022-01-05 ENCOUNTER — Ambulatory Visit: Payer: Self-pay

## 2022-01-05 ENCOUNTER — Ambulatory Visit: Payer: Medicare Other | Admitting: Family Medicine

## 2022-01-05 ENCOUNTER — Encounter: Payer: Self-pay | Admitting: Family Medicine

## 2022-01-05 VITALS — BP 118/72 | HR 66 | Temp 97.5°F | Wt 170.6 lb

## 2022-01-05 DIAGNOSIS — R609 Edema, unspecified: Secondary | ICD-10-CM

## 2022-01-05 DIAGNOSIS — L03115 Cellulitis of right lower limb: Secondary | ICD-10-CM

## 2022-01-05 MED ORDER — SULFAMETHOXAZOLE-TRIMETHOPRIM 800-160 MG PO TABS: 1.0000 | ORAL_TABLET | Freq: Two times a day (BID) | ORAL | 0 refills | Status: DC

## 2022-01-05 MED ORDER — HYDROCHLOROTHIAZIDE 25 MG PO TABS: 25.0000 mg | ORAL_TABLET | Freq: Every day | ORAL | 3 refills | Status: DC

## 2022-01-05 NOTE — Progress Notes (Signed)
BP 118/72    Pulse 66    Temp (!) 97.5 F (36.4 C)    Wt 170 lb 9.6 oz (77.4 kg)    SpO2 97%    BMI 26.72 kg/m    Subjective:    Patient ID: Gregg Thomas, male    DOB: January 04, 1931, 88 y.o.   MRN: [id number]  HPI: Gregg Thomas is a 88 y.o. male  Chief Complaint  Patient presents with   Edema    Patient states both his legs have swelling for about 3 days. Both feet and ankles are swollen.    Fall    Patient fell on 12/27/21 going up some steps and scraped both his hands and right leg. Area is now swollen and red, and noticed more bruising.    Has had significant swelling in his lower legs for the past 3 days. No SOB. No wheezing. No fatigue. He had a fall about a week ago and has a wound on his R leg, but his L leg is swelling as well.   SKIN INFECTION Duration:  about a week Location: R lower leg History of trauma in area: yes Pain: yes Quality: tight and hot Severity: mild Redness: yes Swelling: yes Oozing: no Pus: no Fevers: no Nausea/vomiting: no Status: worse Treatments attempted:none  Tetanus: UTD   Relevant past medical, surgical, family and social history reviewed and updated as indicated. Interim medical history since our last visit reviewed. Allergies and medications reviewed and updated.  Review of Systems  Constitutional: Negative.   HENT: Negative.    Respiratory: Negative.    Cardiovascular:  Positive for leg swelling. Negative for chest pain and palpitations.  Musculoskeletal: Negative.   Neurological: Negative.   Psychiatric/Behavioral: Negative.     Per HPI unless specifically indicated above     Objective:    BP 118/72    Pulse 66    Temp (!) 97.5 F (36.4 C)    Wt 170 lb 9.6 oz (77.4 kg)    SpO2 97%    BMI 26.72 kg/m   Wt Readings from Last 3 Encounters:  01/05/22 170 lb 9.6 oz (77.4 kg)  12/15/21 161 lb 6.4 oz (73.2 kg)  11/16/21 158 lb 12.8 oz (72 kg)    Physical Exam Vitals and nursing note reviewed.   Constitutional:      General: He is not in acute distress.    Appearance: Normal appearance. He is not ill-appearing, toxic-appearing or diaphoretic.  HENT:     Head: Normocephalic and atraumatic.     Right Ear: External ear normal.     Left Ear: External ear normal.     Nose: Nose normal.     Mouth/Throat:     Mouth: Mucous membranes are moist.     Pharynx: Oropharynx is clear.  Eyes:     General: No scleral icterus.       Right eye: No discharge.        Left eye: No discharge.     Extraocular Movements: Extraocular movements intact.     Conjunctiva/sclera: Conjunctivae normal.     Pupils: Pupils are equal, round, and reactive to light.  Cardiovascular:     Rate and Rhythm: Normal rate and regular rhythm.     Pulses: Normal pulses.     Heart sounds: Normal heart sounds. No murmur heard.   No friction rub. No gallop.  Pulmonary:     Effort: Pulmonary effort is normal. No respiratory distress.     Breath sounds:  Normal breath sounds. No stridor. No wheezing, rhonchi or rales.  Chest:     Chest wall: No tenderness.  Musculoskeletal:        General: Normal range of motion.     Cervical back: Normal range of motion and neck supple.     Right lower leg: Edema (3+ edema) present.     Left lower leg: Edema (3+ edema) present.  Skin:    General: Skin is warm and dry.     Capillary Refill: Capillary refill takes less than 2 seconds.     Coloration: Skin is not jaundiced or pale.     Findings: Erythema (red, hot R shin with open wound) present. No bruising, lesion or rash.  Neurological:     General: No focal deficit present.     Mental Status: He is alert and oriented to person, place, and time. Mental status is at baseline.  Psychiatric:        Mood and Affect: Mood normal.        Behavior: Behavior normal.        Thought Content: Thought content normal.        Judgment: Judgment normal.    Results for orders placed or performed in visit on 11/16/21  CBC with  Differential/Platelet  Result Value Ref Range   WBC 8.2 3.4 - 10.8 x10E3/uL   RBC 4.64 4.14 - 5.80 x10E6/uL   Hemoglobin 14.8 13.0 - 17.7 g/dL   Hematocrit 44.5 37.5 - 51.0 %   MCV 96 79 - 97 fL   MCH 31.9 26.6 - 33.0 pg   MCHC 33.3 31.5 - 35.7 g/dL   RDW 12.9 11.6 - 15.4 %   Platelets 213 150 - 450 x10E3/uL   Neutrophils 67 Not Estab. %   Lymphs 17 Not Estab. %   Monocytes 15 Not Estab. %   Eos 1 Not Estab. %   Basos 0 Not Estab. %   Neutrophils Absolute 5.5 1.4 - 7.0 x10E3/uL   Lymphocytes Absolute 1.4 0.7 - 3.1 x10E3/uL   Monocytes Absolute 1.2 (H) 0.1 - 0.9 x10E3/uL   EOS (ABSOLUTE) 0.1 0.0 - 0.4 x10E3/uL   Basophils Absolute 0.0 0.0 - 0.2 x10E3/uL   Immature Granulocytes 0 Not Estab. %   Immature Grans (Abs) 0.0 0.0 - 0.1 M21R1/NB  Basic metabolic panel  Result Value Ref Range   Glucose 83 70 - 99 mg/dL   BUN 19 8 - 27 mg/dL   Creatinine, Ser 1.89 (H) 0.76 - 1.27 mg/dL   eGFR 34 (L) >59 mL/min/1.73   BUN/Creatinine Ratio 10 10 - 24   Sodium 139 134 - 144 mmol/L   Potassium 5.0 3.5 - 5.2 mmol/L   Chloride 103 96 - 106 mmol/L   CO2 23 20 - 29 mmol/L   Calcium 9.0 8.6 - 10.2 mg/dL      Assessment & Plan:   Problem List Items Addressed This Visit   None Visit Diagnoses     Cellulitis of right leg    -  Primary   Will treat with bactrim. UTD on Td. Continue wound care. Call with any concerns.    Peripheral edema       Stop amlodipine. Elevated. Treat cellulitis. Start HCTZ. If not better next week will check [place].         Follow up plan: Return Thursday.

## 2022-01-05 NOTE — Telephone Encounter (Signed)
° °  Chief Complaint: Swelling to feet and ankles Symptoms: Swelling, had a fall 1 week ago Frequency: Started Tuesday Pertinent Negatives: Patient denies pain, shortness of breath Disposition: [] ED /[] Urgent Care (no appt availability in office) / [] Appointment(In office/virtual)/ []  [facility]/ [] Home Care/ [] Refused Recommended Disposition /[] [facility]/ []  Follow-up with PCP Additional Notes:    Reason for Disposition  [1] MILD swelling of both ankles (i.e., pedal edema) AND [2] new-onset or worsening  Answer Assessment - Initial Assessment Questions 1. ONSET: "When did the swelling start?" (e.g., minutes, hours, days)     Tuesday 2. LOCATION: "What part of the leg is swollen?"  "Are both legs swollen or just one leg?"     Both legs 3. SEVERITY: "How bad is the swelling?" (e.g., localized; mild, moderate, severe)  - Localized - small area of swelling localized to one leg  - MILD pedal edema - swelling limited to foot and ankle, pitting edema < 1/4 inch (6 mm) deep, rest and elevation eliminate most or all swelling  - MODERATE edema - swelling of lower leg to knee, pitting edema > 1/4 inch (6 mm) deep, rest and elevation only partially reduce swelling  - SEVERE edema - swelling extends above knee, facial or hand swelling present      Mild 4. REDNESS: "Does the swelling look red or infected?"     Skin tight, red 5. PAIN: "Is the swelling painful to touch?" If Yes, ask: "How painful is it?"   (Scale 1-10; mild, moderate or severe)     No 6. FEVER: "Do you have a fever?" If Yes, ask: "What is it, how was it measured, and when did it start?"      No 7. CAUSE: "What do you think is causing the leg swelling?"     Unsure 8. MEDICAL HISTORY: "Do you have a history of heart failure, kidney disease, liver failure, or cancer?"     Yes 9. RECURRENT SYMPTOM: "Have you had leg swelling before?" If Yes, ask: "When was the last time?" "What happened that time?"      Yes 10. OTHER SYMPTOMS: "Do you have any other symptoms?" (e.g., chest pain, difficulty breathing)       No 11. PREGNANCY: "Is there any chance you are pregnant?" "When was your last menstrual period?"       N/A  Protocols used: Leg Swelling and Edema-A-AH

## 2022-01-11 ENCOUNTER — Other Ambulatory Visit: Payer: Self-pay

## 2022-01-11 ENCOUNTER — Ambulatory Visit: Payer: Medicare Other | Admitting: Family Medicine

## 2022-01-11 ENCOUNTER — Encounter: Payer: Self-pay | Admitting: Family Medicine

## 2022-01-11 VITALS — BP 116/73 | HR 66 | Temp 97.5°F | Wt 161.0 lb

## 2022-01-11 DIAGNOSIS — D492 Neoplasm of unspecified behavior of bone, soft tissue, and skin: Secondary | ICD-10-CM

## 2022-01-11 DIAGNOSIS — L03115 Cellulitis of right lower limb: Secondary | ICD-10-CM

## 2022-01-11 DIAGNOSIS — R609 Edema, unspecified: Secondary | ICD-10-CM | POA: Diagnosis not present

## 2022-01-11 MED ORDER — HYDROCHLOROTHIAZIDE 25 MG PO TABS: 25.0000 mg | ORAL_TABLET | Freq: Every day | ORAL | 3 refills | Status: DC | PRN

## 2022-01-11 NOTE — Progress Notes (Signed)
BP 116/73    Pulse 66    Temp (!) 97.5 F (36.4 C)    Wt 161 lb (73 kg)    SpO2 95%    BMI 25.22 kg/m    Subjective:    Patient ID: Gregg Thomas, male    DOB: 1931-07-14, 88 y.o.   MRN: [id number]  HPI: Gregg Thomas is a 88 y.o. male  Chief Complaint  Patient presents with   Edema   Cellulitis   bump on leg    Patient states he noticed a spot on his left lower leg a couple weeks ago   SKIN INFECTION Duration: 14 days Location: R lower leg History of trauma in area: yes Pain: no Quality: no pain Severity: mild Redness: yes Swelling: no Oozing: no Pus: no Fevers: no Nausea/vomiting: no Status: better Treatments attempted:antibiotics  Tetanus: UTD  Relevant past medical, surgical, family and social history reviewed and updated as indicated. Interim medical history since our last visit reviewed. Allergies and medications reviewed and updated.  Review of Systems  Constitutional: Negative.   Respiratory: Negative.    Cardiovascular: Negative.   Gastrointestinal: Negative.   Musculoskeletal: Negative.   Neurological: Negative.   Psychiatric/Behavioral: Negative.     Per HPI unless specifically indicated above     Objective:    BP 116/73    Pulse 66    Temp (!) 97.5 F (36.4 C)    Wt 161 lb (73 kg)    SpO2 95%    BMI 25.22 kg/m   Wt Readings from Last 3 Encounters:  01/11/22 161 lb (73 kg)  01/05/22 170 lb 9.6 oz (77.4 kg)  12/15/21 161 lb 6.4 oz (73.2 kg)    Physical Exam Vitals and nursing note reviewed.  Constitutional:      General: He is not in acute distress.    Appearance: Normal appearance. He is not ill-appearing, toxic-appearing or diaphoretic.  HENT:     Head: Normocephalic and atraumatic.     Right Ear: External ear normal.     Left Ear: External ear normal.     Nose: Nose normal.     Mouth/Throat:     Mouth: Mucous membranes are moist.     Pharynx: Oropharynx is clear.  Eyes:     General: No scleral icterus.        Right eye: No discharge.        Left eye: No discharge.     Extraocular Movements: Extraocular movements intact.     Conjunctiva/sclera: Conjunctivae normal.     Pupils: Pupils are equal, round, and reactive to light.  Cardiovascular:     Rate and Rhythm: Normal rate and regular rhythm.     Pulses: Normal pulses.     Heart sounds: Normal heart sounds. No murmur heard.   No friction rub. No gallop.  Pulmonary:     Effort: Pulmonary effort is normal. No respiratory distress.     Breath sounds: Normal breath sounds. No stridor. No wheezing, rhonchi or rales.  Chest:     Chest wall: No tenderness.  Musculoskeletal:        General: Normal range of motion.     Cervical back: Normal range of motion and neck supple.     Right lower leg: No edema.     Left lower leg: Edema (trace) present.  Skin:    General: Skin is warm and dry.     Capillary Refill: Capillary refill takes less than 2 seconds.  Coloration: Skin is not jaundiced or pale.     Findings: No bruising, erythema, lesion or rash.  Neurological:     General: No focal deficit present.     Mental Status: He is alert and oriented to person, place, and time. Mental status is at baseline.  Psychiatric:        Mood and Affect: Mood normal.        Behavior: Behavior normal.        Thought Content: Thought content normal.        Judgment: Judgment normal.    Results for orders placed or performed in visit on 11/16/21  CBC with Differential/Platelet  Result Value Ref Range   WBC 8.2 3.4 - 10.8 x10E3/uL   RBC 4.64 4.14 - 5.80 x10E6/uL   Hemoglobin 14.8 13.0 - 17.7 g/dL   Hematocrit 44.5 37.5 - 51.0 %   MCV 96 79 - 97 fL   MCH 31.9 26.6 - 33.0 pg   MCHC 33.3 31.5 - 35.7 g/dL   RDW 12.9 11.6 - 15.4 %   Platelets 213 150 - 450 x10E3/uL   Neutrophils 67 Not Estab. %   Lymphs 17 Not Estab. %   Monocytes 15 Not Estab. %   Eos 1 Not Estab. %   Basos 0 Not Estab. %   Neutrophils Absolute 5.5 1.4 - 7.0 x10E3/uL   Lymphocytes  Absolute 1.4 0.7 - 3.1 x10E3/uL   Monocytes Absolute 1.2 (H) 0.1 - 0.9 x10E3/uL   EOS (ABSOLUTE) 0.1 0.0 - 0.4 x10E3/uL   Basophils Absolute 0.0 0.0 - 0.2 x10E3/uL   Immature Granulocytes 0 Not Estab. %   Immature Grans (Abs) 0.0 0.0 - 0.1 Z12W5/YK  Basic metabolic panel  Result Value Ref Range   Glucose 83 70 - 99 mg/dL   BUN 19 8 - 27 mg/dL   Creatinine, Ser 1.89 (H) 0.76 - 1.27 mg/dL   eGFR 34 (L) >59 mL/min/1.73   BUN/Creatinine Ratio 10 10 - 24   Sodium 139 134 - 144 mmol/L   Potassium 5.0 3.5 - 5.2 mmol/L   Chloride 103 96 - 106 mmol/L   CO2 23 20 - 29 mmol/L   Calcium 9.0 8.6 - 10.2 mg/dL      Assessment & Plan:   Problem List Items Addressed This Visit   None Visit Diagnoses     Peripheral edema    -  Primary   Significantly better. Will chang HCTZ to PRN and continue to monitor. Recheck 1 month. Call wiht any concerns.    Relevant Orders   Basic metabolic panel   Cellulitis of right leg       Significantly improved. Finish abx. Call with any concerns. Continue to monitor.    Neoplasm of skin       Seeing dermatology next week. Encourged them to show them for excision        Follow up plan: Return in about 4 weeks (around 02/08/2022) for OK to cancel appt on 01/25/22.

## 2022-01-12 ENCOUNTER — Other Ambulatory Visit: Payer: Self-pay | Admitting: Family Medicine

## 2022-01-12 ENCOUNTER — Ambulatory Visit: Payer: Medicare Other | Admitting: Family Medicine

## 2022-01-12 DIAGNOSIS — N289 Disorder of kidney and ureter, unspecified: Secondary | ICD-10-CM

## 2022-01-12 LAB — BASIC METABOLIC PANEL
BUN/Creatinine Ratio: 11 (ref 10–24)
BUN: 29 mg/dL — ABNORMAL HIGH (ref 8–27)
CO2: 21 mmol/L (ref 20–29)
Calcium: 9.6 mg/dL (ref 8.6–10.2)
Chloride: 101 mmol/L (ref 96–106)
Creatinine, Ser: 2.76 mg/dL — ABNORMAL HIGH (ref 0.76–1.27)
Glucose: 75 mg/dL (ref 70–99)
Potassium: 5.2 mmol/L (ref 3.5–5.2)
Sodium: 137 mmol/L (ref 134–144)
eGFR: 21 mL/min/{1.73_m2} — ABNORMAL LOW (ref >59)

## 2022-01-24 ENCOUNTER — Ambulatory Visit: Payer: Medicare Other | Admitting: Family Medicine

## 2022-01-25 ENCOUNTER — Ambulatory Visit: Payer: Medicare Other | Admitting: Family Medicine

## 2022-01-26 ENCOUNTER — Other Ambulatory Visit: Payer: Self-pay

## 2022-01-26 ENCOUNTER — Other Ambulatory Visit: Payer: Medicare Other

## 2022-01-26 DIAGNOSIS — N289 Disorder of kidney and ureter, unspecified: Secondary | ICD-10-CM

## 2022-01-27 LAB — BASIC METABOLIC PANEL
BUN/Creatinine Ratio: 14 (ref 10–24)
BUN: 26 mg/dL (ref 8–27)
CO2: 23 mmol/L (ref 20–29)
Calcium: 9.4 mg/dL (ref 8.6–10.2)
Chloride: 107 mmol/L — ABNORMAL HIGH (ref 96–106)
Creatinine, Ser: 1.82 mg/dL — ABNORMAL HIGH (ref 0.76–1.27)
Glucose: 89 mg/dL (ref 70–99)
Potassium: 4.9 mmol/L (ref 3.5–5.2)
Sodium: 141 mmol/L (ref 134–144)
eGFR: 35 mL/min/{1.73_m2} — ABNORMAL LOW (ref >59)

## 2022-02-08 ENCOUNTER — Ambulatory Visit: Payer: Medicare Other | Admitting: Family Medicine

## 2022-02-08 ENCOUNTER — Other Ambulatory Visit: Payer: Self-pay

## 2022-02-08 ENCOUNTER — Encounter: Payer: Self-pay | Admitting: Family Medicine

## 2022-02-08 VITALS — BP 106/70 | HR 65 | Ht 67.0 in | Wt 165.2 lb

## 2022-02-08 DIAGNOSIS — N289 Disorder of kidney and ureter, unspecified: Secondary | ICD-10-CM

## 2022-02-08 DIAGNOSIS — R609 Edema, unspecified: Secondary | ICD-10-CM

## 2022-02-08 DIAGNOSIS — R600 Localized edema: Secondary | ICD-10-CM

## 2022-02-08 NOTE — Progress Notes (Signed)
BP 106/70    Pulse 65    Ht '5\' 7"'  (1.702 m)    Wt 165 lb 3.2 oz (74.9 kg)    SpO2 96%    BMI 25.87 kg/m    Subjective:    Patient ID: Gregg Thomas, male    DOB: 12-07-1931, 88 y.o.   MRN: [id number]  HPI: Gregg Thomas is a 88 y.o. male  Chief Complaint  Patient presents with   Leg Swelling   HYPERTENSION- has had no more swelling in his legs. Has not been taking his HCTZ. Feeling well. No concerns.  Hypertension status: controlled  Satisfied with current treatment? yes Duration of hypertension: chronic BP monitoring frequency:  rarely BP medication side effects:  no Medication compliance: excellent compliance Previous BP meds:enlalapril, HCTZ Aspirin: yes Recurrent headaches: no Visual changes: no Palpitations: no Dyspnea: no Chest pain: no Lower extremity edema: no Dizzy/lightheaded: no  Relevant past medical, surgical, family and social history reviewed and updated as indicated. Interim medical history since our last visit reviewed. Allergies and medications reviewed and updated.  Review of Systems  Constitutional: Negative.   Respiratory: Negative.    Cardiovascular: Negative.   Gastrointestinal: Negative.   Musculoskeletal: Negative.   Neurological: Negative.   Psychiatric/Behavioral: Negative.     Per HPI unless specifically indicated above     Objective:    BP 106/70    Pulse 65    Ht '5\' 7"'  (1.702 m)    Wt 165 lb 3.2 oz (74.9 kg)    SpO2 96%    BMI 25.87 kg/m   Wt Readings from Last 3 Encounters:  02/08/22 165 lb 3.2 oz (74.9 kg)  01/11/22 161 lb (73 kg)  01/05/22 170 lb 9.6 oz (77.4 kg)    Physical Exam Vitals and nursing note reviewed.  Constitutional:      General: He is not in acute distress.    Appearance: Normal appearance. He is not ill-appearing, toxic-appearing or diaphoretic.  HENT:     Head: Normocephalic and atraumatic.     Right Ear: External ear normal.     Left Ear: External ear normal.     Nose: Nose normal.      Mouth/Throat:     Mouth: Mucous membranes are moist.     Pharynx: Oropharynx is clear.  Eyes:     General: No scleral icterus.       Right eye: No discharge.        Left eye: No discharge.     Extraocular Movements: Extraocular movements intact.     Conjunctiva/sclera: Conjunctivae normal.     Pupils: Pupils are equal, round, and reactive to light.  Cardiovascular:     Rate and Rhythm: Normal rate and regular rhythm.     Pulses: Normal pulses.     Heart sounds: Normal heart sounds. No murmur heard.   No friction rub. No gallop.  Pulmonary:     Effort: Pulmonary effort is normal. No respiratory distress.     Breath sounds: Normal breath sounds. No stridor. No wheezing, rhonchi or rales.  Chest:     Chest wall: No tenderness.  Musculoskeletal:        General: Normal range of motion.     Cervical back: Normal range of motion and neck supple.     Right lower leg: No edema.     Left lower leg: No edema.  Skin:    General: Skin is warm and dry.     Capillary Refill: Capillary refill  takes less than 2 seconds.     Coloration: Skin is not jaundiced or pale.     Findings: No bruising, erythema, lesion or rash.  Neurological:     General: No focal deficit present.     Mental Status: He is alert and oriented to person, place, and time. Mental status is at baseline.  Psychiatric:        Mood and Affect: Mood normal.        Behavior: Behavior normal.        Thought Content: Thought content normal.        Judgment: Judgment normal.    Results for orders placed or performed in visit on 96/29/52  Basic metabolic panel  Result Value Ref Range   Glucose 89 70 - 99 mg/dL   BUN 26 8 - 27 mg/dL   Creatinine, Ser 1.82 (H) 0.76 - 1.27 mg/dL   eGFR 35 (L) >59 mL/min/1.73   BUN/Creatinine Ratio 14 10 - 24   Sodium 141 134 - 144 mmol/L   Potassium 4.9 3.5 - 5.2 mmol/L   Chloride 107 (H) 96 - 106 mmol/L   CO2 23 20 - 29 mmol/L   Calcium 9.4 8.6 - 10.2 mg/dL      Assessment & Plan:    Problem List Items Addressed This Visit   None Visit Diagnoses     Peripheral edema    -  Primary   Resolved. Continue HCTZ only PRN. Rechecking BMP today. Call with any concerns.    Abnormal kidney function       Now only taking HCTZ PRN. Rechecking BMP today. Call with any concerns.    Relevant Orders   Basic metabolic panel        Follow up plan: Return End of April.

## 2022-02-09 LAB — BASIC METABOLIC PANEL
BUN/Creatinine Ratio: 13 (ref 10–24)
BUN: 24 mg/dL (ref 8–27)
CO2: 23 mmol/L (ref 20–29)
Calcium: 9.9 mg/dL (ref 8.6–10.2)
Chloride: 106 mmol/L (ref 96–106)
Creatinine, Ser: 1.87 mg/dL — ABNORMAL HIGH (ref 0.76–1.27)
Glucose: 87 mg/dL (ref 70–99)
Potassium: 5 mmol/L (ref 3.5–5.2)
Sodium: 142 mmol/L (ref 134–144)
eGFR: 34 mL/min/{1.73_m2} — ABNORMAL LOW (ref >59)

## 2022-03-06 ENCOUNTER — Encounter: Payer: Self-pay | Admitting: Internal Medicine

## 2022-03-06 ENCOUNTER — Other Ambulatory Visit: Payer: Self-pay

## 2022-03-06 ENCOUNTER — Inpatient Hospital Stay (HOSPITAL_BASED_OUTPATIENT_CLINIC_OR_DEPARTMENT_OTHER): Payer: Medicare Other | Admitting: Internal Medicine

## 2022-03-06 ENCOUNTER — Inpatient Hospital Stay: Payer: Medicare Other | Attending: Internal Medicine | Admitting: Internal Medicine

## 2022-03-06 DIAGNOSIS — N183 Chronic kidney disease, stage 3 unspecified: Secondary | ICD-10-CM | POA: Insufficient documentation

## 2022-03-06 DIAGNOSIS — I82512 Chronic embolism and thrombosis of left femoral vein: Secondary | ICD-10-CM | POA: Diagnosis not present

## 2022-03-06 DIAGNOSIS — Z823 Family history of stroke: Secondary | ICD-10-CM | POA: Insufficient documentation

## 2022-03-06 DIAGNOSIS — Z8249 Family history of ischemic heart disease and other diseases of the circulatory system: Secondary | ICD-10-CM | POA: Insufficient documentation

## 2022-03-06 DIAGNOSIS — M791 Myalgia, unspecified site: Secondary | ICD-10-CM | POA: Diagnosis not present

## 2022-03-06 DIAGNOSIS — Z79899 Other long term (current) drug therapy: Secondary | ICD-10-CM | POA: Insufficient documentation

## 2022-03-06 DIAGNOSIS — Z86711 Personal history of pulmonary embolism: Secondary | ICD-10-CM | POA: Insufficient documentation

## 2022-03-06 DIAGNOSIS — Z7901 Long term (current) use of anticoagulants: Secondary | ICD-10-CM | POA: Insufficient documentation

## 2022-03-06 DIAGNOSIS — M2550 Pain in unspecified joint: Secondary | ICD-10-CM | POA: Insufficient documentation

## 2022-03-06 DIAGNOSIS — Z888 Allergy status to other drugs, medicaments and biological substances status: Secondary | ICD-10-CM | POA: Diagnosis not present

## 2022-03-06 DIAGNOSIS — R413 Other amnesia: Secondary | ICD-10-CM | POA: Diagnosis not present

## 2022-03-06 DIAGNOSIS — C44310 Basal cell carcinoma of skin of unspecified parts of face: Secondary | ICD-10-CM | POA: Insufficient documentation

## 2022-03-06 LAB — CBC WITH DIFFERENTIAL/PLATELET
Abs Immature Granulocytes: 0.01 10*3/uL (ref 0.00–0.07)
Basophils Absolute: 0 10*3/uL (ref 0.0–0.1)
Basophils Relative: 0 %
Eosinophils Absolute: 0.1 10*3/uL (ref 0.0–0.5)
Eosinophils Relative: 3 %
HCT: 41.1 % (ref 39.0–52.0)
Hemoglobin: 13.5 g/dL (ref 13.0–17.0)
Immature Granulocytes: 0 %
Lymphocytes Relative: 32 %
Lymphs Abs: 1.6 10*3/uL (ref 0.7–4.0)
MCH: 32.5 pg (ref 26.0–34.0)
MCHC: 32.8 g/dL (ref 30.0–36.0)
MCV: 98.8 fL (ref 80.0–100.0)
Monocytes Absolute: 0.7 10*3/uL (ref 0.1–1.0)
Monocytes Relative: 13 %
Neutro Abs: 2.7 10*3/uL (ref 1.7–7.7)
Neutrophils Relative %: 52 %
Platelets: 202 10*3/uL (ref 150–400)
RBC: 4.16 MIL/uL — ABNORMAL LOW (ref 4.22–5.81)
RDW: 13.7 % (ref 11.5–15.5)
WBC: 5.1 10*3/uL (ref 4.0–10.5)
nRBC: 0 % (ref 0.0–0.2)

## 2022-03-06 LAB — BASIC METABOLIC PANEL WITH GFR
Anion gap: 6 (ref 5–15)
BUN: 21 mg/dL (ref 8–23)
CO2: 26 mmol/L (ref 22–32)
Calcium: 9.4 mg/dL (ref 8.9–10.3)
Chloride: 109 mmol/L (ref 98–111)
Creatinine, Ser: 1.99 mg/dL — ABNORMAL HIGH (ref 0.61–1.24)
GFR, Estimated: 32 mL/min — ABNORMAL LOW
Glucose, Bld: 77 mg/dL (ref 70–99)
Potassium: 4.4 mmol/L (ref 3.5–5.1)
Sodium: 141 mmol/L (ref 135–145)

## 2022-03-06 LAB — PROTIME-INR
INR: 2.1 — ABNORMAL HIGH (ref 0.8–1.2)
Prothrombin Time: 23.6 s — ABNORMAL HIGH (ref 11.4–15.2)

## 2022-03-06 NOTE — Assessment & Plan Note (Addendum)
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 STABLE on Coumadin/warfarin- on 5 mg  A day;  [no major fluctuations noted on hedgehog inhibitor].  INR from today-pending-STABE.  ? ?# Basal cell carcinoma: on Hedgehog inhibitor [Dr.Dasher]; Intermittent STABLE.  ? ?#CKD-III [GFR 30s]-STABLE; Dr.Lateef.  ? ?# DISPOSITION: ?# PT/INR every month x 6  ?# in 6 months- MD; cbc/bmp/Pt/INR-Dr.B ? ?Cc;  ?

## 2022-03-06 NOTE — Progress Notes (Signed)
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  Accompanied by his wife.  Gregg Thomas 88 y.o.  male history of chronic left lower extremity DVT on Coumadin and history of CKD-III-IV; on erivedge for [facility] is here for follow-up.   Patient was admitted to the hospital in November 2022 for COVID-pneumonia.  He has recovered fairly well.  Patient continues to be on erivedge intermittently for basal cell carcinoma-  Denies any weight loss.  Denies any nausea vomiting.  No diarrhea.   Review of Systems  Constitutional:  Negative for chills, diaphoresis, fever, malaise/fatigue and weight loss.  HENT:  Negative for nosebleeds and sore throat.   Eyes:  Negative for double vision.  Respiratory:  Negative for cough, hemoptysis, sputum production, shortness of breath and wheezing.   Cardiovascular:  Negative for chest pain, palpitations, orthopnea and leg swelling.  Gastrointestinal:  Negative for abdominal pain, blood in stool, constipation, diarrhea, heartburn, melena, nausea and vomiting.  Genitourinary:  Negative for dysuria, frequency and urgency.  Musculoskeletal:  Positive for joint pain and myalgias. Negative  for back pain.  Skin: Negative.  Negative for itching and rash.  Neurological:  Negative for dizziness, tingling, focal weakness, weakness and headaches.  Endo/Heme/Allergies:  Bruises/bleeds easily.  Psychiatric/Behavioral:  Positive for memory loss. Negative for depression. The patient is not nervous/anxious and does not have insomnia.     MEDICAL HISTORY:  Past Medical History:  Diagnosis Date   Chronic kidney disease    Clotting disorder ([place])    Heart murmur    Skin cancer     SURGICAL HISTORY: Past Surgical History:  Procedure Laterality Date   biopsy Right 08/26/2019   temple biopsy - dr.dasher    EYE SURGERY Right    benign growth on right eye    KNEE SURGERY Right    MELANOMA EXCISION Bilateral    lower back     SOCIAL HISTORY: Social History   Socioeconomic History   Marital status: Married    Spouse name: Not on file   Number of children: Not on file   Years of education: 13 years    Highest education level: Some college, no degree  Occupational History   Occupation: retired  Tobacco Use   Smoking status: Never   Smokeless tobacco: Never  Vaping Use   Vaping Use: Never used  Substance and Sexual Activity   Alcohol use: No   Drug use: No   Sexual activity: Not Currently  Other Topics Concern   Not on file  Social History Narrative   [place] 2 times a week, alk 3 times a week    Social Determinants of Health   Financial Resource Strain: Low Risk    Difficulty of Paying Living Expenses: Not hard at all  Food Insecurity: No Food Insecurity   Worried About Charity fundraiser in the Last Year: Never true   Ran Out of Food in the Last Year: Never true  Transportation Needs: No Transportation Needs   Lack of Transportation (Medical): No   Lack of Transportation (Non-Medical): No  Physical Activity: Sufficiently Active   Days of Exercise per Week: 5 days   Minutes of Exercise per Session:  30 min  Stress: No Stress Concern Present   Feeling of Stress : Not at all  Social Connections: Not on file  Intimate Partner Violence: Not on file    FAMILY HISTORY: Family History  Problem Relation Age of Onset   Stroke Mother    Cerebral aneurysm Father     ALLERGIES:  is allergic to amlodipine.  MEDICATIONS:  Current Outpatient Medications  Medication Sig Dispense Refill   ascorbic acid (VITAMIN C) 1000 MG tablet Take by mouth.     cetirizine (ZYRTEC) 5 MG tablet Take 1 tablet (5 mg total) by mouth daily. 30 tablet 0   Cholecalciferol (VITAMIN D3 PO) Take 1 tablet by mouth daily.     Cyanocobalamin (VITAMIN B-12 ER PO) Take by mouth daily at 6 (six) AM.     donepezil (ARICEPT) 5 MG tablet Take 1 tablet (5 mg total) by mouth at bedtime. 90 tablet 1   ELDERBERRY PO Take 1 tablet by mouth daily.     enalapril (VASOTEC) 20 MG tablet Take 20 mg by mouth 2 (two) times daily.     ERIVEDGE 150 MG capsule Take 150 mg by mouth daily.     fluticasone (FLONASE) 50 MCG/ACT nasal spray Place 2 sprays into both nostrils daily. 16 g 6   Grape Seed Extract 50 MG CAPS Take 1,300 mg by mouth daily.      Ipratropium-Albuterol (COMBIVENT) 20-100 MCG/ACT AERS respimat Inhale 1 puff into the lungs every 6 (six) hours.     warfarin (COUMADIN) 5 MG tablet Take 1 tablet (5 mg total) by mouth one time only at 6 PM. 90 tablet 0   zinc gluconate 50 MG tablet Take by mouth.     No current facility-administered medications for this visit.      Marland Kitchen  PHYSICAL EXAMINATION: ECOG PERFORMANCE STATUS: 0 - Asymptomatic  Vitals:   03/06/22 1455  BP: (!) 163/86  Pulse: 60  Temp: (!) 97.5 F (36.4 C)  SpO2: 96%   Filed Weights   03/06/22 1455  Weight: 164 lb 6.4 oz (74.6 kg)    Physical Exam Constitutional:      Comments: Elderly appearing Caucasian male patient accompanied by his wife.  Is walking himself.  HENT:     Head: Normocephalic and atraumatic.     Mouth/Throat:     Pharynx: No  oropharyngeal exudate.  Eyes:     Pupils: Pupils are equal, round, and reactive to light.  Cardiovascular:     Rate and Rhythm: Normal rate and regular rhythm.  Pulmonary:     Effort: Pulmonary effort is normal. No respiratory distress.     Breath sounds: Normal breath sounds. No wheezing.  Abdominal:     General: Bowel sounds are normal. There is no distension.     Palpations: Abdomen is soft. There is no mass.     Tenderness: There is no abdominal tenderness. There is no guarding or rebound.  Musculoskeletal:        General: No tenderness. Normal range of motion.     Cervical back: Normal range of  motion and neck supple.  Skin:    General: Skin is warm.  Neurological:     Mental Status: He is alert and oriented to person, place, and time.  Psychiatric:        Mood and Affect: Affect normal.     LABORATORY DATA:  I have reviewed the data as listed Lab Results  Component Value Date   WBC 5.1 03/06/2022   HGB 13.5 03/06/2022   HCT 41.1 03/06/2022   MCV 98.8 03/06/2022   PLT 202 03/06/2022   Recent Labs    10/31/21 1010 11/01/21 0722 11/02/21 0637 11/16/21 1516 01/26/22 1347 02/08/22 1455 03/06/22 1502  NA 136 137 140   < > 141 142 141  K 4.1 4.0 4.4   < > 4.9 5.0 4.4  CL 104 108 110   < > 107* 106 109  CO2 23 21* 23   < > '23 23 26  ' GLUCOSE 111* 165* 144*   < > 89 87 77  BUN 27* 43* 54*   < > '26 24 21  ' CREATININE 2.11* 2.12* 2.12*   < > 1.82* 1.87* 1.99*  CALCIUM 8.9 9.4 9.3   < > 9.4 9.9 9.4  GFRNONAA 30* 29* 29*  --   --   --  32*  PROT 6.7 6.8 6.4*  --   --   --   --   ALBUMIN 3.5 3.4* 2.9*  --   --   --   --   AST 30 32 30  --   --   --   --   ALT '25 23 23  ' --   --   --   --   ALKPHOS 74 78 77  --   --   --   --   BILITOT 1.2 0.7 0.8  --   --   --   --    < > = values in this interval not displayed.    RADIOGRAPHIC STUDIES: I have personally reviewed the radiological images as listed and agreed with the findings in the report. No results  found.  ASSESSMENT & PLAN:   Chronic deep vein thrombosis (DVT) of femoral vein of left lower extremity (HCC) # Left lower extremity chronic DVT/PE [7616]-WV new thromboembolic events. STABLE on Coumadin/warfarin- on 5 mg  A day;  [no major fluctuations noted on hedgehog inhibitor].  INR from today-pending-STABE.   # Basal cell carcinoma: on Hedgehog inhibitor [Dr.Dasher]; Intermittent STABLE.   #CKD-III [GFR 30s]-STABLE; Dr.Lateef.   # DISPOSITION: # PT/INR every month x 6  # in 6 months- MD; cbc/bmp/Pt/INR-Dr.B  Cc;   All questions were answered. The patient knows to call the clinic with any problems, questions or concerns.    Cammie Sickle, MD 03/06/2022 3:42 PM

## 2022-03-07 ENCOUNTER — Telehealth: Payer: Self-pay

## 2022-03-07 NOTE — Telephone Encounter (Signed)
-----   Message from Cammie Sickle, MD sent at 03/06/2022  5:15 PM EST ----- ?Please inform patient that his INR is in the therapeutic range. I would recommend continue take his Coumadin at the current dose. Continue labs/follow up as planned. Thank you, Dr. Doren Custard ?

## 2022-03-07 NOTE — Telephone Encounter (Signed)
Pt notified by message

## 2022-03-07 NOTE — Progress Notes (Signed)
Pt notified by message

## 2022-03-08 ENCOUNTER — Encounter: Payer: Self-pay | Admitting: Internal Medicine

## 2022-03-08 NOTE — Progress Notes (Signed)
Pt notified by message

## 2022-03-19 ENCOUNTER — Other Ambulatory Visit: Payer: Self-pay | Admitting: Family Medicine

## 2022-03-21 NOTE — Telephone Encounter (Signed)
Requested Prescriptions  ?Pending Prescriptions Disp Refills  ?? cetirizine (ZYRTEC) 5 MG tablet [Pharmacy Med Name: CETIRIZINE HCL 5 MG TABLET] 30 tablet 0  ?  Sig: Take 1 tablet (5 mg total) by mouth daily.  ?  ? Ear, Nose, and Throat:  Antihistamines 2 Failed - 03/19/2022  1:17 PM  ?  ?  Failed - Cr in normal range and within 360 days  ?  Creatinine  ?Date Value Ref Range Status  ?05/03/2014 1.71 (H) 0.60 - 1.30 mg/dL Final  ? ?Creatinine, Ser  ?Date Value Ref Range Status  ?03/06/2022 1.99 (H) 0.61 - 1.24 mg/dL Final  ?   ?  ?  Passed - Valid encounter within last 12 months  ?  Recent Outpatient Visits   ?      ? 1 month ago Peripheral edema  ? [place] P, DO  ? 2 months ago Peripheral edema  ? [place], [place] P, DO  ? 2 months ago Cellulitis of right leg  ? [place], Megan P, DO  ? 3 months ago Post-COVID chronic fatigue  ? [place], Megan P, DO  ? 4 months ago Post-COVID chronic fatigue  ? [place], [place] P, DO  ?  ?  ?Future Appointments   ?        ? In 4 weeks Wynetta Emery, Barb Merino, DO [place]  ? In 6 months  [place]  ?  ? ?  ?  ?  ? ?

## 2022-03-26 ENCOUNTER — Other Ambulatory Visit: Payer: Self-pay | Admitting: Internal Medicine

## 2022-03-26 NOTE — Telephone Encounter (Signed)
Component Ref Range & Units 2 wk ago ?(03/06/22) 4 mo ago ?(11/02/21) 4 mo ago ?(11/01/21) 4 mo ago ?(10/31/21) 4 mo ago ?(10/30/21) 5 mo ago ?(10/02/21) 6 mo ago ?(09/05/21)  ?Prothrombin Time 11.4 - 15.2 seconds 23.6 High   27.2 High   22.1 High   22.2 High   21.5 High   31.8 High   25.1 High    ?INR 0.8 - 1.2 2.1 High   2.5 High  CM  1.9 High  CM  1.9 High  CM  1.9 High  CM  3.1 High  CM  2.3 High  CM   ? ?

## 2022-04-03 ENCOUNTER — Inpatient Hospital Stay: Payer: Medicare Other | Attending: Internal Medicine

## 2022-04-03 DIAGNOSIS — I82512 Chronic embolism and thrombosis of left femoral vein: Secondary | ICD-10-CM | POA: Diagnosis present

## 2022-04-03 DIAGNOSIS — C44310 Basal cell carcinoma of skin of unspecified parts of face: Secondary | ICD-10-CM | POA: Diagnosis present

## 2022-04-03 LAB — PROTIME-INR
INR: 2.8 — ABNORMAL HIGH (ref 0.8–1.2)
Prothrombin Time: 29.1 seconds — ABNORMAL HIGH (ref 11.4–15.2)

## 2022-04-19 ENCOUNTER — Ambulatory Visit: Payer: Medicare Other | Admitting: Family Medicine

## 2022-04-19 ENCOUNTER — Encounter: Payer: Self-pay | Admitting: Family Medicine

## 2022-04-19 VITALS — BP 116/70 | HR 74 | Temp 97.6°F | Wt 164.4 lb

## 2022-04-19 DIAGNOSIS — R052 Subacute cough: Secondary | ICD-10-CM | POA: Diagnosis not present

## 2022-04-19 DIAGNOSIS — R5383 Other fatigue: Secondary | ICD-10-CM | POA: Diagnosis not present

## 2022-04-19 DIAGNOSIS — I129 Hypertensive chronic kidney disease with stage 1 through stage 4 chronic kidney disease, or unspecified chronic kidney disease: Secondary | ICD-10-CM

## 2022-04-19 DIAGNOSIS — N184 Chronic kidney disease, stage 4 (severe): Secondary | ICD-10-CM

## 2022-04-19 DIAGNOSIS — D692 Other nonthrombocytopenic purpura: Secondary | ICD-10-CM

## 2022-04-19 DIAGNOSIS — H6123 Impacted cerumen, bilateral: Secondary | ICD-10-CM

## 2022-04-19 DIAGNOSIS — Z13220 Encounter for screening for lipoid disorders: Secondary | ICD-10-CM

## 2022-04-19 DIAGNOSIS — R413 Other amnesia: Secondary | ICD-10-CM

## 2022-04-19 LAB — URINALYSIS, ROUTINE W REFLEX MICROSCOPIC
Bilirubin, UA: NEGATIVE
Glucose, UA: NEGATIVE
Ketones, UA: NEGATIVE
Leukocytes,UA: NEGATIVE
Nitrite, UA: NEGATIVE
Specific Gravity, UA: >1.03 — ABNORMAL HIGH (ref 1.005–1.030)
Urobilinogen, Ur: 0.2 mg/dL (ref 0.2–1.0)
pH, UA: 5.5 (ref 5.0–7.5)

## 2022-04-19 LAB — MICROSCOPIC EXAMINATION
Bacteria, UA: NONE SEEN
Epithelial Cells (non renal): NONE SEEN /hpf (ref 0–10)
WBC, UA: NONE SEEN /hpf (ref 0–5)

## 2022-04-19 MED ORDER — DONEPEZIL HCL 10 MG PO TABS
10.0000 mg | ORAL_TABLET | Freq: Every day | ORAL | 1 refills | Status: DC
Start: 2022-04-19 — End: 2022-10-11

## 2022-04-19 MED ORDER — MONTELUKAST SODIUM 10 MG PO TABS: 10.0000 mg | ORAL_TABLET | Freq: Every day | ORAL | 3 refills | Status: DC

## 2022-04-19 MED ORDER — CETIRIZINE HCL 10 MG PO TABS: 10.0000 mg | ORAL_TABLET | Freq: Every day | ORAL | 3 refills | Status: DC

## 2022-04-19 NOTE — Progress Notes (Signed)
? ?BP 116/70   Pulse 74   Temp 97.6 ?F (36.4 ?C)   Wt 164 lb 6.4 oz (74.6 kg)   SpO2 97%   BMI 25.75 kg/m?   ? ?Subjective:  ? ? Patient ID: Gregg Thomas, male    DOB: 09-25-1931, 88 y.o.   MRN: [id number] ? ?HPI: ?Gregg Thomas is a 88 y.o. male ? ?Chief Complaint  ?Patient presents with  ? Chronic Kidney Disease  ? Hypertension  ? Cough  ?  Patient states he is still coughing, has been ongoing since patient had PNA. Patient wife states it sounds crackling.  ? Cerumen Impaction  ?  Patient wife states she cleans his hearing aids and states they have wax on them.   ? ?HYPERTENSION ?Hypertension status: controlled  ?Satisfied with current treatment? yes ?Duration of hypertension: chronic ?BP monitoring frequency:  not checking ?BP range:  ?BP medication side effects:  no ?Medication compliance: excellent compliance ?Previous BP meds:enlalapril ?Aspirin: no ?Recurrent headaches: no ?Visual changes: no ?Palpitations: no ?Dyspnea: no ?Chest pain: no ?Lower extremity edema: no ?Dizzy/lightheaded: no ? ?Still having issues with memory. Tolerating the aricpet. Would like to go up on it.  ? ?COUGH ?Duration: about 2 months ?Circumstances of initial development of cough:  pneumonia ?Cough severity: mild ?Cough description: croupy, nagging cough ?Aggravating factors:  nothing ?Alleviating factors: nothing ?Status:  stable ?Treatments attempted: cough syrup ?Wheezing: no ?Shortness of breath: no ?Chest pain: no ?Chest tightness:no ?Nasal congestion: yes ?Runny nose: yes ?Postnasal drip: no ?Frequent throat clearing or swallowing: yes ?Hemoptysis: no ?Fevers: no ?Night sweats: no ?Weight loss: no ?Heartburn: no ?Recent foreign travel: no ?Tuberculosis contacts: no ? ?EAG CLOGGED ?Duration: weeks ?Involved ear(s):  bilateral ?Sensation of feeling clogged/plugged: yes ?Decreased/muffled hearing:yes ?Ear pain: no ?Fever: no ?Otorrhea: no ?Hearing loss: yes ?Upper respiratory infection symptoms: no ?Using  Q-Tips: no ?Status: worse ?History of cerumenosis: yes ?Treatments attempted: none ? ? ?Relevant past medical, surgical, family and social history reviewed and updated as indicated. Interim medical history since our last visit reviewed. ?Allergies and medications reviewed and updated. ? ?Review of Systems  ?Constitutional: Negative.   ?Respiratory: Negative.    ?Cardiovascular: Negative.   ?Gastrointestinal: Negative.   ?Musculoskeletal: Negative.   ?Neurological: Negative.   ?Psychiatric/Behavioral: Negative.    ? ?Per HPI unless specifically indicated above ? ?   ?Objective:  ?  ?BP 116/70   Pulse 74   Temp 97.6 ?F (36.4 ?C)   Wt 164 lb 6.4 oz (74.6 kg)   SpO2 97%   BMI 25.75 kg/m?   ?Wt Readings from Last 3 Encounters:  ?04/19/22 164 lb 6.4 oz (74.6 kg)  ?03/06/22 164 lb 6.4 oz (74.6 kg)  ?02/08/22 165 lb 3.2 oz (74.9 kg)  ?  ?Physical Exam ?Vitals and nursing note reviewed.  ?Constitutional:   ?   General: He is not in acute distress. ?   Appearance: Normal appearance. He is not ill-appearing, toxic-appearing or diaphoretic.  ?HENT:  ?   Head: Normocephalic and atraumatic.  ?   Right Ear: External ear normal. There is impacted cerumen.  ?   Left Ear: External ear normal. There is impacted cerumen.  ?   Nose: Nose normal.  ?   Mouth/Throat:  ?   Mouth: Mucous membranes are moist.  ?   Pharynx: Oropharynx is clear.  ?Eyes:  ?   General: No scleral icterus.    ?   Right eye: No discharge.     ?  Left eye: No discharge.  ?   Extraocular Movements: Extraocular movements intact.  ?   Conjunctiva/sclera: Conjunctivae normal.  ?   Pupils: Pupils are equal, round, and reactive to light.  ?Cardiovascular:  ?   Rate and Rhythm: Normal rate and regular rhythm.  ?   Pulses: Normal pulses.  ?   Heart sounds: Normal heart sounds. No murmur heard. ?  No friction rub. No gallop.  ?Pulmonary:  ?   Effort: Pulmonary effort is normal. No respiratory distress.  ?   Breath sounds: Normal breath sounds. No stridor. No wheezing,  rhonchi or rales.  ?Chest:  ?   Chest wall: No tenderness.  ?Musculoskeletal:     ?   General: Normal range of motion.  ?   Cervical back: Normal range of motion and neck supple.  ?Skin: ?   General: Skin is warm and dry.  ?   Capillary Refill: Capillary refill takes less than 2 seconds.  ?   Coloration: Skin is not jaundiced or pale.  ?   Findings: No bruising, erythema, lesion or rash.  ?Neurological:  ?   General: No focal deficit present.  ?   Mental Status: He is alert and oriented to person, place, and time. Mental status is at baseline.  ?Psychiatric:     ?   Mood and Affect: Mood normal.     ?   Behavior: Behavior normal.     ?   Thought Content: Thought content normal.     ?   Judgment: Judgment normal.  ? ? ?Results for orders placed or performed in visit on 04/19/22  ?Microscopic Examination  ? Urine  ?Result Value Ref Range  ? WBC, UA None seen 0 - 5 /hpf  ? RBC 0-2 0 - 2 /hpf  ? Epithelial Cells (non renal) None seen 0 - 10 /hpf  ? Mucus, UA Present (A) Not Estab.  ? Bacteria, UA None seen None seen/Few  ?Comprehensive metabolic panel  ?Result Value Ref Range  ? Glucose 95 70 - 99 mg/dL  ? BUN 21 8 - 27 mg/dL  ? Creatinine, Ser 1.89 (H) 0.76 - 1.27 mg/dL  ? eGFR 34 (L) >59 mL/min/1.73  ? BUN/Creatinine Ratio 11 10 - 24  ? Sodium 144 134 - 144 mmol/L  ? Potassium 4.8 3.5 - 5.2 mmol/L  ? Chloride 108 (H) 96 - 106 mmol/L  ? CO2 24 20 - 29 mmol/L  ? Calcium 10.9 (H) 8.6 - 10.2 mg/dL  ? Total Protein 6.8 6.0 - 8.5 g/dL  ? Albumin 4.1 3.6 - 4.6 g/dL  ? Globulin, Total 2.7 1.5 - 4.5 g/dL  ? Albumin/Globulin Ratio 1.5 1.2 - 2.2  ? Bilirubin Total 0.3 0.0 - 1.2 mg/dL  ? Alkaline Phosphatase 109 44 - 121 IU/L  ? AST 28 0 - 40 IU/L  ? ALT 23 0 - 44 IU/L  ?CBC with Differential/Platelet  ?Result Value Ref Range  ? WBC 4.7 3.4 - 10.8 x10E3/uL  ? RBC 4.62 4.14 - 5.80 x10E6/uL  ? Hemoglobin 14.7 13.0 - 17.7 g/dL  ? Hematocrit 43.5 37.5 - 51.0 %  ? MCV 94 79 - 97 fL  ? MCH 31.8 26.6 - 33.0 pg  ? MCHC 33.8 31.5 - 35.7  g/dL  ? RDW 13.4 11.6 - 15.4 %  ? Platelets 194 150 - 450 x10E3/uL  ? Neutrophils 53 Not Estab. %  ? Lymphs 27 Not Estab. %  ? Monocytes 14 Not Estab. %  ?  Eos 5 Not Estab. %  ? Basos 1 Not Estab. %  ? Neutrophils Absolute 2.5 1.4 - 7.0 x10E3/uL  ? Lymphocytes Absolute 1.3 0.7 - 3.1 x10E3/uL  ? Monocytes Absolute 0.6 0.1 - 0.9 x10E3/uL  ? EOS (ABSOLUTE) 0.2 0.0 - 0.4 x10E3/uL  ? Basophils Absolute 0.0 0.0 - 0.2 x10E3/uL  ? Immature Granulocytes 0 Not Estab. %  ? Immature Grans (Abs) 0.0 0.0 - 0.1 x10E3/uL  ?Lipid Panel w/o Chol/HDL Ratio  ?Result Value Ref Range  ? Cholesterol, Total 169 100 - 199 mg/dL  ? Triglycerides 125 0 - 149 mg/dL  ? HDL 51 >39 mg/dL  ? VLDL Cholesterol Cal 22 5 - 40 mg/dL  ? LDL Chol Calc (NIH) 96 0 - 99 mg/dL  ?TSH  ?Result Value Ref Range  ? TSH 1.400 0.450 - 4.500 uIU/mL  ?VITAMIN D 25 Hydroxy (Vit-D Deficiency, Fractures)  ?Result Value Ref Range  ? Vit D, 25-Hydroxy 46.3 30.0 - 100.0 ng/mL  ?Urinalysis, Routine w reflex microscopic  ?Result Value Ref Range  ? Specific Gravity, UA >1.030 (H) 1.005 - 1.030  ? pH, UA 5.5 5.0 - 7.5  ? Color, UA Yellow Yellow  ? Appearance Ur Clear Clear  ? Leukocytes,UA Negative Negative  ? Protein,UA 2+ (A) Negative/Trace  ? Glucose, UA Negative Negative  ? Ketones, UA Negative Negative  ? RBC, UA Trace (A) Negative  ? Bilirubin, UA Negative Negative  ? Urobilinogen, Ur 0.2 0.2 - 1.0 mg/dL  ? Nitrite, UA Negative Negative  ? Microscopic Examination See below:   ? ?   ?Assessment & Plan:  ? ?Problem List Items Addressed This Visit   ? ?  ? Cardiovascular and Mediastinum  ? Senile purpura ([place])  ?  Reassured patient. Continue to monitor. Call with any concerns.  ? ?  ?  ? Relevant Orders  ? Comprehensive metabolic panel (Completed)  ? CBC with Differential/Platelet (Completed)  ?  ? Genitourinary  ? CKD (chronic kidney disease) stage 4, GFR 15-29 ml/min (HCC)  ?  Labs drawn today. Await results. Continue to follow with nephrology. ? ?  ?  ? Benign  hypertensive renal disease  ?  Under good control on current regimen. Continue current regimen. Continue to monitor. Call with any concerns. Refills through nephrology. Labs drawn today. ? ? ?  ?  ? Relevant Orders  ? Comp

## 2022-04-20 ENCOUNTER — Encounter: Payer: Self-pay | Admitting: Family Medicine

## 2022-04-20 LAB — COMPREHENSIVE METABOLIC PANEL
ALT: 23 IU/L (ref 0–44)
AST: 28 IU/L (ref 0–40)
Albumin/Globulin Ratio: 1.5 (ref 1.2–2.2)
Albumin: 4.1 g/dL (ref 3.6–4.6)
Alkaline Phosphatase: 109 IU/L (ref 44–121)
BUN/Creatinine Ratio: 11 (ref 10–24)
BUN: 21 mg/dL (ref 8–27)
Bilirubin Total: 0.3 mg/dL (ref 0.0–1.2)
CO2: 24 mmol/L (ref 20–29)
Calcium: 10.9 mg/dL — ABNORMAL HIGH (ref 8.6–10.2)
Chloride: 108 mmol/L — ABNORMAL HIGH (ref 96–106)
Creatinine, Ser: 1.89 mg/dL — ABNORMAL HIGH (ref 0.76–1.27)
Globulin, Total: 2.7 g/dL (ref 1.5–4.5)
Glucose: 95 mg/dL (ref 70–99)
Potassium: 4.8 mmol/L (ref 3.5–5.2)
Sodium: 144 mmol/L (ref 134–144)
Total Protein: 6.8 g/dL (ref 6.0–8.5)
eGFR: 34 mL/min/{1.73_m2} — ABNORMAL LOW (ref >59)

## 2022-04-20 LAB — CBC WITH DIFFERENTIAL/PLATELET
Basophils Absolute: 0 10*3/uL (ref 0.0–0.2)
Basos: 1 %
EOS (ABSOLUTE): 0.2 10*3/uL (ref 0.0–0.4)
Eos: 5 %
Hematocrit: 43.5 % (ref 37.5–51.0)
Hemoglobin: 14.7 g/dL (ref 13.0–17.7)
Immature Grans (Abs): 0 10*3/uL (ref 0.0–0.1)
Immature Granulocytes: 0 %
Lymphocytes Absolute: 1.3 10*3/uL (ref 0.7–3.1)
Lymphs: 27 %
MCH: 31.8 pg (ref 26.6–33.0)
MCHC: 33.8 g/dL (ref 31.5–35.7)
MCV: 94 fL (ref 79–97)
Monocytes Absolute: 0.6 10*3/uL (ref 0.1–0.9)
Monocytes: 14 %
Neutrophils Absolute: 2.5 10*3/uL (ref 1.4–7.0)
Neutrophils: 53 %
Platelets: 194 10*3/uL (ref 150–450)
RBC: 4.62 x10E6/uL (ref 4.14–5.80)
RDW: 13.4 % (ref 11.6–15.4)
WBC: 4.7 10*3/uL (ref 3.4–10.8)

## 2022-04-20 LAB — LIPID PANEL W/O CHOL/HDL RATIO
Cholesterol, Total: 169 mg/dL (ref 100–199)
HDL: 51 mg/dL (ref >39)
LDL Chol Calc (NIH): 96 mg/dL (ref 0–99)
Triglycerides: 125 mg/dL (ref 0–149)
VLDL Cholesterol Cal: 22 mg/dL (ref 5–40)

## 2022-04-20 LAB — VITAMIN D 25 HYDROXY (VIT D DEFICIENCY, FRACTURES): Vit D, 25-Hydroxy: 46.3 ng/mL (ref 30.0–100.0)

## 2022-04-20 LAB — TSH: TSH: 1.4 u[IU]/mL (ref 0.450–4.500)

## 2022-04-20 NOTE — Assessment & Plan Note (Signed)
Will increase his aricept to '10mg'$  as he's tolerating it. Continue to monitor.  ?

## 2022-04-20 NOTE — Assessment & Plan Note (Signed)
Reassured patient. Continue to monitor. Call with any concerns.  

## 2022-04-20 NOTE — Assessment & Plan Note (Signed)
Labs drawn today. Await results. Continue to follow with nephrology. ?

## 2022-04-20 NOTE — Assessment & Plan Note (Signed)
Under good control on current regimen. Continue current regimen. Continue to monitor. Call with any concerns. Refills through nephrology. Labs drawn today.  

## 2022-04-27 ENCOUNTER — Ambulatory Visit: Payer: Medicare Other | Admitting: Family Medicine

## 2022-04-30 ENCOUNTER — Other Ambulatory Visit: Payer: Self-pay

## 2022-04-30 DIAGNOSIS — I82512 Chronic embolism and thrombosis of left femoral vein: Secondary | ICD-10-CM

## 2022-05-01 ENCOUNTER — Inpatient Hospital Stay: Payer: Medicare Other | Attending: Internal Medicine

## 2022-05-01 DIAGNOSIS — I82512 Chronic embolism and thrombosis of left femoral vein: Secondary | ICD-10-CM | POA: Insufficient documentation

## 2022-05-01 DIAGNOSIS — Z79899 Other long term (current) drug therapy: Secondary | ICD-10-CM | POA: Insufficient documentation

## 2022-05-01 DIAGNOSIS — N184 Chronic kidney disease, stage 4 (severe): Secondary | ICD-10-CM | POA: Diagnosis not present

## 2022-05-01 DIAGNOSIS — C44310 Basal cell carcinoma of skin of unspecified parts of face: Secondary | ICD-10-CM | POA: Diagnosis present

## 2022-05-01 LAB — PROTIME-INR
INR: 2 — ABNORMAL HIGH (ref 0.8–1.2)
Prothrombin Time: 22.7 seconds — ABNORMAL HIGH (ref 11.4–15.2)

## 2022-05-15 ENCOUNTER — Ambulatory Visit: Payer: Medicare Other | Admitting: Family Medicine

## 2022-05-15 ENCOUNTER — Other Ambulatory Visit: Payer: Self-pay | Admitting: Family Medicine

## 2022-05-15 ENCOUNTER — Encounter: Payer: Self-pay | Admitting: Family Medicine

## 2022-05-15 VITALS — BP 115/70 | HR 60 | Temp 97.5°F | Wt 164.2 lb

## 2022-05-15 DIAGNOSIS — E538 Deficiency of other specified B group vitamins: Secondary | ICD-10-CM | POA: Diagnosis not present

## 2022-05-15 DIAGNOSIS — R29898 Other symptoms and signs involving the musculoskeletal system: Secondary | ICD-10-CM | POA: Diagnosis not present

## 2022-05-15 DIAGNOSIS — G9332 Myalgic encephalomyelitis/chronic fatigue syndrome: Secondary | ICD-10-CM | POA: Diagnosis not present

## 2022-05-15 DIAGNOSIS — U099 Post covid-19 condition, unspecified: Secondary | ICD-10-CM

## 2022-05-15 DIAGNOSIS — R2689 Other abnormalities of gait and mobility: Secondary | ICD-10-CM

## 2022-05-15 MED ORDER — CYANOCOBALAMIN 1000 MCG/ML IJ SOLN
1000.0000 ug | Freq: Once | INTRAMUSCULAR | Status: AC
  Administered 2022-05-15: 1000 ug via INTRAMUSCULAR

## 2022-05-15 NOTE — Assessment & Plan Note (Signed)
Has been taking oral B12. Will change to IM. Call to check in on energy in 2 weeks. Call with any concerns.  ?

## 2022-05-15 NOTE — Progress Notes (Signed)
? ?BP 115/70   Pulse 60   Temp (!) 97.5 ?F (36.4 ?C)   Wt 164 lb 3.2 oz (74.5 kg)   SpO2 96%   BMI 25.72 kg/m?   ? ?Subjective:  ? ? Patient ID: Gregg Thomas, male    DOB: 07-Nov-1931, 88 y.o.   MRN: [id number] ? ?HPI: ?Gregg Thomas is a 88 y.o. male ? ?Chief Complaint  ?Patient presents with  ? Referral  ?  Patient states he has trouble with balance and walking. Patient states home health nurse recommended he be referred to PT.   ? Fatigue  ?  Patient states he feels tired, and has no energy since he had COVID last year in November. Home health nurse suggested to be evaluated for possible depression, patient states he does not feel depressed just tired.   ? ?FATIGUE ?Duration:  chronic ?Severity: moderate  ?Onset: gradual ?Context when symptoms started:  COVID ?Symptoms improve with rest: no  ?Depressive symptoms: yes ?Stress/anxiety: yes ?Insomnia: no  ?Snoring: no ?Observed apnea by bed partner: no ?Daytime hypersomnolence:yes ?Wakes feeling refreshed: yes ?History of sleep study: no ?Dysnea on exertion:  no ?Orthopnea/PND: no ?Chest pain: no ?Chronic cough: no ?Lower extremity edema: no ?Arthralgias:no ?Myalgias: no ?Weakness: no ?Rash: no ? ?Has been having problems with his balance. Would like to start PT.  ? ?Relevant past medical, surgical, family and social history reviewed and updated as indicated. Interim medical history since our last visit reviewed. ?Allergies and medications reviewed and updated. ? ?Review of Systems  ?Constitutional:  Positive for fatigue. Negative for activity change, appetite change, chills, diaphoresis, fever and unexpected weight change.  ?Respiratory: Negative.    ?Cardiovascular: Negative.   ?Musculoskeletal: Negative.   ?Skin: Negative.   ?Psychiatric/Behavioral: Negative.    ? ?Per HPI unless specifically indicated above ? ?   ?Objective:  ?  ?BP 115/70   Pulse 60   Temp (!) 97.5 ?F (36.4 ?C)   Wt 164 lb 3.2 oz (74.5 kg)   SpO2 96%   BMI 25.72  kg/m?   ?Wt Readings from Last 3 Encounters:  ?05/15/22 164 lb 3.2 oz (74.5 kg)  ?04/19/22 164 lb 6.4 oz (74.6 kg)  ?03/06/22 164 lb 6.4 oz (74.6 kg)  ?  ?Physical Exam ?Vitals and nursing note reviewed.  ?Constitutional:   ?   General: He is not in acute distress. ?   Appearance: Normal appearance. He is normal weight. He is not ill-appearing, toxic-appearing or diaphoretic.  ?HENT:  ?   Head: Normocephalic and atraumatic.  ?   Right Ear: External ear normal.  ?   Left Ear: External ear normal.  ?   Nose: Nose normal.  ?   Mouth/Throat:  ?   Mouth: Mucous membranes are moist.  ?   Pharynx: Oropharynx is clear.  ?Eyes:  ?   General: No scleral icterus.    ?   Right eye: No discharge.     ?   Left eye: No discharge.  ?   Extraocular Movements: Extraocular movements intact.  ?   Conjunctiva/sclera: Conjunctivae normal.  ?   Pupils: Pupils are equal, round, and reactive to light.  ?Cardiovascular:  ?   Rate and Rhythm: Normal rate and regular rhythm.  ?   Pulses: Normal pulses.  ?   Heart sounds: Normal heart sounds. No murmur heard. ?  No friction rub. No gallop.  ?Pulmonary:  ?   Effort: Pulmonary effort is normal. No respiratory distress.  ?  Breath sounds: Normal breath sounds. No stridor. No wheezing, rhonchi or rales.  ?Chest:  ?   Chest wall: No tenderness.  ?Musculoskeletal:     ?   General: Normal range of motion.  ?   Cervical back: Normal range of motion and neck supple.  ?Skin: ?   General: Skin is warm and dry.  ?   Capillary Refill: Capillary refill takes less than 2 seconds.  ?   Coloration: Skin is not jaundiced or pale.  ?   Findings: No bruising, erythema, lesion or rash.  ?Neurological:  ?   General: No focal deficit present.  ?   Mental Status: He is alert and oriented to person, place, and time. Mental status is at baseline.  ?Psychiatric:     ?   Mood and Affect: Mood normal.     ?   Behavior: Behavior normal.     ?   Thought Content: Thought content normal.     ?   Judgment: Judgment normal.   ? ? ?Results for orders placed or performed in visit on 05/01/22  ?Protime-INR  ?Result Value Ref Range  ? Prothrombin Time 22.7 (H) 11.4 - 15.2 seconds  ? INR 2.0 (H) 0.8 - 1.2  ? ?   ?Assessment & Plan:  ? ?Problem List Items Addressed This Visit   ? ?  ? Other  ? Post-COVID chronic fatigue - Primary  ?  Will start B12. If not better in 2 weeks, will cut down on his enalapril with goal of BP in the 120s-130s. If still not better, consider SSRI. Recheck 1 month.  ? ?  ?  ? B12 deficiency  ?  Has been taking oral B12. Will change to IM. Call to check in on energy in 2 weeks. Call with any concerns.  ? ?  ?  ? ?Other Visit Diagnoses   ? ? Balance problem      ? Will get him set up for PT. Call with any concerns.   ? Relevant Orders  ? Ambulatory referral to Physical Therapy  ? Muscular deconditioning      ? Will get him set up for PT. Call with any concerns.   ? Relevant Orders  ? Ambulatory referral to Physical Therapy  ? ?  ?  ? ?Follow up plan: ?Return in about 4 weeks (around 06/12/2022). ? ? ? ? ? ?

## 2022-05-15 NOTE — Assessment & Plan Note (Signed)
Will start B12. If not better in 2 weeks, will cut down on his enalapril with goal of BP in the 120s-130s. If still not better, consider SSRI. Recheck 1 month.  ?

## 2022-05-16 NOTE — Telephone Encounter (Signed)
Requested Prescriptions  ?Pending Prescriptions Disp Refills  ?? cetirizine (ZYRTEC) 5 MG tablet [Pharmacy Med Name: CETIRIZINE HCL 5 MG TABLET] 30 tablet 2  ?  Sig: Take 1 tablet (5 mg total) by mouth daily.  ?  ? Ear, Nose, and Throat:  Antihistamines 2 Failed - 05/15/2022  1:24 PM  ?  ?  Failed - Cr in normal range and within 360 days  ?  Creatinine  ?Date Value Ref Range Status  ?05/03/2014 1.71 (H) 0.60 - 1.30 mg/dL Final  ? ?Creatinine, Ser  ?Date Value Ref Range Status  ?04/19/2022 1.89 (H) 0.76 - 1.27 mg/dL Final  ?   ?  ?  Passed - Valid encounter within last 12 months  ?  Recent Outpatient Visits   ?      ? 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 [place], Megan P, DO  ? 3 weeks ago Other fatigue  ? [place] P, DO  ? 3 months ago Peripheral edema  ? [place] P, DO  ? 4 months ago Peripheral edema  ? [place] P, DO  ? 4 months ago Cellulitis of right leg  ? [place], [place] P, DO  ?  ?  ?Future Appointments   ?        ? In 2 months Wynetta Emery, Barb Merino, DO [place]  ? In 4 months  [place], [facility]  ?  ? ?  ?  ?  ? ? ?

## 2022-05-22 ENCOUNTER — Telehealth: Payer: Self-pay | Admitting: Family Medicine

## 2022-05-22 ENCOUNTER — Other Ambulatory Visit: Payer: Self-pay | Admitting: Family Medicine

## 2022-05-22 DIAGNOSIS — E538 Deficiency of other specified B group vitamins: Secondary | ICD-10-CM

## 2022-05-22 MED ORDER — CYANOCOBALAMIN 1000 MCG/ML IJ SOLN
1000.0000 ug | INTRAMUSCULAR | Status: AC
  Administered 2022-06-05 – 2023-05-29 (×11): 1000 ug via INTRAMUSCULAR

## 2022-05-22 NOTE — Telephone Encounter (Signed)
-----   Message from Valerie Roys, DO sent at 05/15/2022  3:50 PM EDT ----- Call to check in on energy on B12 if not better, decrease enalapril

## 2022-05-22 NOTE — Telephone Encounter (Signed)
Can we please see how his energy is on the B12? If it's good, I'll set him up for monthly shots. If it's not, I'd like him to drop his enalapril in 1/2 ('10mg'$  2x a day) and I'll see him in 2 weeks to check on his BP

## 2022-05-22 NOTE — Telephone Encounter (Signed)
Yes. I've put in orders for monthly b12. OK for next shot in 2 weeks.

## 2022-05-22 NOTE — Telephone Encounter (Signed)
Spoke with pt's wife, pt wife says his energy is doing a little better but not all the way there, she is asking if he can start doing B12 shots anyways? Wife has been made aware of enalapril change.

## 2022-05-22 NOTE — Progress Notes (Signed)
cyanocobalamine

## 2022-05-22 NOTE — Telephone Encounter (Signed)
Pt has been scheduled for 06/05/22 at 3:20 for B12 shots.

## 2022-05-31 ENCOUNTER — Other Ambulatory Visit: Payer: Self-pay | Admitting: Family Medicine

## 2022-06-01 NOTE — Telephone Encounter (Signed)
Requested medication (s) are due for refill today: historical med  Requested medication (s) are on the active medication list: yes  Last refill:  12/04/21  Future visit scheduled: yes in 1 week  Notes to clinic:  historical medication . Do you want to order Rx?     Requested Prescriptions  Pending Prescriptions Disp Refills   enalapril (VASOTEC) 20 MG tablet [Pharmacy Med Name: ENALAPRIL MALEATE 20 MG TAB] 180 tablet 0    Sig: Take 1 tablet (20 mg total) by mouth 2 (two) times daily.     Cardiovascular:  ACE Inhibitors Failed - 05/31/2022 12:17 PM      Failed - Cr in normal range and within 180 days    Creatinine  Date Value Ref Range Status  05/03/2014 1.71 (H) 0.60 - 1.30 mg/dL Final   Creatinine, Ser  Date Value Ref Range Status  04/19/2022 1.89 (H) 0.76 - 1.27 mg/dL Final         Passed - K in normal range and within 180 days    Potassium  Date Value Ref Range Status  04/19/2022 4.8 3.5 - 5.2 mmol/L Final  05/03/2014 4.2 3.5 - 5.1 mmol/L Final         Passed - Patient is not pregnant      Passed - Last BP in normal range    BP Readings from Last 1 Encounters:  05/15/22 115/70         Passed - Valid encounter within last 6 months    Recent Outpatient Visits           2 weeks ago Post-COVID chronic fatigue   [place], [place], DO   1 month ago Other fatigue   [employer], [place], DO   3 months ago Peripheral edema   [place], [place], DO   4 months ago Peripheral edema   [place], Megan P, DO   4 months ago Cellulitis of right leg   [place], [place], DO       Future Appointments             In 1 week Wynetta Emery, Barb Merino, DO [facility], [place]   In 1 month [place], Barb Merino, DO [facility], [place]   In 3 months  [facility], [place]

## 2022-06-05 ENCOUNTER — Ambulatory Visit (INDEPENDENT_AMBULATORY_CARE_PROVIDER_SITE_OTHER): Payer: Medicare Other

## 2022-06-05 ENCOUNTER — Inpatient Hospital Stay: Payer: Medicare Other | Attending: Internal Medicine

## 2022-06-05 DIAGNOSIS — I82512 Chronic embolism and thrombosis of left femoral vein: Secondary | ICD-10-CM | POA: Diagnosis present

## 2022-06-05 DIAGNOSIS — E538 Deficiency of other specified B group vitamins: Secondary | ICD-10-CM

## 2022-06-05 DIAGNOSIS — C44310 Basal cell carcinoma of skin of unspecified parts of face: Secondary | ICD-10-CM | POA: Insufficient documentation

## 2022-06-05 LAB — PROTIME-INR
INR: 1.7 — ABNORMAL HIGH (ref 0.8–1.2)
Prothrombin Time: 19.6 seconds — ABNORMAL HIGH (ref 11.4–15.2)

## 2022-06-05 NOTE — Progress Notes (Signed)
B12 given today, tolerated well.

## 2022-06-07 ENCOUNTER — Telehealth: Payer: Self-pay

## 2022-06-07 NOTE — Telephone Encounter (Signed)
Message left for to call the office for lab results and I will try to call again later today.

## 2022-06-07 NOTE — Telephone Encounter (Signed)
-----   Message from Cammie Sickle, MD sent at 06/07/2022  8:34 AM EDT ----- Regarding: FW: Please call patient and inform him that his Coumadin levels of slightly lower than recommended. Find out his current dose of Coumadin.  Thanks, GB ----- Message ----- From: Buel Ream, [facility] [place] Sent: 06/05/2022   4:14 PM EDT To: Cammie Sickle, MD

## 2022-06-07 NOTE — Telephone Encounter (Signed)
Another message left for patient to call for lab results and to confirm his coumadin dose.

## 2022-06-08 NOTE — Telephone Encounter (Signed)
Confirmed with patient's wife that he is taking Warfarin 5 mg 1 tab QD.  PCP has decreased his bp medication and started B12 injections with first inj on 5/16 and 2nd inj on 6/6.

## 2022-06-08 NOTE — Telephone Encounter (Signed)
Patients wife aware

## 2022-06-12 ENCOUNTER — Ambulatory Visit (INDEPENDENT_AMBULATORY_CARE_PROVIDER_SITE_OTHER): Payer: Medicare Other | Admitting: Family Medicine

## 2022-06-12 ENCOUNTER — Encounter: Payer: Self-pay | Admitting: Family Medicine

## 2022-06-12 VITALS — BP 111/67 | HR 75 | Temp 97.9°F | Wt 164.8 lb

## 2022-06-12 DIAGNOSIS — R5383 Other fatigue: Secondary | ICD-10-CM

## 2022-06-12 DIAGNOSIS — I129 Hypertensive chronic kidney disease with stage 1 through stage 4 chronic kidney disease, or unspecified chronic kidney disease: Secondary | ICD-10-CM

## 2022-06-12 MED ORDER — ENALAPRIL MALEATE 10 MG PO TABS: 20.0000 mg | ORAL_TABLET | Freq: Every day | ORAL | 2 refills | Status: DC

## 2022-06-12 NOTE — Progress Notes (Signed)
BP 111/67   Pulse 75   Temp 97.9 F (36.6 C)   Wt 164 lb 12.8 oz (74.8 kg)   SpO2 97%   BMI 25.81 kg/m    Subjective:    Patient ID: Gregg Thomas, male    DOB: [birth year], 88 y.o.   MRN: [id number]  HPI: Shahram Alexopoulos is a 88 y.o. male  Chief Complaint  Patient presents with   Fatigue    Patient states he thinks his energy has been a little bit better in the last few days. patient wife states he is doing PT and thinks that is helping him.    FATIGUE Duration:  chronic Severity: moderate- better in the past couple of weeks  Onset: gradual Context when symptoms started:  unknown Symptoms improve with rest: no  Depressive symptoms: no Stress/anxiety: no Insomnia: no  Snoring: no Observed apnea by bed partner: no Daytime hypersomnolence:yes Wakes feeling refreshed: yes History of sleep study: no Dysnea on exertion:  no Orthopnea/PND: no Chest pain: no Chronic cough: no Lower extremity edema: no Arthralgias:no Myalgias: no Weakness: no Rash: no  Relevant past medical, surgical, family and social history reviewed and updated as indicated. Interim medical history since our last visit reviewed. Allergies and medications reviewed and updated.  Review of Systems  Constitutional:  Positive for fatigue. Negative for activity change, appetite change, chills, diaphoresis, fever and unexpected weight change.  Respiratory: Negative.    Cardiovascular: Negative.   Gastrointestinal: Negative.   Neurological: Negative.   Psychiatric/Behavioral: Negative.      Per HPI unless specifically indicated above     Objective:    BP 111/67   Pulse 75   Temp 97.9 F (36.6 C)   Wt 164 lb 12.8 oz (74.8 kg)   SpO2 97%   BMI 25.81 kg/m   Wt Readings from Last 3 Encounters:  06/12/22 164 lb 12.8 oz (74.8 kg)  05/15/22 164 lb 3.2 oz (74.5 kg)  04/19/22 164 lb 6.4 oz (74.6 kg)    Physical Exam Vitals and nursing note reviewed.  Constitutional:       General: He is not in acute distress.    Appearance: Normal appearance. He is normal weight. He is not ill-appearing, toxic-appearing or diaphoretic.  HENT:     Head: Normocephalic and atraumatic.     Right Ear: External ear normal.     Left Ear: External ear normal.     Nose: Nose normal.     Mouth/Throat:     Mouth: Mucous membranes are moist.     Pharynx: Oropharynx is clear.  Eyes:     General: No scleral icterus.       Right eye: No discharge.        Left eye: No discharge.     Extraocular Movements: Extraocular movements intact.     Conjunctiva/sclera: Conjunctivae normal.     Pupils: Pupils are equal, round, and reactive to light.  Cardiovascular:     Rate and Rhythm: Normal rate and regular rhythm.     Pulses: Normal pulses.     Heart sounds: Normal heart sounds. No murmur heard.    No friction rub. No gallop.  Pulmonary:     Effort: Pulmonary effort is normal. No respiratory distress.     Breath sounds: Normal breath sounds. No stridor. No wheezing, rhonchi or rales.  Chest:     Chest wall: No tenderness.  Musculoskeletal:        General: Normal range of motion.  Cervical back: Normal range of motion and neck supple.  Skin:    General: Skin is warm and dry.     Capillary Refill: Capillary refill takes less than 2 seconds.     Coloration: Skin is not jaundiced or pale.     Findings: No bruising, erythema, lesion or rash.  Neurological:     General: No focal deficit present.     Mental Status: He is alert and oriented to person, place, and time. Mental status is at baseline.  Psychiatric:        Mood and Affect: Mood normal.        Behavior: Behavior normal.        Thought Content: Thought content normal.        Judgment: Judgment normal.     Results for orders placed or performed in visit on 06/05/22  Protime-INR  Result Value Ref Range   Prothrombin Time 19.6 (H) 11.4 - 15.2 seconds   INR 1.7 (H) 0.8 - 1.2      Assessment & Plan:   Problem List Items  Addressed This Visit       Genitourinary   Benign hypertensive renal disease    BP still running low. Will cut his enalapril down to '10mg'$  daily with goal of keeping him in the 150-569V systolic and decreasing fatigue.       Other Visit Diagnoses     Other fatigue    -  Primary   Doing better on B12 shots and with PT. BP running low still will cut his enalapril lower and recheck about 3 weeks.         Follow up plan: Return in about 3 weeks (around 07/03/2022).

## 2022-06-12 NOTE — Assessment & Plan Note (Signed)
BP still running low. Will cut his enalapril down to '10mg'$  daily with goal of keeping him in the 867-619J systolic and decreasing fatigue.

## 2022-06-12 NOTE — Patient Instructions (Addendum)
Take '10mg'$  (1/2 of one of your '20mg'$ ) of your Enalapril daily

## 2022-07-05 ENCOUNTER — Telehealth: Payer: Self-pay | Admitting: Internal Medicine

## 2022-07-05 ENCOUNTER — Inpatient Hospital Stay: Payer: Medicare Other | Attending: Internal Medicine

## 2022-07-05 DIAGNOSIS — I82512 Chronic embolism and thrombosis of left femoral vein: Secondary | ICD-10-CM | POA: Insufficient documentation

## 2022-07-05 DIAGNOSIS — C44310 Basal cell carcinoma of skin of unspecified parts of face: Secondary | ICD-10-CM | POA: Insufficient documentation

## 2022-07-05 NOTE — Telephone Encounter (Signed)
Patient did not get labs today due to system being down. Left a voicemail for patient to call to reschedule.

## 2022-07-06 ENCOUNTER — Inpatient Hospital Stay: Payer: Medicare Other

## 2022-07-06 ENCOUNTER — Ambulatory Visit (INDEPENDENT_AMBULATORY_CARE_PROVIDER_SITE_OTHER): Payer: Medicare Other | Admitting: Family Medicine

## 2022-07-06 ENCOUNTER — Encounter: Payer: Self-pay | Admitting: Family Medicine

## 2022-07-06 VITALS — BP 134/73 | HR 59 | Temp 97.5°F | Wt 166.8 lb

## 2022-07-06 DIAGNOSIS — E538 Deficiency of other specified B group vitamins: Secondary | ICD-10-CM | POA: Diagnosis not present

## 2022-07-06 DIAGNOSIS — I82512 Chronic embolism and thrombosis of left femoral vein: Secondary | ICD-10-CM

## 2022-07-06 DIAGNOSIS — I129 Hypertensive chronic kidney disease with stage 1 through stage 4 chronic kidney disease, or unspecified chronic kidney disease: Secondary | ICD-10-CM

## 2022-07-06 DIAGNOSIS — R252 Cramp and spasm: Secondary | ICD-10-CM | POA: Diagnosis not present

## 2022-07-06 DIAGNOSIS — C44310 Basal cell carcinoma of skin of unspecified parts of face: Secondary | ICD-10-CM | POA: Diagnosis present

## 2022-07-06 DIAGNOSIS — R5383 Other fatigue: Secondary | ICD-10-CM

## 2022-07-06 LAB — PROTIME-INR
INR: 1.5 — ABNORMAL HIGH (ref 0.8–1.2)
Prothrombin Time: 18.3 seconds — ABNORMAL HIGH (ref 11.4–15.2)

## 2022-07-06 MED ORDER — ENALAPRIL MALEATE 10 MG PO TABS: 10.0000 mg | ORAL_TABLET | Freq: Every day | ORAL | 1 refills | Status: DC

## 2022-07-06 MED ORDER — GABAPENTIN 100 MG PO CAPS: 100.0000 mg | ORAL_CAPSULE | Freq: Every day | ORAL | 3 refills | Status: DC

## 2022-07-06 NOTE — Progress Notes (Signed)
BP 134/73   Pulse (!) 59   Temp (!) 97.5 F (36.4 C)   Wt 166 lb 12.8 oz (75.7 kg)   SpO2 97%   BMI 26.12 kg/m    Subjective:    Patient ID: Gregg Thomas, male    DOB: May 28, 1931, 88 y.o.   MRN: [id number]  HPI: Gregg Thomas is a 88 y.o. male  Chief Complaint  Patient presents with   Benign hypertensive renal disease    Patient states his BP has been running better at home    Fatigue    Patient states his energy has come up a little bit   Pain    Patient states both his feet and both legs have been cramping up mostly at night and they are painful.    NEUROPATHY Duration: couple of months Neuropathy status: stable  Satisfied with current treatment?: no Medication side effects: not on anything Medication compliance:   not on anything Location: bilateral lower legs Pain: yes Severity: moderate  Quality:  numb, tingling, aching Frequency: intermittent Bilateral: yes Symmetric: yes Numbness: yes Decreased sensation: yes Weakness: no Context: stable Alleviating factors:  Aggravating factors:  Treatments attempted:  Relevant past medical, surgical, family and social history reviewed and updated as indicated. Interim medical history since our last visit reviewed. Allergies and medications reviewed and updated.  Review of Systems  Constitutional: Negative.   Respiratory: Negative.    Cardiovascular: Negative.   Gastrointestinal: Negative.   Skin: Negative.   Neurological:  Positive for numbness. Negative for dizziness, tremors, seizures, syncope, facial asymmetry, speech difficulty, weakness, light-headedness and headaches.  Psychiatric/Behavioral: Negative.      Per HPI unless specifically indicated above     Objective:    BP 134/73   Pulse (!) 59   Temp (!) 97.5 F (36.4 C)   Wt 166 lb 12.8 oz (75.7 kg)   SpO2 97%   BMI 26.12 kg/m   Wt Readings from Last 3 Encounters:  07/06/22 166 lb 12.8 oz (75.7 kg)  06/12/22 164 lb 12.8 oz  (74.8 kg)  05/15/22 164 lb 3.2 oz (74.5 kg)    Physical Exam Vitals and nursing note reviewed.  Constitutional:      General: He is not in acute distress.    Appearance: Normal appearance. He is normal weight. He is not ill-appearing, toxic-appearing or diaphoretic.  HENT:     Head: Normocephalic and atraumatic.     Right Ear: External ear normal.     Left Ear: External ear normal.     Nose: Nose normal.     Mouth/Throat:     Mouth: Mucous membranes are moist.     Pharynx: Oropharynx is clear.  Eyes:     General: No scleral icterus.       Right eye: No discharge.        Left eye: No discharge.     Extraocular Movements: Extraocular movements intact.     Conjunctiva/sclera: Conjunctivae normal.     Pupils: Pupils are equal, round, and reactive to light.  Cardiovascular:     Rate and Rhythm: Normal rate and regular rhythm.     Pulses: Normal pulses.     Heart sounds: Normal heart sounds. No murmur heard.    No friction rub. No gallop.  Pulmonary:     Effort: Pulmonary effort is normal. No respiratory distress.     Breath sounds: Normal breath sounds. No stridor. No wheezing, rhonchi or rales.  Chest:     Chest  wall: No tenderness.  Musculoskeletal:        General: Normal range of motion.     Cervical back: Normal range of motion and neck supple.  Skin:    General: Skin is warm and dry.     Capillary Refill: Capillary refill takes less than 2 seconds.     Coloration: Skin is not jaundiced or pale.     Findings: No bruising, erythema, lesion or rash.  Neurological:     General: No focal deficit present.     Mental Status: He is alert and oriented to person, place, and time. Mental status is at baseline.  Psychiatric:        Mood and Affect: Mood normal.        Behavior: Behavior normal.        Thought Content: Thought content normal.        Judgment: Judgment normal.     Results for orders placed or performed in visit on 07/06/22  CBC with Differential/Platelet   Result Value Ref Range   WBC 6.2 3.4 - 10.8 x10E3/uL   RBC 4.29 4.14 - 5.80 x10E6/uL   Hemoglobin 14.0 13.0 - 17.7 g/dL   Hematocrit 41.3 37.5 - 51.0 %   MCV 96 79 - 97 fL   MCH 32.6 26.6 - 33.0 pg   MCHC 33.9 31.5 - 35.7 g/dL   RDW 14.9 11.6 - 15.4 %   Platelets 218 150 - 450 x10E3/uL   Neutrophils 64 Not Estab. %   Lymphs 21 Not Estab. %   Monocytes 10 Not Estab. %   Eos 4 Not Estab. %   Basos 1 Not Estab. %   Neutrophils Absolute 4.0 1.4 - 7.0 x10E3/uL   Lymphocytes Absolute 1.3 0.7 - 3.1 x10E3/uL   Monocytes Absolute 0.6 0.1 - 0.9 x10E3/uL   EOS (ABSOLUTE) 0.2 0.0 - 0.4 x10E3/uL   Basophils Absolute 0.0 0.0 - 0.2 x10E3/uL   Immature Granulocytes 0 Not Estab. %   Immature Grans (Abs) 0.0 0.0 - 0.1 [id number]  Basic metabolic panel  Result Value Ref Range   Glucose 96 70 - 99 mg/dL   BUN 17 8 - 27 mg/dL   Creatinine, Ser 1.68 (H) 0.76 - 1.27 mg/dL   eGFR 39 (L) >59 mL/min/1.73   BUN/Creatinine Ratio 10 10 - 24   Sodium 141 134 - 144 mmol/L   Potassium 5.2 3.5 - 5.2 mmol/L   Chloride 107 (H) 96 - 106 mmol/L   CO2 25 20 - 29 mmol/L   Calcium 10.7 (H) 8.6 - 10.2 mg/dL  Magnesium  Result Value Ref Range   Magnesium 2.0 1.6 - 2.3 mg/dL  TSH  Result Value Ref Range   TSH 1.620 0.450 - 4.500 uIU/mL      Assessment & Plan:   Problem List Items Addressed This Visit       Genitourinary   Benign hypertensive renal disease    Under good control on current regimen. Continue current regimen. Continue to monitor. Call with any concerns. Refills given. Labs drawn today.        Other Visit Diagnoses     Leg cramp    -  Primary   Will check labs today. Await results. Start gabapentin at bedtime. Continue to monitor. Call with any concerns.    Relevant Orders   CBC with Differential/Platelet (Completed)   Basic metabolic panel (Completed)   Magnesium (Completed)   TSH (Completed)   Other fatigue       Improved.  Continue B12 supplementation. Continue to monitor.          Follow up plan: Return in about 4 weeks (around 08/03/2022).

## 2022-07-07 LAB — CBC WITH DIFFERENTIAL/PLATELET
Basophils Absolute: 0 10*3/uL (ref 0.0–0.2)
Basos: 1 %
EOS (ABSOLUTE): 0.2 10*3/uL (ref 0.0–0.4)
Eos: 4 %
Hematocrit: 41.3 % (ref 37.5–51.0)
Hemoglobin: 14 g/dL (ref 13.0–17.7)
Immature Grans (Abs): 0 10*3/uL (ref 0.0–0.1)
Immature Granulocytes: 0 %
Lymphocytes Absolute: 1.3 10*3/uL (ref 0.7–3.1)
Lymphs: 21 %
MCH: 32.6 pg (ref 26.6–33.0)
MCHC: 33.9 g/dL (ref 31.5–35.7)
MCV: 96 fL (ref 79–97)
Monocytes Absolute: 0.6 10*3/uL (ref 0.1–0.9)
Monocytes: 10 %
Neutrophils Absolute: 4 10*3/uL (ref 1.4–7.0)
Neutrophils: 64 %
Platelets: 218 10*3/uL (ref 150–450)
RBC: 4.29 x10E6/uL (ref 4.14–5.80)
RDW: 14.9 % (ref 11.6–15.4)
WBC: 6.2 10*3/uL (ref 3.4–10.8)

## 2022-07-07 LAB — BASIC METABOLIC PANEL
BUN/Creatinine Ratio: 10 (ref 10–24)
BUN: 17 mg/dL (ref 8–27)
CO2: 25 mmol/L (ref 20–29)
Calcium: 10.7 mg/dL — ABNORMAL HIGH (ref 8.6–10.2)
Chloride: 107 mmol/L — ABNORMAL HIGH (ref 96–106)
Creatinine, Ser: 1.68 mg/dL — ABNORMAL HIGH (ref 0.76–1.27)
Glucose: 96 mg/dL (ref 70–99)
Potassium: 5.2 mmol/L (ref 3.5–5.2)
Sodium: 141 mmol/L (ref 134–144)
eGFR: 39 mL/min/{1.73_m2} — ABNORMAL LOW (ref >59)

## 2022-07-07 LAB — MAGNESIUM: Magnesium: 2 mg/dL (ref 1.6–2.3)

## 2022-07-07 LAB — TSH: TSH: 1.62 u[IU]/mL (ref 0.450–4.500)

## 2022-07-09 NOTE — Progress Notes (Signed)
Good morning, please let Troye know labs have returned and overall remain stable.  Kidney function remains at baseline kidney disease, continue to visit with Dr. Holley Raring.  Calcium mildly elevated, try to cut back on any calcium supplements or mild products and ensure plenty of water intake.  Remainder of labs are all stable.  Have a wonderful day!!

## 2022-07-13 ENCOUNTER — Other Ambulatory Visit: Payer: Self-pay | Admitting: Internal Medicine

## 2022-07-13 NOTE — Telephone Encounter (Signed)
Component Ref Range & Units 7 d ago (07/06/22) 1 mo ago (06/05/22) 2 mo ago (05/01/22) 3 mo ago (04/03/22) 4 mo ago (03/06/22) 8 mo ago (11/02/21) 8 mo ago (11/01/21)  Prothrombin Time 11.4 - 15.2 seconds 18.3 High   19.6 High   22.7 High   29.1 High   23.6 High   27.2 High   22.1 High    INR 0.8 - 1.2 1.5 High   1.7 High  CM  2.0 High  CM  2.8 High  CM  2.1 High  CM  2.5 High  CM  1.9 High  CM

## 2022-07-15 NOTE — Assessment & Plan Note (Signed)
Under good control on current regimen. Continue current regimen. Continue to monitor. Call with any concerns. Refills given. Labs drawn today.   

## 2022-07-16 ENCOUNTER — Other Ambulatory Visit: Payer: Self-pay | Admitting: Internal Medicine

## 2022-07-16 ENCOUNTER — Other Ambulatory Visit: Payer: Self-pay | Admitting: *Deleted

## 2022-07-16 ENCOUNTER — Telehealth: Payer: Self-pay

## 2022-07-16 MED ORDER — WARFARIN SODIUM 2 MG PO TABS: ORAL_TABLET | ORAL | 1 refills | Status: DC

## 2022-07-16 NOTE — Telephone Encounter (Signed)
Component Ref Range & Units 10 d ago (07/06/22) 1 mo ago (06/05/22) 2 mo ago (05/01/22) 3 mo ago (04/03/22) 4 mo ago (03/06/22) 8 mo ago (11/02/21) 8 mo ago (11/01/21)  Prothrombin Time 11.4 - 15.2 seconds 18.3 High   19.6 High   22.7 High   29.1 High   23.6 High   27.2 High   22.1 High    INR 0.8 - 1.2 1.5 High   1.7 High  CM  2.0 High  CM  2.8 High  CM  2.1 High  CM  2.5 High  CM  1.9 High  CM   Comment: (NOTE)  INR goal varies based on device and disease states.  Performed at [facility], [address] [place],  [place] [address]   Resulting Agency  [facility] CLIN [place] [facility] CLIN LAB [facility] CLIN LAB [place] [facility] LAB [place] [facility] LAB [place] [facility] LAB [facility] CLIN LAB         Specimen Collected: 07/06/22 15:13 Last Resulted: 07/06/22 16:43

## 2022-07-16 NOTE — Telephone Encounter (Signed)
Confirmed with patient's wife that he is taking Warfarin '5mg'$  QD.

## 2022-07-16 NOTE — Telephone Encounter (Signed)
-----   Message from Cammie Sickle, MD sent at 07/15/2022  9:42 PM EDT ----- Regarding: coumadin dose H/A- Please check with pt re: his dose of coumadin at home. Based on his levels looks like his coumadin dose needs to be re-adjusted. And his script for coumadin will need to be revised.  Thanks GB

## 2022-07-17 NOTE — Telephone Encounter (Signed)
Patient wife notified.

## 2022-07-19 ENCOUNTER — Ambulatory Visit: Payer: Medicare Other | Admitting: Family Medicine

## 2022-08-02 ENCOUNTER — Inpatient Hospital Stay: Payer: Medicare Other | Attending: Internal Medicine

## 2022-08-02 DIAGNOSIS — I82512 Chronic embolism and thrombosis of left femoral vein: Secondary | ICD-10-CM | POA: Insufficient documentation

## 2022-08-02 DIAGNOSIS — C44310 Basal cell carcinoma of skin of unspecified parts of face: Secondary | ICD-10-CM | POA: Insufficient documentation

## 2022-08-02 LAB — PROTIME-INR
INR: 2.7 — ABNORMAL HIGH (ref 0.8–1.2)
Prothrombin Time: 28.6 seconds — ABNORMAL HIGH (ref 11.4–15.2)

## 2022-08-02 LAB — CBC WITH DIFFERENTIAL/PLATELET
Abs Immature Granulocytes: 0.02 10*3/uL (ref 0.00–0.07)
Basophils Absolute: 0 10*3/uL (ref 0.0–0.1)
Basophils Relative: 1 %
Eosinophils Absolute: 0.4 10*3/uL (ref 0.0–0.5)
Eosinophils Relative: 7 %
HCT: 43.1 % (ref 39.0–52.0)
Hemoglobin: 14.7 g/dL (ref 13.0–17.0)
Immature Granulocytes: 0 %
Lymphocytes Relative: 24 %
Lymphs Abs: 1.4 10*3/uL (ref 0.7–4.0)
MCH: 33 pg (ref 26.0–34.0)
MCHC: 34.1 g/dL (ref 30.0–36.0)
MCV: 96.6 fL (ref 80.0–100.0)
Monocytes Absolute: 0.8 10*3/uL (ref 0.1–1.0)
Monocytes Relative: 14 %
Neutro Abs: 3.1 10*3/uL (ref 1.7–7.7)
Neutrophils Relative %: 54 %
Platelets: 198 10*3/uL (ref 150–400)
RBC: 4.46 MIL/uL (ref 4.22–5.81)
RDW: 14.9 % (ref 11.5–15.5)
WBC: 5.8 10*3/uL (ref 4.0–10.5)
nRBC: 0 % (ref 0.0–0.2)

## 2022-08-02 LAB — BASIC METABOLIC PANEL
Anion gap: 6 (ref 5–15)
BUN: 26 mg/dL — ABNORMAL HIGH (ref 8–23)
CO2: 25 mmol/L (ref 22–32)
Calcium: 9.9 mg/dL (ref 8.9–10.3)
Chloride: 108 mmol/L (ref 98–111)
Creatinine, Ser: 2.11 mg/dL — ABNORMAL HIGH (ref 0.61–1.24)
GFR, Estimated: 30 mL/min — ABNORMAL LOW (ref >60)
Glucose, Bld: 103 mg/dL — ABNORMAL HIGH (ref 70–99)
Potassium: 4.1 mmol/L (ref 3.5–5.1)
Sodium: 139 mmol/L (ref 135–145)

## 2022-08-03 ENCOUNTER — Telehealth: Payer: Self-pay

## 2022-08-03 ENCOUNTER — Ambulatory Visit: Payer: Medicare Other | Admitting: Family Medicine

## 2022-08-03 NOTE — Telephone Encounter (Signed)
Patient/wife notified of results.  Confirmed with Mrs. Solorzano the Coumadin dose is '5mg'$  Mon-Fri with 7 mg on Sat and Sun.

## 2022-08-03 NOTE — Telephone Encounter (Signed)
-----   Message from Cammie Sickle, MD sent at 08/02/2022  9:48 PM EDT ----- Hi- Please inform patient to continue current dose of Coumadin.  Blood work as Engineer, materials. GB

## 2022-08-06 ENCOUNTER — Other Ambulatory Visit: Payer: Self-pay | Admitting: Family Medicine

## 2022-08-07 ENCOUNTER — Other Ambulatory Visit: Payer: Medicare Other

## 2022-08-07 NOTE — Telephone Encounter (Signed)
Requested Prescriptions  Pending Prescriptions Disp Refills  . montelukast (SINGULAIR) 10 MG tablet [Pharmacy Med Name: MONTELUKAST SOD 10 MG TABLET] 30 tablet 0    Sig: Take 1 tablet (10 mg total) by mouth at bedtime.     Pulmonology:  Leukotriene Inhibitors Passed - 08/06/2022 12:23 PM      Passed - Valid encounter within last 12 months    Recent Outpatient Visits          1 month ago Leg cramp   [place], [place], DO   1 month ago Other fatigue   [place], [place], DO   2 months ago Post-COVID chronic fatigue   [place], [place], DO   3 months ago Other fatigue   [employer], [place], DO   6 months ago Peripheral edema   [place], Barb Merino, DO      Future Appointments            In 1 week Wynetta Emery, Barb Merino, DO [place], [place]   In 1 month  [facility], [facility]           . cetirizine (ZYRTEC) 5 MG tablet [Pharmacy Med Name: CETIRIZINE HCL 5 MG TABLET] 30 tablet 0    Sig: Take 1 tablet (5 mg total) by mouth daily.     Ear, Nose, and Throat:  Antihistamines 2 Failed - 08/06/2022 12:23 PM      Failed - Cr in normal range and within 360 days    Creatinine  Date Value Ref Range Status  05/03/2014 1.71 (H) 0.60 - 1.30 mg/dL Final   Creatinine, Ser  Date Value Ref Range Status  08/02/2022 2.11 (H) 0.61 - 1.24 mg/dL Final         Passed - Valid encounter within last 12 months    Recent Outpatient Visits          1 month ago Leg cramp   [place], [place], DO   1 month ago Other fatigue   [place], [place], DO   2 months ago Post-COVID chronic fatigue   [employer], [place], DO   3 months ago Other fatigue   [employer], [place], DO   6 months ago Peripheral edema   [place], [place], DO      Future Appointments             In 1 week Wynetta Emery, Barb Merino, DO [place], [place]   In 1 month  [facility], [place]

## 2022-08-14 ENCOUNTER — Telehealth: Payer: Self-pay | Admitting: Family Medicine

## 2022-08-14 ENCOUNTER — Encounter: Payer: Self-pay | Admitting: Family Medicine

## 2022-08-14 ENCOUNTER — Ambulatory Visit (INDEPENDENT_AMBULATORY_CARE_PROVIDER_SITE_OTHER): Payer: Medicare Other | Admitting: Family Medicine

## 2022-08-14 VITALS — BP 101/63 | HR 70 | Temp 97.5°F | Wt 162.6 lb

## 2022-08-14 DIAGNOSIS — R252 Cramp and spasm: Secondary | ICD-10-CM

## 2022-08-14 DIAGNOSIS — R5383 Other fatigue: Secondary | ICD-10-CM

## 2022-08-14 DIAGNOSIS — E538 Deficiency of other specified B group vitamins: Secondary | ICD-10-CM

## 2022-08-14 DIAGNOSIS — I129 Hypertensive chronic kidney disease with stage 1 through stage 4 chronic kidney disease, or unspecified chronic kidney disease: Secondary | ICD-10-CM | POA: Diagnosis not present

## 2022-08-14 NOTE — Assessment & Plan Note (Signed)
Running low again- ? Due to heat. Work on hydration. Recheck 1 month.

## 2022-08-14 NOTE — Telephone Encounter (Signed)
Noted. Thanks.

## 2022-08-14 NOTE — Progress Notes (Signed)
BP 101/63   Pulse 70   Temp (!) 97.5 F (36.4 C)   Wt 162 lb 9.6 oz (73.8 kg)   SpO2 96%   BMI 25.47 kg/m    Subjective:    Patient ID: Gregg Thomas, male    DOB: Jul 13, 1931, 88 y.o.   MRN: [id number]  HPI: Gregg Thomas is a 88 y.o. male  Chief Complaint  Patient presents with   Hypertension   Chronic Kidney Disease   Fatigue    Patient wife states he has been feeling very tired, falls asleep when he sits to down to watch tv.    HYPERTENSION Hypertension status: stable  Satisfied with current treatment? yes Duration of hypertension: chronic BP monitoring frequency:  not checking BP medication side effects:  no Medication compliance: excellent compliance Aspirin: no Recurrent headaches: no Visual changes: no Palpitations: no Dyspnea: no Chest pain: no Lower extremity edema: no Dizzy/lightheaded: no   Relevant past medical, surgical, family and social history reviewed and updated as indicated. Interim medical history since our last visit reviewed. Allergies and medications reviewed and updated.  Review of Systems  Constitutional:  Positive for fatigue. Negative for activity change, appetite change, chills, diaphoresis, fever and unexpected weight change.  Respiratory: Negative.    Cardiovascular: Negative.   Musculoskeletal: Negative.   Psychiatric/Behavioral: Negative.      Per HPI unless specifically indicated above     Objective:    BP 101/63   Pulse 70   Temp (!) 97.5 F (36.4 C)   Wt 162 lb 9.6 oz (73.8 kg)   SpO2 96%   BMI 25.47 kg/m   Wt Readings from Last 3 Encounters:  08/14/22 162 lb 9.6 oz (73.8 kg)  07/06/22 166 lb 12.8 oz (75.7 kg)  06/12/22 164 lb 12.8 oz (74.8 kg)    Physical Exam Vitals and nursing note reviewed.  Constitutional:      General: He is not in acute distress.    Appearance: Normal appearance. He is not ill-appearing, toxic-appearing or diaphoretic.  HENT:     Head: Normocephalic and  atraumatic.     Right Ear: External ear normal.     Left Ear: External ear normal.     Nose: Nose normal.     Mouth/Throat:     Mouth: Mucous membranes are moist.     Pharynx: Oropharynx is clear.  Eyes:     General: No scleral icterus.       Right eye: No discharge.        Left eye: No discharge.     Extraocular Movements: Extraocular movements intact.     Conjunctiva/sclera: Conjunctivae normal.     Pupils: Pupils are equal, round, and reactive to light.  Cardiovascular:     Rate and Rhythm: Normal rate and regular rhythm.     Pulses: Normal pulses.     Heart sounds: Normal heart sounds. No murmur heard.    No friction rub. No gallop.  Pulmonary:     Effort: Pulmonary effort is normal. No respiratory distress.     Breath sounds: Normal breath sounds. No stridor. No wheezing, rhonchi or rales.  Chest:     Chest wall: No tenderness.  Musculoskeletal:        General: Normal range of motion.     Cervical back: Normal range of motion and neck supple.  Skin:    General: Skin is warm and dry.     Capillary Refill: Capillary refill takes less than 2 seconds.  Coloration: Skin is not jaundiced or pale.     Findings: No bruising, erythema, lesion or rash.  Neurological:     General: No focal deficit present.     Mental Status: He is alert and oriented to person, place, and time. Mental status is at baseline.  Psychiatric:        Mood and Affect: Mood normal.        Behavior: Behavior normal.        Thought Content: Thought content normal.        Judgment: Judgment normal.     Results for orders placed or performed in visit on 08/02/22  Protime-INR  Result Value Ref Range   Prothrombin Time 28.6 (H) 11.4 - 15.2 seconds   INR 2.7 (H) 0.8 - 1.2  Basic metabolic panel  Result Value Ref Range   Sodium 139 135 - 145 mmol/L   Potassium 4.1 3.5 - 5.1 mmol/L   Chloride 108 98 - 111 mmol/L   CO2 25 22 - 32 mmol/L   Glucose, Bld 103 (H) 70 - 99 mg/dL   BUN 26 (H) 8 - 23 mg/dL    Creatinine, Ser 2.11 (H) 0.61 - 1.24 mg/dL   Calcium 9.9 8.9 - 10.3 mg/dL   GFR, Estimated 30 (L) >60 mL/min   Anion gap 6 5 - 15  CBC with Differential/Platelet  Result Value Ref Range   WBC 5.8 4.0 - 10.5 K/uL   RBC 4.46 4.22 - 5.81 MIL/uL   Hemoglobin 14.7 13.0 - 17.0 g/dL   HCT 43.1 39.0 - 52.0 %   MCV 96.6 80.0 - 100.0 fL   MCH 33.0 26.0 - 34.0 pg   MCHC 34.1 30.0 - 36.0 g/dL   RDW 14.9 11.5 - 15.5 %   Platelets 198 150 - 400 K/uL   nRBC 0.0 0.0 - 0.2 %   Neutrophils Relative % 54 %   Neutro Abs 3.1 1.7 - 7.7 K/uL   Lymphocytes Relative 24 %   Lymphs Abs 1.4 0.7 - 4.0 K/uL   Monocytes Relative 14 %   Monocytes Absolute 0.8 0.1 - 1.0 K/uL   Eosinophils Relative 7 %   Eosinophils Absolute 0.4 0.0 - 0.5 K/uL   Basophils Relative 1 %   Basophils Absolute 0.0 0.0 - 0.1 K/uL   Immature Granulocytes 0 %   Abs Immature Granulocytes 0.02 0.00 - 0.07 K/uL      Assessment & Plan:   Problem List Items Addressed This Visit       Genitourinary   Benign hypertensive renal disease - Primary    Running low again- ? Due to heat. Work on hydration. Recheck 1 month.         Other   B12 deficiency    B12 shot given today. Feeling well on it.       Other Visit Diagnoses     Leg cramp       Doing well. Continue to monitor. Call with any concerns.    Other fatigue       Discussed taking his gabapentin AFTER the news. Continue to monitor.         Follow up plan: Return in about 4 weeks (around 09/11/2022).

## 2022-08-14 NOTE — Telephone Encounter (Signed)
Patient had an appointment today and was advised to follow up regarding how he is taking his medication. Caller states patient should be taking 20 mg 1/2 tablet daily in the morning.   Patient has been taking 1 whole pill in the morning. Caller states patient will start tomorrow taking 1/2 pill in the morning daily and wanted PCP to be aware.

## 2022-08-14 NOTE — Assessment & Plan Note (Signed)
B12 shot given today. Feeling well on it.

## 2022-08-27 ENCOUNTER — Other Ambulatory Visit: Payer: Self-pay | Admitting: Nurse Practitioner

## 2022-08-28 NOTE — Telephone Encounter (Signed)
Resending as last was "no print" Requested Prescriptions  Pending Prescriptions Disp Refills  . enalapril (VASOTEC) 20 MG tablet [Pharmacy Med Name: ENALAPRIL MALEATE 20 MG TAB] 180 tablet 0    Sig: Take 1 tablet (20 mg total) by mouth 2 (two) times daily.     Cardiovascular:  ACE Inhibitors Failed - 08/27/2022 12:56 PM      Failed - Cr in normal range and within 180 days    Creatinine  Date Value Ref Range Status  05/03/2014 1.71 (H) 0.60 - 1.30 mg/dL Final   Creatinine, Ser  Date Value Ref Range Status  08/02/2022 2.11 (H) 0.61 - 1.24 mg/dL Final         Passed - K in normal range and within 180 days    Potassium  Date Value Ref Range Status  08/02/2022 4.1 3.5 - 5.1 mmol/L Final  05/03/2014 4.2 3.5 - 5.1 mmol/L Final         Passed - Patient is not pregnant      Passed - Last BP in normal range    BP Readings from Last 1 Encounters:  08/14/22 101/63         Passed - Valid encounter within last 6 months    Recent Outpatient Visits          2 weeks ago Benign hypertensive renal disease   [place], Megan P, DO   1 month ago Leg cramp   [place], [place], DO   2 months ago Other fatigue   [place], [place], DO   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

## 2022-09-03 ENCOUNTER — Other Ambulatory Visit: Payer: Self-pay | Admitting: Family Medicine

## 2022-09-04 NOTE — Telephone Encounter (Signed)
Requested Prescriptions  Pending Prescriptions Disp Refills  . montelukast (SINGULAIR) 10 MG tablet [Pharmacy Med Name: MONTELUKAST SOD 10 MG TABLET] 30 tablet 0    Sig: Take 1 tablet (10 mg total) by mouth at bedtime.     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

## 2022-09-05 ENCOUNTER — Encounter: Payer: Self-pay | Admitting: Internal Medicine

## 2022-09-05 ENCOUNTER — Inpatient Hospital Stay: Payer: Medicare Other | Attending: Internal Medicine

## 2022-09-05 ENCOUNTER — Inpatient Hospital Stay (HOSPITAL_BASED_OUTPATIENT_CLINIC_OR_DEPARTMENT_OTHER): Payer: Medicare Other | Admitting: Internal Medicine

## 2022-09-05 ENCOUNTER — Telehealth: Payer: Self-pay

## 2022-09-05 DIAGNOSIS — Z888 Allergy status to other drugs, medicaments and biological substances status: Secondary | ICD-10-CM | POA: Insufficient documentation

## 2022-09-05 DIAGNOSIS — Z7901 Long term (current) use of anticoagulants: Secondary | ICD-10-CM | POA: Insufficient documentation

## 2022-09-05 DIAGNOSIS — Z85828 Personal history of other malignant neoplasm of skin: Secondary | ICD-10-CM | POA: Insufficient documentation

## 2022-09-05 DIAGNOSIS — R5383 Other fatigue: Secondary | ICD-10-CM | POA: Insufficient documentation

## 2022-09-05 DIAGNOSIS — R252 Cramp and spasm: Secondary | ICD-10-CM | POA: Diagnosis not present

## 2022-09-05 DIAGNOSIS — Z823 Family history of stroke: Secondary | ICD-10-CM | POA: Insufficient documentation

## 2022-09-05 DIAGNOSIS — M791 Myalgia, unspecified site: Secondary | ICD-10-CM | POA: Insufficient documentation

## 2022-09-05 DIAGNOSIS — Z79899 Other long term (current) drug therapy: Secondary | ICD-10-CM | POA: Insufficient documentation

## 2022-09-05 DIAGNOSIS — C44310 Basal cell carcinoma of skin of unspecified parts of face: Secondary | ICD-10-CM | POA: Insufficient documentation

## 2022-09-05 DIAGNOSIS — I82512 Chronic embolism and thrombosis of left femoral vein: Secondary | ICD-10-CM | POA: Diagnosis not present

## 2022-09-05 DIAGNOSIS — Z86711 Personal history of pulmonary embolism: Secondary | ICD-10-CM | POA: Diagnosis not present

## 2022-09-05 DIAGNOSIS — I82402 Acute embolism and thrombosis of unspecified deep veins of left lower extremity: Secondary | ICD-10-CM | POA: Insufficient documentation

## 2022-09-05 DIAGNOSIS — Z85820 Personal history of malignant melanoma of skin: Secondary | ICD-10-CM | POA: Insufficient documentation

## 2022-09-05 DIAGNOSIS — M2550 Pain in unspecified joint: Secondary | ICD-10-CM | POA: Diagnosis not present

## 2022-09-05 DIAGNOSIS — Z8249 Family history of ischemic heart disease and other diseases of the circulatory system: Secondary | ICD-10-CM | POA: Diagnosis not present

## 2022-09-05 DIAGNOSIS — N183 Chronic kidney disease, stage 3 unspecified: Secondary | ICD-10-CM | POA: Insufficient documentation

## 2022-09-05 LAB — PROTIME-INR
INR: 3.6 — ABNORMAL HIGH (ref 0.8–1.2)
Prothrombin Time: 36 seconds — ABNORMAL HIGH (ref 11.4–15.2)

## 2022-09-05 NOTE — Progress Notes (Signed)
New leg/ankle pain and weakness.  Has started on Gabapentin with no relief.  Current Coumadin dose is '5mg'$  Mon thru Fri and 7 mg Sat and Sun  BP 93/60, HR 65 today.

## 2022-09-05 NOTE — Assessment & Plan Note (Addendum)
#   Left lower extremity chronic DVT/PE [9323]-FT new thromboembolic events. STABLE on Coumadin/warfarin- on 5 mg a day;S-S: 5+2 mg  [no major fluctuations noted on hedgehog inhibitor].  INR from today-pending- STABLE.   # Basal cell carcinoma: on Hedgehog inhibitor [Dr.Dasher]- STABLE.   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  Recommend speaking to Dr. Evorn Gong for further instructions.  #CKD-III [GFR 30s]-STABLE; Dr.Lateef- STABLE.   # DISPOSITION: # PT/INR every month x 6  # in 6 months- MD; cbc/bmp/Pt/INR-Dr.B  Addendum: Patient's INR slightly elevated at 3.6; recommend Coumadin 5 mg once a day.   Cc;

## 2022-09-05 NOTE — Telephone Encounter (Signed)
-----   Message from Cammie Sickle, MD sent at 09/05/2022  4:10 PM EDT ----- Regarding: FW: Please inform patient/wife that INR is slightly high.  Would recommend going back to Coumadin 5 mg once a day.  Continue blood checks once a month  Thank you GB ----- Message ----- From: Interface, Lab In [place] Sent: 09/05/2022   3:13 PM EDT To: Cammie Sickle, MD

## 2022-09-05 NOTE — Telephone Encounter (Signed)
Pt notified by detailed voicemail.

## 2022-09-05 NOTE — Progress Notes (Signed)
[facility] CONSULT NOTE  Patient Care Team: Valerie Roys, DO as PCP - General (Family Medicine) Lequita Asal, MD (Inactive) as Referring Physician (Hematology and Oncology) Dagoberto Ligas, MD as Referring Physician (Internal Medicine) Merril Abbe, MD as Referring Physician (Dermatology) Cammie Sickle, MD as Consulting Physician (Hematology and Oncology)  CHIEF COMPLAINTS/PURPOSE OF CONSULTATION: Left lower extremity DVT  #  Oncology History Overview Note  #Left lower extremity chronic DVT/PE [2011]-on Coumadin [Drs.Gittin/[place]]  #2021-Left facial basal cell carcinoma ERIVEDGE- [Dr.Dasher]  #CKD [Dr.Lateef]/    Squamous cell carcinoma, 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  Accompanied by his wife.  [name] 88 y.o.  male history of chronic left lower extremity DVT on Coumadin and history of CKD-III-IV; on erivedge for [facility] is here for follow-up.   Patient continues to be on erivedge continuously [previously intermittently] for basal cell carcinoma   Otherwise no recent hospitalizations.  Denies any weight loss.  Denies any nausea vomiting.  No diarrhea.  Denies any blood in stools or black or stools.  No nausea no vomiting.   Review of Systems  Constitutional:  Positive for malaise/fatigue. Negative for chills, diaphoresis, fever and weight loss.  HENT:  Negative for nosebleeds and sore throat.   Eyes:  Negative for double vision.  Respiratory:  Negative for cough, hemoptysis, sputum production, shortness of breath and wheezing.   Cardiovascular:  Negative for chest pain, palpitations, orthopnea and leg swelling.  Gastrointestinal:  Negative for abdominal pain, blood in stool, constipation, diarrhea, heartburn, melena, nausea and vomiting.  Genitourinary:  Negative for dysuria, frequency and urgency.  Musculoskeletal:  Positive  for joint pain and myalgias. Negative for back pain.  Skin: Negative.  Negative for itching and rash.  Neurological:  Negative for dizziness, tingling, focal weakness, weakness and headaches.  Endo/Heme/Allergies:  Bruises/bleeds easily.  Psychiatric/Behavioral:  Positive for memory loss. Negative for depression. The patient is not nervous/anxious and does not have insomnia.      MEDICAL HISTORY:  Past Medical History:  Diagnosis Date   Chronic kidney disease    Clotting disorder ([place])    Heart murmur    Skin cancer     SURGICAL HISTORY: Past Surgical History:  Procedure Laterality Date   biopsy Right 08/26/2019   temple biopsy - dr.dasher    EYE SURGERY Right    benign growth on right eye    KNEE SURGERY Right    MELANOMA EXCISION Bilateral    lower back     SOCIAL HISTORY: Social History   Socioeconomic History   Marital status: Married    Spouse name: Not on file   Number of children: Not on file   Years of education: 13 years    Highest education level: Some college, no degree  Occupational History   Occupation: retired  Tobacco Use   Smoking status: Never   Smokeless tobacco: Never  Vaping Use   Vaping Use: Never used  Substance and Sexual Activity   Alcohol use: No   Drug use: No   Sexual activity: Not Currently  Other Topics Concern   Not on file  Social History Narrative   [place] 2 times a week, alk 3 times a week    Social Determinants of Health   Financial Resource Strain: Low Risk  (09/22/2021)  Overall Financial Resource Strain (CARDIA)    Difficulty of Paying Living Expenses: Not hard at all  Food Insecurity: No Food Insecurity (09/22/2021)   Hunger Vital Sign    Worried About Running Out of Food in the Last Year: Never true    Ran Out of Food in the Last Year: Never true  Transportation Needs: No Transportation Needs (09/22/2021)   PRAPARE - Radiographer, therapeutic (Medical): No    Lack of Transportation (Non-Medical): No  Physical Activity: Sufficiently Active (09/22/2021)   Exercise Vital Sign    Days of Exercise per Week: 5 days    Minutes of Exercise per Session: 30 min  Stress: No Stress Concern Present (09/22/2021)   [place]    Feeling of Stress : Not at all  Social Connections: [place] (09/04/2018)   Social Connection and Isolation Panel [NHANES]    Frequency of Communication with Friends and Family: More than three times a week    Frequency of Social Gatherings with Friends and Family: More than three times a week    Attends Religious Services: More than 4 times per year    Active Member of [employer] or Organizations: Yes    Attends Music therapist: More than 4 times per year    Marital Status: Married  Human resources officer Violence: Not At Risk (09/04/2018)   Humiliation, Afraid, Rape, and Kick questionnaire    Fear of Current or Ex-Partner: No    Emotionally Abused: No    Physically Abused: No    Sexually Abused: No    FAMILY HISTORY: Family History  Problem Relation Age of Onset   Stroke Mother    Cerebral aneurysm Father     ALLERGIES:  is allergic to amlodipine.  MEDICATIONS:  Current Outpatient Medications  Medication Sig Dispense Refill   ascorbic acid (VITAMIN C) 1000 MG tablet Take by mouth.     cetirizine (ZYRTEC) 5 MG tablet Take 1 tablet (5 mg total) by mouth daily. 30 tablet 0   Cholecalciferol (VITAMIN D3 PO) Take 1 tablet by mouth daily.     Cyanocobalamin (VITAMIN B-12 ER PO) Take 1,000 mcg by mouth daily at 6 (six) AM.     donepezil (ARICEPT) 10 MG tablet Take 1 tablet (10 mg total) by mouth at bedtime. 90 tablet 1   ELDERBERRY PO Take 1 tablet by mouth daily.     enalapril (VASOTEC) 10 MG tablet Take 1 tablet (10 mg total) by mouth daily. (Patient taking differently: Take 5 mg by mouth daily.) 90 tablet 1    ERIVEDGE 150 MG capsule Take 150 mg by mouth daily.     fluticasone (FLONASE) 50 MCG/ACT nasal spray Place 2 sprays into both nostrils daily. 16 g 6   gabapentin (NEURONTIN) 100 MG capsule Take 1 capsule (100 mg total) by mouth at bedtime. 30 capsule 3   Grape Seed Extract 50 MG CAPS Take 1,300 mg by mouth daily.      montelukast (SINGULAIR) 10 MG tablet Take 1 tablet (10 mg total) by mouth at bedtime. 90 tablet 1   warfarin (COUMADIN) 2 MG tablet On Saturday and Sunday-take 1 pill a day [along with 5 mg-a total of 7 mg]. 30 tablet 1   warfarin (COUMADIN) 5 MG tablet Take 1 tablet (5 mg total) by mouth one time only at 6 PM. 90 tablet 0   zinc gluconate 50 MG tablet Take by mouth.  Ipratropium-Albuterol (COMBIVENT) 20-100 MCG/ACT AERS respimat Inhale 1 puff into the lungs every 6 (six) hours. (Patient not taking: Reported on 05/15/2022)     Current Facility-Administered Medications  Medication Dose Route Frequency Provider Last Rate Last Admin   cyanocobalamin ((VITAMIN B-12)) injection 1,000 mcg  1,000 mcg Intramuscular Q30 days Wynetta Emery, Megan P, DO   1,000 mcg at 08/14/22 1334      .  PHYSICAL EXAMINATION: ECOG PERFORMANCE STATUS: 0 - Asymptomatic  Vitals:   09/05/22 1400  BP: 93/60  Pulse: 65  Resp: 16  Temp: (!) 97.2 F (36.2 C)    Filed Weights   09/05/22 1400  Weight: 161 lb 6.4 oz (73.2 kg)     Physical Exam Constitutional:      Comments: Elderly appearing Caucasian male patient accompanied by his wife.  Is walking himself.  HENT:     Head: Normocephalic and atraumatic.     Mouth/Throat:     Pharynx: No oropharyngeal exudate.  Eyes:     Pupils: Pupils are equal, round, and reactive to light.  Cardiovascular:     Rate and Rhythm: Normal rate and regular rhythm.  Pulmonary:     Effort: Pulmonary effort is normal. No respiratory distress.     Breath sounds: Normal breath sounds. No wheezing.  Abdominal:     General: Bowel sounds are normal. There is no  distension.     Palpations: Abdomen is soft. There is no mass.     Tenderness: There is no abdominal tenderness. There is no guarding or rebound.  Musculoskeletal:        General: No tenderness. Normal range of motion.     Cervical back: Normal range of motion and neck supple.  Skin:    General: Skin is warm.  Neurological:     Mental Status: He is alert and oriented to person, place, and time.  Psychiatric:        Mood and Affect: Affect normal.      LABORATORY DATA:  I have reviewed the data as listed Lab Results  Component Value Date   WBC 5.8 08/02/2022   HGB 14.7 08/02/2022   HCT 43.1 08/02/2022   MCV 96.6 08/02/2022   PLT 198 08/02/2022   Recent Labs    11/01/21 0722 11/02/21 0637 11/16/21 1516 03/06/22 1502 04/19/22 1337 07/06/22 1413 08/02/22 1422  NA 137 140   < > 141 144 141 139  K 4.0 4.4   < > 4.4 4.8 5.2 4.1  CL 108 110   < > 109 108* 107* 108  CO2 21* 23   < > '26 24 25 25  ' GLUCOSE 165* 144*   < > 77 95 96 103*  BUN 43* 54*   < > '21 21 17 ' 26*  CREATININE 2.12* 2.12*   < > 1.99* 1.89* 1.68* 2.11*  CALCIUM 9.4 9.3   < > 9.4 10.9* 10.7* 9.9  GFRNONAA 29* 29*  --  32*  --   --  30*  PROT 6.8 6.4*  --   --  6.8  --   --   ALBUMIN 3.4* 2.9*  --   --  4.1  --   --   AST 32 30  --   --  28  --   --   ALT 23 23  --   --  23  --   --   ALKPHOS 78 77  --   --  109  --   --   BILITOT  0.7 0.8  --   --  0.3  --   --    < > = values in this interval not displayed.    RADIOGRAPHIC STUDIES: I have personally reviewed the radiological images as listed and agreed with the findings in the report. No results found.  ASSESSMENT & PLAN:   Chronic deep vein thrombosis (DVT) of femoral vein of left lower extremity (HCC) # Left lower extremity chronic DVT/PE [9842]-JI new thromboembolic events. STABLE on Coumadin/warfarin- on 5 mg a day;S-S: 5+2 mg  [no major fluctuations noted on hedgehog inhibitor].  INR from today-pending- STABLE.   # Basal cell carcinoma: on  Hedgehog inhibitor [Dr.Dasher]- STABLE.   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  Recommend speaking to Dr. Evorn Gong for further instructions.  #CKD-III [GFR 30s]-STABLE; Dr.Lateef- STABLE.   # DISPOSITION: # PT/INR every month x 6  # in 6 months- MD; cbc/bmp/Pt/INR-Dr.B  Addendum: Patient's INR slightly elevated at 3.6; recommend Coumadin 5 mg once a day.   Cc;   All questions were answered. The patient knows to call the clinic with any problems, questions or concerns.    Cammie Sickle, MD 09/06/2022 7:48 AM

## 2022-09-10 ENCOUNTER — Other Ambulatory Visit: Payer: Self-pay | Admitting: Internal Medicine

## 2022-09-11 ENCOUNTER — Other Ambulatory Visit: Payer: Self-pay | Admitting: Internal Medicine

## 2022-09-18 ENCOUNTER — Ambulatory Visit: Payer: Medicare Other | Admitting: Family Medicine

## 2022-09-18 ENCOUNTER — Encounter: Payer: Self-pay | Admitting: Family Medicine

## 2022-09-18 VITALS — BP 93/60 | HR 69 | Temp 97.9°F | Wt 156.4 lb

## 2022-09-18 DIAGNOSIS — E538 Deficiency of other specified B group vitamins: Secondary | ICD-10-CM

## 2022-09-18 DIAGNOSIS — R29898 Other symptoms and signs involving the musculoskeletal system: Secondary | ICD-10-CM

## 2022-09-18 DIAGNOSIS — R2689 Other abnormalities of gait and mobility: Secondary | ICD-10-CM | POA: Diagnosis not present

## 2022-09-18 DIAGNOSIS — I952 Hypotension due to drugs: Secondary | ICD-10-CM

## 2022-09-18 MED ORDER — GABAPENTIN 100 MG PO CAPS
200.0000 mg | ORAL_CAPSULE | Freq: Every day | ORAL | 3 refills | Status: DC
Start: 2022-09-18 — End: 2022-10-18

## 2022-09-18 NOTE — Progress Notes (Signed)
BP 93/60   Pulse 69   Temp 97.9 F (36.6 C)   Wt 156 lb 6.4 oz (70.9 kg)   SpO2 96%   BMI 24.50 kg/m    Subjective:    Patient ID: Gregg Thomas, male    DOB: February 28, 1931, 88 y.o.   MRN: [id number]  HPI: Gregg Thomas is a 88 y.o. male  Chief Complaint  Patient presents with   Hypertension   Fatigue    Patient states he is very tired, to the point where he can hardly walk. Patient states he feels off balance in his legs and feet.    leg cramp    Patient states he is still having leg cramps    HYPERTENSION  Hypertension status: overtreated  Satisfied with current treatment? no Duration of hypertension: chronic BP monitoring frequency:  a few times a week BP range: low BP medication side effects:  yes Medication compliance: excellent compliance Aspirin: no Recurrent headaches: no Visual changes: no Palpitations: no Dyspnea: no Chest pain: no Lower extremity edema: no Dizzy/lightheaded: yes  Has been having more trouble getting around. Has been very tired and feels like he can barely walk. He has been having leg cramps and feeling very poorly. He would like some PT to help him get stronger. No other concerns or complaints at this time.  Relevant past medical, surgical, family and social history reviewed and updated as indicated. Interim medical history since our last visit reviewed. Allergies and medications reviewed and updated.  Review of Systems  Constitutional: Negative.   Respiratory: Negative.    Cardiovascular: Negative.   Gastrointestinal: Negative.   Musculoskeletal:  Positive for gait problem and myalgias. Negative for arthralgias, back pain, joint swelling, neck pain and neck stiffness.  Skin: Negative.   Neurological:  Negative for dizziness, tremors, seizures, syncope, facial asymmetry, speech difficulty, weakness, light-headedness, numbness and headaches.  Psychiatric/Behavioral: Negative.      Per HPI unless specifically  indicated above     Objective:    BP 93/60   Pulse 69   Temp 97.9 F (36.6 C)   Wt 156 lb 6.4 oz (70.9 kg)   SpO2 96%   BMI 24.50 kg/m   Wt Readings from Last 3 Encounters:  09/18/22 156 lb 6.4 oz (70.9 kg)  09/05/22 161 lb 6.4 oz (73.2 kg)  08/14/22 162 lb 9.6 oz (73.8 kg)    Physical Exam Vitals and nursing note reviewed.  Constitutional:      General: He is not in acute distress.    Appearance: Normal appearance. He is normal weight. He is not ill-appearing, toxic-appearing or diaphoretic.  HENT:     Head: Normocephalic and atraumatic.     Right Ear: External ear normal.     Left Ear: External ear normal.     Nose: Nose normal.     Mouth/Throat:     Mouth: Mucous membranes are moist.     Pharynx: Oropharynx is clear.  Eyes:     General: No scleral icterus.       Right eye: No discharge.        Left eye: No discharge.     Extraocular Movements: Extraocular movements intact.     Conjunctiva/sclera: Conjunctivae normal.     Pupils: Pupils are equal, round, and reactive to light.  Cardiovascular:     Rate and Rhythm: Normal rate and regular rhythm.     Pulses: Normal pulses.     Heart sounds: Normal heart sounds. No murmur heard.  No friction rub. No gallop.  Pulmonary:     Effort: Pulmonary effort is normal. No respiratory distress.     Breath sounds: Normal breath sounds. No stridor. No wheezing, rhonchi or rales.  Chest:     Chest wall: No tenderness.  Musculoskeletal:        General: Normal range of motion.     Cervical back: Normal range of motion and neck supple.  Skin:    General: Skin is warm and dry.     Capillary Refill: Capillary refill takes less than 2 seconds.     Coloration: Skin is not jaundiced or pale.     Findings: No bruising, erythema, lesion or rash.  Neurological:     General: No focal deficit present.     Mental Status: He is alert and oriented to person, place, and time. Mental status is at baseline.  Psychiatric:        Mood and  Affect: Mood normal.        Behavior: Behavior normal.        Thought Content: Thought content normal.        Judgment: Judgment normal.     Results for orders placed or performed in visit on 09/05/22  Protime-INR  Result Value Ref Range   Prothrombin Time 36.0 (H) 11.4 - 15.2 seconds   INR 3.6 (H) 0.8 - 1.2      Assessment & Plan:   Problem List Items Addressed This Visit   None Visit Diagnoses     Hypotension due to drugs    -  Primary   Stop medicine. Will recheck 1 month. Call with any cocnerns.    Relevant Orders   Ambulatory referral to [facility] problem       Will get him set up with PT at home to help with falls and balance. Await their input. Call with any concerns.    Relevant Orders   Ambulatory referral to [facility] deconditioning       Will get him set up with PT at home to help with falls and balance. Await their input. Call with any concerns.    Relevant Orders   Ambulatory referral to [facility]        Follow up plan: Return in about 4 weeks (around 10/16/2022).

## 2022-09-24 ENCOUNTER — Other Ambulatory Visit: Payer: Self-pay | Admitting: Family Medicine

## 2022-09-24 ENCOUNTER — Encounter: Payer: Self-pay | Admitting: Family Medicine

## 2022-09-24 ENCOUNTER — Ambulatory Visit (INDEPENDENT_AMBULATORY_CARE_PROVIDER_SITE_OTHER): Payer: Medicare Other | Admitting: *Deleted

## 2022-09-24 VITALS — BP 135/83

## 2022-09-24 DIAGNOSIS — R29898 Other symptoms and signs involving the musculoskeletal system: Secondary | ICD-10-CM

## 2022-09-24 DIAGNOSIS — Z0000 Encounter for general adult medical examination without abnormal findings: Secondary | ICD-10-CM

## 2022-09-24 DIAGNOSIS — R2689 Other abnormalities of gait and mobility: Secondary | ICD-10-CM

## 2022-09-24 NOTE — Progress Notes (Signed)
Subjective:   Gregg Thomas is a 88 y.o. male who presents for Medicare Annual/Subsequent preventive examination.  I connected with  [name] on 09/24/22 by a telephone enabled telemedicine application and verified that I am speaking with the correct person using two identifiers.   I discussed the limitations of evaluation and management by telemedicine. The patient expressed understanding and agreed to proceed.  Patient location: home  Provider location:  Tele-health-home  Review of Systems     Cardiac Risk Factors include: advanced age (>62mn, >>70women);family history of premature cardiovascular disease;male gender     Objective:    Today's Vitals   09/24/22 1107  BP: 135/83   There is no height or weight on file to calculate BMI.     09/24/2022   11:06 AM 09/05/2022    1:48 PM 03/06/2022    2:42 PM 11/01/2021    1:49 PM 09/22/2021    1:47 PM 05/01/2021    2:51 PM 09/19/2020    1:50 PM  Advanced Directives  Does Patient Have a Medical Advance Directive? Yes No No No Yes No Yes  Type of ATheatre managerof [place] will  [place] will  Does patient want to make changes to medical advance directive?      No - Patient declined   Copy of [place]? No - copy requested    No - copy requested  No - copy requested  Would patient like information on creating a medical advance directive? No - Patient declined No - Patient declined No - Patient declined        Current Medications (verified) Outpatient Encounter Medications as of 09/24/2022  Medication Sig   ascorbic acid (VITAMIN C) 1000 MG tablet Take by mouth.   cetirizine (ZYRTEC) 5 MG tablet Take 1 tablet (5 mg total) by mouth daily.   Cholecalciferol (VITAMIN D3 PO) Take 1 tablet by mouth daily.   Cyanocobalamin (VITAMIN B-12 ER PO) Take 1,000 mcg by mouth daily at 6 (six) AM.   donepezil  (ARICEPT) 10 MG tablet Take 1 tablet (10 mg total) by mouth at bedtime.   ELDERBERRY PO Take 1 tablet by mouth daily.   fluticasone (FLONASE) 50 MCG/ACT nasal spray Place 2 sprays into both nostrils daily.   gabapentin (NEURONTIN) 100 MG capsule Take 2 capsules (200 mg total) by mouth at bedtime.   Grape Seed Extract 50 MG CAPS Take 1,300 mg by mouth daily.    Ipratropium-Albuterol (COMBIVENT) 20-100 MCG/ACT AERS respimat Inhale 1 puff into the lungs every 6 (six) hours.   montelukast (SINGULAIR) 10 MG tablet Take 1 tablet (10 mg total) by mouth at bedtime.   warfarin (COUMADIN) 2 MG tablet On Saturday and Sunday-take 1 pill a day [along with 5 mg-a total of 7 mg].   warfarin (COUMADIN) 5 MG tablet Take 1 tablet (5 mg total) by mouth one time only at 6 PM.   zinc gluconate 50 MG tablet Take by mouth.   ERIVEDGE 150 MG capsule Take 150 mg by mouth daily. (Patient not taking: Reported on 09/24/2022)   Facility-Administered Encounter Medications as of 09/24/2022  Medication   cyanocobalamin ((VITAMIN B-12)) injection 1,000 mcg    Allergies (verified) Amlodipine   History: Past Medical History:  Diagnosis Date   Chronic kidney disease    Clotting disorder (HCC)    Heart murmur    Skin cancer  Past Surgical History:  Procedure Laterality Date   biopsy Right 08/26/2019   temple biopsy - dr.dasher    EYE SURGERY Right    benign growth on right eye    KNEE SURGERY Right    MELANOMA EXCISION Bilateral    lower back    Family History  Problem Relation Age of Onset   Stroke Mother    Cerebral aneurysm Father    Social History   Socioeconomic History   Marital status: Married    Spouse name: Not on file   Number of children: Not on file   Years of education: 13 years    Highest education level: Some college, no degree  Occupational History   Occupation: retired  Tobacco Use   Smoking status: Never   Smokeless tobacco: Never  Vaping Use   Vaping Use: Never used  Substance  and Sexual Activity   Alcohol use: No   Drug use: No   Sexual activity: Not Currently  Other Topics Concern   Not on file  Social History Narrative   [place] 2 times a week, alk 3 times a week    Social Determinants of Health   Financial Resource Strain: Low Risk  (09/24/2022)   Overall Financial Resource Strain (CARDIA)    Difficulty of Paying Living Expenses: Not hard at all  Food Insecurity: No Food Insecurity (09/24/2022)   Hunger Vital Sign    Worried About Running Out of Food in the Last Year: Never true    Ran Out of Food in the Last Year: Never true  Transportation Needs: No Transportation Needs (09/24/2022)   PRAPARE - Hydrologist (Medical): No    Lack of Transportation (Non-Medical): No  Physical Activity: Insufficiently Active (09/24/2022)   Exercise Vital Sign    Days of Exercise per Week: 2 days    Minutes of Exercise per Session: 20 min  Stress: No Stress Concern Present (09/24/2022)   [place]    Feeling of Stress : Not at all  Social Connections: Moderately Integrated (09/24/2022)   Social Connection and Isolation Panel [NHANES]    Frequency of Communication with Friends and Family: Once a week    Frequency of Social Gatherings with Friends and Family: Once a week    Attends Religious Services: More than 4 times per year    Active Member of [employer] or Organizations: No    Attends Music therapist: More than 4 times per year    Marital Status: Married    Tobacco Counseling Counseling given: Not Answered   Clinical Intake:  Pre-visit preparation completed: Yes  Pain : No/denies pain     Diabetes: No  How often do you need to have someone help you when you read instructions, pamphlets, or other written materials from your doctor or pharmacy?: 1 - Never  Diabetic?  no  Interpreter  Needed?: No  Information entered by :: Leroy Kennedy LPN   Activities of Daily Living    09/24/2022   11:19 AM 11/01/2021    1:30 PM  In your present state of health, do you have any difficulty performing the following activities:  Hearing? 1 0  Vision? 0 0  Difficulty concentrating or making decisions? 0 0  Walking or climbing stairs? 0 0  Dressing or bathing? 0 0  Doing errands, shopping? 1 0  Preparing  Food and eating ? N   Using the Toilet? N   In the past six months, have you accidently leaked urine? N   Do you have problems with loss of bowel control? N   Managing your Medications? N   Managing your Finances? N   Housekeeping or managing your Housekeeping? N     Patient Care Team: Valerie Roys, DO as PCP - General (Family Medicine) Lequita Asal, MD (Inactive) as Referring Physician (Hematology and Oncology) Dagoberto Ligas, MD as Referring Physician (Internal Medicine) Merril Abbe, MD as Referring Physician (Dermatology) Cammie Sickle, MD as Consulting Physician (Hematology and Oncology)  Indicate any recent Medical Services you may have received from other than [facility] providers in the past year (date may be approximate).     Assessment:   This is a routine wellness examination for Tyjuan.  Hearing/Vision screen Hearing Screening - Comments:: Has hearing aids Vision Screening - Comments:: Not up to date  Dietary issues and exercise activities discussed: Current Exercise Habits: Home exercise routine, Type of exercise: Other - see comments (excersie bike), Time (Minutes): 15, Frequency (Times/Week): 3, Weekly Exercise (Minutes/Week): 45, Intensity: Mild   Goals Addressed   None    Depression Screen    09/24/2022   11:12 AM 09/18/2022    2:24 PM 08/14/2022    1:16 PM 06/12/2022    1:50 PM 05/15/2022    3:36 PM 04/19/2022    1:19 PM 11/16/2021    2:45 PM  PHQ 2/9 Scores  PHQ - 2 Score '2 4 3 1 4 ' 0 0  PHQ- 9 Score '7 18 9 6 11 5 ' 0    Fall  Risk    09/24/2022   11:06 AM 09/18/2022    2:24 PM 02/08/2022    2:15 PM 09/22/2021    1:47 PM 09/19/2020    1:51 PM  Fall Risk   Falls in the past year? 1 1 0 0 0  Number falls in past yr: 0 1 0    Injury with Fall? 0 0 0    Risk for fall due to :  History of fall(s) Impaired mobility;History of fall(s) Medication side effect Medication side effect  Follow up Falls evaluation completed;Falls prevention discussed;Education provided Falls evaluation completed Falls evaluation completed Falls evaluation completed;Education provided;Falls prevention discussed Falls evaluation completed;Education provided;Falls prevention discussed    FALL RISK PREVENTION PERTAINING TO THE HOME:  Any stairs in or around the home? Yes  If so, are there any without handrails? No  Home free of loose throw rugs in walkways, pet beds, electrical cords, etc? Yes  Adequate lighting in your home to reduce risk of falls? Yes   ASSISTIVE DEVICES UTILIZED TO PREVENT FALLS:  Life alert? No  Use of a cane, walker or w/c? Yes  Grab bars in the bathroom? Yes  Shower chair or bench in shower? Yes  Elevated toilet seat or a handicapped toilet? Yes   TIMED UP AND GO:  Was the test performed? No .    Cognitive Function:    06/20/2020    4:01 PM  MMSE - Mini Mental State Exam  Orientation to time 4  Orientation to Place 5  Registration 3  Attention/ Calculation 5  Recall 1  Language- name 2 objects 2  Language- repeat 1  Language- follow 3 step command 3  Language- read & follow direction 1  Write a sentence 1  Copy design 1  Total score 27  09/24/2022   11:08 AM 09/22/2021    1:49 PM 09/19/2020    1:53 PM 06/20/2020    3:26 PM 09/04/2018    3:57 PM  6CIT Screen  What Year? 0 points 0 points 0 points 0 points 0 points  What month? 0 points 0 points 0 points 0 points 0 points  What time? 0 points 3 points 3 points 0 points 0 points  Count back from 20 0 points 0 points 0 points 0 points 0 points   Months in reverse 0 points 0 points 2 points 0 points 0 points  Repeat phrase 0 points 4 points 0 points 2 points 0 points  Total Score 0 points 7 points 5 points 2 points 0 points    Immunizations Immunization History  Administered Date(s) Administered   Fluad Quad(high Dose 65+) 09/20/2020   Influenza, High Dose Seasonal PF 10/09/2016, 09/04/2018   Influenza,inj,Quad PF,6+ Mos 09/30/2021   Influenza-Unspecified 10/24/2017, 09/08/2019   Moderna Sars-Covid-2 Vaccination 02/12/2020, 03/11/2020, 11/28/2020, 04/24/2021   Pneumococcal Conjugate-13 10/19/2021   Pneumococcal Polysaccharide-23 08/29/2017   Td 02/01/2016    TDAP status: Up to date  Flu Vaccine status: Up to date  Pneumococcal vaccine status: Up to date  Covid-19 vaccine status: Information provided on how to obtain vaccines.   Qualifies for Shingles Vaccine? Yes   Zostavax completed No   Shingrix Completed?: No.    Education has been provided regarding the importance of this vaccine. Patient has been advised to call insurance company to determine out of pocket expense if they have not yet received this vaccine. Advised may also receive vaccine at local pharmacy or Health Dept. Verbalized acceptance and understanding.  Screening Tests Health Maintenance  Topic Date Due   Zoster Vaccines- Shingrix (1 of 2) 12/18/2022 (Originally 09/13/1950)   INFLUENZA VACCINE  03/31/2023 (Originally 07/31/2022)   TETANUS/TDAP  01/31/2026   Pneumonia Vaccine 41+ Years old  Completed   HPV VACCINES  Aged Out   COVID-19 Vaccine  Discontinued    Health Maintenance  There are no preventive care reminders to display for this patient.  Colorectal cancer screening: No longer required.   Lung Cancer Screening: (Low Dose CT Chest recommended if Age 15-80 years, 30 pack-year currently smoking OR have quit w/in 15years.) does not qualify.   Lung Cancer Screening Referral:   Additional Screening:  Hepatitis C Screening: does not  qualify;  Vision Screening: Recommended annual ophthalmology exams for early detection of glaucoma and other disorders of the eye. Is the patient up to date with their annual eye exam?  No  Who is the provider or what is the name of the office in which the patient attends annual eye exams?  If pt is not established with a provider, would they like to be referred to a provider to establish care? No .   Dental Screening: Recommended annual dental exams for proper oral hygiene  Community Resource Referral / Chronic Care Management: CRR required this visit?  No   CCM required this visit?  No      Plan:     I have personally reviewed and noted the following in the patient's chart:   Medical and social history Use of alcohol, tobacco or illicit drugs  Current medications and supplements including opioid prescriptions. Patient is not currently taking opioid prescriptions. Functional ability and status Nutritional status Physical activity Advanced directives List of other physicians Hospitalizations, surgeries, and ER visits in previous 12 months Vitals Screenings to include cognitive, depression, and falls  Referrals and appointments  In addition, I have reviewed and discussed with patient certain preventive protocols, quality metrics, and best practice recommendations. A written personalized care plan for preventive services as well as general preventive health recommendations were provided to patient.     Leroy Kennedy, LPN   [id number]/33/[id number]   Nurse Notes:

## 2022-09-24 NOTE — Patient Instructions (Signed)
Gregg Thomas , Thank you for taking time to come for your Medicare Wellness Visit. I appreciate your ongoing commitment to your health goals. Please review the following plan we discussed and let me know if I can assist you in the future.   Screening recommendations/referrals: Colonoscopy: no longer required Recommended yearly ophthalmology/optometry visit for glaucoma screening and checkup Recommended yearly dental visit for hygiene and checkup  Vaccinations: Influenza vaccine: Education provided Pneumococcal vaccine: up to date Tdap vaccine: up to date Shingles vaccine: Education provided    Advanced directives: yes    Next appointment: 10-18-2022 @ 2:00 [facility] 65 Years and Older, Male Preventive care refers to lifestyle choices and visits with your health care provider that can promote health and wellness. What does preventive care include? A yearly physical exam. This is also called an annual well check. Dental exams once or twice a year. Routine eye exams. Ask your health care provider how often you should have your eyes checked. Personal lifestyle choices, including: Daily care of your teeth and gums. Regular physical activity. Eating a healthy diet. Avoiding tobacco and drug use. Limiting alcohol use. Practicing safe sex. Taking low doses of aspirin every day. Taking vitamin and mineral supplements as recommended by your health care provider. What happens during an annual well check? The services and screenings done by your health care provider during your annual well check will depend on your age, overall health, lifestyle risk factors, and family history of disease. Counseling  Your health care provider may ask you questions about your: Alcohol use. Tobacco use. Drug use. Emotional well-being. Home and relationship well-being. Sexual activity. Eating habits. History of falls. Memory and ability to understand (cognition). Work and work  Statistician. Screening  You may have the following tests or measurements: Height, weight, and BMI. Blood pressure. Lipid and cholesterol levels. These may be checked every 5 years, or more frequently if you are over 37 years old. Skin check. Lung cancer screening. You may have this screening every year starting at age 49 if you have a 30-pack-year history of smoking and currently smoke or have quit within the past 15 years. Fecal occult blood test (FOBT) of the stool. You may have this test every year starting at age 32. Flexible sigmoidoscopy or colonoscopy. You may have a sigmoidoscopy every 5 years or a colonoscopy every 10 years starting at age 21. Prostate cancer screening. Recommendations will vary depending on your family history and other risks. Hepatitis C blood test. Hepatitis B blood test. Sexually transmitted disease (STD) testing. Diabetes screening. This is done by checking your blood sugar (glucose) after you have not eaten for a while (fasting). You may have this done every 1-3 years. Abdominal aortic aneurysm (AAA) screening. You may need this if you are a current or former smoker. Osteoporosis. You may be screened starting at age 12 if you are at high risk. Talk with your health care provider about your test results, treatment options, and if necessary, the need for more tests. Vaccines  Your health care provider may recommend certain vaccines, such as: Influenza vaccine. This is recommended every year. Tetanus, diphtheria, and acellular pertussis (Tdap, Td) vaccine. You may need a Td booster every 10 years. Zoster vaccine. You may need this after age 63. Pneumococcal 13-valent conjugate (PCV13) vaccine. One dose is recommended after age 91. Pneumococcal polysaccharide (PPSV23) vaccine. One dose is recommended after age 17. Talk to your health care provider about which screenings and vaccines you need and how  often you need them. This information is not intended to replace  advice given to you by your health care provider. Make sure you discuss any questions you have with your health care provider. Document Released: 01/13/2016 Document Revised: 09/05/2016 Document Reviewed: 10/18/2015 Elsevier Interactive Patient Education  2017 [place] Prevention in the Home Falls can cause injuries. They can happen to people of all ages. There are many things you can do to make your home safe and to help prevent falls. What can I do on the outside of my home? Regularly fix the edges of walkways and driveways and fix any cracks. Remove anything that might make you trip as you walk through a door, such as a raised step or threshold. Trim any bushes or trees on the path to your home. Use bright outdoor lighting. Clear any walking paths of anything that might make someone trip, such as rocks or tools. Regularly check to see if handrails are loose or broken. Make sure that both sides of any steps have handrails. Any raised decks and porches should have guardrails on the edges. Have any leaves, snow, or ice cleared regularly. Use sand or salt on walking paths during winter. Clean up any spills in your garage right away. This includes oil or grease spills. What can I do in the bathroom? Use night lights. Install grab bars by the toilet and in the tub and shower. Do not use towel bars as grab bars. Use non-skid mats or decals in the tub or shower. If you need to sit down in the shower, use a plastic, non-slip stool. Keep the floor dry. Clean up any water that spills on the floor as soon as it happens. Remove soap buildup in the tub or shower regularly. Attach bath mats securely with double-sided non-slip rug tape. Do not have throw rugs and other things on the floor that can make you trip. What can I do in the bedroom? Use night lights. Make sure that you have a light by your bed that is easy to reach. Do not use any sheets or blankets that are too big for your bed.  They should not hang down onto the floor. Have a firm chair that has side arms. You can use this for support while you get dressed. Do not have throw rugs and other things on the floor that can make you trip. What can I do in the kitchen? Clean up any spills right away. Avoid walking on wet floors. Keep items that you use a lot in easy-to-reach places. If you need to reach something above you, use a strong step stool that has a grab bar. Keep electrical cords out of the way. Do not use floor polish or wax that makes floors slippery. If you must use wax, use non-skid floor wax. Do not have throw rugs and other things on the floor that can make you trip. What can I do with my stairs? Do not leave any items on the stairs. Make sure that there are handrails on both sides of the stairs and use them. Fix handrails that are broken or loose. Make sure that handrails are as long as the stairways. Check any carpeting to make sure that it is firmly attached to the stairs. Fix any carpet that is loose or worn. Avoid having throw rugs at the top or bottom of the stairs. If you do have throw rugs, attach them to the floor with carpet tape. Make sure that you have a  light switch at the top of the stairs and the bottom of the stairs. If you do not have them, ask someone to add them for you. What else can I do to help prevent falls? Wear shoes that: Do not have high heels. Have rubber bottoms. Are comfortable and fit you well. Are closed at the toe. Do not wear sandals. If you use a stepladder: Make sure that it is fully opened. Do not climb a closed stepladder. Make sure that both sides of the stepladder are locked into place. Ask someone to hold it for you, if possible. Clearly mark and make sure that you can see: Any grab bars or handrails. First and last steps. Where the edge of each step is. Use tools that help you move around (mobility aids) if they are needed. These  include: Canes. Walkers. Scooters. Crutches. Turn on the lights when you go into a dark area. Replace any light bulbs as soon as they burn out. Set up your furniture so you have a clear path. Avoid moving your furniture around. If any of your floors are uneven, fix them. If there are any pets around you, be aware of where they are. Review your medicines with your doctor. Some medicines can make you feel dizzy. This can increase your chance of falling. Ask your doctor what other things that you can do to help prevent falls. This information is not intended to replace advice given to you by your health care provider. Make sure you discuss any questions you have with your health care provider. Document Released: 10/13/2009 Document Revised: 05/24/2016 Document Reviewed: 01/21/2015 Elsevier Interactive Patient Education  2017 [employer].

## 2022-10-01 ENCOUNTER — Other Ambulatory Visit: Payer: Self-pay | Admitting: Family Medicine

## 2022-10-02 NOTE — Telephone Encounter (Signed)
Requested medications are due for refill today.  yes  Requested medications are on the active medications list.  yes  Last refill. 08/07/2022 #30  Future visit scheduled.   yes  Notes to clinic.  Abnormal labs.    Requested Prescriptions  Pending Prescriptions Disp Refills   cetirizine (ZYRTEC) 5 MG tablet [Pharmacy Med Name: CETIRIZINE HCL 5 MG TABLET] 30 tablet 0    Sig: Take 1 tablet (5 mg total) by mouth daily.     Ear, Nose, and Throat:  Antihistamines 2 Failed - 10/01/2022 12:45 PM      Failed - Cr in normal range and within 360 days    Creatinine  Date Value Ref Range Status  05/03/2014 1.71 (H) 0.60 - 1.30 mg/dL Final   Creatinine, Ser  Date Value Ref Range Status  08/02/2022 2.11 (H) 0.61 - 1.24 mg/dL Final         Passed - Valid encounter within last 12 months    Recent Outpatient Visits           2 weeks ago Hypotension due to drugs   [place], Megan P, DO   1 month ago Benign hypertensive renal disease   [facility] [place], [place], DO   2 months ago Leg cramp   [place], [place], DO   3 months ago Other fatigue   [place], [place], DO   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

## 2022-10-05 ENCOUNTER — Inpatient Hospital Stay: Payer: Medicare Other | Attending: Internal Medicine

## 2022-10-05 DIAGNOSIS — C44310 Basal cell carcinoma of skin of unspecified parts of face: Secondary | ICD-10-CM | POA: Diagnosis present

## 2022-10-05 DIAGNOSIS — Z79899 Other long term (current) drug therapy: Secondary | ICD-10-CM | POA: Insufficient documentation

## 2022-10-05 DIAGNOSIS — I82512 Chronic embolism and thrombosis of left femoral vein: Secondary | ICD-10-CM | POA: Diagnosis present

## 2022-10-05 LAB — PROTIME-INR
INR: 3.1 — ABNORMAL HIGH (ref 0.8–1.2)
Prothrombin Time: 31.4 seconds — ABNORMAL HIGH (ref 11.4–15.2)

## 2022-10-11 ENCOUNTER — Other Ambulatory Visit: Payer: Self-pay | Admitting: Family Medicine

## 2022-10-11 NOTE — Telephone Encounter (Signed)
Requested Prescriptions  Pending Prescriptions Disp Refills  . donepezil (ARICEPT) 10 MG tablet [Pharmacy Med Name: DONEPEZIL HCL 10 MG TABLET] 90 tablet 0    Sig: Take 1 tablet (10 mg total) by mouth at bedtime.     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

## 2022-10-18 ENCOUNTER — Encounter: Payer: Self-pay | Admitting: Family Medicine

## 2022-10-18 ENCOUNTER — Ambulatory Visit: Payer: Medicare Other | Admitting: Family Medicine

## 2022-10-18 VITALS — BP 138/62 | HR 57 | Temp 97.6°F | Wt 165.0 lb

## 2022-10-18 DIAGNOSIS — E538 Deficiency of other specified B group vitamins: Secondary | ICD-10-CM

## 2022-10-18 DIAGNOSIS — I952 Hypotension due to drugs: Secondary | ICD-10-CM

## 2022-10-18 DIAGNOSIS — Z23 Encounter for immunization: Secondary | ICD-10-CM

## 2022-10-18 DIAGNOSIS — R2689 Other abnormalities of gait and mobility: Secondary | ICD-10-CM | POA: Diagnosis not present

## 2022-10-18 MED ORDER — DONEPEZIL HCL 10 MG PO TABS: 10.0000 mg | ORAL_TABLET | Freq: Every day | ORAL | 1 refills | Status: DC

## 2022-10-18 MED ORDER — GABAPENTIN 100 MG PO CAPS: 200.0000 mg | ORAL_CAPSULE | Freq: Every day | ORAL | 1 refills | Status: DC

## 2022-10-18 NOTE — Progress Notes (Signed)
BP 138/62   Pulse (!) 57   Temp 97.6 F (36.4 C)   Wt 165 lb (74.8 kg)   SpO2 96%   BMI 25.84 kg/m    Subjective:    Patient ID: Gregg Thomas, male    DOB: 24-Apr-1931, 88 y.o.   MRN: [id number]  HPI: Gregg Thomas is a 88 y.o. male  Chief Complaint  Patient presents with   Hypotension    Patient here to follow up on low BP, stopped medication at last visit.    Balanace problem    Patient states PT has been coming out for the past 3 weeks and PT has been helping.    HYPERTENSION  Hypertension status: better  Satisfied with current treatment? yes Duration of hypertension: chronic BP monitoring frequency:  a few times a week BP medication side effects:  no Medication compliance: not on anything anymore Aspirin: no Recurrent headaches: no Visual changes: no Palpitations: no Dyspnea: no Chest pain: no Lower extremity edema: no Dizzy/lightheaded: no  PT is going great. Feeling much better. Balance has greatly improved.   Relevant past medical, surgical, family and social history reviewed and updated as indicated. Interim medical history since our last visit reviewed. Allergies and medications reviewed and updated.  Review of Systems  Constitutional: Negative.   Respiratory: Negative.    Cardiovascular: Negative.   Gastrointestinal: Negative.   Musculoskeletal: Negative.   Neurological: Negative.   Psychiatric/Behavioral: Negative.      Per HPI unless specifically indicated above     Objective:    BP 138/62   Pulse (!) 57   Temp 97.6 F (36.4 C)   Wt 165 lb (74.8 kg)   SpO2 96%   BMI 25.84 kg/m   Wt Readings from Last 3 Encounters:  10/18/22 165 lb (74.8 kg)  09/18/22 156 lb 6.4 oz (70.9 kg)  09/05/22 161 lb 6.4 oz (73.2 kg)    Physical Exam Vitals and nursing note reviewed.  Constitutional:      General: He is not in acute distress.    Appearance: Normal appearance. He is normal weight. He is not ill-appearing,  toxic-appearing or diaphoretic.  HENT:     Head: Normocephalic and atraumatic.     Right Ear: External ear normal.     Left Ear: External ear normal.     Nose: Nose normal.     Mouth/Throat:     Mouth: Mucous membranes are moist.     Pharynx: Oropharynx is clear.  Eyes:     General: No scleral icterus.       Right eye: No discharge.        Left eye: No discharge.     Extraocular Movements: Extraocular movements intact.     Conjunctiva/sclera: Conjunctivae normal.     Pupils: Pupils are equal, round, and reactive to light.  Cardiovascular:     Rate and Rhythm: Normal rate and regular rhythm.     Pulses: Normal pulses.     Heart sounds: Normal heart sounds. No murmur heard.    No friction rub. No gallop.  Pulmonary:     Effort: Pulmonary effort is normal. No respiratory distress.     Breath sounds: Normal breath sounds. No stridor. No wheezing, rhonchi or rales.  Chest:     Chest wall: No tenderness.  Musculoskeletal:        General: Normal range of motion.     Cervical back: Normal range of motion and neck supple.  Skin:  General: Skin is warm and dry.     Capillary Refill: Capillary refill takes less than 2 seconds.     Coloration: Skin is not jaundiced or pale.     Findings: No bruising, erythema, lesion or rash.  Neurological:     General: No focal deficit present.     Mental Status: He is alert and oriented to person, place, and time. Mental status is at baseline.  Psychiatric:        Mood and Affect: Mood normal.        Behavior: Behavior normal.        Thought Content: Thought content normal.        Judgment: Judgment normal.     Results for orders placed or performed in visit on 10/05/22  Protime-INR  Result Value Ref Range   Prothrombin Time 31.4 (H) 11.4 - 15.2 seconds   INR 3.1 (H) 0.8 - 1.2      Assessment & Plan:   Problem List Items Addressed This Visit   None Visit Diagnoses     Balance problem    -  Primary   Doing much better. Continue PT.  continue to monitor. Call with any concerns.    Hypotension due to drugs       Resolved. BP good off meds. Continue to monitor. Call with any concerns.    Need for influenza vaccination       Flu shot given today.   Relevant Orders   Flu Vaccine QUAD High Dose(Fluad) (Completed)        Follow up plan: Return in about 4 months (around 02/18/2023).

## 2022-10-23 ENCOUNTER — Other Ambulatory Visit: Payer: Self-pay | Admitting: Internal Medicine

## 2022-10-23 NOTE — Telephone Encounter (Signed)
Message from 09/05/22 INR results:  Please inform patient/wife that INR is slightly high.  Would recommend going back to Coumadin 5 mg once a day.  Continue blood checks once a month

## 2022-10-29 ENCOUNTER — Other Ambulatory Visit: Payer: Self-pay | Admitting: Family Medicine

## 2022-10-30 NOTE — Telephone Encounter (Signed)
Requested Prescriptions  Pending Prescriptions Disp Refills  . cetirizine (ZYRTEC) 5 MG tablet [Pharmacy Med Name: CETIRIZINE HCL 5 MG TABLET] 90 tablet 0    Sig: Take 1 tablet (5 mg total) by mouth daily.     Ear, Nose, and Throat:  Antihistamines 2 Failed - 10/29/2022 12:54 PM      Failed - Cr in normal range and within 360 days    Creatinine  Date Value Ref Range Status  05/03/2014 1.71 (H) 0.60 - 1.30 mg/dL Final   Creatinine, Ser  Date Value Ref Range Status  08/02/2022 2.11 (H) 0.61 - 1.24 mg/dL Final         Passed - Valid encounter within last 12 months    Recent Outpatient Visits          1 week ago Balance problem   [place], Megan P, DO   1 month ago Hypotension due to drugs   [place], [place], DO   2 months ago Benign hypertensive renal disease   [facility] [place], [place], DO   3 months ago Leg cramp   [place], [place], DO   4 months ago Other fatigue   [place], [place], DO      Future Appointments            In 3 months [place], Barb Merino, DO [facility]

## 2022-11-05 ENCOUNTER — Inpatient Hospital Stay: Payer: Medicare Other | Attending: Internal Medicine

## 2022-11-05 DIAGNOSIS — I82512 Chronic embolism and thrombosis of left femoral vein: Secondary | ICD-10-CM | POA: Insufficient documentation

## 2022-11-05 DIAGNOSIS — C44310 Basal cell carcinoma of skin of unspecified parts of face: Secondary | ICD-10-CM | POA: Diagnosis present

## 2022-11-05 LAB — PROTIME-INR
INR: 3.8 — ABNORMAL HIGH (ref 0.8–1.2)
Prothrombin Time: 37.3 seconds — ABNORMAL HIGH (ref 11.4–15.2)

## 2022-11-07 ENCOUNTER — Telehealth: Payer: Self-pay | Admitting: *Deleted

## 2022-11-07 MED ORDER — WARFARIN SODIUM 3 MG PO TABS: 3.0000 mg | ORAL_TABLET | Freq: Every day | ORAL | 0 refills | Status: DC

## 2022-11-07 NOTE — Telephone Encounter (Signed)
Call placed to patient/wife on 11/7 to follow up on INR results, recommendations for coumadin dosing. No answer. Left vm. No return call.  Patients wife returned call to cancer center this morning. Nurse reviewed the following recommendations with patients wife:   Current: 25 mg per week ('5mg'$  daily)   Change: Hold today's dose (hold tomorrow instead if they have already taken their medication today)  Then reduce your next dose to 3 mg.   Lab recheck in 3-7 days   Patients wife advised to hold today's dose of coumadin. Patient to take 3 mg tomorrow and resume 5 mg on Friday 11/10. Patients wife verbalized understanding. Patient does need prescription of 1 mg or 3 mg tablets sent in as she only has 5 mg tablets on hand. Nurse spoke with Nelwyn Salisbury, PA and will send prescription for patient. Patient to come in for repeat lab draw on Monday 11/13. Will send message to scheduling.

## 2022-11-12 ENCOUNTER — Inpatient Hospital Stay: Payer: Medicare Other

## 2022-11-12 DIAGNOSIS — C44310 Basal cell carcinoma of skin of unspecified parts of face: Secondary | ICD-10-CM | POA: Diagnosis not present

## 2022-11-12 DIAGNOSIS — I82512 Chronic embolism and thrombosis of left femoral vein: Secondary | ICD-10-CM

## 2022-11-12 LAB — PROTIME-INR
INR: 2.5 — ABNORMAL HIGH (ref 0.8–1.2)
Prothrombin Time: 26.6 seconds — ABNORMAL HIGH (ref 11.4–15.2)

## 2022-11-20 ENCOUNTER — Telehealth: Payer: Self-pay

## 2022-11-20 NOTE — Telephone Encounter (Signed)
Called and left detailed message informing patient to continue current regimen for coumadin. Advised to call with any questions or concerns.

## 2022-11-20 NOTE — Telephone Encounter (Signed)
-----   Message from Verlon Au, NP sent at 11/20/2022  4:13 PM EST ----- INR is therapeutic. Continue current regimen.   ----- Message ----- From: Johney Maine, RN Sent: 11/13/2022   8:49 AM EST To: Verlon Au, NP; Rhoderick Moody, CMA  Sarah made adjustments to coumadin last week, please advise of any needed dosing changes. Thanks!  ----- Message ----- From: Buel Ream, Lab In [place] Sent: 11/12/2022   2:00 PM EST To: Cammie Sickle, MD

## 2022-11-28 ENCOUNTER — Ambulatory Visit: Payer: Self-pay

## 2022-11-28 ENCOUNTER — Ambulatory Visit: Payer: Self-pay | Admitting: *Deleted

## 2022-11-28 NOTE — Telephone Encounter (Signed)
Gregg Thomas,PT calling to report elevated BP- unable to do PT with BP this high- no symptoms Chief Complaint: high BP Symptoms: none Frequency: reports BP has been climbing over the last week Pertinent Negatives: Patient denies symptoms Disposition: '[]'$ ED /'[]'$ Urgent Care (no appt availability in office) / '[x]'$ Appointment(In office/virtual)/ '[]'$  [place] [facility]/ '[]'$ Home Care/ '[]'$ Refused Recommended Disposition /'[]'$ [facility] [facility]/ '[]'$  Follow-up with PCP Additional Notes:

## 2022-11-28 NOTE — Telephone Encounter (Signed)
PT was called at some point this morning and the appointment was already set up when I called him.

## 2022-11-28 NOTE — Telephone Encounter (Signed)
Patient should be seen for a visit.

## 2022-11-28 NOTE — Telephone Encounter (Signed)
  Chief Complaint: BP 186/72  yesterday: 179/60, 185/70 Symptoms: denies sx Frequency: yesterday Pertinent Negatives: Patient denies blurred vision, chest pain, difficulty breathing, headache, weakness Disposition: '[]'$ ED /'[]'$ Urgent Care (no appt availability in office) / '[x]'$ Appointment(In office/virtual)/ '[]'$  [place] [facility]/ '[]'$ Home Care/ '[]'$ Refused Recommended Disposition /'[]'$ [place] [facility]/ '[]'$  Follow-up with PCP Additional Notes: appt made- offered appt for tomorrow and Friday and refused. Pt's wife stated his BP med was dc'd. Pt's wife stated they can restart if PCP approves. Reason for Disposition  Systolic BP  >= 297 OR Diastolic >= 989  Answer Assessment - Initial Assessment Questions 1. BLOOD PRESSURE: "What is the blood pressure?" "Did you take at least two measurements 5 minutes apart?"     186/92      179/60  185/70 2. ONSET: "When did you take your blood pressure?"     This am  3. HOW: "How did you take your blood pressure?" (e.g., automatic home BP monitor, visiting nurse)     Auto BP machine- wrist  4. HISTORY: "Do you have a history of high blood pressure?"     yes 5. MEDICINES: "Are you taking any medicines for blood pressure?" "Have you missed any doses recently?"     PCP took pt off BP med 6. OTHER SYMPTOMS: "Do you have any symptoms?" (e.g., blurred vision, chest pain, difficulty breathing, headache, weakness)     no 7. PREGNANCY: "Is there any chance you are pregnant?" "When was your last menstrual period?"     N/a  Protocols used: Blood Pressure - High-A-AH

## 2022-11-28 NOTE — Telephone Encounter (Signed)
Reason for Disposition  Systolic BP  >= 202 OR Diastolic >= 334  Answer Assessment - Initial Assessment Questions 1. BLOOD PRESSURE: "What is the blood pressure?" "Did you take at least two measurements 5 minutes apart?"     184/92, 190/93 P 53 2. ONSET: "When did you take your blood pressure?"     4:00 3. HOW: "How did you take your blood pressure?" (e.g., automatic home BP monitor, visiting nurse)     automatic 4. HISTORY: "Do you have a history of high blood pressure?"     yes 5. MEDICINES: "Are you taking any medicines for blood pressure?" "Have you missed any doses recently?"     Off medications since September- last week 170/80- creeping back up 6. OTHER SYMPTOMS: "Do you have any symptoms?" (e.g., blurred vision, chest pain, difficulty breathing, headache, weakness)     No symptoms  Protocols used: Blood Pressure - High-A-AH

## 2022-11-29 ENCOUNTER — Ambulatory Visit: Payer: Medicare Other | Admitting: Physician Assistant

## 2022-11-29 ENCOUNTER — Encounter: Payer: Self-pay | Admitting: Physician Assistant

## 2022-11-29 VITALS — BP 136/69 | HR 64 | Temp 97.7°F | Wt 165.0 lb

## 2022-11-29 DIAGNOSIS — I10 Essential (primary) hypertension: Secondary | ICD-10-CM | POA: Insufficient documentation

## 2022-11-29 NOTE — Patient Instructions (Addendum)
I would like you to try taking Amlodipine 2.5 mg once per day  You can take a half tablet of the 5 mg Amlodipine until they run out and then pick up the script I have sent in when you are ready.   Please come back and see your PCP in 4 weeks to see how you are doing  If you notice low BP readings, dizziness, loss of balance- please come back and see [place] sooner  Please watch your salt intake and make sure you are drinking plenty of water

## 2022-11-29 NOTE — Assessment & Plan Note (Signed)
Ongoing concern Review of BP logs at home reveals BP readings in elevated ranges through Nov (in 150s-190s/80s-90s)  BP in office is mildly elevated both with arm cuff and wrist meter Reviewed previous notes and patient has had difficulty with hypotension when placed on BP meds  Recommend trying Amlodipine 2.5 mg PO QD to assist with control and hopefully prevent hypotensive symptoms Recommend he continue to check BP at home daily to assess response Recommend follow up in 4 weeks to assess response to regimen changes or sooner if concerns arise.

## 2022-11-29 NOTE — Progress Notes (Signed)
Established Patient Office Visit  Name: Gregg Thomas   MRN: [id number]    DOB: [birth year]   Date:11/29/2022  Today's Provider: Talitha Givens, MHS, PA-C Introduced myself to the patient as a PA-C and provided education on APPs in clinical practice.         Subjective  Chief Complaint  Chief Complaint  Patient presents with   Hypertension    Patient states his BP has been running high for the past few days with the systolic number being 935'T-017'B. Patient has BP log today with him.     HPI    Reports he was getting high BP readings yesterday  Denies chest pain, vision changes, headaches, or dizziness yesterday Reviewed home BP measures - shows steady increase in BP readings through month of Nov with results ranging from 150s- 180s/ 80s-90s  States PT came to the house yesterday and checked BP with arm cuff and BP was 190/ 93, HR of 53   Checked BP with wrist monitor in office 137/67  His wife reports he did not have troubling side effect with Amlodipine      Patient Active Problem List   Diagnosis Date Noted   Primary hypertension 11/29/2022   B12 deficiency 05/15/2022   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   Pneumonia due to COVID-19 virus 10/31/2021   Gastroenteritis due to COVID-19 virus 10/31/2021   Senile purpura ([place]) 10/28/2021   Acquired thrombophilia ([place]) 06/20/2020   Memory loss 06/20/2020   Chronic deep vein thrombosis (DVT) of femoral vein of left lower extremity ([place]) 11/23/2019   Benign hypertensive renal disease 02/03/2019   Advanced care planning/counseling discussion 02/03/2019   Hearing loss 02/03/2019   Squamous cell carcinoma, face 10/22/2018   CKD (chronic kidney disease) stage 4, GFR 15-29 ml/min (HCC) 10/22/2018   Chronic anticoagulation 08/22/2017   DVT (deep venous thrombosis) ([place]) 07/08/2015    Past Surgical History:  Procedure Laterality Date   biopsy Right 08/26/2019   temple biopsy - dr.dasher    EYE SURGERY  Right    benign growth on right eye    KNEE SURGERY Right    MELANOMA EXCISION Bilateral    lower back     Family History  Problem Relation Age of Onset   Stroke Mother    Cerebral aneurysm Father     Social History   Tobacco Use   Smoking status: Never   Smokeless tobacco: Never  Substance Use Topics   Alcohol use: No     Current Outpatient Medications:    ascorbic acid (VITAMIN C) 1000 MG tablet, Take by mouth., Disp: , Rfl:    cetirizine (ZYRTEC) 5 MG tablet, Take 1 tablet (5 mg total) by mouth daily., Disp: 90 tablet, Rfl: 0   Cholecalciferol (VITAMIN D3 PO), Take 1 tablet by mouth daily., Disp: , Rfl:    Cyanocobalamin (VITAMIN B-12 ER PO), Take 1,000 mcg by mouth daily at 6 (six) AM., Disp: , Rfl:    donepezil (ARICEPT) 10 MG tablet, Take 1 tablet (10 mg total) by mouth at bedtime., Disp: 90 tablet, Rfl: 1   ELDERBERRY PO, Take 1 tablet by mouth daily., Disp: , Rfl:    ERIVEDGE 150 MG capsule, Take 150 mg by mouth daily., Disp: , Rfl:    fluticasone (FLONASE) 50 MCG/ACT nasal spray, Place 2 sprays into both nostrils daily., Disp: 16 g, Rfl: 6   gabapentin (NEURONTIN) 100 MG capsule, Take 2 capsules (200 mg total) by mouth  at bedtime., Disp: 180 capsule, Rfl: 1   Grape Seed Extract 50 MG CAPS, Take 1,300 mg by mouth daily. , Disp: , Rfl:    Ipratropium-Albuterol (COMBIVENT) 20-100 MCG/ACT AERS respimat, Inhale 1 puff into the lungs every 6 (six) hours., Disp: , Rfl:    montelukast (SINGULAIR) 10 MG tablet, Take 1 tablet (10 mg total) by mouth at bedtime., Disp: 90 tablet, Rfl: 1   warfarin (COUMADIN) 2 MG tablet, On Saturday and Sunday-take 1 pill a day [along with 5 mg-a total of 7 mg]., Disp: 30 tablet, Rfl: 1   warfarin (COUMADIN) 3 MG tablet, Take 1 tablet (3 mg total) by mouth daily. As directed on Thursday 11/9., Disp: 5 tablet, Rfl: 0   warfarin (COUMADIN) 5 MG tablet, Take 1 tablet (5 mg total) by mouth one time only at 6 PM., Disp: 90 tablet, Rfl: 0   zinc  gluconate 50 MG tablet, Take by mouth., Disp: , Rfl:   Current Facility-Administered Medications:    cyanocobalamin ((VITAMIN B-12)) injection 1,000 mcg, 1,000 mcg, Intramuscular, Q30 days, Johnson, Megan P, DO, 1,000 mcg at 10/18/22 1414  Allergies  Allergen Reactions   Amlodipine Other (See Comments)    Edema     I personally reviewed active problem list, medication list, allergies, notes from last encounter with the patient/caregiver today.   Review of Systems  Eyes:  Negative for blurred vision and double vision.  Respiratory:  Negative for shortness of breath and wheezing.   Cardiovascular:  Negative for chest pain, palpitations and leg swelling.  Musculoskeletal:  Negative for falls.  Neurological:  Negative for dizziness, tingling, tremors, loss of consciousness and headaches.      Objective  Vitals:   11/29/22 1047  BP: 136/69  Pulse: 64  Temp: 97.7 F (36.5 C)  SpO2: 95%  Weight: 165 lb (74.8 kg)    Body mass index is 25.84 kg/m.  Physical Exam Vitals reviewed.  Constitutional:      General: He is awake.     Appearance: Normal appearance. He is well-developed and well-groomed.  HENT:     Head: Normocephalic and atraumatic.  Cardiovascular:     Rate and Rhythm: Normal rate and regular rhythm.     Pulses: Normal pulses.     Heart sounds: Normal heart sounds. No murmur heard.    No friction rub. No gallop.  Pulmonary:     Effort: Pulmonary effort is normal.     Breath sounds: No decreased air movement. No decreased breath sounds, wheezing, rhonchi or rales.  Musculoskeletal:     Right lower leg: No edema.     Left lower leg: No edema.  Neurological:     Mental Status: He is alert.  Psychiatric:        Attention and Perception: Attention and perception normal.        Mood and Affect: Mood and affect normal.        Speech: Speech normal.        Behavior: Behavior normal. Behavior is cooperative.      Recent Results (from the past 2160 hour(s))   Protime-INR     Status: Abnormal   Collection Time: 09/05/22  1:53 PM  Result Value Ref Range   Prothrombin Time 36.0 (H) 11.4 - 15.2 seconds   INR 3.6 (H) 0.8 - 1.2    Comment: (NOTE) INR goal varies based on device and disease states. Performed at [facility], [address] [place], [place] [address]   [place]  Status: Abnormal   Collection Time: 10/05/22  1:13 PM  Result Value Ref Range   Prothrombin Time 31.4 (H) 11.4 - 15.2 seconds   INR 3.1 (H) 0.8 - 1.2    Comment: (NOTE) INR goal varies based on device and disease states. Performed at [facility], [address] [place], [place] [address]   Protime-INR     Status: Abnormal   Collection Time: 11/05/22  1:46 PM  Result Value Ref Range   Prothrombin Time 37.3 (H) 11.4 - 15.2 seconds   INR 3.8 (H) 0.8 - 1.2    Comment: (NOTE) INR goal varies based on device and disease states. Performed at [facility], [address] [place], [place] [address]   Protime-INR     Status: Abnormal   Collection Time: 11/12/22  1:33 PM  Result Value Ref Range   Prothrombin Time 26.6 (H) 11.4 - 15.2 seconds   INR 2.5 (H) 0.8 - 1.2    Comment: (NOTE) INR goal varies based on device and disease states. Performed at [facility], [address] [place], [place] [address]      PHQ2/9:    10/18/2022    2:13 PM 09/24/2022   11:12 AM 09/18/2022    2:24 PM 08/14/2022    1:16 PM 06/12/2022    1:50 PM  Depression screen PHQ 2/9  Decreased Interest 0 '1 2 2 1  '$ Down, Depressed, Hopeless 0 '1 2 1 '$ 0  PHQ - 2 Score 0 '2 4 3 1  '$ Altered sleeping '1 2 2 2 1  '$ Tired, decreased energy '1 1 3 2 2  '$ Change in appetite 0 0 2 1 0  Feeling bad or failure about yourself  0  1 0 0  Trouble concentrating '1 1 2 1 1  '$ Moving slowly or fidgety/restless 0 1 3 0 1  Suicidal thoughts 0 0 1 0 0  PHQ-9 Score '3 7 18 9 6  '$ Difficult doing work/chores Not difficult at all Somewhat difficult Somewhat difficult Somewhat  difficult Somewhat difficult      Fall Risk:    10/18/2022    2:13 PM 09/24/2022   11:06 AM 09/18/2022    2:24 PM 02/08/2022    2:15 PM 09/22/2021    1:47 PM  Fall Risk   Falls in the past year? '1 1 1 '$ 0 0  Number falls in past yr: 1 0 1 0   Injury with Fall? 0 0 0 0   Risk for fall due to : History of fall(s)  History of fall(s) Impaired mobility;History of fall(s) Medication side effect  Follow up Falls evaluation completed Falls evaluation completed;Falls prevention discussed;Education provided Falls evaluation completed Falls evaluation completed Falls evaluation completed;Education provided;Falls prevention discussed      Functional Status Survey:      Assessment & Plan  Problem List Items Addressed This Visit       Cardiovascular and Mediastinum   Primary hypertension - Primary    Ongoing concern Review of BP logs at home reveals BP readings in elevated ranges through Nov (in 150s-190s/80s-90s)  BP in office is mildly elevated both with arm cuff and wrist meter Reviewed previous notes and patient has had difficulty with hypotension when placed on BP meds  Recommend trying Amlodipine 2.5 mg PO QD to assist with control and hopefully prevent hypotensive symptoms Recommend he continue to check BP at home daily to assess response Recommend follow up in 4 weeks to assess response to regimen changes or  sooner if concerns arise.         Return in about 4 weeks (around 12/27/2022) for elevated BP- restarted amlodipine at 2.'5mg'$  .   I, Dhaval Woo E Pasqualina Colasurdo, PA-C, have reviewed all documentation for this visit. The documentation on 11/29/22 for the exam, diagnosis, procedures, and orders are all accurate and complete.   Talitha Givens, MHS, PA-C [facility]

## 2022-12-03 ENCOUNTER — Ambulatory Visit: Payer: Medicare Other | Admitting: Family Medicine

## 2022-12-03 ENCOUNTER — Telehealth: Payer: Self-pay | Admitting: Nurse Practitioner

## 2022-12-04 NOTE — Telephone Encounter (Signed)
Requested medication (s) are due for refill today: routing for review  Requested medication (s) are on the active medication list: no  Last refill:  unknown  Future visit scheduled: yes  Notes to clinic:  Unable to refill per protocol,Medication is not on current list, routing for review.     Requested Prescriptions  Pending Prescriptions Disp Refills   enalapril (VASOTEC) 20 MG tablet [Pharmacy Med Name: ENALAPRIL MALEATE 20 MG TAB] 180 tablet 0    Sig: Take 1 tablet (20 mg total) by mouth 2 (two) times daily.     Cardiovascular:  ACE Inhibitors Failed - 12/03/2022  1:28 PM      Failed - Cr in normal range and within 180 days    Creatinine  Date Value Ref Range Status  05/03/2014 1.71 (H) 0.60 - 1.30 mg/dL Final   Creatinine, Ser  Date Value Ref Range Status  08/02/2022 2.11 (H) 0.61 - 1.24 mg/dL Final         Passed - K in normal range and within 180 days    Potassium  Date Value Ref Range Status  08/02/2022 4.1 3.5 - 5.1 mmol/L Final  05/03/2014 4.2 3.5 - 5.1 mmol/L Final         Passed - Patient is not pregnant      Passed - Last BP in normal range    BP Readings from Last 1 Encounters:  11/29/22 136/69         Passed - Valid encounter within last 6 months    Recent Outpatient Visits           5 days ago Primary hypertension   [facility] Mecum, Dani Gobble, PA-C   1 month ago Balance problem   [place], [place], DO   2 months ago Hypotension due to drugs   [employer], [place], DO   3 months ago Benign hypertensive renal disease   [facility] [place], [place], DO   5 months ago Leg cramp   [place], [place], DO       Future Appointments             In 3 weeks Wynetta Emery, Barb Merino, DO [employer], [place]   In 2 months Wynetta Emery, Barb Merino, DO [employer], [facility]

## 2022-12-04 NOTE — Telephone Encounter (Signed)
Pts spouse called and stated to disregard the refill request due to pt no longer taking Enalapril /

## 2022-12-05 ENCOUNTER — Inpatient Hospital Stay: Payer: Medicare Other | Attending: Internal Medicine

## 2022-12-05 DIAGNOSIS — I82512 Chronic embolism and thrombosis of left femoral vein: Secondary | ICD-10-CM | POA: Insufficient documentation

## 2022-12-05 DIAGNOSIS — C44310 Basal cell carcinoma of skin of unspecified parts of face: Secondary | ICD-10-CM | POA: Diagnosis not present

## 2022-12-05 LAB — PROTIME-INR
INR: 3.3 — ABNORMAL HIGH (ref 0.8–1.2)
Prothrombin Time: 33 seconds — ABNORMAL HIGH (ref 11.4–15.2)

## 2022-12-06 ENCOUNTER — Telehealth: Payer: Self-pay

## 2022-12-06 NOTE — Telephone Encounter (Signed)
Left message informing patient to continue current coumadin dose.

## 2022-12-06 NOTE — Telephone Encounter (Signed)
-----   Message from Cammie Sickle, MD sent at 12/06/2022  8:22 AM EST ----- Regarding: FW: Please inform patient to continue current dose of Coumadin/continue PT/INR checks. ----- Message ----- From: Buel Ream, Lab In [facility] Sent: 12/05/2022   2:03 PM EST To: Cammie Sickle, MD

## 2022-12-27 ENCOUNTER — Telehealth: Payer: Self-pay | Admitting: Family Medicine

## 2022-12-27 ENCOUNTER — Ambulatory Visit: Payer: Medicare Other | Admitting: Family Medicine

## 2022-12-27 ENCOUNTER — Encounter: Payer: Self-pay | Admitting: Family Medicine

## 2022-12-27 VITALS — BP 131/72 | HR 58 | Temp 97.6°F | Ht 67.0 in | Wt 161.2 lb

## 2022-12-27 DIAGNOSIS — E538 Deficiency of other specified B group vitamins: Secondary | ICD-10-CM | POA: Diagnosis not present

## 2022-12-27 DIAGNOSIS — E2089 Other specified hypoparathyroidism: Secondary | ICD-10-CM

## 2022-12-27 DIAGNOSIS — R2689 Other abnormalities of gait and mobility: Secondary | ICD-10-CM | POA: Diagnosis not present

## 2022-12-27 DIAGNOSIS — I10 Essential (primary) hypertension: Secondary | ICD-10-CM | POA: Diagnosis not present

## 2022-12-27 MED ORDER — AMLODIPINE BESYLATE 2.5 MG PO TABS: 2.5000 mg | ORAL_TABLET | Freq: Every day | ORAL | 1 refills | Status: DC

## 2022-12-27 NOTE — Assessment & Plan Note (Signed)
Under good control on current regimen. Continue current regimen. Continue to monitor. Call with any concerns. Refills given. Labs drawn today.   

## 2022-12-27 NOTE — Telephone Encounter (Signed)
This was the home health he was using previously. I don't think it's in [place]

## 2022-12-27 NOTE — Telephone Encounter (Signed)
Copied from [place] [phone] [phone]. Topic: General - Other >> Dec 27, 2022  1:40 PM Charlotte Sanes J wrote: Reason for CRM: [facility] in [place] doesn't see pts in [place] / please advise

## 2022-12-27 NOTE — Progress Notes (Signed)
BP 131/72   Pulse (!) 58   Temp 97.6 F (36.4 C) (Oral)   Ht '5\' 7"'$  (1.702 m)   Wt 161 lb 3.2 oz (73.1 kg)   SpO2 93%   BMI 25.25 kg/m    Subjective:    Patient ID: Gregg Thomas, male    DOB: [birth year], 89 y.o.   MRN: [id number]  HPI: Gregg Thomas is a 88 y.o. male  Chief Complaint  Patient presents with   Hypertension   HYPERTENSION  Hypertension status: better  Satisfied with current treatment? yes Duration of hypertension: chronic BP monitoring frequency:  a few times a week BP medication side effects:  no Medication compliance: excellent compliance Previous BP meds: amlodipine Aspirin: no Recurrent headaches: no Visual changes: no Palpitations: no Dyspnea: no Chest pain: no Lower extremity edema: no Dizzy/lightheaded: no  Has been doing really well with PT. Balance has improved. PT recommends continuing for now.   Relevant past medical, surgical, family and social history reviewed and updated as indicated. Interim medical history since our last visit reviewed. Allergies and medications reviewed and updated.  Review of Systems  Constitutional: Negative.   Respiratory: Negative.    Cardiovascular: Negative.   Gastrointestinal: Negative.   Musculoskeletal: Negative.   Neurological: Negative.   Psychiatric/Behavioral: Negative.      Per HPI unless specifically indicated above     Objective:    BP 131/72   Pulse (!) 58   Temp 97.6 F (36.4 C) (Oral)   Ht '5\' 7"'$  (1.702 m)   Wt 161 lb 3.2 oz (73.1 kg)   SpO2 93%   BMI 25.25 kg/m   Wt Readings from Last 3 Encounters:  12/27/22 161 lb 3.2 oz (73.1 kg)  11/29/22 165 lb (74.8 kg)  10/18/22 165 lb (74.8 kg)    Physical Exam Vitals and nursing note reviewed.  Constitutional:      General: He is not in acute distress.    Appearance: Normal appearance. He is normal weight. He is not ill-appearing, toxic-appearing or diaphoretic.  HENT:     Head: Normocephalic and atraumatic.      Right Ear: External ear normal.     Left Ear: External ear normal.     Nose: Nose normal.     Mouth/Throat:     Mouth: Mucous membranes are moist.     Pharynx: Oropharynx is clear.  Eyes:     General: No scleral icterus.       Right eye: No discharge.        Left eye: No discharge.     Extraocular Movements: Extraocular movements intact.     Conjunctiva/sclera: Conjunctivae normal.     Pupils: Pupils are equal, round, and reactive to light.  Cardiovascular:     Rate and Rhythm: Normal rate and regular rhythm.     Pulses: Normal pulses.     Heart sounds: Normal heart sounds. No murmur heard.    No friction rub. No gallop.  Pulmonary:     Effort: Pulmonary effort is normal. No respiratory distress.     Breath sounds: Normal breath sounds. No stridor. No wheezing, rhonchi or rales.  Chest:     Chest wall: No tenderness.  Musculoskeletal:        General: Normal range of motion.     Cervical back: Normal range of motion and neck supple.  Skin:    General: Skin is warm and dry.     Capillary Refill: Capillary refill takes less than 2  seconds.     Coloration: Skin is not jaundiced or pale.     Findings: No bruising, erythema, lesion or rash.  Neurological:     General: No focal deficit present.     Mental Status: He is alert and oriented to person, place, and time. Mental status is at baseline.  Psychiatric:        Mood and Affect: Mood normal.        Behavior: Behavior normal.        Thought Content: Thought content normal.        Judgment: Judgment normal.     Results for orders placed or performed in visit on 12/05/22  Protime-INR  Result Value Ref Range   Prothrombin Time 33.0 (H) 11.4 - 15.2 seconds   INR 3.3 (H) 0.8 - 1.2      Assessment & Plan:   Problem List Items Addressed This Visit       Cardiovascular and Mediastinum   Primary hypertension - Primary    Under good control on current regimen. Continue current regimen. Continue to monitor. Call with any  concerns. Refills given. Labs drawn today.       Relevant Medications   amLODipine (NORVASC) 2.5 MG tablet   Other Relevant Orders   Basic metabolic panel   Other Visit Diagnoses     Balance problem       Has been doing much better with PT. Will continue. New referral placed today.   Relevant Orders   Ambulatory referral to Home Health        Follow up plan: Return for as scheduled with me, 1 month nurse only b12 shot.

## 2022-12-28 LAB — BASIC METABOLIC PANEL
BUN/Creatinine Ratio: 11 (ref 10–24)
BUN: 20 mg/dL (ref 8–27)
CO2: 22 mmol/L (ref 20–29)
Calcium: 9.9 mg/dL (ref 8.6–10.2)
Chloride: 111 mmol/L — ABNORMAL HIGH (ref 96–106)
Creatinine, Ser: 1.87 mg/dL — ABNORMAL HIGH (ref 0.76–1.27)
Glucose: 88 mg/dL (ref 70–99)
Potassium: 4.4 mmol/L (ref 3.5–5.2)
Sodium: 149 mmol/L — ABNORMAL HIGH (ref 134–144)
eGFR: 34 mL/min/{1.73_m2} — ABNORMAL LOW (ref >59)

## 2023-01-01 ENCOUNTER — Encounter: Payer: Self-pay | Admitting: Family Medicine

## 2023-01-01 DIAGNOSIS — E2089 Other specified hypoparathyroidism: Secondary | ICD-10-CM | POA: Insufficient documentation

## 2023-01-07 ENCOUNTER — Inpatient Hospital Stay: Payer: Medicare Other | Attending: Internal Medicine

## 2023-01-07 DIAGNOSIS — C44310 Basal cell carcinoma of skin of unspecified parts of face: Secondary | ICD-10-CM | POA: Diagnosis present

## 2023-01-07 DIAGNOSIS — I82512 Chronic embolism and thrombosis of left femoral vein: Secondary | ICD-10-CM | POA: Diagnosis present

## 2023-01-07 LAB — PROTIME-INR
INR: 2.7 — ABNORMAL HIGH (ref 0.8–1.2)
Prothrombin Time: 28.4 seconds — ABNORMAL HIGH (ref 11.4–15.2)

## 2023-01-21 ENCOUNTER — Other Ambulatory Visit: Payer: Self-pay | Admitting: Internal Medicine

## 2023-01-21 NOTE — Telephone Encounter (Signed)
Component Ref Range & Units 2 wk ago (01/07/23) 1 mo ago (12/05/22) 2 mo ago (11/12/22) 2 mo ago (11/05/22) 3 mo ago (10/05/22) 4 mo ago (09/05/22) 5 mo ago (08/02/22)  Prothrombin Time 11.4 - 15.2 seconds 28.4 High  33.0 High  26.6 High  37.3 High  31.4 High  36.0 High  28.6 High   INR 0.8 - 1.2 2.7 High  3.3 High  CM 2.5 High  CM 3.8 High  CM 3.1 High  CM 3.6 High  CM 2.7

## 2023-01-24 ENCOUNTER — Ambulatory Visit (INDEPENDENT_AMBULATORY_CARE_PROVIDER_SITE_OTHER): Payer: Medicare Other

## 2023-01-24 DIAGNOSIS — E538 Deficiency of other specified B group vitamins: Secondary | ICD-10-CM

## 2023-01-25 ENCOUNTER — Other Ambulatory Visit: Payer: Self-pay | Admitting: Family Medicine

## 2023-01-25 NOTE — Telephone Encounter (Signed)
Requested Prescriptions  Pending Prescriptions Disp Refills   cetirizine (ZYRTEC) 5 MG tablet [Pharmacy Med Name: CETIRIZINE HCL 5 MG TABLET] 90 tablet 3    Sig: Take 1 tablet (5 mg total) by mouth daily.     Ear, Nose, and Throat:  Antihistamines 2 Failed - 01/25/2023 12:13 PM      Failed - Cr in normal range and within 360 days    Creatinine  Date Value Ref Range Status  05/03/2014 1.71 (H) 0.60 - 1.30 mg/dL Final   Creatinine, Ser  Date Value Ref Range Status  12/27/2022 1.87 (H) 0.76 - 1.27 mg/dL Final         Passed - Valid encounter within last 12 months    Recent Outpatient Visits           4 weeks ago Primary hypertension   [facility] [place], [place], DO   1 month ago Primary hypertension   [place] [facility] Mecum, Dani Gobble, PA-C   3 months ago Balance problem   [place] P, DO   4 months ago Hypotension due to drugs   [place] P, DO   5 months ago Benign hypertensive renal disease   [place], [place], DO       Future Appointments             In 1 month Johnson, Barb Merino, DO [place], [facility]

## 2023-01-29 ENCOUNTER — Telehealth: Payer: Self-pay | Admitting: Family Medicine

## 2023-01-29 NOTE — Telephone Encounter (Signed)
Copied from [place] [id number]. Topic: General - Other >> Jan 29, 2023  3:07 PM [place] C wrote: Reason for CRM: Stanton Kidney with [facility] has called on behalf of the patient's wife to follow up on previously requested orders for Physical Therapy  Please contact further when possible to follow up

## 2023-01-29 NOTE — Telephone Encounter (Signed)
Can you please find out what they are needing, he had a referral for home health PT the last time he was seen 12/28 and per the computer, they came out and have completed his visits. Unclear what they need?

## 2023-01-29 NOTE — Telephone Encounter (Signed)
Spoke with Stanton Kidney from [place] and says the patient wife is requesting more visits. Per Stanton Kidney, states the patient's dementia is getting worse and not sure what more can be done for the patient. Stanton Kidney states the wife is more in a denial state of worsening dementia for patient. Please advise?

## 2023-02-05 ENCOUNTER — Inpatient Hospital Stay: Payer: Medicare Other | Attending: Internal Medicine

## 2023-02-05 DIAGNOSIS — C44310 Basal cell carcinoma of skin of unspecified parts of face: Secondary | ICD-10-CM | POA: Insufficient documentation

## 2023-02-05 DIAGNOSIS — I82512 Chronic embolism and thrombosis of left femoral vein: Secondary | ICD-10-CM | POA: Insufficient documentation

## 2023-02-05 LAB — BASIC METABOLIC PANEL
Anion gap: 8 (ref 5–15)
BUN: 21 mg/dL (ref 8–23)
CO2: 26 mmol/L (ref 22–32)
Calcium: 9.9 mg/dL (ref 8.9–10.3)
Chloride: 105 mmol/L (ref 98–111)
Creatinine, Ser: 1.88 mg/dL — ABNORMAL HIGH (ref 0.61–1.24)
GFR, Estimated: 34 mL/min — ABNORMAL LOW (ref >60)
Glucose, Bld: 86 mg/dL (ref 70–99)
Potassium: 4.7 mmol/L (ref 3.5–5.1)
Sodium: 139 mmol/L (ref 135–145)

## 2023-02-05 LAB — CBC WITH DIFFERENTIAL/PLATELET
Abs Immature Granulocytes: 0.01 10*3/uL (ref 0.00–0.07)
Basophils Absolute: 0 10*3/uL (ref 0.0–0.1)
Basophils Relative: 0 %
Eosinophils Absolute: 0.3 10*3/uL (ref 0.0–0.5)
Eosinophils Relative: 6 %
HCT: 44.7 % (ref 39.0–52.0)
Hemoglobin: 14.9 g/dL (ref 13.0–17.0)
Immature Granulocytes: 0 %
Lymphocytes Relative: 28 %
Lymphs Abs: 1.5 10*3/uL (ref 0.7–4.0)
MCH: 32 pg (ref 26.0–34.0)
MCHC: 33.3 g/dL (ref 30.0–36.0)
MCV: 96.1 fL (ref 80.0–100.0)
Monocytes Absolute: 0.6 10*3/uL (ref 0.1–1.0)
Monocytes Relative: 12 %
Neutro Abs: 2.9 10*3/uL (ref 1.7–7.7)
Neutrophils Relative %: 54 %
Platelets: 209 10*3/uL (ref 150–400)
RBC: 4.65 MIL/uL (ref 4.22–5.81)
RDW: 16.3 % — ABNORMAL HIGH (ref 11.5–15.5)
WBC: 5.3 10*3/uL (ref 4.0–10.5)
nRBC: 0 % (ref 0.0–0.2)

## 2023-02-05 LAB — PROTIME-INR
INR: 2.8 — ABNORMAL HIGH (ref 0.8–1.2)
Prothrombin Time: 29.1 seconds — ABNORMAL HIGH (ref 11.4–15.2)

## 2023-02-05 NOTE — Telephone Encounter (Signed)
Dr. Wynetta Emery stated this can be done at his appointment in a couple of weeks

## 2023-02-05 NOTE — Telephone Encounter (Signed)
They need another visit to restart it because they have finished their visits. We can do this when he comes in in a couple of weeks.

## 2023-02-19 ENCOUNTER — Ambulatory Visit: Payer: Medicare Other | Admitting: Family Medicine

## 2023-02-25 ENCOUNTER — Ambulatory Visit: Payer: Medicare Other | Admitting: Family Medicine

## 2023-02-25 ENCOUNTER — Encounter: Payer: Self-pay | Admitting: Family Medicine

## 2023-02-25 VITALS — BP 126/69 | HR 59 | Temp 98.1°F | Ht 67.0 in | Wt 162.2 lb

## 2023-02-25 DIAGNOSIS — R2689 Other abnormalities of gait and mobility: Secondary | ICD-10-CM | POA: Diagnosis not present

## 2023-02-25 DIAGNOSIS — I129 Hypertensive chronic kidney disease with stage 1 through stage 4 chronic kidney disease, or unspecified chronic kidney disease: Secondary | ICD-10-CM

## 2023-02-25 DIAGNOSIS — D692 Other nonthrombocytopenic purpura: Secondary | ICD-10-CM

## 2023-02-25 DIAGNOSIS — E538 Deficiency of other specified B group vitamins: Secondary | ICD-10-CM | POA: Diagnosis not present

## 2023-02-25 DIAGNOSIS — H6123 Impacted cerumen, bilateral: Secondary | ICD-10-CM | POA: Insufficient documentation

## 2023-02-25 DIAGNOSIS — R29898 Other symptoms and signs involving the musculoskeletal system: Secondary | ICD-10-CM | POA: Insufficient documentation

## 2023-02-25 MED ORDER — MONTELUKAST SODIUM 10 MG PO TABS: 10.0000 mg | ORAL_TABLET | Freq: Every day | ORAL | 1 refills | Status: DC

## 2023-02-25 NOTE — Assessment & Plan Note (Addendum)
Acute, stable. Ear irrigation done today to remove excessive wax, educated on avoiding use of Q-tips for cleaning. Return if symptoms fail to improve.

## 2023-02-25 NOTE — Assessment & Plan Note (Signed)
Chronic, stable. B12 shot given today.

## 2023-02-25 NOTE — Assessment & Plan Note (Addendum)
Reassured patient. Continue to monitor. Call with any concerns.

## 2023-02-25 NOTE — Assessment & Plan Note (Signed)
Chronic, stable. Pt is at an increased risk for falling at home, referral sent for Home health Physical therapy.

## 2023-02-25 NOTE — Progress Notes (Signed)
BP 126/69   Pulse (!) 59   Temp 98.1 F (36.7 C) (Oral)   Ht '5\' 7"'$  (1.702 m)   Wt 162 lb 3.2 oz (73.6 kg)   SpO2 97%   BMI 25.40 kg/m    Subjective:    Patient ID: Gregg Thomas, male    DOB: 1931-07-30, 89 y.o.   MRN: [id number]  HPI: Gregg Thomas is a 89 y.o. male  Chief Complaint  Patient presents with  . Hypertension  . Ear Fullness    Patient wife says she would like to have patients ear checked as she is noticing ear wax caked on his ear aids.    EAG CLOGGED Duration: weeks Involved ear(s):  bilateral Sensation of feeling clogged/plugged: yes Decreased/muffled hearing:yes Ear pain: no Fever: no Otorrhea: yes Hearing loss: yes Upper respiratory infection symptoms: no Using Q-Tips: no Status: worse History of cerumenosis: yes Treatments attempted: none  Continues with issues with balance. Has not had any falls since the last time he was seen. He continues with some   Relevant past medical, surgical, family and social history reviewed and updated as indicated. Interim medical history since our last visit reviewed. Allergies and medications reviewed and updated.  Review of Systems  Constitutional: Negative.   Respiratory: Negative.    Cardiovascular: Negative.   Gastrointestinal: Negative.   Musculoskeletal: Negative.   Skin: Negative.   Psychiatric/Behavioral: Negative.      Per HPI unless specifically indicated above     Objective:    BP 126/69   Pulse (!) 59   Temp 98.1 F (36.7 C) (Oral)   Ht '5\' 7"'$  (1.702 m)   Wt 162 lb 3.2 oz (73.6 kg)   SpO2 97%   BMI 25.40 kg/m   Wt Readings from Last 3 Encounters:  02/25/23 162 lb 3.2 oz (73.6 kg)  12/27/22 161 lb 3.2 oz (73.1 kg)  11/29/22 165 lb (74.8 kg)    Physical Exam Vitals and nursing note reviewed.  Constitutional:      General: He is not in acute distress.    Appearance: Normal appearance. He is normal weight. He is not ill-appearing, toxic-appearing or diaphoretic.   HENT:     Head: Normocephalic and atraumatic.     Comments: Wax in bilateral EAC, not significantly impacted    Right Ear: External ear normal.     Left Ear: External ear normal.     Nose: Nose normal.     Mouth/Throat:     Mouth: Mucous membranes are moist.     Pharynx: Oropharynx is clear.  Eyes:     General: No scleral icterus.       Right eye: No discharge.        Left eye: No discharge.     Extraocular Movements: Extraocular movements intact.     Conjunctiva/sclera: Conjunctivae normal.     Pupils: Pupils are equal, round, and reactive to light.  Cardiovascular:     Rate and Rhythm: Normal rate and regular rhythm.     Pulses: Normal pulses.     Heart sounds: Normal heart sounds. No murmur heard.    No friction rub. No gallop.  Pulmonary:     Effort: Pulmonary effort is normal. No respiratory distress.     Breath sounds: Normal breath sounds. No stridor. No wheezing, rhonchi or rales.  Chest:     Chest wall: No tenderness.  Musculoskeletal:        General: Normal range of motion.  Cervical back: Normal range of motion and neck supple.  Skin:    General: Skin is warm and dry.     Capillary Refill: Capillary refill takes less than 2 seconds.     Coloration: Skin is not jaundiced or pale.     Findings: No bruising, erythema, lesion or rash.  Neurological:     General: No focal deficit present.     Mental Status: He is alert and oriented to person, place, and time. Mental status is at baseline.  Psychiatric:        Mood and Affect: Mood normal.        Behavior: Behavior normal.        Thought Content: Thought content normal.        Judgment: Judgment normal.    Results for orders placed or performed in visit on [id number]  Basic metabolic panel  Result Value Ref Range   Sodium 139 135 - 145 mmol/L   Potassium 4.7 3.5 - 5.1 mmol/L   Chloride 105 98 - 111 mmol/L   CO2 26 22 - 32 mmol/L   Glucose, Bld 86 70 - 99 mg/dL   BUN 21 8 - 23 mg/dL   Creatinine, Ser 1.88  (H) 0.61 - 1.24 mg/dL   Calcium 9.9 8.9 - 10.3 mg/dL   GFR, Estimated 34 (L) >60 mL/min   Anion gap 8 5 - 15  CBC with Differential/Platelet  Result Value Ref Range   WBC 5.3 4.0 - 10.5 K/uL   RBC 4.65 4.22 - 5.81 MIL/uL   Hemoglobin 14.9 13.0 - 17.0 g/dL   HCT 44.7 39.0 - 52.0 %   MCV 96.1 80.0 - 100.0 fL   MCH 32.0 26.0 - 34.0 pg   MCHC 33.3 30.0 - 36.0 g/dL   RDW 16.3 (H) 11.5 - 15.5 %   Platelets 209 150 - 400 K/uL   nRBC 0.0 0.0 - 0.2 %   Neutrophils Relative % 54 %   Neutro Abs 2.9 1.7 - 7.7 K/uL   Lymphocytes Relative 28 %   Lymphs Abs 1.5 0.7 - 4.0 K/uL   Monocytes Relative 12 %   Monocytes Absolute 0.6 0.1 - 1.0 K/uL   Eosinophils Relative 6 %   Eosinophils Absolute 0.3 0.0 - 0.5 K/uL   Basophils Relative 0 %   Basophils Absolute 0.0 0.0 - 0.1 K/uL   Immature Granulocytes 0 %   Abs Immature Granulocytes 0.01 0.00 - 0.07 K/uL  Protime-INR  Result Value Ref Range   Prothrombin Time 29.1 (H) 11.4 - 15.2 seconds   INR 2.8 (H) 0.8 - 1.2      Assessment & Plan:   Problem List Items Addressed This Visit       Genitourinary   Benign hypertensive renal disease - Primary     Follow up plan: No follow-ups on file.

## 2023-02-25 NOTE — Progress Notes (Unsigned)
BP 126/69   Pulse (!) 59   Temp 98.1 F (36.7 C) (Oral)   Ht '5\' 7"'$  (1.702 m)   Wt 162 lb 3.2 oz (73.6 kg)   SpO2 97%   BMI 25.40 kg/m    Subjective:    Patient ID: Gregg Thomas, male    DOB: 10/03/1931, 89 y.o.   MRN: [id number]  HPI: Gregg Thomas is a 89 y.o. male  Chief Complaint  Patient presents with   Hypertension   Ear Fullness    Patient wife says she would like to have patients ear checked as she is noticing ear wax caked on his ear aids.     Relevant past medical, surgical, family and social history reviewed and updated as indicated. Interim medical history since our last visit reviewed. Allergies and medications reviewed and updated.  HYPERTENSION with Chronic Kidney Disease Hypertension status: stable  Satisfied with current treatment? yes Duration of hypertension: chronic BP monitoring frequency:  a few times a week BP range: 140/60-70, can't rememeber.  BP medication side effects:  no Medication compliance: excellent compliance Aspirin: no Recurrent headaches: no Visual changes: no Palpitations: no Dyspnea: no Chest pain: no Lower extremity edema: no Dizzy/lightheaded: no   EAR CLOGGED  Notices a build up of ear wax on bilateral hearing aids.  Duration: months Involved ear(s): bilateral Fever: no Otorrhea: no Upper respiratory infection symptoms: no Pruritus: no Hearing loss: no Water immersion no Using Q-tips: no Recurrent otitis media: no Status: worse Treatments attempted: none       Review of Systems  HENT:  Negative for congestion, ear discharge, ear pain and hearing loss.   Respiratory:  Negative for apnea, cough, choking, chest tightness, shortness of breath, wheezing and stridor.   Cardiovascular:  Negative for chest pain, palpitations and leg swelling.    Per HPI unless specifically indicated above     Objective:    BP 126/69   Pulse (!) 59   Temp 98.1 F (36.7 C) (Oral)   Ht '5\' 7"'$  (1.702 m)    Wt 162 lb 3.2 oz (73.6 kg)   SpO2 97%   BMI 25.40 kg/m   Wt Readings from Last 3 Encounters:  02/25/23 162 lb 3.2 oz (73.6 kg)  12/27/22 161 lb 3.2 oz (73.1 kg)  11/29/22 165 lb (74.8 kg)    Physical Exam Vitals and nursing note reviewed.  Constitutional:      General: He is awake. He is not in acute distress.    Appearance: He is well-developed and well-groomed. He is not ill-appearing.  HENT:     Head: Normocephalic and atraumatic.     Right Ear: Hearing and external ear normal. No drainage. There is impacted cerumen.     Left Ear: Hearing and external ear normal. No drainage. There is impacted cerumen.     Nose: Nose normal.  Eyes:     General: Lids are normal.        Right eye: No discharge.        Left eye: No discharge.     Conjunctiva/sclera: Conjunctivae normal.     Comments: Erythematous palpebral conjunctivae  Cardiovascular:     Rate and Rhythm: Normal rate and regular rhythm.     Heart sounds: Normal heart sounds, S1 normal and S2 normal. No murmur heard.    No gallop.  Pulmonary:     Effort: No accessory muscle usage or respiratory distress.     Breath sounds: Normal breath sounds.  Musculoskeletal:  General: Normal range of motion.     Right lower leg: No edema.     Left lower leg: No edema.  Skin:    General: Skin is warm and dry.     Capillary Refill: Capillary refill takes less than 2 seconds.  Neurological:     Mental Status: He is alert and oriented to person, place, and time.  Psychiatric:        Attention and Perception: Attention normal.        Mood and Affect: Mood normal.        Speech: Speech normal.        Behavior: Behavior normal. Behavior is cooperative.        Thought Content: Thought content normal.     Results for orders placed or performed in visit on [id number]  Basic metabolic panel  Result Value Ref Range   Sodium 139 135 - 145 mmol/L   Potassium 4.7 3.5 - 5.1 mmol/L   Chloride 105 98 - 111 mmol/L   CO2 26 22 - 32 mmol/L    Glucose, Bld 86 70 - 99 mg/dL   BUN 21 8 - 23 mg/dL   Creatinine, Ser 1.88 (H) 0.61 - 1.24 mg/dL   Calcium 9.9 8.9 - 10.3 mg/dL   GFR, Estimated 34 (L) >60 mL/min   Anion gap 8 5 - 15  CBC with Differential/Platelet  Result Value Ref Range   WBC 5.3 4.0 - 10.5 K/uL   RBC 4.65 4.22 - 5.81 MIL/uL   Hemoglobin 14.9 13.0 - 17.0 g/dL   HCT 44.7 39.0 - 52.0 %   MCV 96.1 80.0 - 100.0 fL   MCH 32.0 26.0 - 34.0 pg   MCHC 33.3 30.0 - 36.0 g/dL   RDW 16.3 (H) 11.5 - 15.5 %   Platelets 209 150 - 400 K/uL   nRBC 0.0 0.0 - 0.2 %   Neutrophils Relative % 54 %   Neutro Abs 2.9 1.7 - 7.7 K/uL   Lymphocytes Relative 28 %   Lymphs Abs 1.5 0.7 - 4.0 K/uL   Monocytes Relative 12 %   Monocytes Absolute 0.6 0.1 - 1.0 K/uL   Eosinophils Relative 6 %   Eosinophils Absolute 0.3 0.0 - 0.5 K/uL   Basophils Relative 0 %   Basophils Absolute 0.0 0.0 - 0.1 K/uL   Immature Granulocytes 0 %   Abs Immature Granulocytes 0.01 0.00 - 0.07 K/uL  Protime-INR  Result Value Ref Range   Prothrombin Time 29.1 (H) 11.4 - 15.2 seconds   INR 2.8 (H) 0.8 - 1.2      Assessment & Plan:   Problem List Items Addressed This Visit       Cardiovascular and Mediastinum   Senile purpura ([place])    Reassured patient. Continue to monitor. Call with any concerns.       Relevant Orders   Ambulatory referral to [place] and [facility]   Bilateral impacted cerumen    Acute, stable. Ear irrigation done today to remove excessive wax, educated on avoiding use of Q-tips for cleaning. Return if symptoms fail to improve.       Relevant Orders   Ambulatory referral to [place]     Musculoskeletal and Integument   Muscular deconditioning    Chronic, stable. Pt is at an increased risk for falling at home, referral sent for Home health Physical therapy.       Relevant Orders   Ambulatory referral to [place]  Genitourinary   Benign hypertensive renal disease    Chronic, stable. Continue current  dose of 2.'5mg'$  of Amlodipine daily. Recommend bringing home BP readings in at next visit.       Relevant Orders   Ambulatory referral to [place]     Other   B12 deficiency    Chronic, stable. B12 shot given today.       Relevant Orders   Ambulatory referral to [place] problem - Primary    Chronic, stable. Pt is at an increased risk for falling at home, referral sent for Home health Physical therapy.       Relevant Orders   Ambulatory referral to [facility]     Follow up plan: Return in about 2 months (around 04/26/2023).

## 2023-02-25 NOTE — Assessment & Plan Note (Signed)
Chronic, stable. Continue current dose of 2.'5mg'$  of Amlodipine daily. Recommend bringing home BP readings in at next visit.

## 2023-02-26 ENCOUNTER — Encounter: Payer: Self-pay | Admitting: Family Medicine

## 2023-03-05 ENCOUNTER — Other Ambulatory Visit: Payer: Self-pay | Admitting: *Deleted

## 2023-03-05 DIAGNOSIS — I82512 Chronic embolism and thrombosis of left femoral vein: Secondary | ICD-10-CM

## 2023-03-06 ENCOUNTER — Inpatient Hospital Stay: Payer: Medicare Other | Attending: Internal Medicine

## 2023-03-06 ENCOUNTER — Inpatient Hospital Stay: Payer: Medicare Other

## 2023-03-06 ENCOUNTER — Inpatient Hospital Stay (HOSPITAL_BASED_OUTPATIENT_CLINIC_OR_DEPARTMENT_OTHER): Payer: Medicare Other | Admitting: Internal Medicine

## 2023-03-06 ENCOUNTER — Encounter: Payer: Self-pay | Admitting: Internal Medicine

## 2023-03-06 VITALS — BP 163/90 | HR 55 | Temp 96.2°F | Resp 16 | Wt 160.7 lb

## 2023-03-06 DIAGNOSIS — I82512 Chronic embolism and thrombosis of left femoral vein: Secondary | ICD-10-CM

## 2023-03-06 DIAGNOSIS — M791 Myalgia, unspecified site: Secondary | ICD-10-CM | POA: Insufficient documentation

## 2023-03-06 DIAGNOSIS — N183 Chronic kidney disease, stage 3 unspecified: Secondary | ICD-10-CM | POA: Diagnosis not present

## 2023-03-06 DIAGNOSIS — R5383 Other fatigue: Secondary | ICD-10-CM | POA: Insufficient documentation

## 2023-03-06 DIAGNOSIS — Z85828 Personal history of other malignant neoplasm of skin: Secondary | ICD-10-CM | POA: Diagnosis not present

## 2023-03-06 DIAGNOSIS — R413 Other amnesia: Secondary | ICD-10-CM | POA: Insufficient documentation

## 2023-03-06 DIAGNOSIS — R252 Cramp and spasm: Secondary | ICD-10-CM | POA: Diagnosis not present

## 2023-03-06 DIAGNOSIS — M2550 Pain in unspecified joint: Secondary | ICD-10-CM | POA: Insufficient documentation

## 2023-03-06 DIAGNOSIS — Z86711 Personal history of pulmonary embolism: Secondary | ICD-10-CM | POA: Insufficient documentation

## 2023-03-06 DIAGNOSIS — Z8249 Family history of ischemic heart disease and other diseases of the circulatory system: Secondary | ICD-10-CM | POA: Diagnosis not present

## 2023-03-06 DIAGNOSIS — Z823 Family history of stroke: Secondary | ICD-10-CM | POA: Diagnosis not present

## 2023-03-06 DIAGNOSIS — Z7901 Long term (current) use of anticoagulants: Secondary | ICD-10-CM | POA: Insufficient documentation

## 2023-03-06 DIAGNOSIS — Z79899 Other long term (current) drug therapy: Secondary | ICD-10-CM | POA: Insufficient documentation

## 2023-03-06 DIAGNOSIS — C44310 Basal cell carcinoma of skin of unspecified parts of face: Secondary | ICD-10-CM | POA: Diagnosis not present

## 2023-03-06 LAB — CBC WITH DIFFERENTIAL (CANCER CENTER ONLY)
Abs Immature Granulocytes: 0.02 10*3/uL (ref 0.00–0.07)
Basophils Absolute: 0 10*3/uL (ref 0.0–0.1)
Basophils Relative: 1 %
Eosinophils Absolute: 0.2 10*3/uL (ref 0.0–0.5)
Eosinophils Relative: 4 %
HCT: 42.6 % (ref 39.0–52.0)
Hemoglobin: 14.2 g/dL (ref 13.0–17.0)
Immature Granulocytes: 0 %
Lymphocytes Relative: 31 %
Lymphs Abs: 1.5 10*3/uL (ref 0.7–4.0)
MCH: 32.3 pg (ref 26.0–34.0)
MCHC: 33.3 g/dL (ref 30.0–36.0)
MCV: 97 fL (ref 80.0–100.0)
Monocytes Absolute: 0.5 10*3/uL (ref 0.1–1.0)
Monocytes Relative: 11 %
Neutro Abs: 2.6 10*3/uL (ref 1.7–7.7)
Neutrophils Relative %: 53 %
Platelet Count: 187 10*3/uL (ref 150–400)
RBC: 4.39 MIL/uL (ref 4.22–5.81)
RDW: 17.2 % — ABNORMAL HIGH (ref 11.5–15.5)
WBC Count: 4.9 10*3/uL (ref 4.0–10.5)
nRBC: 0 % (ref 0.0–0.2)

## 2023-03-06 LAB — BASIC METABOLIC PANEL - CANCER CENTER ONLY
Anion gap: 6 (ref 5–15)
BUN: 21 mg/dL (ref 8–23)
CO2: 28 mmol/L (ref 22–32)
Calcium: 9.8 mg/dL (ref 8.9–10.3)
Chloride: 107 mmol/L (ref 98–111)
Creatinine: 1.81 mg/dL — ABNORMAL HIGH (ref 0.61–1.24)
GFR, Estimated: 35 mL/min — ABNORMAL LOW (ref >60)
Glucose, Bld: 81 mg/dL (ref 70–99)
Potassium: 4.6 mmol/L (ref 3.5–5.1)
Sodium: 141 mmol/L (ref 135–145)

## 2023-03-06 LAB — PROTIME-INR
INR: 2.3 — ABNORMAL HIGH (ref 0.8–1.2)
Prothrombin Time: 24.7 seconds — ABNORMAL HIGH (ref 11.4–15.2)

## 2023-03-06 NOTE — Assessment & Plan Note (Signed)
#   Left lower extremity chronic DVT/PE Q000111Q new thromboembolic events. Stable- on Coumadin/warfarin- on 5 mg a day- [no major fluctuations noted on hedgehog inhibitor].  INR from today-pending- stable.    # Basal cell carcinoma: on Hedgehog inhibitor [Dr.Dasher]-stable.   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  Stable.   #CKD-III [GFR 30s]-STABLE; Dr.Lateef-  Stable.   # DISPOSITION: # PT/INR every month x 6  # in 6 months- MD; cbc/bmp/Pt/INR-Dr.B  Cc;

## 2023-03-06 NOTE — Progress Notes (Signed)
Patient denies new problems/concerns today.    Receiving home PT for leg strengthening   Current Coumadin dose is 5 mg QD.  BP 165/79 HR 62 first check 163/90 HR 55 second check

## 2023-03-06 NOTE — Progress Notes (Signed)
[facility] CONSULT NOTE  Patient Care Team: Valerie Roys, DO as PCP - General (Family Medicine) Lequita Asal, MD (Inactive) as Referring Physician (Hematology and Oncology) Dagoberto Ligas, MD as Referring Physician (Internal Medicine) Merril Abbe, MD as Referring Physician (Dermatology) Cammie Sickle, MD as Consulting Physician (Hematology and Oncology)  CHIEF COMPLAINTS/PURPOSE OF CONSULTATION: Left lower extremity DVT  #  Oncology History Overview Note  #Left lower extremity chronic DVT/PE [2011]-on Coumadin [Drs.Gittin/[place]an]  #2021-Left facial basal cell carcinoma ERIVEDGE- [Dr.Dasher]  #CKD [Dr.Lateef]/    Squamous cell carcinoma, 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  Accompanied by his wife.  Timothy Hosten 89 y.o.  male history of chronic left lower extremity DVT on Coumadin and history of CKD-III-IV; on erivedge for [facility] is here for follow-up.    Receiving home PT for leg strengthening. NO falls. Patient Currently on  Coumadin dose is 5 mg QD.  Patient continues to be on erivedge continuously [previously intermittently] for basal cell carcinoma.  Otherwise no recent hospitalizations. Denies any weight loss.  Denies any nausea vomiting.  No diarrhea.  Denies any blood in stools or black or stools.  No nausea no vomiting.  Review of Systems  Constitutional:  Positive for malaise/fatigue. Negative for chills, diaphoresis, fever and weight loss.  HENT:  Negative for nosebleeds and sore throat.   Eyes:  Negative for double vision.  Respiratory:  Negative for cough, hemoptysis, sputum production, shortness of breath and wheezing.   Cardiovascular:  Negative for chest pain, palpitations, orthopnea and leg swelling.  Gastrointestinal:  Negative for abdominal pain, blood in stool, constipation, diarrhea, heartburn, melena, nausea and  vomiting.  Genitourinary:  Negative for dysuria, frequency and urgency.  Musculoskeletal:  Positive for joint pain and myalgias. Negative for back pain.  Skin: Negative.  Negative for itching and rash.  Neurological:  Negative for dizziness, tingling, focal weakness, weakness and headaches.  Endo/Heme/Allergies:  Bruises/bleeds easily.  Psychiatric/Behavioral:  Positive for memory loss. Negative for depression. The patient is not nervous/anxious and does not have insomnia.      MEDICAL HISTORY:  Past Medical History:  Diagnosis Date   Chronic kidney disease    Clotting disorder ([place])    Heart murmur    Skin cancer     SURGICAL HISTORY: Past Surgical History:  Procedure Laterality Date   biopsy Right 08/26/2019   temple biopsy - dr.dasher    EYE SURGERY Right    benign growth on right eye    KNEE SURGERY Right    MELANOMA EXCISION Bilateral    lower back     SOCIAL HISTORY: Social History   Socioeconomic History   Marital status: Married    Spouse name: Not on file   Number of children: Not on file   Years of education: 13 years    Highest education level: Some college, no degree  Occupational History   Occupation: retired  Tobacco Use   Smoking status: Never   Smokeless tobacco: Never  Vaping Use   Vaping Use: Never used  Substance and Sexual Activity   Alcohol use: No   Drug use: No   Sexual activity: Not Currently  Other Topics Concern   Not on file  Social History Narrative   [place] 2 times a week, alk 3 times a week  Social Determinants of Health   Financial Resource Strain: Low Risk  (09/24/2022)   Overall Financial Resource Strain (CARDIA)    Difficulty of Paying Living Expenses: Not hard at all  Food Insecurity: No Food Insecurity (09/24/2022)   Hunger Vital Sign    Worried About Running Out of Food in the Last Year: Never true    Ran Out of Food in the Last Year: Never true   Transportation Needs: No Transportation Needs (09/24/2022)   PRAPARE - Hydrologist (Medical): No    Lack of Transportation (Non-Medical): No  Physical Activity: Insufficiently Active (09/24/2022)   Exercise Vital Sign    Days of Exercise per Week: 2 days    Minutes of Exercise per Session: 20 min  Stress: No Stress Concern Present (09/24/2022)   [place]    Feeling of Stress : Not at all  Social Connections: Moderately Integrated (09/24/2022)   Social Connection and Isolation Panel [NHANES]    Frequency of Communication with Friends and Family: Once a week    Frequency of Social Gatherings with Friends and Family: Once a week    Attends Religious Services: More than 4 times per year    Active Member of [employer] or Organizations: No    Attends Music therapist: More than 4 times per year    Marital Status: Married  Human resources officer Violence: Not At Risk (09/24/2022)   Humiliation, Afraid, Rape, and Kick questionnaire    Fear of Current or Ex-Partner: No    Emotionally Abused: No    Physically Abused: No    Sexually Abused: No    FAMILY HISTORY: Family History  Problem Relation Age of Onset   Stroke Mother    Cerebral aneurysm Father     ALLERGIES:  has no active allergies.  MEDICATIONS:  Current Outpatient Medications  Medication Sig Dispense Refill   amLODipine (NORVASC) 2.5 MG tablet Take 1 tablet (2.5 mg total) by mouth daily. 90 tablet 1   ascorbic acid (VITAMIN C) 1000 MG tablet Take by mouth.     cetirizine (ZYRTEC) 5 MG tablet Take 1 tablet (5 mg total) by mouth daily. 90 tablet 3   Cholecalciferol (VITAMIN D3 PO) Take 1 tablet by mouth daily.     Cyanocobalamin (VITAMIN B-12 ER PO) Take 1,000 mcg by mouth daily at 6 (six) AM.     donepezil (ARICEPT) 10 MG tablet Take 1 tablet (10 mg total) by mouth at bedtime. 90 tablet 1   ELDERBERRY PO Take 1 tablet by mouth  daily.     ERIVEDGE 150 MG capsule Take 150 mg by mouth daily.     fluticasone (FLONASE) 50 MCG/ACT nasal spray Place 2 sprays into both nostrils daily. 16 g 6   gabapentin (NEURONTIN) 100 MG capsule Take 2 capsules (200 mg total) by mouth at bedtime. 180 capsule 1   Grape Seed Extract 50 MG CAPS Take 1,300 mg by mouth daily.      Ipratropium-Albuterol (COMBIVENT) 20-100 MCG/ACT AERS respimat Inhale 1 puff into the lungs every 6 (six) hours.     montelukast (SINGULAIR) 10 MG tablet Take 1 tablet (10 mg total) by mouth at bedtime. 90 tablet 1   warfarin (COUMADIN) 5 MG tablet Take 1 tablet (5 mg total) by mouth one time only at 6 PM. 90 tablet 0   zinc gluconate 50 MG tablet Take by mouth.     warfarin (COUMADIN) 2 MG tablet  On Saturday and Sunday-take 1 pill a day [along with 5 mg-a total of 7 mg]. (Patient not taking: Reported on 03/06/2023) 30 tablet 1   warfarin (COUMADIN) 3 MG tablet Take 1 tablet (3 mg total) by mouth daily. As directed on Thursday 11/9. (Patient not taking: Reported on 03/06/2023) 5 tablet 0   Current Facility-Administered Medications  Medication Dose Route Frequency Provider Last Rate Last Admin   cyanocobalamin ((VITAMIN B-12)) injection 1,000 mcg  1,000 mcg Intramuscular Q30 days Wynetta Emery, Megan P, DO   1,000 mcg at 02/25/23 1507      .  PHYSICAL EXAMINATION: ECOG PERFORMANCE STATUS: 0 - Asymptomatic  Vitals:   03/06/23 1400 03/06/23 1416  BP: (!) 165/79 (!) 163/90  Pulse: 62 (!) 55  Resp: 16   Temp: (!) 96.2 F (35.7 C)     Filed Weights   03/06/23 1400  Weight: 160 lb 11.2 oz (72.9 kg)     Physical Exam Constitutional:      Comments: Elderly appearing Caucasian male patient accompanied by his wife.  Is walking himself.  HENT:     Head: Normocephalic and atraumatic.     Mouth/Throat:     Pharynx: No oropharyngeal exudate.  Eyes:     Pupils: Pupils are equal, round, and reactive to light.  Cardiovascular:     Rate and Rhythm: Normal rate and  regular rhythm.  Pulmonary:     Effort: Pulmonary effort is normal. No respiratory distress.     Breath sounds: Normal breath sounds. No wheezing.  Abdominal:     General: Bowel sounds are normal. There is no distension.     Palpations: Abdomen is soft. There is no mass.     Tenderness: There is no abdominal tenderness. There is no guarding or rebound.  Musculoskeletal:        General: No tenderness. Normal range of motion.     Cervical back: Normal range of motion and neck supple.  Skin:    General: Skin is warm.  Neurological:     Mental Status: He is alert and oriented to person, place, and time.  Psychiatric:        Mood and Affect: Affect normal.      LABORATORY DATA:  I have reviewed the data as listed Lab Results  Component Value Date   WBC 4.9 03/06/2023   HGB 14.2 03/06/2023   HCT 42.6 03/06/2023   MCV 97.0 03/06/2023   PLT 187 03/06/2023   Recent Labs    04/19/22 1337 07/06/22 1413 08/02/22 1422 12/27/22 1143 02/05/23 1509 03/06/23 1357  NA 144   < > 139 149* 139 141  K 4.8   < > 4.1 4.4 4.7 4.6  CL 108*   < > 108 111* 105 107  CO2 24   < > '25 22 26 28  '$ GLUCOSE 95   < > 103* 88 86 81  BUN 21   < > 26* '20 21 21  '$ CREATININE 1.89*   < > 2.11* 1.87* 1.88* 1.81*  CALCIUM 10.9*   < > 9.9 9.9 9.9 9.8  GFRNONAA  --   --  30*  --  34* 35*  PROT 6.8  --   --   --   --   --   ALBUMIN 4.1  --   --   --   --   --   AST 28  --   --   --   --   --   ALT 23  --   --   --   --   --  ALKPHOS 109  --   --   --   --   --   BILITOT 0.3  --   --   --   --   --    < > = values in this interval not displayed.    RADIOGRAPHIC STUDIES: I have personally reviewed the radiological images as listed and agreed with the findings in the report. No results found.  ASSESSMENT & PLAN:   Chronic deep vein thrombosis (DVT) of femoral vein of left lower extremity (HCC) # Left lower extremity chronic DVT/PE Q000111Q new thromboembolic events. Stable- on Coumadin/warfarin- on 5 mg  a day- [no major fluctuations noted on hedgehog inhibitor].  INR from today-pending- stable.    # Basal cell carcinoma: on Hedgehog inhibitor [Dr.Dasher]-stable.   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  Stable.   #CKD-III [GFR 30s]-STABLE; Dr.Lateef-  Stable.   # DISPOSITION: # PT/INR every month x 6  # in 6 months- MD; cbc/bmp/Pt/INR-Dr.B  Cc;   All questions were answered. The patient knows to call the clinic with any problems, questions or concerns.    Cammie Sickle, MD 03/06/2023 2:49 PM

## 2023-03-07 ENCOUNTER — Telehealth: Payer: Self-pay

## 2023-03-07 ENCOUNTER — Ambulatory Visit: Payer: Medicare Other | Admitting: Oncology

## 2023-03-07 NOTE — Telephone Encounter (Signed)
-----   Message from Cammie Sickle, MD sent at 03/06/2023  5:37 PM EST ----- Regarding: FW: Please inform patient to continue Coumadin. As INR in the range. Thanks,  GB ----- Message ----- From: Buel Ream, Lab [facility] [place] Sent: 03/06/2023   2:14 PM EST To: Cammie Sickle, MD

## 2023-03-07 NOTE — Telephone Encounter (Signed)
Message left on patient's home answering machine with MD recommendations.

## 2023-03-27 ENCOUNTER — Ambulatory Visit (INDEPENDENT_AMBULATORY_CARE_PROVIDER_SITE_OTHER): Payer: Medicare Other

## 2023-03-27 DIAGNOSIS — E538 Deficiency of other specified B group vitamins: Secondary | ICD-10-CM

## 2023-04-03 ENCOUNTER — Inpatient Hospital Stay: Payer: Medicare Other | Attending: Internal Medicine

## 2023-04-03 DIAGNOSIS — I82512 Chronic embolism and thrombosis of left femoral vein: Secondary | ICD-10-CM | POA: Diagnosis present

## 2023-04-03 DIAGNOSIS — C44310 Basal cell carcinoma of skin of unspecified parts of face: Secondary | ICD-10-CM | POA: Diagnosis not present

## 2023-04-03 LAB — PROTIME-INR
INR: 2.4 — ABNORMAL HIGH (ref 0.8–1.2)
Prothrombin Time: 26.2 seconds — ABNORMAL HIGH (ref 11.4–15.2)

## 2023-04-05 ENCOUNTER — Other Ambulatory Visit: Payer: Medicare Other

## 2023-04-18 ENCOUNTER — Other Ambulatory Visit: Payer: Self-pay | Admitting: Internal Medicine

## 2023-04-18 NOTE — Telephone Encounter (Signed)
Component Ref Range & Units 1 mo ago (03/06/23) 2 mo ago (02/05/23) 3 mo ago (01/07/23) 4 mo ago (12/05/22) 5 mo ago (11/12/22) 5 mo ago (11/05/22) 6 mo ago (10/05/22)  Prothrombin Time 11.4 - 15.2 seconds 24.7 High  29.1 High  28.4 High  33.0 High  26.6 High  37.3 High  31.4 High   INR 0.8 - 1.2 2.3 High  2.8 High  CM 2.7 High  CM 3.3 High  CM 2.5 High  CM 3.8 High  CM 3.1 High

## 2023-04-29 ENCOUNTER — Encounter: Payer: Self-pay | Admitting: Family Medicine

## 2023-04-29 ENCOUNTER — Ambulatory Visit (INDEPENDENT_AMBULATORY_CARE_PROVIDER_SITE_OTHER): Payer: Medicare Other | Admitting: Family Medicine

## 2023-04-29 VITALS — BP 125/64 | HR 66 | Temp 98.6°F | Wt 158.9 lb

## 2023-04-29 DIAGNOSIS — I10 Essential (primary) hypertension: Secondary | ICD-10-CM | POA: Diagnosis not present

## 2023-04-29 DIAGNOSIS — Z0000 Encounter for general adult medical examination without abnormal findings: Secondary | ICD-10-CM

## 2023-04-29 DIAGNOSIS — N184 Chronic kidney disease, stage 4 (severe): Secondary | ICD-10-CM

## 2023-04-29 DIAGNOSIS — D692 Other nonthrombocytopenic purpura: Secondary | ICD-10-CM

## 2023-04-29 DIAGNOSIS — E538 Deficiency of other specified B group vitamins: Secondary | ICD-10-CM | POA: Diagnosis not present

## 2023-04-29 DIAGNOSIS — D6869 Other thrombophilia: Secondary | ICD-10-CM

## 2023-04-29 DIAGNOSIS — I129 Hypertensive chronic kidney disease with stage 1 through stage 4 chronic kidney disease, or unspecified chronic kidney disease: Secondary | ICD-10-CM | POA: Diagnosis not present

## 2023-04-29 MED ORDER — FLUTICASONE PROPIONATE 50 MCG/ACT NA SUSP: 2.0000 | Freq: Every day | NASAL | 6 refills | Status: DC

## 2023-04-29 MED ORDER — MONTELUKAST SODIUM 10 MG PO TABS: 10.0000 mg | ORAL_TABLET | Freq: Every day | ORAL | 1 refills | Status: DC

## 2023-04-29 MED ORDER — DONEPEZIL HCL 10 MG PO TABS: 10.0000 mg | ORAL_TABLET | Freq: Every day | ORAL | 1 refills | Status: DC

## 2023-04-29 MED ORDER — CETIRIZINE HCL 5 MG PO TABS: 5.0000 mg | ORAL_TABLET | Freq: Every day | ORAL | 3 refills | Status: DC

## 2023-04-29 MED ORDER — GABAPENTIN 100 MG PO CAPS: 200.0000 mg | ORAL_CAPSULE | Freq: Every day | ORAL | 1 refills | Status: DC

## 2023-04-29 MED ORDER — AMLODIPINE BESYLATE 2.5 MG PO TABS: 2.5000 mg | ORAL_TABLET | Freq: Every day | ORAL | 1 refills | Status: DC

## 2023-04-29 NOTE — Assessment & Plan Note (Signed)
Under good control on current regimen. Continue current regimen. Continue to monitor. Call with any concerns.   

## 2023-04-29 NOTE — Assessment & Plan Note (Signed)
Reassured patient. Continue to monitor.  

## 2023-04-29 NOTE — Progress Notes (Signed)
BP 125/64   Pulse 66   Temp 98.6 F (37 C) (Oral)   Wt 158 lb 14.4 oz (72.1 kg)   SpO2 94%   BMI 24.89 kg/m    Subjective:    Patient ID: Gregg Thomas, male    DOB: 1931-11-03, 89 y.o.   MRN: [id number]  HPI: Gregg Thomas is a 89 y.o. male presenting on 04/29/2023 for comprehensive medical examination. Current medical complaints include:  HYPERTENSION / HYPERLIPIDEMIA Satisfied with current treatment? yes Duration of hypertension: chronic BP monitoring frequency: not checking BP medication side effects: no Past BP meds: amlodipine Duration of hyperlipidemia: chronic Cholesterol medication side effects: no Cholesterol supplements: none Past cholesterol medications: none Medication compliance: excellent compliance Aspirin: no Recent stressors: no Recurrent headaches: no Visual changes: no Palpitations: no Dyspnea: no Chest pain: no Lower extremity edema: no Dizzy/lightheaded: no  He currently lives with: wife Interim Problems from his last visit: no  Depression Screen done today and results listed below:     04/29/2023    1:12 PM 02/25/2023    2:08 PM 12/27/2022   11:21 AM 10/18/2022    2:13 PM 09/24/2022   11:12 AM  Depression screen PHQ 2/9  Decreased Interest 1 1 0 0 1  Down, Depressed, Hopeless 1 1 0 0 1  PHQ - 2 Score 2 2 0 0 2  Altered sleeping 1 2 1 1 2   Tired, decreased energy 1 1 1 1 1   Change in appetite 1 0 0 0 0  Feeling bad or failure about yourself  0 0 0 0   Trouble concentrating 1 2 1 1 1   Moving slowly or fidgety/restless 1 1 0 0 1  Suicidal thoughts 0 0 0 0 0  PHQ-9 Score 7 8 3 3 7   Difficult doing work/chores Somewhat difficult Not difficult at all Not difficult at all Not difficult at all Somewhat difficult     Past Medical History:  Past Medical History:  Diagnosis Date   Chronic kidney disease    Clotting disorder (HCC)    Heart murmur    Skin cancer     Surgical History:  Past Surgical History:   Procedure Laterality Date   biopsy Right 08/26/2019   temple biopsy - dr.[email or web address]    EYE SURGERY Right    benign growth on right eye    KNEE SURGERY Right    MELANOMA EXCISION Bilateral    lower back     Medications:  Current Outpatient Medications on File Prior to Visit  Medication Sig   ascorbic acid (VITAMIN C) 1000 MG tablet Take by mouth.   Cholecalciferol (VITAMIN D3 PO) Take 1 tablet by mouth daily.   Cyanocobalamin (VITAMIN B-12 ER PO) Take 1,000 mcg by mouth daily at 6 (six) AM.   ELDERBERRY PO Take 1 tablet by mouth daily.   ERIVEDGE 150 MG capsule Take 150 mg by mouth daily.   Grape Seed Extract 50 MG CAPS Take 1,300 mg by mouth daily.    Ipratropium-Albuterol (COMBIVENT) 20-100 MCG/ACT AERS respimat Inhale 1 puff into the lungs every 6 (six) hours.   warfarin (COUMADIN) 2 MG tablet On Saturday and Sunday-take 1 pill a day [along with 5 mg-a total of 7 mg].   warfarin (COUMADIN) 3 MG tablet Take 1 tablet (3 mg total) by mouth daily. As directed on Thursday 11/9.   warfarin (COUMADIN) 5 MG tablet Take 1 tablet (5 mg total) by mouth one time only at 6 PM.  zinc gluconate 50 MG tablet Take by mouth.   Current Facility-Administered Medications on File Prior to Visit  Medication   cyanocobalamin ((VITAMIN B-12)) injection 1,000 mcg    Allergies:  No Active Allergies  Social History:  Social History   Socioeconomic History   Marital status: Married    Spouse name: Not on file   Number of children: Not on file   Years of education: 13 years    Highest education level: Some college, no degree  Occupational History   Occupation: retired  Tobacco Use   Smoking status: Never   Smokeless tobacco: Never  Vaping Use   Vaping Use: Never used  Substance and Sexual Activity   Alcohol use: No   Drug use: No   Sexual activity: Not Currently  Other Topics Concern   Not on file  Social History Narrative   [employer]    [place]        Golfing 2 times a week, alk 3 times a week    Social Determinants of Health   Financial Resource Strain: Low Risk  (09/24/2022)   Overall Financial Resource Strain (CARDIA)    Difficulty of Paying Living Expenses: Not hard at all  Food Insecurity: No Food Insecurity (09/24/2022)   Hunger Vital Sign    Worried About Running Out of Food in the Last Year: Never true    Ran Out of Food in the Last Year: Never true  Transportation Needs: No Transportation Needs (09/24/2022)   PRAPARE - Administrator, Civil Service (Medical): No    Lack of Transportation (Non-Medical): No  Physical Activity: Insufficiently Active (09/24/2022)   Exercise Vital Sign    Days of Exercise per Week: 2 days    Minutes of Exercise per Session: 20 min  Stress: No Stress Concern Present (09/24/2022)   [employer] of Occupational Health - Occupational Stress Questionnaire    Feeling of Stress : Not at all  Social Connections: Moderately Integrated (09/24/2022)   Social Connection and Isolation Panel [NHANES]    Frequency of Communication with Friends and Family: Once a week    Frequency of Social Gatherings with Friends and Family: Once a week    Attends Religious Services: More than 4 times per year    Active Member of [employer] or Organizations: No    Attends Engineer, structural: More than 4 times per year    Marital Status: Married  Catering manager Violence: Not At Risk (09/24/2022)   Humiliation, Afraid, Rape, and Kick questionnaire    Fear of Current or Ex-Partner: No    Emotionally Abused: No    Physically Abused: No    Sexually Abused: No   Social History   Tobacco Use  Smoking Status Never  Smokeless Tobacco Never   Social History   Substance and Sexual Activity  Alcohol Use No    Family History:  Family History  Problem Relation Age of Onset   Stroke Mother    Cerebral aneurysm Father     Past medical history, surgical history, medications, allergies, family history  and social history reviewed with patient today and changes made to appropriate areas of the chart.   Review of Systems  Constitutional: Negative.   HENT: Negative.         Runny nose  Eyes: Negative.   Respiratory:  Positive for cough. Negative for hemoptysis, sputum production, shortness of breath and wheezing.   Cardiovascular: Negative.   Gastrointestinal: Negative.   Genitourinary:  Negative.   Musculoskeletal: Negative.   Skin: Negative.   Neurological: Negative.   Endo/Heme/Allergies:  Positive for environmental allergies and polydipsia. Bruises/bleeds easily.  Psychiatric/Behavioral: Negative.     All other ROS negative except what is listed above and in the HPI.      Objective:    BP 125/64   Pulse 66   Temp 98.6 F (37 C) (Oral)   Wt 158 lb 14.4 oz (72.1 kg)   SpO2 94%   BMI 24.89 kg/m   Wt Readings from Last 3 Encounters:  04/29/23 158 lb 14.4 oz (72.1 kg)  03/06/23 160 lb 11.2 oz (72.9 kg)  02/25/23 162 lb 3.2 oz (73.6 kg)    Physical Exam Vitals and nursing note reviewed.  Constitutional:      General: He is not in acute distress.    Appearance: Normal appearance. He is normal weight. He is not ill-appearing, toxic-appearing or diaphoretic.  HENT:     Head: Normocephalic and atraumatic.     Right Ear: Tympanic membrane, ear canal and external ear normal. There is no impacted cerumen.     Left Ear: Tympanic membrane, ear canal and external ear normal. There is no impacted cerumen.     Nose: Nose normal. No congestion or rhinorrhea.     Mouth/Throat:     Mouth: Mucous membranes are moist.     Pharynx: Oropharynx is clear. No oropharyngeal exudate or posterior oropharyngeal erythema.  Eyes:     General: No scleral icterus.       Right eye: No discharge.        Left eye: No discharge.     Extraocular Movements: Extraocular movements intact.     Conjunctiva/sclera: Conjunctivae normal.     Pupils: Pupils are equal, round, and reactive to light.  Neck:      Vascular: No carotid bruit.  Cardiovascular:     Rate and Rhythm: Normal rate and regular rhythm.     Pulses: Normal pulses.     Heart sounds: No murmur heard.    No friction rub. No gallop.  Pulmonary:     Effort: Pulmonary effort is normal. No respiratory distress.     Breath sounds: Normal breath sounds. No stridor. No wheezing, rhonchi or rales.  Chest:     Chest wall: No tenderness.  Abdominal:     General: Abdomen is flat. Bowel sounds are normal. There is no distension.     Palpations: Abdomen is soft. There is no mass.     Tenderness: There is no abdominal tenderness. There is no right CVA tenderness, left CVA tenderness, guarding or rebound.     Hernia: No hernia is present.  Genitourinary:    Comments: Genital exam deferred with shared decision making Musculoskeletal:        General: No swelling, tenderness, deformity or signs of injury.     Cervical back: Normal range of motion and neck supple. No rigidity. No muscular tenderness.     Right lower leg: No edema.     Left lower leg: No edema.  Lymphadenopathy:     Cervical: No cervical adenopathy.  Skin:    General: Skin is warm and dry.     Capillary Refill: Capillary refill takes less than 2 seconds.     Coloration: Skin is not jaundiced or pale.     Findings: No bruising, erythema, lesion or rash.  Neurological:     General: No focal deficit present.     Mental Status: He is alert and  oriented to person, place, and time.     Cranial Nerves: No cranial nerve deficit.     Sensory: No sensory deficit.     Motor: No weakness.     Coordination: Coordination normal.     Gait: Gait normal.     Deep Tendon Reflexes: Reflexes normal.  Psychiatric:        Mood and Affect: Mood normal.        Behavior: Behavior normal.        Thought Content: Thought content normal.        Judgment: Judgment normal.     Results for orders placed or performed in visit on 04/03/23  Protime-INR  Result Value Ref Range   Prothrombin  Time 26.2 (H) 11.4 - 15.2 seconds   INR 2.4 (H) 0.8 - 1.2      Assessment & Plan:   Problem List Items Addressed This Visit       Cardiovascular and Mediastinum   Senile purpura (HCC)    Reassured patient. Continue to monitor.       Relevant Medications   amLODipine (NORVASC) 2.5 MG tablet   RESOLVED: Primary hypertension   Relevant Medications   amLODipine (NORVASC) 2.5 MG tablet     Genitourinary   CKD (chronic kidney disease) stage 4, GFR 15-29 ml/min (HCC)    Under good control on current regimen. Continue current regimen. Continue to monitor. Call with any concerns.        Benign hypertensive renal disease    Under good control on current regimen. Continue current regimen. Continue to monitor. Call with any concerns. Refills given. Labs drawn today.          Hematopoietic and Hemostatic   Acquired thrombophilia (HCC)    Reassured patient. Continue to monitor.         Other   B12 deficiency    Reassured patient. Continue to monitor.       Other Visit Diagnoses     Routine general medical examination at a health care facility    -  Primary   Vaccines up to date. Screening labs checked last visit. Continue diet and exercise. Call with any concerns.        LABORATORY TESTING:  Health maintenance labs ordered today as discussed above.   IMMUNIZATIONS:   - Tdap: Tetanus vaccination status reviewed: last tetanus booster within 10 years. - Influenza: Postponed to flu season - Pneumovax: Up to date - Prevnar: Up to date - COVID: Up to date - HPV: Refused - Shingrix vaccine: Refused  SCREENING: - Colonoscopy: Not applicable  Discussed with patient purpose of the colonoscopy is to detect colon cancer at curable precancerous or early stages   PATIENT COUNSELING:    Sexuality: Discussed sexually transmitted diseases, partner selection, use of condoms, avoidance of unintended pregnancy  and contraceptive alternatives.   Advised to avoid cigarette  smoking.  I discussed with the patient that most people either abstain from alcohol or drink within safe limits (<=14/week and <=4 drinks/occasion for males, <=7/weeks and <= 3 drinks/occasion for females) and that the risk for alcohol disorders and other health effects rises proportionally with the number of drinks per week and how often a drinker exceeds daily limits.  Discussed cessation/primary prevention of drug use and availability of treatment for abuse.   Diet: Encouraged to adjust caloric intake to maintain  or achieve ideal body weight, to reduce intake of dietary saturated fat and total fat, to limit sodium intake by avoiding high sodium  foods and not adding table salt, and to maintain adequate dietary potassium and calcium preferably from fresh fruits, vegetables, and low-fat dairy products.    stressed the importance of regular exercise  Injury prevention: Discussed safety belts, safety helmets, smoke detector, smoking near bedding or upholstery.   Dental health: Discussed importance of regular tooth brushing, flossing, and dental visits.   Follow up plan: NEXT PREVENTATIVE PHYSICAL DUE IN 1 YEAR. Return in about 3 months (around 07/29/2023) for 1 month nurse only for B12, then 3 months with me.

## 2023-04-29 NOTE — Assessment & Plan Note (Signed)
Under good control on current regimen. Continue current regimen. Continue to monitor. Call with any concerns. Refills given. Labs drawn today.   

## 2023-05-08 ENCOUNTER — Inpatient Hospital Stay: Payer: Medicare Other | Attending: Internal Medicine

## 2023-05-08 DIAGNOSIS — I82512 Chronic embolism and thrombosis of left femoral vein: Secondary | ICD-10-CM | POA: Insufficient documentation

## 2023-05-08 DIAGNOSIS — C44310 Basal cell carcinoma of skin of unspecified parts of face: Secondary | ICD-10-CM | POA: Diagnosis present

## 2023-05-08 LAB — PROTIME-INR
INR: 2.7 — ABNORMAL HIGH (ref 0.8–1.2)
Prothrombin Time: 28.5 seconds — ABNORMAL HIGH (ref 11.4–15.2)

## 2023-05-29 ENCOUNTER — Ambulatory Visit (INDEPENDENT_AMBULATORY_CARE_PROVIDER_SITE_OTHER): Payer: Medicare Other

## 2023-05-29 DIAGNOSIS — E538 Deficiency of other specified B group vitamins: Secondary | ICD-10-CM

## 2023-06-04 ENCOUNTER — Other Ambulatory Visit: Payer: Self-pay

## 2023-06-04 DIAGNOSIS — I82512 Chronic embolism and thrombosis of left femoral vein: Secondary | ICD-10-CM

## 2023-06-05 ENCOUNTER — Inpatient Hospital Stay: Payer: Medicare Other | Attending: Internal Medicine

## 2023-06-05 DIAGNOSIS — C44310 Basal cell carcinoma of skin of unspecified parts of face: Secondary | ICD-10-CM | POA: Insufficient documentation

## 2023-06-05 DIAGNOSIS — I82512 Chronic embolism and thrombosis of left femoral vein: Secondary | ICD-10-CM | POA: Insufficient documentation

## 2023-06-05 LAB — PROTIME-INR
INR: 2.5 — ABNORMAL HIGH (ref 0.8–1.2)
Prothrombin Time: 27.3 seconds — ABNORMAL HIGH (ref 11.4–15.2)

## 2023-07-01 ENCOUNTER — Encounter: Payer: Self-pay | Admitting: Nurse Practitioner

## 2023-07-01 ENCOUNTER — Ambulatory Visit (INDEPENDENT_AMBULATORY_CARE_PROVIDER_SITE_OTHER): Payer: Medicare Other

## 2023-07-01 ENCOUNTER — Ambulatory Visit (INDEPENDENT_AMBULATORY_CARE_PROVIDER_SITE_OTHER): Payer: Medicare Other | Admitting: Nurse Practitioner

## 2023-07-01 VITALS — BP 140/70 | HR 62 | Ht 67.0 in | Wt 155.4 lb

## 2023-07-01 DIAGNOSIS — W19XXXA Unspecified fall, initial encounter: Secondary | ICD-10-CM

## 2023-07-01 DIAGNOSIS — R42 Dizziness and giddiness: Secondary | ICD-10-CM

## 2023-07-01 DIAGNOSIS — E538 Deficiency of other specified B group vitamins: Secondary | ICD-10-CM

## 2023-07-01 DIAGNOSIS — G9332 Myalgic encephalomyelitis/chronic fatigue syndrome: Secondary | ICD-10-CM

## 2023-07-01 DIAGNOSIS — U099 Post covid-19 condition, unspecified: Secondary | ICD-10-CM

## 2023-07-01 DIAGNOSIS — Z85820 Personal history of malignant melanoma of skin: Secondary | ICD-10-CM | POA: Insufficient documentation

## 2023-07-01 MED ORDER — CYANOCOBALAMIN 1000 MCG/ML IJ SOLN
1000.0000 ug | Freq: Once | INTRAMUSCULAR | Status: AC
Start: 2023-07-01 — End: 2023-07-01
  Administered 2023-07-01: 1000 ug via INTRAMUSCULAR

## 2023-07-01 NOTE — Assessment & Plan Note (Signed)
Increased fatigue since recent fall -- refer to dizziness plan of care for further.  Monitor weight closely as is trending down since March.

## 2023-07-01 NOTE — Assessment & Plan Note (Addendum)
Ongoing since fall on 06/29/23.  Denies hitting head during fall.  Overall no red flags on neuro exam today.  Discussed with family, would benefit from CT head w/o contrast (kidney disease) to further assess -- ?TIAs vs orthostatic changes vs vasovagal event.  Would benefit imaging to further assess.  Obtain labs today: CBC, CMP, TSH, UA -- will rule out infection.  Discussed with spouse and patient at times infection is cause of falls with aging. - Continue current treatment at home to skin tears as these appear to be healing well, recommend abx ointment and Vaseline application to tear on left shin.

## 2023-07-01 NOTE — Progress Notes (Signed)
BP (!) 140/70 (BP Location: Left Arm, Patient Position: Sitting, Cuff Size: Normal)   Pulse 62   Ht 5\' 7"  (1.702 m)   Wt 155 lb 6.4 oz (70.5 kg)   SpO2 98%   BMI 24.34 kg/m    Subjective:    Patient ID: Gregg Thomas, male    DOB: 10-13-1931, 89 y.o.   MRN: [id number]  HPI: Gregg Thomas is a 89 y.o. male  Chief Complaint  Patient presents with   Fall   Dizziness   Fatigue    Patient says last week, he did not feel good. Patient was coming from using the bathroom and his wife says he fell, and she checked his blood pressure and it was 133/66. Patient says she gave him some orange juice and his vomited back up. Patient has been very off balance and dizzy since Saturday. Patient says he has eaten recently and kept it all down.    Wife is present at bedside to assist with HPI.  DIZZINESS Had a fall on Saturday morning, he does not recall what happened at time.  He was going to bathroom to pee, coming out of bathroom when he fell.  When he fell he was on left side -- skin tears present.  Denies any pain.  Some slight increased difficulty walking since fall, is using walker. Had PT in past and can not restart for a few weeks.  Has been more fatigued since fall -- was somewhat fatigued before but this has increased.  Gets B12 shots.  After the fall he was nauseous, wife tried to provide him orange juice but he threw this back up.  His wife reports he has been complaining of dizziness since Saturday.  Checks BP at home daily 130-140/60-80 range at home.  Currently has known balance issues and memory changes (which started after Covid), not seeing neurology at this time.  Taking Aricept. Duration: days Description of symptoms: off kilter Duration of episode: minutes Dizziness frequency:  has had similar in past, but not on regular basis Provoking factors: none Aggravating factors:  none Triggered by rolling over in bed: no Triggered by bending over: yes Aggravated  by head movement: no Aggravated by exertion, coughing, loud noises: no Recent head injury: no -- wife reports no sign of head injury after fall Recent or current viral symptoms: no History of vasovagal episodes: no Nausea: yes Vomiting: yes Tinnitus: no Hearing loss: no Aural fullness: no Headache: no Photophobia/phonophobia: no Unsteady gait: yes Postural instability: no Diplopia, dysarthria, dysphagia or weakness: increased weakness since fall Related to exertion: no Pallor: no Diaphoresis: no Dyspnea: no Chest pain: no   Relevant past medical, surgical, family and social history reviewed and updated as indicated. Interim medical history since our last visit reviewed. Allergies and medications reviewed and updated.  Review of Systems  Constitutional:  Positive for fatigue. Negative for activity change, appetite change, diaphoresis and fever.  Respiratory:  Negative for cough, chest tightness, shortness of breath and wheezing.   Cardiovascular:  Negative for chest pain, palpitations and leg swelling.  Gastrointestinal: Negative.  Negative for vomiting.  Endocrine: Negative.   Neurological:  Positive for dizziness and weakness. Negative for tremors, syncope, facial asymmetry, speech difficulty, light-headedness, numbness and headaches.  Psychiatric/Behavioral: Negative.      Per HPI unless specifically indicated above     Objective:    BP (!) 140/70 (BP Location: Left Arm, Patient Position: Sitting, Cuff Size: Normal)   Pulse 62   Ht  5\' 7"  (1.702 m)   Wt 155 lb 6.4 oz (70.5 kg)   SpO2 98%   BMI 24.34 kg/m   Wt Readings from Last 3 Encounters:  07/01/23 155 lb 6.4 oz (70.5 kg)  04/29/23 158 lb 14.4 oz (72.1 kg)  03/06/23 160 lb 11.2 oz (72.9 kg)    Physical Exam Vitals and nursing note reviewed.  Constitutional:      General: He is awake. He is not in acute distress.    Appearance: He is well-developed and well-groomed. He is not ill-appearing or toxic-appearing.   HENT:     Head: Normocephalic.     Right Ear: Hearing, tympanic membrane, ear canal and external ear normal.     Left Ear: Hearing, tympanic membrane, ear canal and external ear normal.     Nose: No rhinorrhea.     Right Sinus: No maxillary sinus tenderness or frontal sinus tenderness.     Left Sinus: No maxillary sinus tenderness or frontal sinus tenderness.     Mouth/Throat:     Mouth: Mucous membranes are moist.     Pharynx: No pharyngeal swelling, oropharyngeal exudate or posterior oropharyngeal erythema.  Eyes:     General: Lids are normal.     Extraocular Movements: Extraocular movements intact.     Conjunctiva/sclera: Conjunctivae normal.     Visual Fields: Right eye visual fields normal and left eye visual fields normal.  Neck:     Thyroid: No thyromegaly.     Vascular: No carotid bruit.  Cardiovascular:     Rate and Rhythm: Normal rate and regular rhythm.     Heart sounds: Normal heart sounds. No murmur heard.    No gallop.  Pulmonary:     Effort: Pulmonary effort is normal. No accessory muscle usage or respiratory distress.     Breath sounds: No decreased breath sounds, wheezing or rhonchi.  Abdominal:     General: Bowel sounds are normal. There is no distension.     Palpations: Abdomen is soft.     Tenderness: There is no abdominal tenderness.  Musculoskeletal:     Right shoulder: Normal. No swelling, tenderness or crepitus. Normal range of motion. Normal strength.     Left shoulder: Normal. No swelling, tenderness or crepitus. Normal range of motion. Normal strength.     Cervical back: Full passive range of motion without pain.     Right hip: Normal.     Left hip: Normal.     Right lower leg: No tenderness. No edema.     Left lower leg: No tenderness. No edema.  Lymphadenopathy:     Cervical: No cervical adenopathy.  Skin:    General: Skin is warm.     Capillary Refill: Capillary refill takes less than 2 seconds.          Comments: Scattered bruising to  bilateral upper extremities and shins.  Neurological:     Mental Status: He is alert.     Cranial Nerves: Cranial nerves 2-12 are intact.     Motor: Motor function is intact.     Coordination: Coordination is intact. Romberg sign negative.     Gait: Gait is intact.     Deep Tendon Reflexes: Reflexes are normal and symmetric.     Reflex Scores:      Brachioradialis reflexes are 2+ on the right side and 2+ on the left side.      Patellar reflexes are 2+ on the right side and 2+ on the left side.  Comments: Is aware of year, month, location.    Psychiatric:        Attention and Perception: Attention normal.        Mood and Affect: Mood normal.        Speech: Speech normal.        Behavior: Behavior normal. Behavior is cooperative.        Thought Content: Thought content normal.        Cognition and Memory: Memory is impaired.     Results for orders placed or performed in visit on 06/05/23  Protime-INR  Result Value Ref Range   Prothrombin Time 27.3 (H) 11.4 - 15.2 seconds   INR 2.5 (H) 0.8 - 1.2      Assessment & Plan:   Problem List Items Addressed This Visit       Other   Dizziness - Primary    Ongoing since fall on 06/29/23.  Denies hitting head during fall.  Overall no red flags on neuro exam today.  Discussed with family, would benefit from CT head w/o contrast (kidney disease) to further assess -- ?TIAs vs orthostatic changes vs vasovagal event.  Would benefit imaging to further assess.  Obtain labs today: CBC, CMP, TSH, UA -- will rule out infection.  Discussed with spouse and patient at times infection is cause of falls with aging.      Relevant Orders   CT HEAD WO CONTRAST ( )   CBC with Differential/Platelet   Comprehensive metabolic panel   TSH   Urinalysis, Routine w reflex microscopic   Fall    Ongoing since fall on 06/29/23.  Denies hitting head during fall.  Overall no red flags on neuro exam today.  Discussed with family, would benefit from CT head w/o  contrast (kidney disease) to further assess -- ?TIAs vs orthostatic changes vs vasovagal event.  Would benefit imaging to further assess.  Obtain labs today: CBC, CMP, TSH, UA -- will rule out infection.  Discussed with spouse and patient at times infection is cause of falls with aging. - Continue current treatment at home to skin tears as these appear to be healing well, recommend abx ointment and Vaseline application to tear on left shin.      Post-COVID chronic fatigue    Increased fatigue since recent fall -- refer to dizziness plan of care for further.  Monitor weight closely as is trending down since March.         Time: 25 minutes, >50% spent counseling/or care coordination   Follow up plan: Return in about 1 week (around 07/08/2023) for Dizziness.

## 2023-07-01 NOTE — Patient Instructions (Signed)
Dizziness Dizziness is a common problem. It makes you feel unsteady or light-headed. You may feel like you are about to pass out (faint). Dizziness can lead to getting hurt if you stumble or fall. Dizziness can be caused by many things, including: Medicines. Not having enough water in your body (dehydration). Illness. Follow these instructions at home: Eating and drinking  Drink enough fluid to keep your pee (urine) pale yellow. This helps to keep you from getting dehydrated. Try to drink more clear fluids, such as water. Do not drink alcohol. Limit how much caffeine you drink or eat, if your doctor tells you to do that. Limit how much salt (sodium) you drink or eat, if your doctor tells you to do that. Activity  Avoid making quick movements. Stand up slowly from sitting in a chair, and steady yourself until you feel okay. In the morning, first sit up on the side of the bed. When you feel okay, stand up slowly while you hold onto something. Do this until you know that your balance is okay. If you need to stand in one place for a long time, move your legs often. Tighten and relax the muscles in your legs while you are standing. Do not drive or use machinery if you feel dizzy. Avoid bending down if you feel dizzy. Place items in your home so you can reach them easily without leaning over. Lifestyle Do not smoke or use any products that contain nicotine or tobacco. If you need help quitting, ask your doctor. Try to lower your stress level. You can do this by using methods such as yoga or meditation. Talk with your doctor if you need help. General instructions Watch your dizziness for any changes. Take over-the-counter and prescription medicines only as told by your doctor. Talk with your doctor if you think that you are dizzy because of a medicine that you are taking. Tell a friend or a family member that you are feeling dizzy. If he or she notices any changes in your behavior, have this  person call your doctor. Keep all follow-up visits. Contact a doctor if: Your dizziness does not go away. Your dizziness or light-headedness gets worse. You feel like you may vomit (are nauseous). You have trouble hearing. You have new symptoms. You are unsteady on your feet. You feel like the room is spinning. You have neck pain or a stiff neck. You have a fever. Get help right away if: You vomit or have watery poop (diarrhea), and you cannot eat or drink anything. You have trouble: Talking. Walking. Swallowing. Using your arms, hands, or legs. You feel generally weak. You are not thinking clearly, or you have trouble forming sentences. A friend or family member may notice this. You have: Chest pain. Pain in your belly (abdomen). Shortness of breath. Sweating. Your vision changes. You are bleeding. You have a very bad headache. These symptoms may be an emergency. Get help right away. Call your local emergency services (911 in the [place] Do not wait to see if the symptoms will go away. Do not drive yourself to the hospital. Summary Dizziness makes you feel unsteady or light-headed. You may feel like you are about to pass out (faint). Drink enough fluid to keep your pee (urine) pale yellow. Do not drink alcohol. Avoid making quick movements if you feel dizzy. Watch your dizziness for any changes. This information is not intended to replace advice given to you by your health care provider. Make sure you discuss any questions   you have with your health care provider. Document Revised: 11/18/2020 Document Reviewed: 11/21/2020 Elsevier Patient Education  2024 Elsevier Inc.  

## 2023-07-01 NOTE — Assessment & Plan Note (Signed)
Ongoing since fall on 06/29/23.  Denies hitting head during fall.  Overall no red flags on neuro exam today.  Discussed with family, would benefit from CT head w/o contrast (kidney disease) to further assess -- ?TIAs vs orthostatic changes vs vasovagal event.  Would benefit imaging to further assess.  Obtain labs today: CBC, CMP, TSH, UA -- will rule out infection.  Discussed with spouse and patient at times infection is cause of falls with aging.

## 2023-07-02 ENCOUNTER — Other Ambulatory Visit: Payer: Medicare Other

## 2023-07-02 LAB — URINALYSIS, ROUTINE W REFLEX MICROSCOPIC
Bilirubin, UA: NEGATIVE
Glucose, UA: NEGATIVE
Ketones, UA: NEGATIVE
Leukocytes,UA: NEGATIVE
Nitrite, UA: NEGATIVE
Specific Gravity, UA: 1.025 (ref 1.005–1.030)
Urobilinogen, Ur: 0.2 mg/dL (ref 0.2–1.0)
pH, UA: 6 (ref 5.0–7.5)

## 2023-07-02 LAB — MICROSCOPIC EXAMINATION
Bacteria, UA: NONE SEEN
Epithelial Cells (non renal): NONE SEEN /hpf (ref 0–10)
WBC, UA: NONE SEEN /hpf (ref 0–5)

## 2023-07-03 ENCOUNTER — Inpatient Hospital Stay: Payer: Medicare Other | Attending: Internal Medicine

## 2023-07-03 ENCOUNTER — Ambulatory Visit: Payer: Medicare Other | Attending: Nurse Practitioner

## 2023-07-03 DIAGNOSIS — C44310 Basal cell carcinoma of skin of unspecified parts of face: Secondary | ICD-10-CM | POA: Diagnosis present

## 2023-07-03 DIAGNOSIS — I82512 Chronic embolism and thrombosis of left femoral vein: Secondary | ICD-10-CM | POA: Insufficient documentation

## 2023-07-03 LAB — PROTIME-INR
INR: 2.9 — ABNORMAL HIGH (ref 0.8–1.2)
Prothrombin Time: 30.5 seconds — ABNORMAL HIGH (ref 11.4–15.2)

## 2023-07-03 LAB — CBC WITH DIFFERENTIAL/PLATELET
Basophils Absolute: 0 10*3/uL (ref 0.0–0.2)
Basos: 1 %
EOS (ABSOLUTE): 0.2 10*3/uL (ref 0.0–0.4)
Eos: 3 %
Hematocrit: 43.5 % (ref 37.5–51.0)
Hemoglobin: 14.2 g/dL (ref 13.0–17.7)
Immature Grans (Abs): 0 10*3/uL (ref 0.0–0.1)
Immature Granulocytes: 0 %
Lymphocytes Absolute: 1.3 10*3/uL (ref 0.7–3.1)
Lymphs: 24 %
MCH: 32.4 pg (ref 26.6–33.0)
MCHC: 32.6 g/dL (ref 31.5–35.7)
MCV: 99 fL — ABNORMAL HIGH (ref 79–97)
Monocytes Absolute: 0.6 10*3/uL (ref 0.1–0.9)
Monocytes: 11 %
Neutrophils Absolute: 3.3 10*3/uL (ref 1.4–7.0)
Neutrophils: 61 %
Platelets: 197 10*3/uL (ref 150–450)
RBC: 4.38 x10E6/uL (ref 4.14–5.80)
RDW: 13.4 % (ref 11.6–15.4)
WBC: 5.4 10*3/uL (ref 3.4–10.8)

## 2023-07-03 LAB — COMPREHENSIVE METABOLIC PANEL
ALT: 30 IU/L (ref 0–44)
AST: 33 IU/L (ref 0–40)
Albumin: 3.8 g/dL (ref 3.7–4.7)
Alkaline Phosphatase: 160 IU/L — ABNORMAL HIGH (ref 44–121)
BUN/Creatinine Ratio: 8 — ABNORMAL LOW (ref 10–24)
BUN: 15 mg/dL (ref 8–27)
Bilirubin Total: 0.5 mg/dL (ref 0.0–1.2)
CO2: 24 mmol/L (ref 20–29)
Calcium: 9.5 mg/dL (ref 8.6–10.2)
Chloride: 107 mmol/L — ABNORMAL HIGH (ref 96–106)
Creatinine, Ser: 1.88 mg/dL — ABNORMAL HIGH (ref 0.76–1.27)
Globulin, Total: 2.7 g/dL (ref 1.5–4.5)
Glucose: 94 mg/dL (ref 70–99)
Potassium: 4.2 mmol/L (ref 3.5–5.2)
Sodium: 146 mmol/L — ABNORMAL HIGH (ref 134–144)
Total Protein: 6.5 g/dL (ref 6.0–8.5)
eGFR: 34 mL/min/{1.73_m2} — ABNORMAL LOW (ref >59)

## 2023-07-03 LAB — TSH: TSH: 4.08 u[IU]/mL (ref 0.450–4.500)

## 2023-07-03 NOTE — Progress Notes (Signed)
Good afternoon, please let Gregg Thomas and his wife know labs have returned: - Urine shows no infection concerns. - Blood counts are stable with no anemia or infection. - Kidney function continues to show stable stage 3b kidney disease with no worsening.   - Salt level is mildly elevated, I recommend try to increase water intake a little and reduce salt intake, we can recheck next visit. - Thyroid level is normal.  We will see what imaging shows to ensure no acute findings concerning for his recent fall.  Any questions? Keep being wonderful!!  Thank you for allowing me to participate in your care.  I appreciate you. Kindest regards, Garan Frappier

## 2023-07-14 ENCOUNTER — Other Ambulatory Visit: Payer: Self-pay | Admitting: Internal Medicine

## 2023-07-14 NOTE — Patient Instructions (Addendum)
Start magnesium 400 MG at night and reduce Gabapentin to 100 MG (one tablet) at night  Fall Prevention in the Home, Adult Falls can cause injuries and can happen to people of all ages. There are many things you can do to make your home safer and to help prevent falls. What actions can I take to prevent falls? General information Use good lighting in all rooms. Make sure to: Replace any light bulbs that burn out. Turn on the lights in dark areas and use night-lights. Keep items that you use often in easy-to-reach places. Lower the shelves around your home if needed. Move furniture so that there are clear paths around it. Do not use throw rugs or other things on the floor that can make you trip. If any of your floors are uneven, fix them. Add color or contrast paint or tape to clearly mark and help you see: Grab bars or handrails. First and last steps of staircases. Where the edge of each step is. If you use a ladder or stepladder: Make sure that it is fully opened. Do not climb a closed ladder. Make sure the sides of the ladder are locked in place. Have someone hold the ladder while you use it. Know where your pets are as you move through your home. What can I do in the bathroom?     Keep the floor dry. Clean up any water on the floor right away. Remove soap buildup in the bathtub or shower. Buildup makes bathtubs and showers slippery. Use non-skid mats or decals on the floor of the bathtub or shower. Attach bath mats securely with double-sided, non-slip rug tape. If you need to sit down in the shower, use a non-slip stool. Install grab bars by the toilet and in the bathtub and shower. Do not use towel bars as grab bars. What can I do in the bedroom? Make sure that you have a light by your bed that is easy to reach. Do not use any sheets or blankets on your bed that hang to the floor. Have a firm chair or bench with side arms that you can use for support when you get dressed. What  can I do in the kitchen? Clean up any spills right away. If you need to reach something above you, use a step stool with a grab bar. Keep electrical cords out of the way. Do not use floor polish or wax that makes floors slippery. What can I do with my stairs? Do not leave anything on the stairs. Make sure that you have a light switch at the top and the bottom of the stairs. Make sure that there are handrails on both sides of the stairs. Fix handrails that are broken or loose. Install non-slip stair treads on all your stairs if they do not have carpet. Avoid having throw rugs at the top or bottom of the stairs. Choose a carpet that does not hide the edge of the steps on the stairs. Make sure that the carpet is firmly attached to the stairs. Fix carpet that is loose or worn. What can I do on the outside of my home? Use bright outdoor lighting. Fix the edges of walkways and driveways and fix any cracks. Clear paths of anything that can make you trip, such as tools or rocks. Add color or contrast paint or tape to clearly mark and help you see anything that might make you trip as you walk through a door, such as a raised step or threshold.  Trim any bushes or trees on paths to your home. Check to see if handrails are loose or broken and that both sides of all steps have handrails. Install guardrails along the edges of any raised decks and porches. Have leaves, snow, or ice cleared regularly. Use sand, salt, or ice melter on paths if you live where there is ice and snow during the winter. Clean up any spills in your garage right away. This includes grease or oil spills. What other actions can I take? Review your medicines with your doctor. Some medicines can cause dizziness or changes in blood pressure, which increase your risk of falling. Wear shoes that: Have a low heel. Do not wear high heels. Have rubber bottoms and are closed at the toe. Feel good on your feet and fit well. Use tools that  help you move around if needed. These include: Canes. Walkers. Scooters. Crutches. Ask your doctor what else you can do to help prevent falls. This may include seeing a physical therapist to learn to do exercises to move better and get stronger. Where to find more information Centers for Disease Control and Prevention, STEADI: TonerPromos.no [employer] on Aging: BaseRingTones.pl National Institute on Aging: BaseRingTones.pl Contact a doctor if: You are afraid of falling at home. You feel weak, drowsy, or dizzy at home. You fall at home. Get help right away if you: Lose consciousness or have trouble moving after a fall. Have a fall that causes a head injury. These symptoms may be an emergency. Get help right away. Call 911. Do not wait to see if the symptoms will go away. Do not drive yourself to the hospital. This information is not intended to replace advice given to you by your health care provider. Make sure you discuss any questions you have with your health care provider. Document Revised: 08/20/2022 Document Reviewed: 08/20/2022 Elsevier Patient Education  2024 [employer].

## 2023-07-15 NOTE — Telephone Encounter (Signed)
Component Ref Range & Units 12 d ago 1 mo ago 2 mo ago 3 mo ago 4 mo ago 5 mo ago 6 mo ago  Prothrombin Time 11.4 - 15.2 seconds 30.5 High  27.3 High  28.5 High  26.2 High  24.7 High  29.1 High  28.4 High   INR 0.8 - 1.2 2.9 High  2.5 High  CM 2.7 High  CM 2.4 High  CM 2.3 High  CM 2.8 High  CM 2.7 High

## 2023-07-17 ENCOUNTER — Ambulatory Visit: Payer: Medicare Other | Admitting: Nurse Practitioner

## 2023-07-17 ENCOUNTER — Encounter: Payer: Self-pay | Admitting: Nurse Practitioner

## 2023-07-17 VITALS — BP 119/73 | HR 52 | Temp 97.6°F | Ht 67.01 in | Wt 155.6 lb

## 2023-07-17 DIAGNOSIS — R42 Dizziness and giddiness: Secondary | ICD-10-CM

## 2023-07-17 NOTE — Progress Notes (Signed)
BP 119/73   Pulse (!) 52   Temp 97.6 F (36.4 C) (Oral)   Ht 5' 7.01" (1.702 m)   Wt 155 lb 9.6 oz (70.6 kg)   SpO2 98%   BMI 24.36 kg/m    Subjective:    Patient ID: Gregg Thomas, male    DOB: 03-19-1931, 89 y.o.   MRN: [id number]  HPI: Gregg Thomas is a 89 y.o. male  Chief Complaint  Patient presents with   Fatigue    Following up from about 2 weeks ago and patient is feeling about the same   Wife is present at bedside to assist with HPI.  DIZZINESS/FATIGUE Follow-up for fatigue and dizziness, seen last on 07/01/23. Labs were reassuring and at baseline at visit.  Has not attended CT for post fall assessment.  Dizziness has been improved and no further falls.  Continues to not hydrate well -- wife tries but he refuses.  Checks BP at home daily 130-140/60-80 range at home.  Has been more fatigued since fall -- currently takes Gabapentin, Donepezil, and Singulair.  Gets B12 shots.    Currently has known balance issues and memory changes (which started after Covid), not seeing neurology at this time -- Physical therapy is reinstated in August and will continue.  Is riding bike at home and going for walks down street if cooler.   Duration: improving Nausea: yes Vomiting: yes Photophobia/phonophobia: no Unsteady gait: yes Postural instability: no Diplopia, dysarthria, dysphagia or weakness: no Related to exertion: no Pallor: no Diaphoresis: no Dyspnea: no Chest pain: no     07/17/2023    3:54 PM 07/17/2023    3:44 PM 04/29/2023    1:12 PM 02/25/2023    2:08 PM 12/27/2022   11:21 AM  Depression screen PHQ 2/9  Decreased Interest 1 1 1 1  0  Down, Depressed, Hopeless 1 1 1 1  0  PHQ - 2 Score 2 2 2 2  0  Altered sleeping 2 2 1 2 1   Tired, decreased energy 2 2 1 1 1   Change in appetite 0 0 1 0 0  Feeling bad or failure about yourself  0 0 0 0 0  Trouble concentrating 2 2 1 2 1   Moving slowly or fidgety/restless 1 1 1 1  0  Suicidal thoughts 0 0 0 0 0   PHQ-9 Score 9 9 7 8 3   Difficult doing work/chores Somewhat difficult Somewhat difficult Somewhat difficult Not difficult at all Not difficult at all       07/17/2023    3:54 PM 07/17/2023    3:45 PM 04/29/2023    1:13 PM 02/25/2023    2:09 PM  GAD 7 : Generalized Anxiety Score  Nervous, Anxious, on Edge 1 1 1  0  Control/stop worrying 1 1 0 0  Worry too much - different things 1 1 1  0  Trouble relaxing 1 1 1 1   Restless 1 1 0 0  Easily annoyed or irritable 1 1 0 0  Afraid - awful might happen 0 0 0 0  Total GAD 7 Score 6 6 3 1   Anxiety Difficulty Not difficult at all Not difficult at all Somewhat difficult Not difficult at all   Relevant past medical, surgical, family and social history reviewed and updated as indicated. Interim medical history since our last visit reviewed. Allergies and medications reviewed and updated.  Review of Systems  Constitutional:  Positive for fatigue. Negative for activity change, appetite change, diaphoresis and fever.  Respiratory:  Negative for cough, chest tightness, shortness of breath and wheezing.   Cardiovascular:  Negative for chest pain, palpitations and leg swelling.  Gastrointestinal: Negative.   Endocrine: Negative.   Neurological: Negative.   Psychiatric/Behavioral: Negative.      Per HPI unless specifically indicated above     Objective:    BP 119/73   Pulse (!) 52   Temp 97.6 F (36.4 C) (Oral)   Ht 5' 7.01" (1.702 m)   Wt 155 lb 9.6 oz (70.6 kg)   SpO2 98%   BMI 24.36 kg/m   Wt Readings from Last 3 Encounters:  07/17/23 155 lb 9.6 oz (70.6 kg)  07/01/23 155 lb 6.4 oz (70.5 kg)  04/29/23 158 lb 14.4 oz (72.1 kg)    Physical Exam Vitals and nursing note reviewed.  Constitutional:      General: He is awake. He is not in acute distress.    Appearance: He is well-developed and well-groomed. He is not ill-appearing or toxic-appearing.  HENT:     Head: Normocephalic.     Right Ear: Hearing, tympanic membrane, ear canal  and external ear normal.     Left Ear: Hearing, tympanic membrane, ear canal and external ear normal.     Nose: No rhinorrhea.     Right Sinus: No maxillary sinus tenderness or frontal sinus tenderness.     Left Sinus: No maxillary sinus tenderness or frontal sinus tenderness.     Mouth/Throat:     Mouth: Mucous membranes are moist.     Pharynx: No pharyngeal swelling, oropharyngeal exudate or posterior oropharyngeal erythema.  Eyes:     General: Lids are normal.     Extraocular Movements: Extraocular movements intact.     Conjunctiva/sclera: Conjunctivae normal.     Visual Fields: Right eye visual fields normal and left eye visual fields normal.  Neck:     Thyroid: No thyromegaly.     Vascular: No carotid bruit.  Cardiovascular:     Rate and Rhythm: Normal rate and regular rhythm.     Heart sounds: Normal heart sounds. No murmur heard.    No gallop.  Pulmonary:     Effort: Pulmonary effort is normal. No accessory muscle usage or respiratory distress.     Breath sounds: No decreased breath sounds, wheezing or rhonchi.  Abdominal:     General: Bowel sounds are normal. There is no distension.     Palpations: Abdomen is soft.     Tenderness: There is no abdominal tenderness.  Musculoskeletal:     Right shoulder: Normal. No swelling, tenderness or crepitus. Normal range of motion. Normal strength.     Left shoulder: Normal. No swelling, tenderness or crepitus. Normal range of motion. Normal strength.     Cervical back: Full passive range of motion without pain.     Right hip: Normal.     Left hip: Normal.     Right lower leg: No tenderness. No edema.     Left lower leg: No tenderness. No edema.  Lymphadenopathy:     Cervical: No cervical adenopathy.  Skin:    General: Skin is warm.     Capillary Refill: Capillary refill takes less than 2 seconds.     Comments: Scattered bruising to bilateral upper extremities and shins.  Neurological:     Mental Status: He is alert.     Cranial  Nerves: Cranial nerves 2-12 are intact.     Motor: Motor function is intact.     Coordination: Coordination is intact. Romberg  sign negative.     Gait: Gait is intact.     Deep Tendon Reflexes: Reflexes are normal and symmetric.     Reflex Scores:      Brachioradialis reflexes are 2+ on the right side and 2+ on the left side.      Patellar reflexes are 2+ on the right side and 2+ on the left side.    Comments: Reports year as 2023, month as April.  Able to state location.  Psychiatric:        Attention and Perception: Attention normal.        Mood and Affect: Mood normal.        Speech: Speech normal.        Behavior: Behavior normal. Behavior is cooperative.        Thought Content: Thought content normal.        Cognition and Memory: Memory is impaired.    Results for orders placed or performed in visit on 07/03/23  Protime-INR  Result Value Ref Range   Prothrombin Time 30.5 (H) 11.4 - 15.2 seconds   INR 2.9 (H) 0.8 - 1.2      Assessment & Plan:   Problem List Items Addressed This Visit       Other   Dizziness - Primary    Acute and improved at this time.  Continues to have some fatigue.  Will trial reducing his Gabapentin to 100 MG at night and adding on Magnesium 400 MG which may offer benefit to leg cramps and reduce fatigue the next day.  Highly recommend increased fluid intake -- ensuring at least 6 oz every 4 hours which may help fatigue as well.  ?if fatigue related to memory changes as well, which can be seen with progression.      Follow up plan: Return for Return on August 1st with Dr. Shela Commons as scheduled.

## 2023-07-17 NOTE — Assessment & Plan Note (Signed)
Acute and improved at this time.  Continues to have some fatigue.  Will trial reducing his Gabapentin to 100 MG at night and adding on Magnesium 400 MG which may offer benefit to leg cramps and reduce fatigue the next day.  Highly recommend increased fluid intake -- ensuring at least 6 oz every 4 hours which may help fatigue as well.  ?if fatigue related to memory changes as well, which can be seen with progression.

## 2023-08-01 ENCOUNTER — Encounter: Payer: Self-pay | Admitting: Family Medicine

## 2023-08-01 ENCOUNTER — Ambulatory Visit: Payer: Medicare Other | Admitting: Family Medicine

## 2023-08-01 VITALS — BP 122/70 | HR 54 | Temp 97.6°F | Wt 155.8 lb

## 2023-08-01 DIAGNOSIS — R29898 Other symptoms and signs involving the musculoskeletal system: Secondary | ICD-10-CM

## 2023-08-01 DIAGNOSIS — R42 Dizziness and giddiness: Secondary | ICD-10-CM | POA: Diagnosis not present

## 2023-08-01 DIAGNOSIS — R2689 Other abnormalities of gait and mobility: Secondary | ICD-10-CM | POA: Diagnosis not present

## 2023-08-01 DIAGNOSIS — E538 Deficiency of other specified B group vitamins: Secondary | ICD-10-CM | POA: Diagnosis not present

## 2023-08-01 MED ORDER — CYANOCOBALAMIN 1000 MCG/ML IJ SOLN
1000.0000 ug | Freq: Once | INTRAMUSCULAR | Status: AC
Start: 2023-08-01 — End: 2023-08-01
  Administered 2023-08-01: 1000 ug via INTRAMUSCULAR

## 2023-08-01 MED ORDER — GABAPENTIN 100 MG PO CAPS: 100.0000 mg | ORAL_CAPSULE | Freq: Every day | ORAL | Status: DC

## 2023-08-01 NOTE — Assessment & Plan Note (Signed)
B12 shot given today  

## 2023-08-01 NOTE — Assessment & Plan Note (Signed)
Doing significantly better. No more dizziness. Continue lower dose of gabapentin. Call with any concerns. Continue to monitor.

## 2023-08-01 NOTE — Patient Instructions (Signed)
Only take 1 of the gabapentin at bedtime for 100mg 

## 2023-08-01 NOTE — Assessment & Plan Note (Signed)
Will get him back in for PT. Call with any concerns. Referral to home health placed today.

## 2023-08-01 NOTE — Progress Notes (Signed)
BP 122/70   Pulse (!) 54   Temp 97.6 F (36.4 C) (Oral)   Wt 155 lb 12.8 oz (70.7 kg)   SpO2 94%   BMI 24.40 kg/m    Subjective:    Patient ID: Gregg Thomas, male    DOB: September 23, 1931, 89 y.o.   MRN: [id number]  HPI: Gregg Thomas is a 89 y.o. male  Chief Complaint  Patient presents with   Hyperlipidemia   Hypertension   Has been feeling a lot better with his dizziness. Since he cut down to the 100mg  on the gabapentin, he has not had as much dizziness. He had to stop his magnesium due to diarrhea. He notes that he has still be having some issues with balance. He has had falls, but none since his last visit with Jolene. He would like to do PT at home again.  HYPERTENSION / HYPERLIPIDEMIA Satisfied with current treatment? yes Duration of hypertension: chronic BP monitoring frequency: not checking BP medication side effects: no Past BP meds: amlodipine Duration of hyperlipidemia: chronic Cholesterol medication side effects: N/A Cholesterol supplements: grapeseed  Past cholesterol medications: none Medication compliance: excellent compliance Aspirin: no Recent stressors: no Recurrent headaches: no Visual changes: no Palpitations: no Dyspnea: no Chest pain: no Lower extremity edema: no Dizzy/lightheaded: no  Relevant past medical, surgical, family and social history reviewed and updated as indicated. Interim medical history since our last visit reviewed. Allergies and medications reviewed and updated.  Review of Systems  Constitutional: Negative.   Respiratory: Negative.    Cardiovascular: Negative.   Gastrointestinal: Negative.   Musculoskeletal: Negative.   Neurological:  Positive for dizziness, weakness and light-headedness. Negative for tremors, seizures, syncope, facial asymmetry, speech difficulty, numbness and headaches.  Psychiatric/Behavioral: Negative.      Per HPI unless specifically indicated above     Objective:    BP 122/70    Pulse (!) 54   Temp 97.6 F (36.4 C) (Oral)   Wt 155 lb 12.8 oz (70.7 kg)   SpO2 94%   BMI 24.40 kg/m   Wt Readings from Last 3 Encounters:  08/01/23 155 lb 12.8 oz (70.7 kg)  07/17/23 155 lb 9.6 oz (70.6 kg)  07/01/23 155 lb 6.4 oz (70.5 kg)    Physical Exam Vitals and nursing note reviewed.  Constitutional:      General: He is not in acute distress.    Appearance: Normal appearance. He is normal weight. He is not ill-appearing, toxic-appearing or diaphoretic.  HENT:     Head: Normocephalic and atraumatic.     Right Ear: External ear normal.     Left Ear: External ear normal.     Nose: Nose normal.     Mouth/Throat:     Mouth: Mucous membranes are moist.     Pharynx: Oropharynx is clear.  Eyes:     General: No scleral icterus.       Right eye: No discharge.        Left eye: No discharge.     Extraocular Movements: Extraocular movements intact.     Conjunctiva/sclera: Conjunctivae normal.     Pupils: Pupils are equal, round, and reactive to light.  Cardiovascular:     Rate and Rhythm: Normal rate and regular rhythm.     Pulses: Normal pulses.     Heart sounds: Normal heart sounds. No murmur heard.    No friction rub. No gallop.  Pulmonary:     Effort: Pulmonary effort is normal. No respiratory distress.  Breath sounds: Normal breath sounds. No stridor. No wheezing, rhonchi or rales.  Chest:     Chest wall: No tenderness.  Musculoskeletal:        General: Normal range of motion.     Cervical back: Normal range of motion and neck supple.  Skin:    General: Skin is warm and dry.     Capillary Refill: Capillary refill takes less than 2 seconds.     Coloration: Skin is not jaundiced or pale.     Findings: No bruising, erythema, lesion or rash.  Neurological:     General: No focal deficit present.     Mental Status: He is alert and oriented to person, place, and time. Mental status is at baseline.  Psychiatric:        Mood and Affect: Mood normal.         Behavior: Behavior normal.        Thought Content: Thought content normal.        Judgment: Judgment normal.     Results for orders placed or performed in visit on 07/03/23  Protime-INR  Result Value Ref Range   Prothrombin Time 30.5 (H) 11.4 - 15.2 seconds   INR 2.9 (H) 0.8 - 1.2      Assessment & Plan:   Problem List Items Addressed This Visit       Musculoskeletal and Integument   Muscular deconditioning - Primary    Will get him back in for PT. Call with any concerns. Referral to home health placed today.       Relevant Orders   Ambulatory referral to Home Health     Other   B12 deficiency    B12 shot given today.      Balance problem    Will get him back in for PT. Call with any concerns. Referral to home health placed today.       Relevant Orders   Ambulatory referral to Home Health   Dizziness    Doing significantly better. No more dizziness. Continue lower dose of gabapentin. Call with any concerns. Continue to monitor.       Relevant Orders   Ambulatory referral to Home Health     Follow up plan: Return in about 3 months (around 11/01/2023).

## 2023-08-07 ENCOUNTER — Inpatient Hospital Stay: Payer: Medicare Other

## 2023-08-08 ENCOUNTER — Ambulatory Visit: Payer: Self-pay

## 2023-08-08 ENCOUNTER — Other Ambulatory Visit: Payer: Self-pay

## 2023-08-08 ENCOUNTER — Inpatient Hospital Stay
Admission: EM | Admit: 2023-08-08 | Discharge: 2023-08-11 | DRG: 177 | Disposition: A | Discharge status: Home Health | Payer: Medicare Other | Attending: Internal Medicine | Admitting: Internal Medicine

## 2023-08-08 ENCOUNTER — Emergency Department: Payer: Medicare Other

## 2023-08-08 DIAGNOSIS — Z7901 Long term (current) use of anticoagulants: Secondary | ICD-10-CM

## 2023-08-08 DIAGNOSIS — Z85828 Personal history of other malignant neoplasm of skin: Secondary | ICD-10-CM | POA: Diagnosis not present

## 2023-08-08 DIAGNOSIS — H9190 Unspecified hearing loss, unspecified ear: Secondary | ICD-10-CM | POA: Diagnosis present

## 2023-08-08 DIAGNOSIS — E785 Hyperlipidemia, unspecified: Secondary | ICD-10-CM | POA: Diagnosis present

## 2023-08-08 DIAGNOSIS — Z66 Do not resuscitate: Secondary | ICD-10-CM | POA: Diagnosis present

## 2023-08-08 DIAGNOSIS — Z85820 Personal history of malignant melanoma of skin: Secondary | ICD-10-CM

## 2023-08-08 DIAGNOSIS — I82512 Chronic embolism and thrombosis of left femoral vein: Secondary | ICD-10-CM | POA: Diagnosis present

## 2023-08-08 DIAGNOSIS — J1282 Pneumonia due to coronavirus disease 2019: Secondary | ICD-10-CM | POA: Diagnosis present

## 2023-08-08 DIAGNOSIS — R413 Other amnesia: Secondary | ICD-10-CM | POA: Diagnosis present

## 2023-08-08 DIAGNOSIS — R509 Fever, unspecified: Secondary | ICD-10-CM | POA: Insufficient documentation

## 2023-08-08 DIAGNOSIS — Z79899 Other long term (current) drug therapy: Secondary | ICD-10-CM

## 2023-08-08 DIAGNOSIS — I129 Hypertensive chronic kidney disease with stage 1 through stage 4 chronic kidney disease, or unspecified chronic kidney disease: Secondary | ICD-10-CM | POA: Diagnosis present

## 2023-08-08 DIAGNOSIS — E538 Deficiency of other specified B group vitamins: Secondary | ICD-10-CM | POA: Diagnosis present

## 2023-08-08 DIAGNOSIS — R531 Weakness: Secondary | ICD-10-CM

## 2023-08-08 DIAGNOSIS — Z8616 Personal history of COVID-19: Secondary | ICD-10-CM | POA: Diagnosis not present

## 2023-08-08 DIAGNOSIS — U071 COVID-19: Secondary | ICD-10-CM | POA: Diagnosis not present

## 2023-08-08 DIAGNOSIS — C4491 Basal cell carcinoma of skin, unspecified: Secondary | ICD-10-CM | POA: Insufficient documentation

## 2023-08-08 DIAGNOSIS — N184 Chronic kidney disease, stage 4 (severe): Secondary | ICD-10-CM | POA: Diagnosis present

## 2023-08-08 DIAGNOSIS — R0600 Dyspnea, unspecified: Secondary | ICD-10-CM

## 2023-08-08 DIAGNOSIS — F03A0 Unspecified dementia, mild, without behavioral disturbance, psychotic disturbance, mood disturbance, and anxiety: Secondary | ICD-10-CM | POA: Diagnosis present

## 2023-08-08 DIAGNOSIS — N1832 Chronic kidney disease, stage 3b: Secondary | ICD-10-CM | POA: Insufficient documentation

## 2023-08-08 DIAGNOSIS — R2689 Other abnormalities of gait and mobility: Secondary | ICD-10-CM | POA: Diagnosis present

## 2023-08-08 DIAGNOSIS — R29898 Other symptoms and signs involving the musculoskeletal system: Secondary | ICD-10-CM | POA: Diagnosis present

## 2023-08-08 HISTORY — DX: Unspecified dementia, unspecified severity, without behavioral disturbance, psychotic disturbance, mood disturbance, and anxiety: F03.90

## 2023-08-08 LAB — RESP PANEL BY RT-PCR (RSV, FLU A&B, COVID)  RVPGX2
Influenza A by PCR: NEGATIVE
Influenza B by PCR: NEGATIVE
Resp Syncytial Virus by PCR: NEGATIVE
SARS Coronavirus 2 by RT PCR: POSITIVE — AB

## 2023-08-08 LAB — COMPREHENSIVE METABOLIC PANEL
ALT: 42 U/L (ref 0–44)
AST: 64 U/L — ABNORMAL HIGH (ref 15–41)
Albumin: 3.9 g/dL (ref 3.5–5.0)
Alkaline Phosphatase: 120 U/L (ref 38–126)
Anion gap: 9 (ref 5–15)
BUN: 18 mg/dL (ref 8–23)
CO2: 26 mmol/L (ref 22–32)
Calcium: 9.5 mg/dL (ref 8.9–10.3)
Chloride: 101 mmol/L (ref 98–111)
Creatinine, Ser: 1.76 mg/dL — ABNORMAL HIGH (ref 0.61–1.24)
GFR, Estimated: 37 mL/min — ABNORMAL LOW (ref >60)
Glucose, Bld: 112 mg/dL — ABNORMAL HIGH (ref 70–99)
Potassium: 4 mmol/L (ref 3.5–5.1)
Sodium: 136 mmol/L (ref 135–145)
Total Bilirubin: 1.3 mg/dL — ABNORMAL HIGH (ref 0.3–1.2)
Total Protein: 7.6 g/dL (ref 6.5–8.1)

## 2023-08-08 LAB — PROTIME-INR
INR: 2.5 — ABNORMAL HIGH (ref 0.8–1.2)
Prothrombin Time: 27.5 seconds — ABNORMAL HIGH (ref 11.4–15.2)

## 2023-08-08 LAB — CBC WITH DIFFERENTIAL/PLATELET
Abs Immature Granulocytes: 0 10*3/uL (ref 0.00–0.07)
Basophils Absolute: 0 10*3/uL (ref 0.0–0.1)
Basophils Relative: 0 %
Eosinophils Absolute: 0 10*3/uL (ref 0.0–0.5)
Eosinophils Relative: 0 %
HCT: 45.5 % (ref 39.0–52.0)
Hemoglobin: 15.6 g/dL (ref 13.0–17.0)
Lymphocytes Relative: 9 %
Lymphs Abs: 1.2 10*3/uL (ref 0.7–4.0)
MCH: 33.1 pg (ref 26.0–34.0)
MCHC: 34.3 g/dL (ref 30.0–36.0)
MCV: 96.6 fL (ref 80.0–100.0)
Monocytes Absolute: 1.7 10*3/uL — ABNORMAL HIGH (ref 0.1–1.0)
Monocytes Relative: 13 %
Neutro Abs: 10.3 10*3/uL — ABNORMAL HIGH (ref 1.7–7.7)
Neutrophils Relative %: 78 %
Platelets: 162 10*3/uL (ref 150–400)
RBC: 4.71 MIL/uL (ref 4.22–5.81)
RDW: 13.7 % (ref 11.5–15.5)
Smear Review: NORMAL
WBC: 13.2 10*3/uL — ABNORMAL HIGH (ref 4.0–10.5)
nRBC: 0 % (ref 0.0–0.2)

## 2023-08-08 LAB — URINALYSIS, COMPLETE (UACMP) WITH MICROSCOPIC
Bacteria, UA: NONE SEEN
Bilirubin Urine: NEGATIVE
Glucose, UA: 50 mg/dL — AB
Ketones, ur: NEGATIVE mg/dL
Leukocytes,Ua: NEGATIVE
Nitrite: NEGATIVE
Protein, ur: 30 mg/dL — AB
Specific Gravity, Urine: 1.003 — ABNORMAL LOW (ref 1.005–1.030)
pH: 6 (ref 5.0–8.0)

## 2023-08-08 LAB — PROCALCITONIN: Procalcitonin: 0.25 ng/mL

## 2023-08-08 LAB — LACTIC ACID, PLASMA: Lactic Acid, Venous: 1.3 mmol/L (ref 0.5–1.9)

## 2023-08-08 MED ORDER — ACETAMINOPHEN 650 MG RE SUPP: 650.0000 mg | Freq: Four times a day (QID) | RECTAL | Status: DC | PRN

## 2023-08-08 MED ORDER — HYDRALAZINE HCL 20 MG/ML IJ SOLN: 5.0000 mg | Freq: Four times a day (QID) | INTRAMUSCULAR | Status: DC | PRN

## 2023-08-08 MED ORDER — MELATONIN 5 MG PO TABS: 5.0000 mg | ORAL_TABLET | Freq: Every evening | ORAL | Status: DC | PRN

## 2023-08-08 MED ORDER — ONDANSETRON HCL 4 MG PO TABS: 4.0000 mg | ORAL_TABLET | Freq: Four times a day (QID) | ORAL | Status: DC | PRN

## 2023-08-08 MED ORDER — ONDANSETRON HCL 4 MG/2ML IJ SOLN: 4.0000 mg | Freq: Four times a day (QID) | INTRAMUSCULAR | Status: DC | PRN

## 2023-08-08 MED ORDER — ACETAMINOPHEN 325 MG PO TABS
650.0000 mg | ORAL_TABLET | Freq: Once | ORAL | Status: AC
  Administered 2023-08-08: 650 mg via ORAL
  Filled 2023-08-08: qty 2

## 2023-08-08 MED ORDER — SENNOSIDES-DOCUSATE SODIUM 8.6-50 MG PO TABS: 1.0000 | ORAL_TABLET | Freq: Every evening | ORAL | Status: DC | PRN

## 2023-08-08 MED ORDER — HYDROCOD POLI-CHLORPHE POLI ER 10-8 MG/5ML PO SUER: 5.0000 mL | Freq: Every evening | ORAL | Status: AC | PRN

## 2023-08-08 MED ORDER — ACETAMINOPHEN 325 MG PO TABS: 650.0000 mg | ORAL_TABLET | Freq: Four times a day (QID) | ORAL | Status: DC | PRN

## 2023-08-08 MED ORDER — GUAIFENESIN 100 MG/5ML PO LIQD: 5.0000 mL | Freq: Four times a day (QID) | ORAL | Status: DC | PRN

## 2023-08-08 MED ORDER — WARFARIN SODIUM 5 MG PO TABS
5.0000 mg | ORAL_TABLET | Freq: Once | ORAL | Status: AC
  Administered 2023-08-08: 5 mg via ORAL
  Filled 2023-08-08: qty 1

## 2023-08-08 MED ORDER — HEPARIN SODIUM (PORCINE) 5000 UNIT/ML IJ SOLN: 5000.0000 [IU] | Freq: Three times a day (TID) | INTRAMUSCULAR | Status: DC

## 2023-08-08 MED ORDER — WARFARIN - PHARMACIST DOSING INPATIENT: Freq: Every day | Status: DC

## 2023-08-08 NOTE — Assessment & Plan Note (Signed)
Per outpatient note on 08/01/2023: Patient received a B12 shot at this appointment

## 2023-08-08 NOTE — Assessment & Plan Note (Addendum)
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

## 2023-08-08 NOTE — Assessment & Plan Note (Signed)
-   Fall precaution 

## 2023-08-08 NOTE — Assessment & Plan Note (Addendum)
Symptomatic support Patient is not requiring oxygen supplementation at this time, no indication for IV steroids I will check a procalcitonin, if this is positive, we will initiate antibiotic for bacterial pneumonia Procalcitonin was borderline elevated, I did not initiate antibiotic at this time I am rechecking procalcitonin in the a.m., if continues to rise, would recommend a.m. team consider initiation of antibiotics to treat bacterial pneumonia. Spouse reports that patient had bacterial pneumonia the last time he had covid+ infection Admit to telemetry medical, inpatient

## 2023-08-08 NOTE — H&P (Addendum)
History and Physical   Gregg Thomas EXB:[id number] DOB: 24-Feb-1931 DOA: 08/08/2023  PCP: Dorcas Carrow, DO  Patient coming from: Home via EMS  I have personally briefly reviewed patient's old medical records in [facility] EMR.  Chief Concern: Weakness, fall, fever  HPI: Mr. Gregg Thomas is a 89 year old male with medical history of mild dementia, muscular deconditioning, generalized weakness, B12 deficiency, hypertension, hyperlipidemia, who presents to the emergency department for chief concerns of shortness of breath and difficulty walking.  Vitals in the ED showed temperature of 101.4, respiration rate of 15, heart rate of 75, blood pressure 155/98, SpO2 of 98% on room air.  Serum sodium was 136, potassium 4.0, chloride 101, bicarb 26, BUN of 18, serum creatinine 1.76, EGFR 37, nonfasting blood glucose 112, WBC 13.2, hemoglobin 15.6, platelets of 162.  COVID PCR was positive.  ED treatment: Acetaminophen 650 mg p.o. one-time dose. ----------------------------- At bedside, he is able to tell me his name, age, current year is 2023.   Spouse at bedside, states she felt unwell, weak, congested on Saturday. He started not feeling well on Tuesday.  Over the last 2 days, spouse reports that patient was so weak that he was not able to walk around the house.  Spouse reports that she is too frail to be able to help get him up around the house and so EMS was called.  At bedside, patient denies chest pain, shortness of breath, dysuria, hematuria, diarrhea.  Spouse denies vomiting, fever, diarrhea at home.  Social history: Patient lives at home with his wife who is also in her 29s of over 67 years.  His wife is elderly and also has COVID and is having difficulty helping care for patient who has dementia.  Patient denies tobacco, EtOH, recreational drug use. He is retired and formerly worked at [employer]   ROS: Constitutional: no weight change, no fever ENT/Mouth: no  sore throat, no rhinorrhea Eyes: no eye pain, no vision changes Cardiovascular: no chest pain, no dyspnea,  no edema, no palpitations Respiratory: no cough, no sputum, no wheezing Gastrointestinal: no nausea, no vomiting, no diarrhea, no constipation Genitourinary: no urinary incontinence, no dysuria, no hematuria Musculoskeletal: no arthralgias, no myalgias Skin: no skin lesions, no pruritus, Neuro: + weakness, no loss of consciousness, no syncope Psych: no anxiety, no depression, + decrease appetite Heme/Lymph: no bruising, no bleeding  ED Course: Discussed with emergency medicine provider, patient requiring hospitalization for chief concerns of difficulty walking, high risk of falling, COVID-19 infection and fever.  Assessment/Plan  Principal Problem:   Dyspnea due to COVID-19 Active Problems:   Chronic anticoagulation   Benign hypertensive renal disease   Hearing loss   Chronic deep vein thrombosis (DVT) of femoral vein of left lower extremity (HCC)   Memory loss   B12 deficiency   Muscular deconditioning   Balance problem   History of melanoma   Fever   CKD stage 3b, GFR 30-44 ml/min (HCC)   Basal cell carcinoma   Assessment and Plan:  * Dyspnea due to COVID-19 Symptomatic support Patient is not requiring oxygen supplementation at this time, no indication for IV steroids I will check a procalcitonin, if this is positive, we will initiate antibiotic for bacterial pneumonia Procalcitonin was borderline elevated, I did not initiate antibiotic at this time I am rechecking procalcitonin in the a.m., if continues to rise, would recommend a.m. team consider initiation of antibiotics to treat bacterial pneumonia. Spouse reports that patient had bacterial pneumonia the  last time he had covid+ infection Admit to telemetry medical, inpatient  Basal cell carcinoma Currently on hedgehog inhibitor outpatient, continue outpatient follow-up  Fever Check lactic acid and a UA, we  will treat as appropriate  Balance problem Fall precautions  Muscular deconditioning Fall precaution  B12 deficiency Per outpatient note on 08/01/2023: Patient received a B12 shot at this appointment  Chronic deep vein thrombosis (DVT) of femoral vein of left lower extremity (HCC) Home warfarin per pharmacy resumed  Benign hypertensive renal disease Hydralazine 5 mg IV every 6 hours as needed for SBP greater 175, 4 days ordered  Chart reviewed.   DVT prophylaxis: Heparin 5000 units subcutaneous every 8 hours Code Status: full code Diet: Heart healthy Family Communication: updated spouse at bedside  Disposition Plan: Pending clinical course Consults called: PT, OT Admission status: Telemetry medical, inpatient  Past Medical History:  Diagnosis Date   Chronic kidney disease    Clotting disorder (HCC)    Dementia (HCC)    Heart murmur    Skin cancer    Past Surgical History:  Procedure Laterality Date   biopsy Right 08/26/2019   temple biopsy - dr.dasher    EYE SURGERY Right    benign growth on right eye    KNEE SURGERY Right    MELANOMA EXCISION Bilateral    lower back    Social History:  reports that he has never smoked. He has never used smokeless tobacco. He reports that he does not drink alcohol and does not use drugs.  No Known Allergies Family History  Problem Relation Age of Onset   Stroke Mother    Cerebral aneurysm Father    Family history: Family history reviewed and not pertinent.  Prior to Admission medications   Medication Sig Start Date End Date Taking? Authorizing Provider  amLODipine (NORVASC) 2.5 MG tablet Take 1 tablet (2.5 mg total) by mouth daily. 04/29/23   Johnson, Megan P, DO  ascorbic acid (VITAMIN C) 1000 MG tablet Take by mouth.    [provider]  cetirizine (ZYRTEC) 5 MG tablet Take 1 tablet (5 mg total) by mouth daily. 04/29/23   Johnson, Megan P, DO  Cholecalciferol (VITAMIN D3 PO) Take 1 tablet by mouth daily.    [provider]  Cyanocobalamin (VITAMIN B-12 ER PO) Take 1,000 mcg by mouth daily at 6 (six) AM.    [provider]  donepezil (ARICEPT) 10 MG tablet Take 1 tablet (10 mg total) by mouth at bedtime. 04/29/23   Johnson, Megan P, DO  ELDERBERRY PO Take 1 tablet by mouth daily.    [provider]  fluticasone (FLONASE) 50 MCG/ACT nasal spray Place 2 sprays into both nostrils daily. 04/29/23   Johnson, Megan P, DO  gabapentin (NEURONTIN) 100 MG capsule Take 1 capsule (100 mg total) by mouth at bedtime. 08/01/23   Johnson, Megan P, DO  Grape Seed Extract 50 MG CAPS Take 1,300 mg by mouth daily.     [provider]  Ipratropium-Albuterol (COMBIVENT) 20-100 MCG/ACT AERS respimat Inhale 1 puff into the lungs every 6 (six) hours.    [provider]  montelukast (SINGULAIR) 10 MG tablet Take 1 tablet (10 mg total) by mouth at bedtime. 04/29/23   Olevia Perches P, DO  warfarin (COUMADIN) 5 MG tablet Take 1 tablet (5 mg total) by mouth one time only at 6 PM. 07/15/23   Earna Coder, MD  zinc gluconate 50 MG tablet Take by mouth.    [provider]   Physical Exam: Vitals:   08/08/23 1235 08/08/23 1243 08/08/23 1518 08/08/23 1604  BP: (!) 155/98  (!) 109/52 (!) 122/57  Pulse: 75  85 66  Resp:   20 17  Temp:  (!) 101.4 F (38.6 C)  97.9 F (36.6 C)  TempSrc:  Rectal    SpO2: 98%  100% 94%  Weight: 72.9 kg     Height: 5' 7.01" (1.702 m)      Constitutional: appears age-appropriate, frail, NAD, calm Eyes: PERRL, lids and conjunctivae normal ENMT: Mucous membranes are moist. Posterior pharynx clear of any exudate or lesions. Age-appropriate dentition. Hearing appropriate Neck: normal, supple, no masses, no thyromegaly Respiratory: clear to auscultation bilaterally, no wheezing, no crackles. Normal respiratory effort. No accessory muscle use.  Cardiovascular: Regular rate and rhythm, no murmurs / rubs / gallops. No extremity edema. 2+ pedal pulses. No  carotid bruits.  Abdomen: no tenderness, no masses palpated, no hepatosplenomegaly. Bowel sounds positive.  Musculoskeletal: no clubbing / cyanosis. No joint deformity upper and lower extremities. Good ROM, no contractures, no atrophy. Normal muscle tone.  Skin: no rashes, lesions, ulcers. No induration.  Geriatric skin Neurologic: Sensation intact.  Generalized weakness Psychiatric: Normal judgment and insight. Alert and oriented x 3. Normal mood.   EKG: independently reviewed, showing sinus rhythm with rate of 79, qtc 431  Chest x-ray on Admission: I personally reviewed and I agree with radiologist reading as below.  DG Chest Portable 1 View  Result Date: 08/08/2023 CLINICAL DATA:  Shortness of breath. EXAM: PORTABLE CHEST 1 VIEW COMPARISON:  October 30, 2021. FINDINGS: The heart size and mediastinal contours are within normal limits. Both lungs are clear. The visualized skeletal structures are unremarkable. IMPRESSION: No active disease. Electronically Signed   By: Lupita Raider M.D.   On: 08/08/2023 13:06    Labs on Admission: I have personally reviewed following labs  CBC: Recent Labs  Lab 08/08/23 1245  WBC 13.2*  NEUTROABS 10.3*  HGB 15.6  HCT 45.5  MCV 96.6  PLT 162   Basic Metabolic Panel: Recent Labs  Lab 08/08/23 1245  NA 136  K 4.0  CL 101  CO2 26  GLUCOSE 112*  BUN 18  CREATININE 1.76*  CALCIUM 9.5   GFR: Estimated Creatinine Clearance: 26.6 mL/min (A) (by C-G formula based on SCr of 1.76 mg/dL (H)).  Liver Function Tests: Recent Labs  Lab 08/08/23 1245  AST 64*  ALT 42  ALKPHOS 120  BILITOT 1.3*  PROT 7.6  ALBUMIN 3.9   Urine analysis:    Component Value Date/Time   COLORURINE YELLOW (A) 10/30/2021 2128   APPEARANCEUR Clear 07/02/2023 1052   LABSPEC 1.019 10/30/2021 2128   PHURINE 5.0 10/30/2021 2128   GLUCOSEU Negative 07/02/2023 1052   HGBUR MODERATE (A) 10/30/2021 2128   BILIRUBINUR Negative 07/02/2023 1052   KETONESUR NEGATIVE  10/30/2021 2128   PROTEINUR 3+ (A) 07/02/2023 1052   PROTEINUR 100 (A) 10/30/2021 2128   NITRITE Negative 07/02/2023 1052   NITRITE NEGATIVE 10/30/2021 2128   LEUKOCYTESUR Negative 07/02/2023 1052   LEUKOCYTESUR NEGATIVE 10/30/2021 2128   This document was prepared using Dragon Voice Recognition software and may include unintentional dictation errors.  Dr. Sedalia Muta [facility]  If 7PM-7AM, please contact overnight-coverage provider If 7AM-7PM, please contact day attending provider [email or web address]  08/08/2023, 5:32 PM

## 2023-08-08 NOTE — Assessment & Plan Note (Signed)
Fall precautions.  

## 2023-08-08 NOTE — Telephone Encounter (Signed)
    Chief Complaint: High BP 187/92, weakness, "He's having a hard time walking around the house, very weak." Concerned he is dehydrated. Has congestion. Symptoms: Above Frequency: This week Pertinent Negatives: Patient denies  Disposition: [x] ED /[] Urgent Care (no appt availability in office) / [] Appointment(In office/virtual)/ []  [place] [facility]/ [] Home Care/ [] Refused Recommended Disposition /[] [place] [facility]/ []  Follow-up with PCP Additional Notes: Wife will call 911 for transportation to ED.  Reason for Disposition  Sounds like a life-threatening emergency to the triager  Answer Assessment - Initial Assessment Questions 1. BLOOD PRESSURE: "What is the blood pressure?" "Did you take at least two measurements 5 minutes apart?"     187/92 2. ONSET: "When did you take your blood pressure?"     Today 3. HOW: "How did you take your blood pressure?" (e.g., automatic home BP monitor, visiting nurse)     Home cuff 4. HISTORY: "Do you have a history of high blood pressure?"     Yes 5. MEDICINES: "Are you taking any medicines for blood pressure?" "Have you missed any doses recently?"     None today 6. OTHER SYMPTOMS: "Do you have any symptoms?" (e.g., blurred vision, chest pain, difficulty breathing, headache, weakness)     Weakness, congestion 7. PREGNANCY: "Is there any chance you are pregnant?" "When was your last menstrual period?"     N/a  Protocols used: Blood Pressure - High-A-AH

## 2023-08-08 NOTE — Assessment & Plan Note (Addendum)
Check lactic acid and a UA, we will treat as appropriate

## 2023-08-08 NOTE — Hospital Course (Addendum)
Mr. Koichi Crocitto is a 89 year old male with medical history of mild dementia, muscular deconditioning, generalized weakness, B12 deficiency, hypertension, hyperlipidemia, who presents to the emergency department for chief concerns of shortness of breath and difficulty walking.  Vitals in the ED showed temperature of 101.4, respiration rate of 15, heart rate of 75, blood pressure 155/98, SpO2 of 98% on room air.  Serum sodium was 136, potassium 4.0, chloride 101, bicarb 26, BUN of 18, serum creatinine 1.76, EGFR 37, nonfasting blood glucose 112, WBC 13.2, hemoglobin 15.6, platelets of 162.  COVID PCR was positive.  ED treatment: Acetaminophen 650 mg p.o. one-time dose.

## 2023-08-08 NOTE — Assessment & Plan Note (Signed)
-   Hydralazine 5 mg IV every 6 hours as needed for SBP greater 175, 4 days ordered 

## 2023-08-08 NOTE — Assessment & Plan Note (Signed)
Home warfarin per pharmacy resumed

## 2023-08-08 NOTE — ED Provider Notes (Signed)
[facility] Provider Note    Event Date/Time   First MD Initiated Contact with Patient 08/08/23 1228     (approximate)   History   Nasal Congestion (89 yo c/o congestion and weakness 8/3/Arrived via [facility]/Taking mucinex, denies relief, lower airway congest /Denies drug allergies/Dementia at baseline upon arrival GCS 14, Kidney dz, Htn/Blood thinners, unk what name is/99.5 oral, glucose 141, HR 79, SPo2 RA 94%, BP 136/66 per EMS)   HPI Gregg Thomas is a 89 y.o. male with CKD stage IV, HTN, dementia who presents today for weakness and congestion.  Wife called EMS after noticing for the past couple days he has sounded more congested and thought he was more short of breath.  She also felt like he was weaker and that this was similar to when he had COVID.  She had COVID in the past couple weeks and thinks he may have gotten that.  Patient is oriented to name and place but not year.  He mostly notes the nasal congestion but otherwise denies shortness of breath, chest pain, abdominal pain, nausea, vomiting, weakness.     Physical Exam   Triage Vital Signs: ED Triage Vitals  Encounter Vitals Group     BP --      Systolic BP Percentile --      Diastolic BP Percentile --      Pulse --      Resp --      Temp --      Temp src --      SpO2 08/08/23 1232 94 %     Weight 08/08/23 1235 160 lb 11.5 oz (72.9 kg)     Height 08/08/23 1235 5' 7.01" (1.702 m)     Head Circumference --      Peak Flow --      Pain Score 08/08/23 1234 0     Pain Loc --      Pain Education --      Exclude from Growth Chart --     Most recent vital signs: Vitals:   08/08/23 1518 08/08/23 1604  BP: (!) 109/52 (!) 122/57  Pulse: 85 66  Resp: 20 17  Temp:  97.9 F (36.6 C)  SpO2: 100% 94%   Physical Exam: I have reviewed the vital signs and nursing notes. General: Awake, alert, no acute distress.  Nontoxic appearing. Head:  Atraumatic, normocephalic.   ENT:  EOM intact,  PERRL. Oral mucosa is pink and moist with no lesions.  Nasal congestion present. Neck: Neck is supple with full range of motion, No meningeal signs. Cardiovascular:  RRR, No murmurs. Peripheral pulses palpable and equal bilaterally. Respiratory:  Symmetrical chest wall expansion.  No rhonchi, rales, or wheezes.  Good air movement throughout.  No use of accessory muscles.   Musculoskeletal:  No cyanosis or edema. Moving extremities with full ROM Abdomen:  Soft, nontender, nondistended. Neuro:  GCS 14 (baseline, confusion), moving all four extremities, interacting appropriately. Speech clear. Psych:  Calm, appropriate.  Skin:  Warm, dry, no rash.    ED Results / Procedures / Treatments   Labs (all labs ordered are listed, but only abnormal results are displayed) Labs Reviewed  RESP PANEL BY RT-PCR (RSV, FLU A&B, COVID)  [id number] - Abnormal; Notable for the following components:      Result Value   SARS Coronavirus 2 by RT PCR POSITIVE (*)    All other components within normal limits  COMPREHENSIVE METABOLIC PANEL - Abnormal; Notable for the  following components:   Glucose, Bld 112 (*)    Creatinine, Ser 1.76 (*)    AST 64 (*)    Total Bilirubin 1.3 (*)    GFR, Estimated 37 (*)    All other components within normal limits  CBC WITH DIFFERENTIAL/PLATELET - Abnormal; Notable for the following components:   WBC 13.2 (*)    Neutro Abs 10.3 (*)    Monocytes Absolute 1.7 (*)    All other components within normal limits  PROCALCITONIN  URINALYSIS, COMPLETE (UACMP) WITH MICROSCOPIC  LACTIC ACID, PLASMA  BASIC METABOLIC PANEL  CBC  PROCALCITONIN     EKG My EKG interpretation: Rate of 79, NSR. Left anterior fascicular block. No acute ST elevations or depressions. Prolonged PR interval.   RADIOLOGY See ED course   PROCEDURES:  Critical Care performed: No  Procedures   MEDICATIONS ORDERED IN ED: Medications  acetaminophen (TYLENOL) tablet 650 mg (has no administration in time  range)    Or  acetaminophen (TYLENOL) suppository 650 mg (has no administration in time range)  ondansetron (ZOFRAN) tablet 4 mg (has no administration in time range)    Or  ondansetron (ZOFRAN) injection 4 mg (has no administration in time range)  senna-docusate (Senokot-S) tablet 1 tablet (has no administration in time range)  hydrALAZINE (APRESOLINE) injection 5 mg (has no administration in time range)  melatonin tablet 5 mg (has no administration in time range)  guaiFENesin (ROBITUSSIN) 100 MG/5ML liquid 5 mL (has no administration in time range)  chlorpheniramine-HYDROcodone (TUSSIONEX) 10-8 MG/5ML suspension 5 mL (has no administration in time range)  acetaminophen (TYLENOL) tablet 650 mg (650 mg Oral Given 08/08/23 1308)     IMPRESSION / MDM / ASSESSMENT AND PLAN / ED COURSE  I reviewed the triage vital signs and the nursing notes.                              Differential diagnosis includes, but is not limited to, covid, flu, pneumonia, lower suspicion of ACS or pneumothorax.  Patient's presentation is most consistent with acute illness / injury with system symptoms.  89 year old male presenting with viral URI symptoms and profound weakness. Febrile and given tylenol. Blood work up largely reassuring. Was found to be positive for COVID 19, explaining current symptoms. Currently lives at home with his elderly wife and unable to ambulate. Patient admitted to hospitalist for management of his acute weakness 2/2 Covid for PT/OT.    Clinical Course as of 08/08/23 1731  Thu Aug 08, 2023  1246 Temp(!): 101.4 F (38.6 C) [DW]  1309 DG Chest Portable 1 View I viewed and interpreted chest x-ray with no acute cardiopulmonary pathology [DW]  1329 CBC with Differential(!) Mild leukocytosis present in the setting of fever. Suspect URI [DW]  1344 Comprehensive metabolic panel(!) Creatinine similar to prior levels. Otherwise reassuring [DW]  1409 SARS Coronavirus 2 by RT PCR(!): POSITIVE  [DW]  1436 Reassessed patient with wife in the room.  Wife states that patient has hardly been able to ambulate for the past 24 hours.  He is too weak to get up on his own.  She is also elderly and unable to take care of him.  Will discuss the case with hospitalist for possible admission for PT OT and hydration while overcoming initial COVID infection. [DW]  1446 Hospitalist agreed to admit patient for further care [DW]    Clinical Course User Index [DW] Janith Lima, MD  FINAL CLINICAL IMPRESSION(S) / ED DIAGNOSES   Final diagnoses:  COVID-19  Weakness     Rx / DC Orders   ED Discharge Orders     None        Note:  This document was prepared using Dragon voice recognition software and may include unintentional dictation errors.   Janith Lima, MD 08/08/23 [phone]

## 2023-08-08 NOTE — Consult Note (Signed)
ANTICOAGULATION CONSULT NOTE  Pharmacy Consult for Warfarin Indication: h/o of DVT  No Known Allergies  Patient Measurements: Height: 5' 7.01" (170.2 cm) Weight: 72.9 kg (160 lb 11.5 oz) IBW/kg (Calculated) : 66.12 Heparin Dosing Weight: 72.9 kg  Vital Signs: Temp: 97.9 F (36.6 C) (08/08 1604) Temp Source: Rectal (08/08 1243) BP: 122/57 (08/08 1604) Pulse Rate: 66 (08/08 1604)  Labs: Recent Labs    08/08/23 1245  HGB 15.6  HCT 45.5  PLT 162  CREATININE 1.76*    Estimated Creatinine Clearance: 26.6 mL/min (A) (by C-G formula based on SCr of 1.76 mg/dL (H)).   Medical History: Past Medical History:  Diagnosis Date   Chronic kidney disease    Clotting disorder (HCC)    Dementia (HCC)    Heart murmur    Skin cancer     Pertinent Medications:  PTA dose: Warfarin 5mg  daily, last dose: 8/7@1800   Assessment: 89 year old male with medical history of mild dementia, muscular deconditioning, generalized weakness, B12 deficiency, hypertension, hyperlipidemia, who presents to the emergency department for chief concerns of shortness of breath and difficulty walking. Patient also with history of left lower extremity chronic DVT/PE, on warfarin. Pharmacy has been consulted to continue medication while inpatient.    Goal of Therapy:  INR 2-3 Monitor platelets by anticoagulation protocol: Yes  Date: INR: Dose: 8/8 2.5    Plan:  - INR stable - Will order home dose of Warfarin 5mg  x 1. - Per request of the patient and wife, would like to have dose scheduled@1800  each evening, as that is when he takes it at home - CBC/INR daily  Bettey Costa 08/08/2023,6:37 PM

## 2023-08-08 NOTE — ED Triage Notes (Signed)
89 yo c/o congestion and weakness 8/3 Arrived via [facility] Taking mucinex, denies relief, lower airway congest  Denies drug allergies Dementia at baseline upon arrival GCS 14, Kidney dz, Htn Blood thinners, unk what name is 99.5 oral, glucose 141, HR 79, SPo2 RA 94%, BP 136/66 per EMS Wife concerned, Pt has PNA/COVID

## 2023-08-09 DIAGNOSIS — R0600 Dyspnea, unspecified: Secondary | ICD-10-CM | POA: Diagnosis not present

## 2023-08-09 DIAGNOSIS — U071 COVID-19: Secondary | ICD-10-CM | POA: Diagnosis not present

## 2023-08-09 LAB — GLUCOSE, CAPILLARY: Glucose-Capillary: 94 mg/dL (ref 70–99)

## 2023-08-09 MED ORDER — WARFARIN SODIUM 5 MG PO TABS
5.0000 mg | ORAL_TABLET | Freq: Once | ORAL | Status: AC
  Administered 2023-08-09: 5 mg via ORAL
  Filled 2023-08-09: qty 1

## 2023-08-09 MED ORDER — AMLODIPINE BESYLATE 5 MG PO TABS
2.5000 mg | ORAL_TABLET | Freq: Every day | ORAL | Status: DC
  Administered 2023-08-09: 2.5 mg via ORAL
  Filled 2023-08-09: qty 1

## 2023-08-09 MED ORDER — MONTELUKAST SODIUM 10 MG PO TABS
10.0000 mg | ORAL_TABLET | Freq: Every day | ORAL | Status: DC
  Administered 2023-08-09: 10 mg via ORAL
  Filled 2023-08-09: qty 1

## 2023-08-09 MED ORDER — GABAPENTIN 100 MG PO CAPS
100.0000 mg | ORAL_CAPSULE | Freq: Every day | ORAL | Status: DC
  Administered 2023-08-09: 100 mg via ORAL
  Filled 2023-08-09: qty 1

## 2023-08-09 MED ORDER — GUAIFENESIN ER 600 MG PO TB12
600.0000 mg | ORAL_TABLET | Freq: Two times a day (BID) | ORAL | Status: DC
  Administered 2023-08-09 – 2023-08-11 (×5): 600 mg via ORAL
  Filled 2023-08-09 (×5): qty 1

## 2023-08-09 MED ORDER — SODIUM CHLORIDE 0.9 % IV SOLN
500.0000 mg | INTRAVENOUS | Status: AC
  Administered 2023-08-09 – 2023-08-11 (×3): 500 mg via INTRAVENOUS
  Filled 2023-08-09 (×3): qty 5

## 2023-08-09 MED ORDER — SODIUM CHLORIDE 0.9 % IV SOLN
1.0000 g | INTRAVENOUS | Status: DC
  Administered 2023-08-09 – 2023-08-11 (×3): 1 g via INTRAVENOUS
  Filled 2023-08-09 (×3): qty 10

## 2023-08-09 NOTE — Plan of Care (Signed)

## 2023-08-09 NOTE — Evaluation (Signed)
Occupational Therapy Evaluation Patient Details Name: Gregg Thomas MRN: [id number] DOB: 1931-01-31 Today's Date: 08/09/2023   History of Present Illness Gregg Thomas is a 89 year old male with medical history of mild dementia, muscular deconditioning, generalized weakness, B12 deficiency, hypertension, hyperlipidemia, who presents to the emergency department for chief concerns of shortness of breath and difficulty walking.   Clinical Impression   Pt was seen for OT evaluation this date. Prior to hospital admission, pt was generally independent with mobility and ADL tasks. Pt lives with his spouse of over 70 years who assists with IADL. Pt presents to acute OT demonstrating near baseline ADL performance and functional mobility (See OT problem list for additional functional deficits). Pt/spouse educated in using shower bench for seated showers for energy conservation and falls prevention. Pt/spouse verbalized understanding. VSS. Do not anticipate the need for follow up OT services upon acute hospital DC. Will sign off at this time. Please re-consult if acute OT needs arise.     If plan is discharge home, recommend the following: A little help with walking and/or transfers;A little help with bathing/dressing/bathroom;Assistance with cooking/housework;Help with stairs or ramp for entrance;Assist for transportation;Direct supervision/assist for financial management;Direct supervision/assist for medications management;Supervision due to cognitive status    Functional Status Assessment  Patient has not had a recent decline in their functional status  Equipment Recommendations  None recommended by OT    Recommendations for Other Services       Precautions / Restrictions Precautions Precautions: Fall Precaution Comments: COVID precautions Restrictions Weight Bearing Restrictions: No      Mobility Bed Mobility Overal bed mobility: Modified Independent                   Transfers Overall transfer level: Modified independent Equipment used: None                      Balance Overall balance assessment: Modified Independent                                         ADL either performed or assessed with clinical judgement   ADL Overall ADL's : At baseline;Independent                                             Vision         Perception         Praxis         Pertinent Vitals/Pain Pain Assessment Pain Assessment: No/denies pain     Extremity/Trunk Assessment Upper Extremity Assessment Upper Extremity Assessment: Overall WFL for tasks assessed   Lower Extremity Assessment Lower Extremity Assessment: Overall WFL for tasks assessed   Cervical / Trunk Assessment Cervical / Trunk Assessment: Normal   Communication Communication Communication: No apparent difficulties Cueing Techniques: Verbal cues   Cognition Arousal: Alert Behavior During Therapy: WFL for tasks assessed/performed Overall Cognitive Status: History of cognitive impairments - at baseline                                 General Comments: mild dementia- likely at baseline     General Comments       Exercises Other Exercises Other Exercises:  Pt/spouse educated in using shower bench for seated showers for energy conservation and falls prevention.   Shoulder Instructions      Home Living Family/patient expects to be discharged to:: Private residence Living Arrangements: Spouse/significant other Available Help at Discharge: Family;Available 24 hours/day Type of Home: House Home Access: Stairs to enter [employer] of Steps: 5 front with rails, 6 through garage into kitchen   Home Layout: Two level;Able to live on main level with bedroom/bathroom Alternate Level Stairs-Number of Steps: 11   Bathroom Shower/Tub: Producer, television/film/video: Standard     Home Equipment: Agricultural consultant  (2 wheels);Cane - single point;Shower seat - built in   Additional Comments: was not using AD prior to admission      Prior Functioning/Environment Prior Level of Function : Driving;Independent/Modified Independent             Mobility Comments: hasn't used AD in past 2 yrs ADLs Comments: indep with ADL, doesn't recall any falls (but patient admitted with fall) mod indep wtih med mgt using pill organizer        OT Problem List: Decreased activity tolerance      OT Treatment/Interventions:      OT Goals(Current goals can be found in the care plan section) Acute Rehab OT Goals Patient Stated Goal: go home OT Goal Formulation: All assessment and education complete, DC therapy  OT Frequency:      Co-evaluation PT/OT/SLP Co-Evaluation/Treatment: Yes Reason for Co-Treatment: For patient/therapist safety;To address functional/ADL transfers PT goals addressed during session: Mobility/safety with mobility;Balance OT goals addressed during session: ADL's and self-care      AM-PAC OT "6 Clicks" Daily Activity     Outcome Measure Help from another person eating meals?: None Help from another person taking care of personal grooming?: None Help from another person toileting, which includes using toliet, bedpan, or urinal?: None Help from another person bathing (including washing, rinsing, drying)?: A Little Help from another person to put on and taking off regular upper body clothing?: None Help from another person to put on and taking off regular lower body clothing?: None 6 Click Score: 23   End of Session    Activity Tolerance: Patient tolerated treatment well Patient left: in chair;with call bell/phone within reach;with chair alarm set;with family/visitor present  OT Visit Diagnosis: Other abnormalities of gait and mobility (R26.89)                Time: 6606-3016 OT Time Calculation (min): 16 min Charges:  OT General Charges $OT Visit: 1 Visit OT Evaluation $OT Eval  Low Complexity: 1 Low  Arman Filter., MPH, MS, OTR/L ascom [phone] 08/09/23, 3:31 PM

## 2023-08-09 NOTE — TOC Initial Note (Signed)
Transition of Care [facility]) - Initial/Assessment Note    Patient Details  Name: Gregg Thomas MRN: [id number] Date of Birth: 03/06/1931  Transition [facility]) CM/SW Contact:    Allena Katz, LCSW Phone Number: 08/09/2023, 3:50 PM  Clinical Narrative:  Pt admitted from home with wife with covid. Pt has dementia at baseline, wife contacted to discuss home health services for patient. Per pt ping, pt is not active with any home health at this time. CSW will follow back up with wife for home care needs.                        Patient Goals and CMS Choice            Expected Discharge Plan and Services                                              Prior Living Arrangements/Services                       Activities of Daily Living Home Assistive Devices/Equipment: None ADL Screening (condition at time of admission) Patient's cognitive ability adequate to safely complete daily activities?: No Is the patient deaf or have difficulty hearing?: No Does the patient have difficulty seeing, even when wearing glasses/contacts?: No Does the patient have difficulty concentrating, remembering, or making decisions?: Yes Patient able to express need for assistance with ADLs?: Yes Does the patient have difficulty dressing or bathing?: Yes Independently performs ADLs?: No Communication: Independent Dressing (OT): Needs assistance Is this a change from baseline?: Pre-admission baseline Grooming: Needs assistance Is this a change from baseline?: Pre-admission baseline Feeding: Independent Bathing: Needs assistance Is this a change from baseline?: Pre-admission baseline Toileting: Needs assistance Is this a change from baseline?: Pre-admission baseline In/Out Bed: Needs assistance Is this a change from baseline?: Change from baseline, expected to last <3 days Walks in Home: Independent with device (comment) Does the patient have difficulty walking or  climbing stairs?: Yes Weakness of Legs: Both Weakness of Arms/Hands: None  Permission Sought/Granted                  Emotional Assessment              Admission diagnosis:  Dyspnea due to COVID-19 [U07.1, R06.00] Patient Active Problem List   Diagnosis Date Noted   Dyspnea due to COVID-19 08/08/2023   Fever 08/08/2023   CKD stage 3b, GFR 30-44 ml/min (HCC) 08/08/2023   Basal cell carcinoma 08/08/2023   History of melanoma 07/01/2023   Dizziness 07/01/2023   Muscular deconditioning 02/25/2023   Balance problem 02/25/2023   Secondary hypoparathyroidism 01/01/2023   B12 deficiency 05/15/2022   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   Senile purpura (HCC) 10/28/2021   Acquired thrombophilia (HCC) 06/20/2020   Memory loss 06/20/2020   Chronic deep vein thrombosis (DVT) of femoral vein of left lower extremity (HCC) 11/23/2019   Benign hypertensive renal disease 02/03/2019   Advanced care planning/counseling discussion 02/03/2019   Hearing loss 02/03/2019   Squamous cell carcinoma, face 10/22/2018   CKD (chronic kidney disease) stage 4, GFR 15-29 ml/min (HCC) 10/22/2018   Chronic anticoagulation 08/22/2017   PCP:  Dorcas Carrow, DO Pharmacy:   [facility] [place], [place] - [address]  [place] [address] Phone: [phone] Fax: [phone]     Social Determinants of Health (SDOH) Social History: SDOH Screenings   Food Insecurity: No Food Insecurity (08/08/2023)  Housing: Patient Declined (08/08/2023)  Transportation Needs: No Transportation Needs (08/08/2023)  Utilities: Not At Risk (08/08/2023)  Alcohol Screen: Low Risk  (09/24/2022)  Depression (PHQ2-9): Medium Risk (08/01/2023)  Financial Resource Strain: Low Risk  (09/24/2022)  Physical Activity: Insufficiently Active (09/24/2022)  Social Connections: Moderately Integrated (09/24/2022)  Stress: No Stress Concern Present (09/24/2022)  Tobacco Use: Low Risk  (08/08/2023)   SDOH  Interventions:     Readmission Risk Interventions     No data to display

## 2023-08-09 NOTE — Evaluation (Signed)
Physical Therapy Evaluation Patient Details Name: Gregg Thomas MRN: [id number] DOB: Oct 21, 1931 Today's Date: 08/09/2023  History of Present Illness  Gregg Thomas is a 89 year old male with medical history of mild dementia, muscular deconditioning, generalized weakness, B12 deficiency, hypertension, hyperlipidemia, who presents to the emergency department for chief concerns of shortness of breath and difficulty walking.   Clinical Impression  Patient received in bed, he is pleasant and agrees to therapy. Patient is independent with bed mobility and transfers with supervision. He ambulated 150 feet without AD, mild hand held A. No lob. Appears to be at baseline level of function and does not require continued skilled PT while here. Patient is encouraged to continue ambulating with staff or mobility specialist while here.           If plan is discharge home, recommend the following: Help with stairs or ramp for entrance   Can travel by private vehicle        Equipment Recommendations None recommended by PT  Recommendations for Other Services       Functional Status Assessment Patient has not had a recent decline in their functional status     Precautions / Restrictions Precautions Precautions: Fall Precaution Comments: COVID precautions Restrictions Weight Bearing Restrictions: No      Mobility  Bed Mobility Overal bed mobility: Modified Independent                  Transfers Overall transfer level: Modified independent Equipment used: None                    Ambulation/Gait Ambulation/Gait assistance: Min assist Gait Distance (Feet): 150 Feet Assistive device: None, 1 person hand held assist Gait Pattern/deviations: Step-through pattern, Decreased step length - right, Decreased step length - left, Shuffle Gait velocity: WFL     General Gait Details: initially ambulates with shuffle gait, small steps  Stairs             Wheelchair Mobility     Tilt Bed    Modified Rankin (Stroke Patients Only)       Balance Overall balance assessment: Independent                                           Pertinent Vitals/Pain Pain Assessment Pain Assessment: No/denies pain    Home Living Family/patient expects to be discharged to:: Private residence Living Arrangements: Spouse/significant other Available Help at Discharge: Family;Available 24 hours/day Type of Home: House Home Access: Stairs to enter   [employer] of Steps: 5 front with rails, 6 through garage into kitchen Alternate Level Stairs-Number of Steps: 11 Home Layout: Two level;Able to live on main level with bedroom/bathroom Home Equipment: Rolling Walker (2 wheels);Cane - single point Additional Comments: was not using AD prior to admission    Prior Function Prior Level of Function : Driving;Independent/Modified Independent             Mobility Comments: hasn't used AD in past 2 yrs ADLs Comments: indep with ADL, doesn't recall any falls (but patient admitted with fall) mod indep wtih med mgt using pill organizer     Extremity/Trunk Assessment   Upper Extremity Assessment Upper Extremity Assessment: Overall WFL for tasks assessed    Lower Extremity Assessment Lower Extremity Assessment: Overall WFL for tasks assessed    Cervical / Trunk Assessment Cervical / Trunk Assessment:  Normal  Communication   Communication Communication: No apparent difficulties Cueing Techniques: Verbal cues  Cognition Arousal: Alert Behavior During Therapy: WFL for tasks assessed/performed Overall Cognitive Status: History of cognitive impairments - at baseline                                 General Comments: mild dementia- likely at baseline        General Comments      Exercises     Assessment/Plan    PT Assessment Patient does not need any further PT services  PT Problem List          PT Treatment Interventions      PT Goals (Current goals can be found in the Care Plan section)  Acute Rehab PT Goals Patient Stated Goal: to return home, have HHPT PT Goal Formulation: With patient/family Time For Goal Achievement: 08/12/23 Potential to Achieve Goals: Good    Frequency       Co-evaluation PT/OT/SLP Co-Evaluation/Treatment: Yes Reason for Co-Treatment: For patient/therapist safety;To address functional/ADL transfers PT goals addressed during session: Mobility/safety with mobility;Balance         AM-PAC PT "6 Clicks" Mobility  Outcome Measure Help needed turning from your back to your side while in a flat bed without using bedrails?: None Help needed moving from lying on your back to sitting on the side of a flat bed without using bedrails?: None Help needed moving to and from a bed to a chair (including a wheelchair)?: None Help needed standing up from a chair using your arms (e.g., wheelchair or bedside chair)?: None Help needed to walk in hospital room?: None Help needed climbing 3-5 steps with a railing? : A Little 6 Click Score: 23    End of Session   Activity Tolerance: Patient tolerated treatment well Patient left: in chair;with call bell/phone within reach;with chair alarm set;with family/visitor present Nurse Communication: Mobility status      Time: 1355-1412 PT Time Calculation (min) (ACUTE ONLY): 17 min   Charges:   PT Evaluation $PT Eval Low Complexity: 1 Low   PT General Charges $$ ACUTE PT VISIT: 1 Visit          , PT, GCS 08/09/23,2:31 PM

## 2023-08-09 NOTE — Progress Notes (Signed)
PROGRESS NOTE    Gregg Thomas  NWG:[id number] DOB: 1931-04-01 DOA: 08/08/2023 PCP: Dorcas Carrow, DO    Brief Narrative:  89 year old male with medical history of mild dementia, muscular deconditioning, generalized weakness, B12 deficiency, hypertension, hyperlipidemia, who presents to the emergency department for chief concerns of shortness of breath and difficulty walking.   Found to have COVID infection.  Initially suspicion of [facility] pneumonia.  No specific consolidation on chest x-ray but uptrending procalcitonin.  Started on community-acquired pneumonia coverage.  Not hypoxic.   Assessment & Plan:   Principal Problem:   Dyspnea due to COVID-19 Active Problems:   Chronic anticoagulation   Benign hypertensive renal disease   Hearing loss   Chronic deep vein thrombosis (DVT) of femoral vein of left lower extremity (HCC)   Memory loss   B12 deficiency   Muscular deconditioning   Balance problem   History of melanoma   Fever   CKD stage 3b, GFR 30-44 ml/min (HCC)   Basal cell carcinoma  COVID infection Dyspnea Possible community-acquired pneumonia Patient with no evidence of overt consolidation on CXR.  Not requiring oxygen.  Work of breathing normal when at rest.  Obtain a procalcitonin. Plan: COVID isolation precautions No indication for steroids Initiate azithromycin and Rocephin for CAP coverage Monitor vitals and fever curve Ambulate with physical therapy  Basal cell carcinoma No acute issues.  Outpatient follow-up   Fever Lactic acid and urinalysis normal.  Fever likely related to COVID infection   Balance problem Fall precautions PT evaluation   Muscular deconditioning Fall precaution   B12 deficiency Per outpatient note on 08/01/2023: Patient received a B12 shot at this appointment   Chronic deep vein thrombosis (DVT) of femoral vein of left lower extremity (HCC) Home warfarin per pharmacy resumed   Benign hypertensive renal  disease Hydralazine 5 mg IV every 6 hours as needed for SBP greater 175, 4 days ordered  Dementia/cognitive decline Unknown subtype.  Suspect Alzheimer's No acute issues Outpatient follow-up  DVT prophylaxis: Lovenox Code Status: DNR.  Discussed with patient and wife at bedside Family Communication: Wife at bedside 8/9 Disposition Plan: Status is: Inpatient Remains inpatient appropriate because: Symptomatic COVID infection.  Community-acquired pneumonia on IV antibiotics.    Level of care: Telemetry Medical  Consultants:  None  Procedures:  None  Antimicrobials: Azithromycin Ceftriaxone   Subjective: Seen and examined.  Sitting up in bed.  Appears in no visible distress.  Normal work of breathing.  Objective: Vitals:   08/09/23 0026 08/09/23 0300 08/09/23 0700 08/09/23 0900  BP: (!) 156/68 (!) 150/64 (!) 139/59   Pulse:  64 65   Resp: 16   10  Temp: 98.1 F (36.7 C) 98.3 F (36.8 C) 99.3 F (37.4 C)   TempSrc: Oral Oral Oral   SpO2: 94% 93% 93%   Weight:      Height:        Intake/Output Summary (Last 24 hours) at 08/09/2023 1022 Last data filed at 08/09/2023 1000 Gross per 24 hour  Intake 120 ml  Output --  Net 120 ml   Filed Weights   08/08/23 1235  Weight: 72.9 kg    Examination:  General exam: Appears calm and comfortable  Respiratory system: Mild scattered crackles bilaterally.  Normal work of breathing.  Room air Cardiovascular system: S1-S2, RRR, no murmurs, no pedal edema Gastrointestinal system: Soft NT/ND, normal bowel sounds Central nervous system: Alert and oriented to person and place.  No focal deficits Extremities: Symmetric 5  x 5 power. Skin: No rashes, lesions or ulcers Psychiatry: Judgement and insight appear impaired. Mood & affect flattened.     Data Reviewed: I have personally reviewed following labs and imaging studies  CBC: Recent Labs  Lab 08/08/23 1245 08/09/23 0456  WBC 13.2* 10.7*  NEUTROABS 10.3*  --   HGB 15.6  14.5  HCT 45.5 41.2  MCV 96.6 94.3  PLT 162 143*   Basic Metabolic Panel: Recent Labs  Lab 08/08/23 1245 08/09/23 0456  NA 136 139  K 4.0 3.6  CL 101 107  CO2 26 24  GLUCOSE 112* 98  BUN 18 21  CREATININE 1.76* 1.60*  CALCIUM 9.5 9.0   GFR: Estimated Creatinine Clearance: 29.3 mL/min (A) (by C-G formula based on SCr of 1.6 mg/dL (H)). Liver Function Tests: Recent Labs  Lab 08/08/23 1245  AST 64*  ALT 42  ALKPHOS 120  BILITOT 1.3*  PROT 7.6  ALBUMIN 3.9   No results for input(s): "LIPASE", "AMYLASE" in the last 168 hours. No results for input(s): "AMMONIA" in the last 168 hours. Coagulation Profile: Recent Labs  Lab 08/08/23 1819 08/09/23 0456  INR 2.5* 2.4*   Cardiac Enzymes: No results for input(s): "CKTOTAL", "CKMB", "CKMBINDEX", "TROPONINI" in the last 168 hours. BNP (last 3 results) No results for input(s): "PROBNP" in the last 8760 hours. HbA1C: No results for input(s): "HGBA1C" in the last 72 hours. CBG: No results for input(s): "GLUCAP" in the last 168 hours. Lipid Profile: No results for input(s): "CHOL", "HDL", "LDLCALC", "TRIG", "CHOLHDL", "LDLDIRECT" in the last 72 hours. Thyroid Function Tests: No results for input(s): "TSH", "T4TOTAL", "FREET4", "T3FREE", "THYROIDAB" in the last 72 hours. Anemia Panel: No results for input(s): "VITAMINB12", "FOLATE", "FERRITIN", "TIBC", "IRON", "RETICCTPCT" in the last 72 hours. Sepsis Labs: Recent Labs  Lab 08/08/23 1245 08/08/23 1627 08/09/23 0456  PROCALCITON 0.25  --  0.71  LATICACIDVEN  --  1.3  --     Recent Results (from the past 240 hour(s))  Resp panel by RT-PCR (RSV, Flu A&B, Covid) Anterior Nasal Swab     Status: Abnormal   Collection Time: 08/08/23 12:45 PM   Specimen: Anterior Nasal Swab  Result Value Ref Range Status   SARS Coronavirus 2 by RT PCR POSITIVE (A) NEGATIVE Final    Comment: (NOTE) SARS-CoV-2 target nucleic acids are DETECTED.  The SARS-CoV-2 RNA is generally detectable  in upper respiratory specimens during the acute phase of infection. Positive results are indicative of the presence of the identified virus, but do not rule out bacterial infection or co-infection with other pathogens not detected by the test. Clinical correlation with patient history and other diagnostic information is necessary to determine patient infection status. The expected result is Negative.  Fact Sheet for Patients: BloggerCourse.com  Fact Sheet for Healthcare Providers: SeriousBroker.it  This test is not yet approved or cleared by the [place] FDA and  has been authorized for detection and/or diagnosis of SARS-CoV-2 by FDA under an Emergency Use Authorization (EUA).  This EUA will remain in effect (meaning this test can be used) for the duration of  the COVID-19 declaration under Section 564(b)(1) of the A ct, 21 U.S.C. section 360bbb-3(b)(1), unless the authorization is terminated or revoked sooner.     Influenza A by PCR NEGATIVE NEGATIVE Final   Influenza B by PCR NEGATIVE NEGATIVE Final    Comment: (NOTE) The Xpert Xpress SARS-CoV-2/FLU/RSV plus assay is intended as an aid in the diagnosis of influenza from Nasopharyngeal swab specimens and  should not be used as a sole basis for treatment. Nasal washings and aspirates are unacceptable for Xpert Xpress SARS-CoV-2/FLU/RSV testing.  Fact Sheet for Patients: BloggerCourse.com  Fact Sheet for Healthcare Providers: SeriousBroker.it  This test is not yet approved or cleared by the [place] FDA and has been authorized for detection and/or diagnosis of SARS-CoV-2 by FDA under an Emergency Use Authorization (EUA). This EUA will remain in effect (meaning this test can be used) for the duration of the COVID-19 declaration under Section 564(b)(1) of the Act, 21 U.S.C. section 360bbb-3(b)(1), unless the authorization  is terminated or revoked.     Resp Syncytial Virus by PCR NEGATIVE NEGATIVE Final    Comment: (NOTE) Fact Sheet for Patients: BloggerCourse.com  Fact Sheet for Healthcare Providers: SeriousBroker.it  This test is not yet approved or cleared by the [place] FDA and has been authorized for detection and/or diagnosis of SARS-CoV-2 by FDA under an Emergency Use Authorization (EUA). This EUA will remain in effect (meaning this test can be used) for the duration of the COVID-19 declaration under Section 564(b)(1) of the Act, 21 U.S.C. section 360bbb-3(b)(1), unless the authorization is terminated or revoked.  Performed at [facility], [address], [place], [place] [address]          Radiology Studies: DG Chest Portable 1 View  Result Date: 08/08/2023 CLINICAL DATA:  Shortness of breath. EXAM: PORTABLE CHEST 1 VIEW COMPARISON:  October 30, 2021. FINDINGS: The heart size and mediastinal contours are within normal limits. Both lungs are clear. The visualized skeletal structures are unremarkable. IMPRESSION: No active disease. Electronically Signed   By: Lupita Raider M.D.   On: 08/08/2023 13:06        Scheduled Meds:  guaiFENesin  600 mg Oral BID   Warfarin - Pharmacist Dosing Inpatient   Does not apply q1600   Continuous Infusions:  azithromycin     cefTRIAXone (ROCEPHIN)  IV 1 g (08/09/23 0948)     LOS: 1 day   Tresa Moore, MD [facility]   If 7PM-7AM, please contact night-coverage  08/09/2023, 10:22 AM

## 2023-08-09 NOTE — Consult Note (Signed)
ANTICOAGULATION CONSULT NOTE  Pharmacy Consult for Warfarin Indication: h/o of DVT  No Known Allergies  Patient Measurements: Height: 5' 7.01" (170.2 cm) Weight: 72.9 kg (160 lb 11.5 oz) IBW/kg (Calculated) : 66.12  Vital Signs: Temp: 99.3 F (37.4 C) (08/09 0700) Temp Source: Oral (08/09 0700) BP: 139/59 (08/09 0700) Pulse Rate: 65 (08/09 0700)  Labs: Recent Labs    08/08/23 1245 08/08/23 1819 08/09/23 0456  HGB 15.6  --  14.5  HCT 45.5  --  41.2  PLT 162  --  143*  LABPROT  --  27.5* 26.7*  INR  --  2.5* 2.4*  CREATININE 1.76*  --  1.60*   Estimated Creatinine Clearance: 29.3 mL/min (A) (by C-G formula based on SCr of 1.6 mg/dL (H)).  Medical History: Past Medical History:  Diagnosis Date   Chronic kidney disease    Clotting disorder (HCC)    Dementia (HCC)    Heart murmur    Skin cancer    Pertinent Medications:  PTA dose: Warfarin 5mg  daily, last dose: 8/7@1800   Assessment: 89 year old male with medical history of mild dementia, muscular deconditioning, generalized weakness, B12 deficiency, hypertension, hyperlipidemia, who presents to the emergency department for chief concerns of shortness of breath and difficulty walking. Patient also with history of left lower extremity chronic DVT/PE, on warfarin. Pharmacy has been consulted to continue medication while inpatient.  Goal of Therapy:  INR 2-3 Monitor platelets by anticoagulation protocol: Yes  Date: INR: Dose: 8/8 2.5      5 mg 8/9       2.4      5 mg   Plan:  - INR remains therapeutic today - Continue home dose of 5 mg tonight - Per request of patient and wife, would like to have dose scheduled @ 1800 each evening, as that is when he takes it at home - CBC/INR daily  Littie Deeds, Byrd.Champagne D PGY1 Pharmacy Resident 08/09/2023,10:40 AM

## 2023-08-10 DIAGNOSIS — R0600 Dyspnea, unspecified: Secondary | ICD-10-CM | POA: Diagnosis not present

## 2023-08-10 DIAGNOSIS — U071 COVID-19: Secondary | ICD-10-CM | POA: Diagnosis not present

## 2023-08-10 MED ORDER — VITAMIN B-12 1000 MCG PO TABS
1000.0000 ug | ORAL_TABLET | Freq: Every day | ORAL | Status: DC
  Administered 2023-08-10 – 2023-08-11 (×2): 1000 ug via ORAL
  Filled 2023-08-10 (×2): qty 1

## 2023-08-10 MED ORDER — WARFARIN SODIUM 5 MG PO TABS
5.0000 mg | ORAL_TABLET | Freq: Once | ORAL | Status: AC
  Administered 2023-08-10: 5 mg via ORAL
  Filled 2023-08-10: qty 1

## 2023-08-10 MED ORDER — GABAPENTIN 100 MG PO CAPS
100.0000 mg | ORAL_CAPSULE | Freq: Every day | ORAL | Status: DC
  Administered 2023-08-10: 100 mg via ORAL
  Filled 2023-08-10: qty 1

## 2023-08-10 MED ORDER — AMLODIPINE BESYLATE 5 MG PO TABS
2.5000 mg | ORAL_TABLET | Freq: Every day | ORAL | Status: DC
  Administered 2023-08-10 – 2023-08-11 (×2): 2.5 mg via ORAL
  Filled 2023-08-10 (×2): qty 1

## 2023-08-10 MED ORDER — ZINC SULFATE 220 (50 ZN) MG PO CAPS
220.0000 mg | ORAL_CAPSULE | Freq: Every day | ORAL | Status: DC
  Administered 2023-08-10 – 2023-08-11 (×2): 220 mg via ORAL
  Filled 2023-08-10 (×2): qty 1

## 2023-08-10 MED ORDER — VITAMIN C 500 MG PO TABS
1000.0000 mg | ORAL_TABLET | Freq: Every day | ORAL | Status: DC
  Administered 2023-08-10 – 2023-08-11 (×2): 1000 mg via ORAL
  Filled 2023-08-10 (×2): qty 2

## 2023-08-10 MED ORDER — MONTELUKAST SODIUM 10 MG PO TABS
10.0000 mg | ORAL_TABLET | Freq: Every day | ORAL | Status: DC
  Administered 2023-08-10: 10 mg via ORAL
  Filled 2023-08-10: qty 1

## 2023-08-10 MED ORDER — FLUTICASONE PROPIONATE 50 MCG/ACT NA SUSP: 2.0000 | Freq: Every day | NASAL | Status: DC | PRN

## 2023-08-10 MED ORDER — ZINC GLUCONATE 50 MG PO TABS: 50.0000 mg | ORAL_TABLET | Freq: Every day | ORAL | Status: DC

## 2023-08-10 MED ORDER — DONEPEZIL HCL 5 MG PO TABS
10.0000 mg | ORAL_TABLET | Freq: Every day | ORAL | Status: DC
  Administered 2023-08-10: 10 mg via ORAL
  Filled 2023-08-10: qty 2

## 2023-08-10 MED ORDER — LORATADINE 10 MG PO TABS
10.0000 mg | ORAL_TABLET | Freq: Every day | ORAL | Status: DC
  Administered 2023-08-10 – 2023-08-11 (×2): 10 mg via ORAL
  Filled 2023-08-10 (×2): qty 1

## 2023-08-10 NOTE — Plan of Care (Signed)

## 2023-08-10 NOTE — TOC Progression Note (Signed)
Transition of Care [facility]) - Progression Note    Patient Details  Name: Gregg Thomas MRN: [id number] Date of Birth: Oct 03, 1931  [facility]) CM/SW Contact  Allena Katz, LCSW Phone Number: 08/10/2023, 10:12 AM  Clinical Narrative:   CSW LVM with wife to discuss HH options.          Expected Discharge Plan and Services                                               Social Determinants of Health (SDOH) Interventions SDOH Screenings   Food Insecurity: No Food Insecurity (08/08/2023)  Housing: Patient Declined (08/08/2023)  Transportation Needs: No Transportation Needs (08/08/2023)  Utilities: Not At Risk (08/08/2023)  Alcohol Screen: Low Risk  (09/24/2022)  Depression (PHQ2-9): Medium Risk (08/01/2023)  Financial Resource Strain: Low Risk  (09/24/2022)  Physical Activity: Insufficiently Active (09/24/2022)  Social Connections: Moderately Integrated (09/24/2022)  Stress: No Stress Concern Present (09/24/2022)  Tobacco Use: Low Risk  (08/08/2023)    Readmission Risk Interventions     No data to display

## 2023-08-10 NOTE — Consult Note (Signed)
ANTICOAGULATION CONSULT NOTE  Pharmacy Consult for Warfarin Indication: h/o of DVT  No Known Allergies  Patient Measurements: Height: 5' 7.01" (170.2 cm) Weight: 72.9 kg (160 lb 11.5 oz) IBW/kg (Calculated) : 66.12  Vital Signs: Temp: 97.7 F (36.5 C) (08/10 0434) Temp Source: Oral (08/10 0434) BP: 170/81 (08/10 0434) Pulse Rate: 65 (08/10 0434)  Labs: Recent Labs    08/08/23 1245 08/08/23 1819 08/09/23 0456 08/10/23 0530  HGB 15.6  --  14.5 14.6  HCT 45.5  --  41.2 41.9  PLT 162  --  143* 156  LABPROT  --  27.5* 26.7* 27.9*  INR  --  2.5* 2.4* 2.6*  CREATININE 1.76*  --  1.60*  --    Estimated Creatinine Clearance: 29.3 mL/min (A) (by C-G formula based on SCr of 1.6 mg/dL (H)).  Medical History: Past Medical History:  Diagnosis Date   Chronic kidney disease    Clotting disorder (HCC)    Dementia (HCC)    Heart murmur    Skin cancer    Pertinent Medications:  PTA dose: Warfarin 5mg  daily, last dose: 8/7@1800   Assessment: 89 year old male with medical history of mild dementia, muscular deconditioning, generalized weakness, B12 deficiency, hypertension, hyperlipidemia, who presents to the emergency department for chief concerns of shortness of breath and difficulty walking. Patient also with history of left lower extremity chronic DVT/PE, on warfarin. Pharmacy has been consulted to continue medication while inpatient.  Goal of Therapy:  INR 2-3 Monitor platelets by anticoagulation protocol: Yes  Date: INR: Dose: 8/8 2.5      5 mg 8/9       2.4      5 mg 8/10 2.6    Plan:  - INR remains therapeutic today - Continue home dose of 5 mg tonight - Per request of patient and wife, would like to have dose scheduled @ 1800 each evening, as that is when he takes it at home - CBC/INR daily  Bettey Costa, PharmD Clinical Pharmacist 08/10/2023 8:17 AM

## 2023-08-10 NOTE — Progress Notes (Signed)
PROGRESS NOTE    Gregg Thomas  KZS:[id number] DOB: [birth year] DOA: 08/08/2023 PCP: Dorcas Carrow, DO    Brief Narrative:  89 year old male with medical history of mild dementia, muscular deconditioning, generalized weakness, B12 deficiency, hypertension, hyperlipidemia, who presents to the emergency department for chief concerns of shortness of breath and difficulty walking.   Found to have COVID infection.  Initially suspicion of [facility] pneumonia.  No specific consolidation on chest x-ray but uptrending procalcitonin.  Started on community-acquired pneumonia coverage.  Not hypoxic.   Assessment & Plan:   Principal Problem:   Dyspnea due to COVID-19 Active Problems:   Chronic anticoagulation   Benign hypertensive renal disease   Hearing loss   Chronic deep vein thrombosis (DVT) of femoral vein of left lower extremity (HCC)   Memory loss   B12 deficiency   Muscular deconditioning   Balance problem   History of melanoma   Fever   CKD stage 3b, GFR 30-44 ml/min (HCC)   Basal cell carcinoma  COVID infection Dyspnea Possible community-acquired pneumonia Patient with no evidence of overt consolidation on CXR.  Not requiring oxygen.  Work of breathing normal when at rest.  Improving over interval Plan: Continue respiratory isolation No indication for steroids Continue azithromycin and Rocephin Monitor vitals and fever curve Ambulate with physical therapy Anticipate discharge in 24 hours  Basal cell carcinoma No acute issues.  Outpatient follow-up   Fever Lactic acid and urinalysis normal.   Fever likely related to COVID infection Patient has defervesced   Balance problem Fall precautions PT evaluation Home health PT on discharge   Muscular deconditioning Fall precaution   B12 deficiency Patient B12 injections   Chronic deep vein thrombosis (DVT) of femoral vein of left lower extremity (HCC) Home warfarin per pharmacy resumed   Benign  hypertensive renal disease PTA amlodipine .  Hydralazine  Dementia/cognitive decline Unknown subtype.  Suspect Alzheimer's No acute issues Outpatient follow-up  DVT prophylaxis: Lovenox Code Status: DNR.  Discussed with patient and wife at bedside Family Communication: Wife at bedside 8/9, 8/10 Disposition Plan: Status is: Inpatient Remains inpatient appropriate because: Symptomatic COVID infection.  Community-acquired pneumonia on IV antibiotics.    Level of care: Telemetry Medical  Consultants:  None  Procedures:  None  Antimicrobials: Azithromycin Ceftriaxone   Subjective: Examined.  Sitting on edge of bed eating.  Wife at bedside.  Objective: Vitals:   08/09/23 1559 08/09/23 2040 08/10/23 0434 08/10/23 0915  BP: (!) 146/77 (!) 153/76 (!) 170/81 (!) 184/76  Pulse: 71 69 65 71  Resp: 17 16 16 19   Temp: 97.7 F (36.5 C) 98 F (36.7 C) 97.7 F (36.5 C) 98.2 F (36.8 C)  TempSrc: Oral Oral Oral Oral  SpO2: 94% 96% 98% 95%  Weight:      Height:        Intake/Output Summary (Last 24 hours) at 08/10/2023 0941 Last data filed at 08/09/2023 1000 Gross per 24 hour  Intake 120 ml  Output --  Net 120 ml   Filed Weights   08/08/23 1235  Weight: 72.9 kg    Examination:  General exam: NAD Respiratory system: Clear.  Normal work of breathing.  Room air Cardiovascular system: S1-S2, RRR, no murmurs, no pedal edema Gastrointestinal system: Soft NT/ND, normal bowel sounds Central nervous system: Alert and oriented to person and place.  No focal deficits Extremities: Symmetric 5 x 5 power. Skin: No rashes, lesions or ulcers Psychiatry: Judgement and insight appear impaired. Mood & affect flattened.  Data Reviewed: I have personally reviewed following labs and imaging studies  CBC: Recent Labs  Lab 08/08/23 1245 08/09/23 0456 08/10/23 0530  WBC 13.2* 10.7* 7.4  NEUTROABS 10.3*  --   --   HGB 15.6 14.5 14.6  HCT 45.5 41.2 41.9  MCV 96.6 94.3 94.8   PLT 162 143* 156   Basic Metabolic Panel: Recent Labs  Lab 08/08/23 1245 08/09/23 0456  NA 136 139  K 4.0 3.6  CL 101 107  CO2 26 24  GLUCOSE 112* 98  BUN 18 21  CREATININE 1.76* 1.60*  CALCIUM 9.5 9.0   GFR: Estimated Creatinine Clearance: 29.3 mL/min (A) (by C-G formula based on SCr of 1.6 mg/dL (H)). Liver Function Tests: Recent Labs  Lab 08/08/23 1245  AST 64*  ALT 42  ALKPHOS 120  BILITOT 1.3*  PROT 7.6  ALBUMIN 3.9   No results for input(s): "LIPASE", "AMYLASE" in the last 168 hours. No results for input(s): "AMMONIA" in the last 168 hours. Coagulation Profile: Recent Labs  Lab 08/08/23 1819 08/09/23 0456 08/10/23 0530  INR 2.5* 2.4* 2.6*   Cardiac Enzymes: No results for input(s): "CKTOTAL", "CKMB", "CKMBINDEX", "TROPONINI" in the last 168 hours. BNP (last 3 results) No results for input(s): "PROBNP" in the last 8760 hours. HbA1C: No results for input(s): "HGBA1C" in the last 72 hours. CBG: Recent Labs  Lab 08/09/23 2042  GLUCAP 94   Lipid Profile: No results for input(s): "CHOL", "HDL", "LDLCALC", "TRIG", "CHOLHDL", "LDLDIRECT" in the last 72 hours. Thyroid Function Tests: No results for input(s): "TSH", "T4TOTAL", "FREET4", "T3FREE", "THYROIDAB" in the last 72 hours. Anemia Panel: No results for input(s): "VITAMINB12", "FOLATE", "FERRITIN", "TIBC", "IRON", "RETICCTPCT" in the last 72 hours. Sepsis Labs: Recent Labs  Lab 08/08/23 1245 08/08/23 1627 08/09/23 0456  PROCALCITON 0.25  --  0.71  LATICACIDVEN  --  1.3  --     Recent Results (from the past 240 hour(s))  Resp panel by RT-PCR (RSV, Flu A&B, Covid) Anterior Nasal Swab     Status: Abnormal   Collection Time: 08/08/23 12:45 PM   Specimen: Anterior Nasal Swab  Result Value Ref Range Status   SARS Coronavirus 2 by RT PCR POSITIVE (A) NEGATIVE Final    Comment: (NOTE) SARS-CoV-2 target nucleic acids are DETECTED.  The SARS-CoV-2 RNA is generally detectable in upper  respiratory specimens during the acute phase of infection. Positive results are indicative of the presence of the identified virus, but do not rule out bacterial infection or co-infection with other pathogens not detected by the test. Clinical correlation with patient history and other diagnostic information is necessary to determine patient infection status. The expected result is Negative.  Fact Sheet for Patients: BloggerCourse.com  Fact Sheet for Healthcare Providers: SeriousBroker.it  This test is not yet approved or cleared by the [place] FDA and  has been authorized for detection and/or diagnosis of SARS-CoV-2 by FDA under an Emergency Use Authorization (EUA).  This EUA will remain in effect (meaning this test can be used) for the duration of  the COVID-19 declaration under Section 564(b)(1) of the A ct, 21 U.S.C. section 360bbb-3(b)(1), unless the authorization is terminated or revoked sooner.     Influenza A by PCR NEGATIVE NEGATIVE Final   Influenza B by PCR NEGATIVE NEGATIVE Final    Comment: (NOTE) The Xpert Xpress SARS-CoV-2/FLU/RSV plus assay is intended as an aid in the diagnosis of influenza from Nasopharyngeal swab specimens and should not be used as a sole basis for  treatment. Nasal washings and aspirates are unacceptable for Xpert Xpress SARS-CoV-2/FLU/RSV testing.  Fact Sheet for Patients: BloggerCourse.com  Fact Sheet for Healthcare Providers: SeriousBroker.it  This test is not yet approved or cleared by the [place] FDA and has been authorized for detection and/or diagnosis of SARS-CoV-2 by FDA under an Emergency Use Authorization (EUA). This EUA will remain in effect (meaning this test can be used) for the duration of the COVID-19 declaration under Section 564(b)(1) of the Act, 21 U.S.C. section 360bbb-3(b)(1), unless the authorization is  terminated or revoked.     Resp Syncytial Virus by PCR NEGATIVE NEGATIVE Final    Comment: (NOTE) Fact Sheet for Patients: BloggerCourse.com  Fact Sheet for Healthcare Providers: SeriousBroker.it  This test is not yet approved or cleared by the [place] FDA and has been authorized for detection and/or diagnosis of SARS-CoV-2 by FDA under an Emergency Use Authorization (EUA). This EUA will remain in effect (meaning this test can be used) for the duration of the COVID-19 declaration under Section 564(b)(1) of the Act, 21 U.S.C. section 360bbb-3(b)(1), unless the authorization is terminated or revoked.  Performed at [facility], [address] [place], [place] [address]          Radiology Studies: DG Chest Portable 1 View  Result Date: 08/08/2023 CLINICAL DATA:  Shortness of breath. EXAM: PORTABLE CHEST 1 VIEW COMPARISON:  October 30, 2021. FINDINGS: The heart size and mediastinal contours are within normal limits. Both lungs are clear. The visualized skeletal structures are unremarkable. IMPRESSION: No active disease. Electronically Signed   By: Lupita Raider M.D.   On: 08/08/2023 13:06        Scheduled Meds:  amLODipine  2.5 mg Oral Daily   ascorbic acid  1,000 mg Oral Daily   donepezil  10 mg Oral QHS   gabapentin  100 mg Oral QHS   guaiFENesin  600 mg Oral BID   loratadine  10 mg Oral Daily   montelukast  10 mg Oral QHS   Vitamin B-12 CR  1,000 mcg Oral Q0600   warfarin  5 mg Oral Once   Warfarin - Pharmacist Dosing Inpatient   Does not apply q1600   zinc gluconate  50 mg Oral Daily   Continuous Infusions:  azithromycin 500 mg (08/09/23 1106)   cefTRIAXone (ROCEPHIN)  IV 1 g (08/09/23 0948)     LOS: 2 days   Tresa Moore, MD [facility]   If 7PM-7AM, please contact night-coverage  08/10/2023, 9:41 AM

## 2023-08-11 DIAGNOSIS — R0600 Dyspnea, unspecified: Secondary | ICD-10-CM | POA: Diagnosis not present

## 2023-08-11 DIAGNOSIS — U071 COVID-19: Secondary | ICD-10-CM | POA: Diagnosis not present

## 2023-08-11 LAB — PROTIME-INR
INR: 2.9 — ABNORMAL HIGH (ref 0.8–1.2)
Prothrombin Time: 30.9 seconds — ABNORMAL HIGH (ref 11.4–15.2)

## 2023-08-11 MED ORDER — WARFARIN SODIUM 5 MG PO TABS
5.0000 mg | ORAL_TABLET | Freq: Once | ORAL | Status: DC
  Filled 2023-08-11: qty 1

## 2023-08-11 MED ORDER — LEVOFLOXACIN 750 MG PO TABS: 750.0000 mg | ORAL_TABLET | ORAL | 0 refills | Status: AC

## 2023-08-11 MED ORDER — GUAIFENESIN ER 600 MG PO TB12: 600.0000 mg | ORAL_TABLET | Freq: Two times a day (BID) | ORAL | 0 refills | Status: AC

## 2023-08-11 MED ORDER — ALBUTEROL SULFATE HFA 108 (90 BASE) MCG/ACT IN AERS: 2.0000 | INHALATION_SPRAY | Freq: Four times a day (QID) | RESPIRATORY_TRACT | 1 refills | Status: DC | PRN

## 2023-08-11 NOTE — Progress Notes (Signed)
Patient discharging home. All belongings sent home with patient. IV removed. Discharge education and instructions explained and all questions answered.

## 2023-08-11 NOTE — Plan of Care (Signed)

## 2023-08-11 NOTE — Discharge Summary (Signed)
Physician Discharge Summary  Peer Minns NWG:[id number] DOB: October 13, 1931 DOA: 08/08/2023  PCP: Dorcas Carrow, DO  Admit date: 08/08/2023 Discharge date: 08/11/2023  Admitted From: Home Disposition:  Home with home health  Recommendations for Outpatient Follow-up:  Follow up with PCP in 1-2 weeks   Home Health:Yes PT Equipment/Devices:None   Discharge Condition:Stable  CODE STATUS:DNR  Diet recommendation: Reg  Brief/Interim Summary:  89 year old male with medical history of mild dementia, muscular deconditioning, generalized weakness, B12 deficiency, hypertension, hyperlipidemia, who presents to the emergency department for chief concerns of shortness of breath and difficulty walking.    Found to have COVID infection.  Initially suspicion of [facility] pneumonia.  No specific consolidation on chest x-ray but uptrending procalcitonin.  Started on community-acquired pneumonia coverage.  Not hypoxic.  Weakness improved.  Patient hemodynamically stable on day of discharge.  Afebrile.  No cough.  No oxygen requirement.  Stable for discharge home at this time.  Additional p.o. antibiotics prescribed to complete total 7-day antibiotic course.  Recommend isolation precautions for COVID until Wednesday 8/14.  Recommendations discussed in detail with patient and patient's wife at bedside.  Stable for DC home.    Discharge Diagnoses:  Principal Problem:   Dyspnea due to COVID-19 Active Problems:   Chronic anticoagulation   Benign hypertensive renal disease   Hearing loss   Chronic deep vein thrombosis (DVT) of femoral vein of left lower extremity (HCC)   Memory loss   B12 deficiency   Muscular deconditioning   Balance problem   History of melanoma   Fever   CKD stage 3b, GFR 30-44 ml/min (HCC)   Basal cell carcinoma  COVID infection Dyspnea Possible community-acquired pneumonia Patient with no evidence of overt consolidation on CXR.  Not requiring oxygen.  Work of  breathing normal when at rest.  Improving over interval Plan: Stable for discharge home.  Continue respiratory isolation at home until 8/14.  Azithromycin and Rocephin discontinued at time of DC.  Prescribed Levaquin 750 mg every 48 hours for additional 2 doses to complete total 7-day antibiotic course.  Follow-up outpatient PCP 1 to 2 weeks.     Discharge Instructions  Discharge Instructions     Diet - low sodium heart healthy   Complete by: As directed    Increase activity slowly   Complete by: As directed       Allergies as of 08/11/2023   No Known Allergies      Medication List     TAKE these medications    albuterol 108 (90 Base) MCG/ACT inhaler Commonly known as: VENTOLIN HFA Inhale 2 puffs into the lungs every 6 (six) hours as needed for wheezing or shortness of breath.   amLODipine 2.5 MG tablet Commonly known as: NORVASC Take 1 tablet (2.5 mg total) by mouth daily.   ascorbic acid 1000 MG tablet Commonly known as: VITAMIN C Take by mouth.   cetirizine 5 MG tablet Commonly known as: ZYRTEC Take 1 tablet (5 mg total) by mouth daily.   donepezil 10 MG tablet Commonly known as: ARICEPT Take 1 tablet (10 mg total) by mouth at bedtime.   ELDERBERRY PO Take 1 tablet by mouth daily.   fluticasone 50 MCG/ACT nasal spray Commonly known as: FLONASE Place 2 sprays into both nostrils daily.   gabapentin 100 MG capsule Commonly known as: NEURONTIN Take 1 capsule (100 mg total) by mouth at bedtime.   Grape Seed Extract 50 MG Caps Take 1,300 mg by mouth daily.  guaiFENesin 600 MG 12 hr tablet Commonly known as: MUCINEX Take 1 tablet (600 mg total) by mouth 2 (two) times daily for 7 days.   Ipratropium-Albuterol 20-100 MCG/ACT Aers respimat Commonly known as: COMBIVENT Inhale 1 puff into the lungs every 6 (six) hours.   levofloxacin 750 MG tablet Commonly known as: Levaquin Take 1 tablet (750 mg total) by mouth every other day for 4 days. Start taking  on: August 12, 2023   montelukast 10 MG tablet Commonly known as: SINGULAIR Take 1 tablet (10 mg total) by mouth at bedtime.   VITAMIN B-12 ER PO Take 1,000 mcg by mouth daily at 6 (six) AM.   VITAMIN D3 PO Take 1 tablet by mouth daily.   warfarin 5 MG tablet Commonly known as: COUMADIN Take 1 tablet (5 mg total) by mouth one time only at 6 PM. What changed: when to take this   zinc gluconate 50 MG tablet Take by mouth.        Follow-up Information     Olevia Perches P, DO. Schedule an appointment as soon as possible for a visit in 1 week(s).   Specialty: Family Medicine Contact information: [address] [place] [address] [phone]                No Known Allergies  Consultations: None   Procedures/Studies: DG Chest Portable 1 View  Result Date: 08/08/2023 CLINICAL DATA:  Shortness of breath. EXAM: PORTABLE CHEST 1 VIEW COMPARISON:  October 30, 2021. FINDINGS: The heart size and mediastinal contours are within normal limits. Both lungs are clear. The visualized skeletal structures are unremarkable. IMPRESSION: No active disease. Electronically Signed   By: Lupita Raider M.D.   On: 08/08/2023 13:06      Subjective: Seen and examined on the day of discharge.  Stable no distress.  Appropriate for discharge home with home health services.  Wife at bedside. Discharge Exam: Vitals:   08/11/23 0529 08/11/23 0746  BP: 133/69 138/75  Pulse: (!) 57 64  Resp: 20 18  Temp: 97.8 F (36.6 C) 98 F (36.7 C)  SpO2: 96% 92%   Vitals:   08/10/23 1749 08/10/23 2120 08/11/23 0529 08/11/23 0746  BP: (!) 149/67 (!) 145/99 133/69 138/75  Pulse: (!) 59 61 (!) 57 64  Resp: 16 18 20 18   Temp: 98.8 F (37.1 C) 97.9 F (36.6 C) 97.8 F (36.6 C) 98 F (36.7 C)  TempSrc: Oral Oral Oral Oral  SpO2: 98% 98% 96% 92%  Weight:      Height:        General: Pt is alert, awake, not in acute distress Cardiovascular: RRR, S1/S2 +, no rubs, no gallops Respiratory: CTA  bilaterally, no wheezing, no rhonchi Abdominal: Soft, NT, ND, bowel sounds + Extremities: no edema, no cyanosis    The results of significant diagnostics from this hospitalization (including imaging, microbiology, ancillary and laboratory) are listed below for reference.     Microbiology: Recent Results (from the past 240 hour(s))  Resp panel by RT-PCR (RSV, Flu A&B, Covid) Anterior Nasal Swab     Status: Abnormal   Collection Time: 08/08/23 12:45 PM   Specimen: Anterior Nasal Swab  Result Value Ref Range Status   SARS Coronavirus 2 by RT PCR POSITIVE (A) NEGATIVE Final    Comment: (NOTE) SARS-CoV-2 target nucleic acids are DETECTED.  The SARS-CoV-2 RNA is generally detectable in upper respiratory specimens during the acute phase of infection. Positive results are indicative of the presence  of the identified virus, but do not rule out bacterial infection or co-infection with other pathogens not detected by the test. Clinical correlation with patient history and other diagnostic information is necessary to determine patient infection status. The expected result is Negative.  Fact Sheet for Patients: BloggerCourse.com  Fact Sheet for Healthcare Providers: SeriousBroker.it  This test is not yet approved or cleared by the [place] FDA and  has been authorized for detection and/or diagnosis of SARS-CoV-2 by FDA under an Emergency Use Authorization (EUA).  This EUA will remain in effect (meaning this test can be used) for the duration of  the COVID-19 declaration under Section 564(b)(1) of the A ct, 21 U.S.C. section 360bbb-3(b)(1), unless the authorization is terminated or revoked sooner.     Influenza A by PCR NEGATIVE NEGATIVE Final   Influenza B by PCR NEGATIVE NEGATIVE Final    Comment: (NOTE) The Xpert Xpress SARS-CoV-2/FLU/RSV plus assay is intended as an aid in the diagnosis of influenza from Nasopharyngeal swab  specimens and should not be used as a sole basis for treatment. Nasal washings and aspirates are unacceptable for Xpert Xpress SARS-CoV-2/FLU/RSV testing.  Fact Sheet for Patients: BloggerCourse.com  Fact Sheet for Healthcare Providers: SeriousBroker.it  This test is not yet approved or cleared by the [place] FDA and has been authorized for detection and/or diagnosis of SARS-CoV-2 by FDA under an Emergency Use Authorization (EUA). This EUA will remain in effect (meaning this test can be used) for the duration of the COVID-19 declaration under Section 564(b)(1) of the Act, 21 U.S.C. section 360bbb-3(b)(1), unless the authorization is terminated or revoked.     Resp Syncytial Virus by PCR NEGATIVE NEGATIVE Final    Comment: (NOTE) Fact Sheet for Patients: BloggerCourse.com  Fact Sheet for Healthcare Providers: SeriousBroker.it  This test is not yet approved or cleared by the [place] FDA and has been authorized for detection and/or diagnosis of SARS-CoV-2 by FDA under an Emergency Use Authorization (EUA). This EUA will remain in effect (meaning this test can be used) for the duration of the COVID-19 declaration under Section 564(b)(1) of the Act, 21 U.S.C. section 360bbb-3(b)(1), unless the authorization is terminated or revoked.  Performed at [facility], [address] [place], [place] [address]      Labs: BNP (last 3 results) No results for input(s): "BNP" in the last 8760 hours. Basic Metabolic Panel: Recent Labs  Lab 08/08/23 1245 08/09/23 0456  NA 136 139  K 4.0 3.6  CL 101 107  CO2 26 24  GLUCOSE 112* 98  BUN 18 21  CREATININE 1.76* 1.60*  CALCIUM 9.5 9.0   Liver Function Tests: Recent Labs  Lab 08/08/23 1245  AST 64*  ALT 42  ALKPHOS 120  BILITOT 1.3*  PROT 7.6  ALBUMIN 3.9   No results for input(s): "LIPASE", "AMYLASE" in  the last 168 hours. No results for input(s): "AMMONIA" in the last 168 hours. CBC: Recent Labs  Lab 08/08/23 1245 08/09/23 0456 08/10/23 0530  WBC 13.2* 10.7* 7.4  NEUTROABS 10.3*  --   --   HGB 15.6 14.5 14.6  HCT 45.5 41.2 41.9  MCV 96.6 94.3 94.8  PLT 162 143* 156   Cardiac Enzymes: No results for input(s): "CKTOTAL", "CKMB", "CKMBINDEX", "TROPONINI" in the last 168 hours. BNP: Invalid input(s): "POCBNP" CBG: Recent Labs  Lab 08/09/23 2042  GLUCAP 94   D-Dimer No results for input(s): "DDIMER" in the last 72 hours. Hgb A1c No results for input(s): "HGBA1C" in  the last 72 hours. Lipid Profile No results for input(s): "CHOL", "HDL", "LDLCALC", "TRIG", "CHOLHDL", "LDLDIRECT" in the last 72 hours. Thyroid function studies No results for input(s): "TSH", "T4TOTAL", "T3FREE", "THYROIDAB" in the last 72 hours.  Invalid input(s): "FREET3" Anemia work up No results for input(s): "VITAMINB12", "FOLATE", "FERRITIN", "TIBC", "IRON", "RETICCTPCT" in the last 72 hours. Urinalysis    Component Value Date/Time   COLORURINE STRAW (A) 08/08/2023 2120   APPEARANCEUR CLEAR (A) 08/08/2023 2120   APPEARANCEUR Clear 07/02/2023 1052   LABSPEC 1.003 (L) 08/08/2023 2120   PHURINE 6.0 08/08/2023 2120   GLUCOSEU 50 (A) 08/08/2023 2120   HGBUR MODERATE (A) 08/08/2023 2120   BILIRUBINUR NEGATIVE 08/08/2023 2120   BILIRUBINUR Negative 07/02/2023 1052   KETONESUR NEGATIVE 08/08/2023 2120   PROTEINUR 30 (A) 08/08/2023 2120   NITRITE NEGATIVE 08/08/2023 2120   LEUKOCYTESUR NEGATIVE 08/08/2023 2120   Sepsis Labs Recent Labs  Lab 08/08/23 1245 08/09/23 0456 08/10/23 0530  WBC 13.2* 10.7* 7.4   Microbiology Recent Results (from the past 240 hour(s))  Resp panel by RT-PCR (RSV, Flu A&B, Covid) Anterior Nasal Swab     Status: Abnormal   Collection Time: 08/08/23 12:45 PM   Specimen: Anterior Nasal Swab  Result Value Ref Range Status   SARS Coronavirus 2 by RT PCR POSITIVE (A) NEGATIVE  Final    Comment: (NOTE) SARS-CoV-2 target nucleic acids are DETECTED.  The SARS-CoV-2 RNA is generally detectable in upper respiratory specimens during the acute phase of infection. Positive results are indicative of the presence of the identified virus, but do not rule out bacterial infection or co-infection with other pathogens not detected by the test. Clinical correlation with patient history and other diagnostic information is necessary to determine patient infection status. The expected result is Negative.  Fact Sheet for Patients: BloggerCourse.com  Fact Sheet for Healthcare Providers: SeriousBroker.it  This test is not yet approved or cleared by the [place] FDA and  has been authorized for detection and/or diagnosis of SARS-CoV-2 by FDA under an Emergency Use Authorization (EUA).  This EUA will remain in effect (meaning this test can be used) for the duration of  the COVID-19 declaration under Section 564(b)(1) of the A ct, 21 U.S.C. section 360bbb-3(b)(1), unless the authorization is terminated or revoked sooner.     Influenza A by PCR NEGATIVE NEGATIVE Final   Influenza B by PCR NEGATIVE NEGATIVE Final    Comment: (NOTE) The Xpert Xpress SARS-CoV-2/FLU/RSV plus assay is intended as an aid in the diagnosis of influenza from Nasopharyngeal swab specimens and should not be used as a sole basis for treatment. Nasal washings and aspirates are unacceptable for Xpert Xpress SARS-CoV-2/FLU/RSV testing.  Fact Sheet for Patients: BloggerCourse.com  Fact Sheet for Healthcare Providers: SeriousBroker.it  This test is not yet approved or cleared by the [place] FDA and has been authorized for detection and/or diagnosis of SARS-CoV-2 by FDA under an Emergency Use Authorization (EUA). This EUA will remain in effect (meaning this test can be used) for the duration of  the COVID-19 declaration under Section 564(b)(1) of the Act, 21 U.S.C. section 360bbb-3(b)(1), unless the authorization is terminated or revoked.     Resp Syncytial Virus by PCR NEGATIVE NEGATIVE Final    Comment: (NOTE) Fact Sheet for Patients: BloggerCourse.com  Fact Sheet for Healthcare Providers: SeriousBroker.it  This test is not yet approved or cleared by the [place] FDA and has been authorized for detection and/or diagnosis of SARS-CoV-2 by FDA under an Emergency Use  Authorization (EUA). This EUA will remain in effect (meaning this test can be used) for the duration of the COVID-19 declaration under Section 564(b)(1) of the Act, 21 U.S.C. section 360bbb-3(b)(1), unless the authorization is terminated or revoked.  Performed at [facility], [address] [place], [place] [address]      Time coordinating discharge: Over 30 minutes  SIGNED:   Tresa Moore, MD  [facility] 08/11/2023, 10:23 AM Pager   If 7PM-7AM, please contact night-coverage

## 2023-08-11 NOTE — Consult Note (Addendum)
ANTICOAGULATION CONSULT NOTE  Pharmacy Consult for Warfarin Indication: h/o of DVT  No Known Allergies  Patient Measurements: Height: 5' 7.01" (170.2 cm) Weight: 72.9 kg (160 lb 11.5 oz) IBW/kg (Calculated) : 66.12  Vital Signs: Temp: 98 F (36.7 C) (08/11 0746) Temp Source: Oral (08/11 0746) BP: 138/75 (08/11 0746) Pulse Rate: 64 (08/11 0746)  Labs: Recent Labs    08/08/23 1245 08/08/23 1819 08/09/23 0456 08/10/23 0530 08/11/23 0746  HGB 15.6  --  14.5 14.6  --   HCT 45.5  --  41.2 41.9  --   PLT 162  --  143* 156  --   LABPROT  --    < > 26.7* 27.9* 30.9*  INR  --    < > 2.4* 2.6* 2.9*  CREATININE 1.76*  --  1.60*  --   --    < > = values in this interval not displayed.   Estimated Creatinine Clearance: 29.3 mL/min (A) (by C-G formula based on SCr of 1.6 mg/dL (H)).  Medical History: Past Medical History:  Diagnosis Date   Chronic kidney disease    Clotting disorder (HCC)    Dementia (HCC)    Heart murmur    Skin cancer    Pertinent Medications:  PTA dose: Warfarin 5mg  daily, last dose: 8/7@1800   Assessment: 89 year old male with medical history of mild dementia, muscular deconditioning, generalized weakness, B12 deficiency, hypertension, hyperlipidemia, who presents to the emergency department for chief concerns of shortness of breath and difficulty walking. Patient also with history of left lower extremity chronic DVT/PE, on warfarin. Pharmacy has been consulted to continue medication while inpatient.  Goal of Therapy:  INR 2-3 Monitor platelets by anticoagulation protocol: Yes  Date: INR: Dose: 8/8 2.5      5 mg 8/9       2.4      5 mg 8/10 2.6 5 mg 8/11 2.9    Plan:  - INR remains therapeutic today - Continue home dose of 5 mg tonight - Per request of patient and wife, would like to have dose scheduled @ 1800 each evening, as that is when he takes it at home - CBC/INR daily  Bettey Costa, PharmD Clinical Pharmacist 08/11/2023 9:15  AM

## 2023-08-12 ENCOUNTER — Telehealth: Payer: Self-pay

## 2023-08-12 NOTE — Transitions of Care (Post Inpatient/ED Visit) (Unsigned)
   08/12/2023  Name: Gregg Thomas MRN: [id number] DOB: [birth year]  Today's TOC FU Call Status: Today's TOC FU Call Status:: Unsuccessful Call (1st Attempt) Unsuccessful Call (1st Attempt) Date: 08/12/23  Attempted to reach the patient regarding the most recent Inpatient/ED visit.  Follow Up Plan: Additional outreach attempts will be made to reach the patient to complete the Transitions of Care (Post Inpatient/ED visit) call.   Signature Karena Addison, LPN [facility] Nurse Health Advisor Direct Dial [phone]

## 2023-08-13 NOTE — Transitions of Care (Post Inpatient/ED Visit) (Signed)
08/13/2023  Name: Gregg Thomas MRN: [id number] DOB: [birth year]  Today's TOC FU Call Status: Today's TOC FU Call Status:: Successful TOC FU Call Completed Unsuccessful Call (1st Attempt) Date: 08/12/23 [facility] FU Call Complete Date: 08/13/23  Transition Care Management Follow-up Telephone Call Date of Discharge: 08/11/23 Discharge Facility: [facility]) Type of Discharge: Inpatient Admission Primary Inpatient Discharge Diagnosis:: COVID How have you been since you were released from the hospital?: Better Any questions or concerns?: No  Items Reviewed: Did you receive and understand the discharge instructions provided?: No Medications obtained,verified, and reconciled?: Yes (Medications Reviewed) Any new allergies since your discharge?: No Dietary orders reviewed?: Yes Do you have support at home?: Yes People in Home: spouse  Medications Reviewed Today: Medications Reviewed Today     Reviewed by Karena Addison, LPN (Licensed Practical Nurse) on 08/13/23 at 1527  Med List Status: <None>   Medication Order Taking? Sig Documenting Provider Last Dose Status Informant  albuterol (VENTOLIN HFA) 108 (90 Base) MCG/ACT inhaler [id number]  Inhale 2 puffs into the lungs every 6 (six) hours as needed for wheezing or shortness of breath. Tresa Moore, MD  Active   amLODipine (NORVASC) 2.5 MG tablet [id number] No Take 1 tablet (2.5 mg total) by mouth daily. Dorcas Carrow, DO 08/07/2023 Active Spouse/Significant Other, Pharmacy Records  ascorbic acid (VITAMIN C) 1000 MG tablet [id number] No Take by mouth. [provider] 08/07/2023 Active Spouse/Significant Other, Pharmacy Records  cetirizine (ZYRTEC) 5 MG tablet [id number] No Take 1 tablet (5 mg total) by mouth daily. Olevia Perches P, DO 08/07/2023 Active Spouse/Significant Other, Pharmacy Records  Cholecalciferol (VITAMIN D3 PO) [id number] No Take 1 tablet by mouth daily. [provider]  08/07/2023 Active Spouse/Significant Other, Pharmacy Records  Cyanocobalamin (VITAMIN B-12 ER PO) [id number] No Take 1,000 mcg by mouth daily at 6 (six) AM. [provider] 08/07/2023 Active Spouse/Significant Other, Pharmacy Records  donepezil (ARICEPT) 10 MG tablet [id number] No Take 1 tablet (10 mg total) by mouth at bedtime. Dorcas Carrow, DO 08/07/2023 Active Spouse/Significant Other, Pharmacy Records  ELDERBERRY PO [id number] No Take 1 tablet by mouth daily. [provider] 08/07/2023 Active Spouse/Significant Other, Pharmacy Records  fluticasone [facility]) 50 MCG/ACT nasal spray [id number] No Place 2 sprays into both nostrils daily. Olevia Perches P, DO prn unk Active Spouse/Significant Other, Pharmacy Records           Med Note [place], TIFFANY A   Thu Aug 08, 2023  7:03 PM) Patient uses as needed  gabapentin (NEURONTIN) 100 MG capsule [id number] No Take 1 capsule (100 mg total) by mouth at bedtime. Dorcas Carrow, DO 08/07/2023 Active Spouse/Significant Other, Pharmacy Records  Grape Seed Extract 50 MG CAPS [id number] No Take 1,300 mg by mouth daily.  [provider] 08/07/2023 Active Spouse/Significant Other, Pharmacy Records           Med Note Sharia Reeve   Tue Oct 31, 2021 10:11 AM)    guaiFENesin (MUCINEX) 600 MG 12 hr tablet [id number]  Take 1 tablet (600 mg total) by mouth 2 (two) times daily for 7 days. Tresa Moore, MD  Active   Ipratropium-Albuterol (COMBIVENT) 20-100 MCG/ACT AERS respimat [id number] No Inhale 1 puff into the lungs every 6 (six) hours. [provider] prn unk Active Spouse/Significant Other, Pharmacy Records           Med Note [facility], [facility] A   Thu Aug 08, 2023  7:03 PM) Uses as needed  levofloxacin [facility])  750 MG tablet [id number]  Take 1 tablet (750 mg total) by mouth every other day for 4 days. Tresa Moore, MD  Active   montelukast (SINGULAIR) 10 MG tablet [id number] No Take 1 tablet (10 mg total) by mouth at bedtime.  Dorcas Carrow, DO 08/07/2023 Active Spouse/Significant Other, Pharmacy Records  warfarin (COUMADIN) 5 MG tablet [id number] No Take 1 tablet (5 mg total) by mouth one time only at 6 PM.  Patient taking differently: Take 5 mg by mouth daily.   Earna Coder, MD 08/07/2023 1800 Active Spouse/Significant Other, Pharmacy Records           Med Note [facility], [facility] A   Thu Aug 08, 2023  7:02 PM) Patient takes at 6pm every night.  zinc gluconate 50 MG tablet [id number] No Take by mouth. [provider] 08/07/2023 Active Spouse/Significant Other, Pharmacy Records            Home Care and Equipment/Supplies: Were Home Health Services Ordered?: NA Any new equipment or medical supplies ordered?: NA  Functional Questionnaire: Do you need assistance with bathing/showering or dressing?: No Do you need assistance with meal preparation?: No Do you need assistance with eating?: No Do you have difficulty maintaining continence: No Do you need assistance with getting out of bed/getting out of a chair/moving?: No Do you have difficulty managing or taking your medications?: No  Follow up appointments reviewed: PCP Follow-up appointment confirmed?: Yes Date of PCP follow-up appointment?: 08/15/23 Follow-up Provider: [facility] Follow-up appointment confirmed?: NA Do you need transportation to your follow-up appointment?: No Do you understand care options if your condition(s) worsen?: Yes-patient verbalized understanding    SIGNATURE Karena Addison, LPN [facility] Nurse Health Advisor Direct Dial [phone]

## 2023-08-14 ENCOUNTER — Other Ambulatory Visit: Payer: Medicare Other

## 2023-08-15 ENCOUNTER — Ambulatory Visit: Payer: Medicare Other | Admitting: Family Medicine

## 2023-08-15 ENCOUNTER — Encounter: Payer: Self-pay | Admitting: Family Medicine

## 2023-08-15 VITALS — BP 119/76 | HR 57 | Temp 97.6°F | Wt 155.0 lb

## 2023-08-15 DIAGNOSIS — J1282 Pneumonia due to coronavirus disease 2019: Secondary | ICD-10-CM

## 2023-08-15 DIAGNOSIS — U071 COVID-19: Secondary | ICD-10-CM | POA: Diagnosis not present

## 2023-08-15 NOTE — Progress Notes (Signed)
BP 119/76   Pulse (!) 57   Temp 97.6 F (36.4 C) (Oral)   Wt 155 lb (70.3 kg)   SpO2 98%   BMI 24.27 kg/m    Subjective:    Patient ID: Gregg Thomas, male    DOB: May 04, 1931, 89 y.o.   MRN: [id number]  HPI: Gregg Thomas is a 89 y.o. male  Chief Complaint  Patient presents with   Hospitalization Follow-up   Transition of Care Hospital Follow up.   Hospital/Facility: [facility] D/C Physician: Dr. Georgeann Oppenheim D/C Date: 08/11/23  Records Requested: 08/15/23 Records Received: 08/15/23 Records Reviewed: 08/15/23  Diagnoses on Discharge:  Dyspnea due to COVID-19   Chronic anticoagulation   Benign hypertensive renal disease   Hearing loss   Chronic deep vein thrombosis (DVT) of femoral vein of left lower extremity (HCC)   Memory loss   B12 deficiency   Muscular deconditioning   Balance problem   History of melanoma   Fever   CKD stage 3b, GFR 30-44 ml/min (HCC)   Basal cell carcinoma  Date of interactive Contact within 48 hours of discharge: 08/12/23 Contact was through: phone  Date of 7 day or 14 day face-to-face visit: 08/15/23   within 7 days  Outpatient Encounter Medications as of 08/15/2023  Medication Sig Note   albuterol (VENTOLIN HFA) 108 (90 Base) MCG/ACT inhaler Inhale 2 puffs into the lungs every 6 (six) hours as needed for wheezing or shortness of breath.    amLODipine (NORVASC) 2.5 MG tablet Take 1 tablet (2.5 mg total) by mouth daily.    ascorbic acid (VITAMIN C) 1000 MG tablet Take by mouth.    cetirizine (ZYRTEC) 5 MG tablet Take 1 tablet (5 mg total) by mouth daily.    Cholecalciferol (VITAMIN D3 PO) Take 1 tablet by mouth daily.    Cyanocobalamin (VITAMIN B-12 ER PO) Take 1,000 mcg by mouth daily at 6 (six) AM.    donepezil (ARICEPT) 10 MG tablet Take 1 tablet (10 mg total) by mouth at bedtime.    ELDERBERRY PO Take 1 tablet by mouth daily.    fluticasone (FLONASE) 50 MCG/ACT nasal spray Place 2 sprays into both nostrils daily. 08/08/2023:  Patient uses as needed   gabapentin (NEURONTIN) 100 MG capsule Take 1 capsule (100 mg total) by mouth at bedtime.    Grape Seed Extract 50 MG CAPS Take 1,300 mg by mouth daily.     guaiFENesin (MUCINEX) 600 MG 12 hr tablet Take 1 tablet (600 mg total) by mouth 2 (two) times daily for 7 days.    Ipratropium-Albuterol (COMBIVENT) 20-100 MCG/ACT AERS respimat Inhale 1 puff into the lungs every 6 (six) hours. 08/08/2023: Uses as needed   levofloxacin (LEVAQUIN) 750 MG tablet Take 1 tablet (750 mg total) by mouth every other day for 4 days.    montelukast (SINGULAIR) 10 MG tablet Take 1 tablet (10 mg total) by mouth at bedtime.    warfarin (COUMADIN) 5 MG tablet Take 1 tablet (5 mg total) by mouth one time only at 6 PM. (Patient taking differently: Take 5 mg by mouth daily.) 08/08/2023: Patient takes at 6pm every night.   zinc gluconate 50 MG tablet Take by mouth.    No facility-administered encounter medications on file as of 08/15/2023.  Per Hospitalist: "Brief/Interim Summary: 89 year old male with medical history of mild dementia, muscular deconditioning, generalized weakness, B12 deficiency, hypertension, hyperlipidemia, who presents to the emergency department for chief concerns of shortness of breath and difficulty walking.  Found to have COVID infection.  Initially suspicion of [facility] pneumonia.  No specific consolidation on chest x-ray but uptrending procalcitonin.  Started on community-acquired pneumonia coverage.  Not hypoxic.   Weakness improved.  Patient hemodynamically stable on day of discharge.  Afebrile.  No cough.  No oxygen requirement.  Stable for discharge home at this time.  Additional p.o. antibiotics prescribed to complete total 7-day antibiotic course.  Recommend isolation precautions for COVID until Wednesday 8/14.  Recommendations discussed in detail with patient and patient's wife at bedside.  Stable for DC home. COVID infection Dyspnea Possible community-acquired  pneumonia Patient with no evidence of overt consolidation on CXR.  Not requiring oxygen.  Work of breathing normal when at rest.  Improving over interval Plan: Stable for discharge home.  Continue respiratory isolation at home until 8/14.  Azithromycin and Rocephin discontinued at time of DC.  Prescribed Levaquin 750 mg every 48 hours for additional 2 doses to complete total 7-day antibiotic course.  Follow-up outpatient PCP 1 to 2 weeks. "  Diagnostic Tests Reviewed: CLINICAL DATA:  Shortness of breath.   EXAM: PORTABLE CHEST 1 VIEW   COMPARISON:  October 30, 2021.   FINDINGS: The heart size and mediastinal contours are within normal limits. Both lungs are clear. The visualized skeletal structures are unremarkable.   IMPRESSION: No active disease.  Disposition: Home  Consults:  None  Discharge Instructions: Follow up here in 1-2 weeks  Disease/illness Education: Discussed today  Home Health/Community Services Discussions/Referrals: in place  Establishment or re-establishment of referral orders for community resources: in place  Discussion with other health care providers: N/A  Assessment and Support of treatment regimen adherence: Good  Appointments Coordinated with:  Patient and wife  Education for self-management, independent living, and ADLs:  Discussed today  Since getting out of the hospital, Gregg Thomas has been feeling better. Not coughing. Feeling stronger.   Relevant past medical, surgical, family and social history reviewed and updated as indicated. Interim medical history since our last visit reviewed. Allergies and medications reviewed and updated.  Review of Systems  Constitutional: Negative.   Respiratory: Negative.    Cardiovascular: Negative.   Gastrointestinal: Negative.   Musculoskeletal: Negative.   Neurological: Negative.   Psychiatric/Behavioral: Negative.      Per HPI unless specifically indicated above     Objective:    BP 119/76   Pulse  (!) 57   Temp 97.6 F (36.4 C) (Oral)   Wt 155 lb (70.3 kg)   SpO2 98%   BMI 24.27 kg/m   Wt Readings from Last 3 Encounters:  08/15/23 155 lb (70.3 kg)  08/08/23 160 lb 11.5 oz (72.9 kg)  08/01/23 155 lb 12.8 oz (70.7 kg)    Physical Exam Vitals and nursing note reviewed.  Constitutional:      General: He is not in acute distress.    Appearance: Normal appearance. He is normal weight. He is not ill-appearing, toxic-appearing or diaphoretic.  HENT:     Head: Normocephalic and atraumatic.     Right Ear: External ear normal.     Left Ear: External ear normal.     Nose: Nose normal.     Mouth/Throat:     Mouth: Mucous membranes are moist.     Pharynx: Oropharynx is clear.  Eyes:     General: No scleral icterus.       Right eye: No discharge.        Left eye: No discharge.     Extraocular Movements: Extraocular  movements intact.     Conjunctiva/sclera: Conjunctivae normal.     Pupils: Pupils are equal, round, and reactive to light.  Cardiovascular:     Rate and Rhythm: Normal rate and regular rhythm.     Pulses: Normal pulses.     Heart sounds: Normal heart sounds. No murmur heard.    No friction rub. No gallop.  Pulmonary:     Effort: Pulmonary effort is normal. No respiratory distress.     Breath sounds: Normal breath sounds. No stridor. No wheezing, rhonchi or rales.  Chest:     Chest wall: No tenderness.  Musculoskeletal:        General: Normal range of motion.     Cervical back: Normal range of motion and neck supple.  Skin:    General: Skin is warm and dry.     Capillary Refill: Capillary refill takes less than 2 seconds.     Coloration: Skin is not jaundiced or pale.     Findings: No bruising, erythema, lesion or rash.  Neurological:     General: No focal deficit present.     Mental Status: He is alert and oriented to person, place, and time. Mental status is at baseline.  Psychiatric:        Mood and Affect: Mood normal.        Behavior: Behavior normal.         Thought Content: Thought content normal.        Judgment: Judgment normal.     Results for orders placed or performed in visit on 08/15/23  CBC with Differential/Platelet  Result Value Ref Range   WBC 6.2 3.4 - 10.8 x10E3/uL   RBC 4.24 4.14 - 5.80 x10E6/uL   Hemoglobin 13.5 13.0 - 17.7 g/dL   Hematocrit 96.2 95.2 - 51.0 %   MCV 97 79 - 97 fL   MCH 31.8 26.6 - 33.0 pg   MCHC 32.9 31.5 - 35.7 g/dL   RDW 84.1 32.4 - 40.1 %   Platelets 276 150 - 450 x10E3/uL   Neutrophils 55 Not Estab. %   Lymphs 29 Not Estab. %   Monocytes 10 Not Estab. %   Eos 4 Not Estab. %   Basos 1 Not Estab. %   Neutrophils Absolute 3.5 1.4 - 7.0 x10E3/uL   Lymphocytes Absolute 1.8 0.7 - 3.1 x10E3/uL   Monocytes Absolute 0.6 0.1 - 0.9 x10E3/uL   EOS (ABSOLUTE) 0.2 0.0 - 0.4 x10E3/uL   Basophils Absolute 0.0 0.0 - 0.2 x10E3/uL   Immature Granulocytes 1 Not Estab. %   Immature Grans (Abs) 0.1 0.0 - 0.1 x10E3/uL  Basic metabolic panel  Result Value Ref Range   Glucose 99 70 - 99 mg/dL   BUN 20 8 - 27 mg/dL   Creatinine, Ser 0.27 (H) 0.76 - 1.27 mg/dL   eGFR 37 (L) >25 DG/UYQ/0.34   BUN/Creatinine Ratio 11 10 - 24   Sodium 143 134 - 144 mmol/L   Potassium 5.0 3.5 - 5.2 mmol/L   Chloride 107 (H) 96 - 106 mmol/L   CO2 24 20 - 29 mmol/L   Calcium 9.4 8.6 - 10.2 mg/dL      Assessment & Plan:   Problem List Items Addressed This Visit   None Visit Diagnoses     Pneumonia due to COVID-19 virus    -  Primary   Lungs clear. Feeling significatly better. Will check CBC and BMP today. Call with any concerns.   Relevant Orders   CBC with  Differential/Platelet (Completed)   Basic metabolic panel (Completed)        Follow up plan: Return as scheduled.

## 2023-08-16 ENCOUNTER — Encounter: Payer: Self-pay | Admitting: Family Medicine

## 2023-08-16 LAB — CBC WITH DIFFERENTIAL/PLATELET
Basophils Absolute: 0 10*3/uL (ref 0.0–0.2)
Basos: 1 %
EOS (ABSOLUTE): 0.2 10*3/uL (ref 0.0–0.4)
Eos: 4 %
Hematocrit: 41 % (ref 37.5–51.0)
Hemoglobin: 13.5 g/dL (ref 13.0–17.7)
Immature Grans (Abs): 0.1 10*3/uL (ref 0.0–0.1)
Immature Granulocytes: 1 %
Lymphocytes Absolute: 1.8 10*3/uL (ref 0.7–3.1)
Lymphs: 29 %
MCH: 31.8 pg (ref 26.6–33.0)
MCHC: 32.9 g/dL (ref 31.5–35.7)
MCV: 97 fL (ref 79–97)
Monocytes Absolute: 0.6 10*3/uL (ref 0.1–0.9)
Monocytes: 10 %
Neutrophils Absolute: 3.5 10*3/uL (ref 1.4–7.0)
Neutrophils: 55 %
Platelets: 276 10*3/uL (ref 150–450)
RBC: 4.24 x10E6/uL (ref 4.14–5.80)
RDW: 13.1 % (ref 11.6–15.4)
WBC: 6.2 10*3/uL (ref 3.4–10.8)

## 2023-08-16 LAB — BASIC METABOLIC PANEL
BUN/Creatinine Ratio: 11 (ref 10–24)
BUN: 20 mg/dL (ref 8–27)
CO2: 24 mmol/L (ref 20–29)
Calcium: 9.4 mg/dL (ref 8.6–10.2)
Chloride: 107 mmol/L — ABNORMAL HIGH (ref 96–106)
Creatinine, Ser: 1.75 mg/dL — ABNORMAL HIGH (ref 0.76–1.27)
Glucose: 99 mg/dL (ref 70–99)
Potassium: 5 mmol/L (ref 3.5–5.2)
Sodium: 143 mmol/L (ref 134–144)
eGFR: 37 mL/min/{1.73_m2} — ABNORMAL LOW (ref >59)

## 2023-09-04 ENCOUNTER — Inpatient Hospital Stay (HOSPITAL_BASED_OUTPATIENT_CLINIC_OR_DEPARTMENT_OTHER): Payer: Medicare Other | Admitting: Internal Medicine

## 2023-09-04 ENCOUNTER — Inpatient Hospital Stay: Payer: Medicare Other | Attending: Internal Medicine

## 2023-09-04 ENCOUNTER — Encounter: Payer: Self-pay | Admitting: Internal Medicine

## 2023-09-04 VITALS — BP 137/68 | HR 57 | Temp 98.1°F | Ht 67.01 in | Wt 156.6 lb

## 2023-09-04 DIAGNOSIS — R5383 Other fatigue: Secondary | ICD-10-CM | POA: Diagnosis not present

## 2023-09-04 DIAGNOSIS — C44310 Basal cell carcinoma of skin of unspecified parts of face: Secondary | ICD-10-CM | POA: Insufficient documentation

## 2023-09-04 DIAGNOSIS — I82512 Chronic embolism and thrombosis of left femoral vein: Secondary | ICD-10-CM

## 2023-09-04 DIAGNOSIS — Z85828 Personal history of other malignant neoplasm of skin: Secondary | ICD-10-CM | POA: Diagnosis not present

## 2023-09-04 DIAGNOSIS — Z8616 Personal history of COVID-19: Secondary | ICD-10-CM | POA: Insufficient documentation

## 2023-09-04 DIAGNOSIS — Z823 Family history of stroke: Secondary | ICD-10-CM | POA: Insufficient documentation

## 2023-09-04 DIAGNOSIS — M2550 Pain in unspecified joint: Secondary | ICD-10-CM | POA: Insufficient documentation

## 2023-09-04 DIAGNOSIS — N1832 Chronic kidney disease, stage 3b: Secondary | ICD-10-CM | POA: Insufficient documentation

## 2023-09-04 DIAGNOSIS — R413 Other amnesia: Secondary | ICD-10-CM | POA: Insufficient documentation

## 2023-09-04 DIAGNOSIS — Z7901 Long term (current) use of anticoagulants: Secondary | ICD-10-CM | POA: Insufficient documentation

## 2023-09-04 DIAGNOSIS — Z8249 Family history of ischemic heart disease and other diseases of the circulatory system: Secondary | ICD-10-CM | POA: Diagnosis not present

## 2023-09-04 DIAGNOSIS — Z79899 Other long term (current) drug therapy: Secondary | ICD-10-CM | POA: Diagnosis not present

## 2023-09-04 DIAGNOSIS — M791 Myalgia, unspecified site: Secondary | ICD-10-CM | POA: Insufficient documentation

## 2023-09-04 DIAGNOSIS — Z86711 Personal history of pulmonary embolism: Secondary | ICD-10-CM | POA: Insufficient documentation

## 2023-09-04 DIAGNOSIS — Z85820 Personal history of malignant melanoma of skin: Secondary | ICD-10-CM | POA: Insufficient documentation

## 2023-09-04 LAB — BASIC METABOLIC PANEL - CANCER CENTER ONLY
Anion gap: 7 (ref 5–15)
BUN: 22 mg/dL (ref 8–23)
CO2: 24 mmol/L (ref 22–32)
Calcium: 9 mg/dL (ref 8.9–10.3)
Chloride: 108 mmol/L (ref 98–111)
Creatinine: 1.9 mg/dL — ABNORMAL HIGH (ref 0.61–1.24)
GFR, Estimated: 33 mL/min — ABNORMAL LOW (ref >60)
Glucose, Bld: 89 mg/dL (ref 70–99)
Potassium: 3.9 mmol/L (ref 3.5–5.1)
Sodium: 139 mmol/L (ref 135–145)

## 2023-09-04 LAB — CBC WITH DIFFERENTIAL (CANCER CENTER ONLY)
Abs Immature Granulocytes: 0.01 10*3/uL (ref 0.00–0.07)
Basophils Absolute: 0 10*3/uL (ref 0.0–0.1)
Basophils Relative: 0 %
Eosinophils Absolute: 0.2 10*3/uL (ref 0.0–0.5)
Eosinophils Relative: 5 %
HCT: 40.1 % (ref 39.0–52.0)
Hemoglobin: 13.2 g/dL (ref 13.0–17.0)
Immature Granulocytes: 0 %
Lymphocytes Relative: 32 %
Lymphs Abs: 1.4 10*3/uL (ref 0.7–4.0)
MCH: 32.8 pg (ref 26.0–34.0)
MCHC: 32.9 g/dL (ref 30.0–36.0)
MCV: 99.5 fL (ref 80.0–100.0)
Monocytes Absolute: 0.6 10*3/uL (ref 0.1–1.0)
Monocytes Relative: 14 %
Neutro Abs: 2.2 10*3/uL (ref 1.7–7.7)
Neutrophils Relative %: 49 %
Platelet Count: 168 10*3/uL (ref 150–400)
RBC: 4.03 MIL/uL — ABNORMAL LOW (ref 4.22–5.81)
RDW: 13.7 % (ref 11.5–15.5)
WBC Count: 4.5 10*3/uL (ref 4.0–10.5)
nRBC: 0 % (ref 0.0–0.2)

## 2023-09-04 LAB — PROTIME-INR
INR: 2.8 — ABNORMAL HIGH (ref 0.8–1.2)
Prothrombin Time: 29.7 s — ABNORMAL HIGH (ref 11.4–15.2)

## 2023-09-04 NOTE — Progress Notes (Signed)
[facility] CONSULT NOTE  Patient Care Team: Dorcas Carrow, DO as PCP - General (Family Medicine) Rosey Bath, MD (Inactive) as Referring Physician (Hematology and Oncology) Camillo Flaming, MD as Referring Physician (Internal Medicine) Mack Hook, MD as Referring Physician (Dermatology) Earna Coder, MD as Consulting Physician (Hematology and Oncology)  CHIEF COMPLAINTS/PURPOSE OF CONSULTATION: Left lower extremity DVT  #  Oncology History Overview Note  #Left lower extremity chronic DVT/PE [2011]-on Coumadin [Drs.Gittin/Corcoran]  #2021-Left facial basal cell carcinoma ERIVEDGE- [Dr.Dasher]  #CKD [Dr.Lateef]/    Squamous cell carcinoma, 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  Accompanied by his wife.  Gregg Thomas 89 y.o.  male history of chronic left lower extremity DVT on Coumadin and history of CKD-III-IV; on erivedge for [facility] is here for follow-up.   Currently doing physical therapy twice a week at home for balance.   Patient was in  [facility] for covid/pneumonia in August. Patient Currently on  Coumadin dose is 5 mg QD.  Patient continues to be on erivedge continuously [previously intermittently] for basal cell carcinoma.  Otherwise no recent hospitalizations. Denies any weight loss.  Denies any nausea vomiting.  No diarrhea.  Denies any blood in stools or black or stools.  No nausea no vomiting.  Review of Systems  Constitutional:  Positive for malaise/fatigue. Negative for chills, diaphoresis, fever and weight loss.  HENT:  Negative for nosebleeds and sore throat.   Eyes:  Negative for double vision.  Respiratory:  Negative for cough, hemoptysis, sputum production, shortness of breath and wheezing.   Cardiovascular:  Negative for chest pain, palpitations, orthopnea and leg swelling.  Gastrointestinal:  Negative for abdominal pain,  blood in stool, constipation, diarrhea, heartburn, melena, nausea and vomiting.  Genitourinary:  Negative for dysuria, frequency and urgency.  Musculoskeletal:  Positive for joint pain and myalgias. Negative for back pain.  Skin: Negative.  Negative for itching and rash.  Neurological:  Negative for dizziness, tingling, focal weakness, weakness and headaches.  Endo/Heme/Allergies:  Bruises/bleeds easily.  Psychiatric/Behavioral:  Positive for memory loss. Negative for depression. The patient is not nervous/anxious and does not have insomnia.      MEDICAL HISTORY:  Past Medical History:  Diagnosis Date   Chronic kidney disease    Clotting disorder (HCC)    Dementia (HCC)    Heart murmur    Skin cancer     SURGICAL HISTORY: Past Surgical History:  Procedure Laterality Date   biopsy Right 08/26/2019   temple biopsy - dr.dasher    EYE SURGERY Right    benign growth on right eye    KNEE SURGERY Right    MELANOMA EXCISION Bilateral    lower back     SOCIAL HISTORY: Social History   Socioeconomic History   Marital status: Married    Spouse name: Not on file   Number of children: Not on file   Years of education: 13 years    Highest education level: Some college, no degree  Occupational History   Occupation: retired  Tobacco Use   Smoking status: Never   Smokeless tobacco: Never  Vaping Use   Vaping status: Never Used  Substance and Sexual Activity   Alcohol use: No   Drug use: No   Sexual activity: Not Currently  Other Topics Concern   Not on file  Social History Narrative   [employer]    [place]  [employer]       Golfing 2 times a week, alk 3 times a week    Social Determinants of Health   Financial Resource Strain: Low Risk  (09/24/2022)   Overall Financial Resource Strain (CARDIA)    Difficulty of Paying Living Expenses: Not hard at all  Food Insecurity: No Food Insecurity (08/08/2023)   Hunger Vital Sign    Worried About Running Out of  Food in the Last Year: Never true    Ran Out of Food in the Last Year: Never true  Transportation Needs: No Transportation Needs (08/08/2023)   PRAPARE - Administrator, Civil Service (Medical): No    Lack of Transportation (Non-Medical): No  Physical Activity: Insufficiently Active (09/24/2022)   Exercise Vital Sign    Days of Exercise per Week: 2 days    Minutes of Exercise per Session: 20 min  Stress: No Stress Concern Present (09/24/2022)   [employer] of Occupational Health - Occupational Stress Questionnaire    Feeling of Stress : Not at all  Social Connections: Moderately Integrated (09/24/2022)   Social Connection and Isolation Panel [NHANES]    Frequency of Communication with Friends and Family: Once a week    Frequency of Social Gatherings with Friends and Family: Once a week    Attends Religious Services: More than 4 times per year    Active Member of [employer] or Organizations: No    Attends Engineer, structural: More than 4 times per year    Marital Status: Married  Catering manager Violence: Not At Risk (08/08/2023)   Humiliation, Afraid, Rape, and Kick questionnaire    Fear of Current or Ex-Partner: No    Emotionally Abused: No    Physically Abused: No    Sexually Abused: No    FAMILY HISTORY: Family History  Problem Relation Age of Onset   Stroke Mother    Cerebral aneurysm Father     ALLERGIES:  has No Known Allergies.  MEDICATIONS:  Current Outpatient Medications  Medication Sig Dispense Refill   albuterol (VENTOLIN HFA) 108 (90 Base) MCG/ACT inhaler Inhale 2 puffs into the lungs every 6 (six) hours as needed for wheezing or shortness of breath. 8 g 1   amLODipine (NORVASC) 2.5 MG tablet Take 1 tablet (2.5 mg total) by mouth daily. 90 tablet 1   ascorbic acid (VITAMIN C) 1000 MG tablet Take by mouth.     cetirizine (ZYRTEC) 5 MG tablet Take 1 tablet (5 mg total) by mouth daily. 90 tablet 3   Cholecalciferol (VITAMIN D3 PO) Take 1 tablet by  mouth daily.     Cyanocobalamin (VITAMIN B-12 ER PO) Take 1,000 mcg by mouth daily at 6 (six) AM.     donepezil (ARICEPT) 10 MG tablet Take 1 tablet (10 mg total) by mouth at bedtime. 90 tablet 1   ELDERBERRY PO Take 1 tablet by mouth daily.     fluticasone (FLONASE) 50 MCG/ACT nasal spray Place 2 sprays into both nostrils daily. 16 g 6   gabapentin (NEURONTIN) 100 MG capsule Take 1 capsule (100 mg total) by mouth at bedtime.     Grape Seed Extract 50 MG CAPS Take 1,300 mg by mouth daily.      Ipratropium-Albuterol (COMBIVENT) 20-100 MCG/ACT AERS respimat Inhale 1 puff into the lungs every 6 (six) hours.     montelukast (SINGULAIR) 10 MG tablet Take 1 tablet (10 mg total) by mouth at bedtime. 90 tablet 1   warfarin (COUMADIN) 5 MG tablet  Take 1 tablet (5 mg total) by mouth one time only at 6 PM. (Patient taking differently: Take 5 mg by mouth daily.) 90 tablet 1   zinc gluconate 50 MG tablet Take by mouth.     No current facility-administered medications for this visit.      Marland Kitchen  PHYSICAL EXAMINATION: ECOG PERFORMANCE STATUS: 0 - Asymptomatic  Vitals:   09/04/23 1325  BP: 137/68  Pulse: (!) 57  Temp: 98.1 F (36.7 C)  SpO2: 100%    Filed Weights   09/04/23 1325  Weight: 156 lb 9.6 oz (71 kg)     Physical Exam Constitutional:      Comments: Elderly appearing Caucasian male patient accompanied by his wife.  Is walking himself.  HENT:     Head: Normocephalic and atraumatic.     Mouth/Throat:     Pharynx: No oropharyngeal exudate.  Eyes:     Pupils: Pupils are equal, round, and reactive to light.  Cardiovascular:     Rate and Rhythm: Normal rate and regular rhythm.  Pulmonary:     Effort: Pulmonary effort is normal. No respiratory distress.     Breath sounds: Normal breath sounds. No wheezing.  Abdominal:     General: Bowel sounds are normal. There is no distension.     Palpations: Abdomen is soft. There is no mass.     Tenderness: There is no abdominal tenderness.  There is no guarding or rebound.  Musculoskeletal:        General: No tenderness. Normal range of motion.     Cervical back: Normal range of motion and neck supple.  Skin:    General: Skin is warm.  Neurological:     Mental Status: He is alert and oriented to person, place, and time.  Psychiatric:        Mood and Affect: Affect normal.      LABORATORY DATA:  I have reviewed the data as listed Lab Results  Component Value Date   WBC 4.5 09/04/2023   HGB 13.2 09/04/2023   HCT 40.1 09/04/2023   MCV 99.5 09/04/2023   PLT 168 09/04/2023   Recent Labs    07/02/23 1101 08/08/23 1245 08/09/23 0456 08/15/23 1633 09/04/23 1325  NA 146* 136 139 143 139  K 4.2 4.0 3.6 5.0 3.9  CL 107* 101 107 107* 108  CO2 24 26 24 24 24   GLUCOSE 94 112* 98 99 89  BUN 15 18 21 20 22   CREATININE 1.88* 1.76* 1.60* 1.75* 1.90*  CALCIUM 9.5 9.5 9.0 9.4 9.0  GFRNONAA  --  37* 41*  --  33*  PROT 6.5 7.6  --   --   --   ALBUMIN 3.8 3.9  --   --   --   AST 33 64*  --   --   --   ALT 30 42  --   --   --   ALKPHOS 160* 120  --   --   --   BILITOT 0.5 1.3*  --   --   --     RADIOGRAPHIC STUDIES: I have personally reviewed the radiological images as listed and agreed with the findings in the report. DG Chest Portable 1 View  Result Date: 08/08/2023 CLINICAL DATA:  Shortness of breath. EXAM: PORTABLE CHEST 1 VIEW COMPARISON:  October 30, 2021. FINDINGS: The heart size and mediastinal contours are within normal limits. Both lungs are clear. The visualized skeletal structures are unremarkable. IMPRESSION: No active disease. Electronically Signed  By: Lupita Raider M.D.   On: 08/08/2023 13:06    ASSESSMENT & PLAN:   Chronic deep vein thrombosis (DVT) of femoral vein of left lower extremity (HCC) # Left lower extremity chronic DVT/PE [2011]-no new thromboembolic events. Stable- on Coumadin/warfarin- on 5 mg a day- [no major fluctuations noted on hedgehog inhibitor].  INR from today-2.8. continue  currentt dose at 5 mg/day.   # Basal cell carcinoma: on Hedgehog inhibitor [Dr.Dasher]-stable.   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 stable.   #CKD-III [GFR 30s]-STABLE; Dr.Lateef-  stable.   # DISPOSITION: # PT/INR every month x 6  # in 6 months- MD; cbc/bmp/Pt/INR-Dr.B  Cc;    All questions were answered. The patient knows to call the clinic with any problems, questions or concerns.    Earna Coder, MD 09/04/2023 2:11 PM

## 2023-09-04 NOTE — Progress Notes (Signed)
Doing physical therapy twice a week at home for balance.  Was tx at [facility] for covid/pneumonia in August.

## 2023-09-04 NOTE — Assessment & Plan Note (Signed)
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 Stable- on Coumadin/warfarin- on 5 mg a day- [no major fluctuations noted on hedgehog inhibitor].  INR from today-2.8. continue currentt dose at 5 mg/day.   # Basal cell carcinoma: on Hedgehog inhibitor [Dr.Dasher]-stable.   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 stable.   #CKD-III [GFR 30s]-STABLE; Dr.Lateef-  stable.   # DISPOSITION: # PT/INR every month x 6  # in 6 months- MD; cbc/bmp/Pt/INR-Dr.B  Cc;

## 2023-09-05 ENCOUNTER — Ambulatory Visit (INDEPENDENT_AMBULATORY_CARE_PROVIDER_SITE_OTHER): Payer: Medicare Other

## 2023-09-05 DIAGNOSIS — E538 Deficiency of other specified B group vitamins: Secondary | ICD-10-CM | POA: Diagnosis not present

## 2023-09-05 MED ORDER — CYANOCOBALAMIN 1000 MCG/ML IJ SOLN
1000.0000 ug | Freq: Once | INTRAMUSCULAR | Status: AC
Start: 2023-09-05 — End: 2023-09-05
  Administered 2023-09-05: 1000 ug via INTRAMUSCULAR

## 2023-10-01 ENCOUNTER — Ambulatory Visit (INDEPENDENT_AMBULATORY_CARE_PROVIDER_SITE_OTHER): Payer: Medicare Other | Admitting: Family Medicine

## 2023-10-01 DIAGNOSIS — E538 Deficiency of other specified B group vitamins: Secondary | ICD-10-CM | POA: Diagnosis not present

## 2023-10-01 DIAGNOSIS — Z23 Encounter for immunization: Secondary | ICD-10-CM | POA: Diagnosis not present

## 2023-10-01 MED ORDER — CYANOCOBALAMIN 1000 MCG/ML IJ SOLN
1000.0000 ug | Freq: Once | INTRAMUSCULAR | Status: AC
Start: 2023-10-01 — End: 2023-10-01
  Administered 2023-10-01: 1000 ug via INTRAMUSCULAR

## 2023-10-01 NOTE — Progress Notes (Unsigned)
After obtaining consent, and per orders of Dr. Laural Benes, injection of B12 and Flu High Dose given by Regis Bill. Patient instructed to remain in clinic for 20 minutes afterwards, and to report any adverse reaction to me immediately.

## 2023-10-03 ENCOUNTER — Inpatient Hospital Stay: Payer: Medicare Other | Attending: Internal Medicine

## 2023-10-03 DIAGNOSIS — C44310 Basal cell carcinoma of skin of unspecified parts of face: Secondary | ICD-10-CM | POA: Diagnosis present

## 2023-10-03 DIAGNOSIS — Z79899 Other long term (current) drug therapy: Secondary | ICD-10-CM | POA: Insufficient documentation

## 2023-10-03 DIAGNOSIS — I82512 Chronic embolism and thrombosis of left femoral vein: Secondary | ICD-10-CM | POA: Insufficient documentation

## 2023-10-03 LAB — PROTIME-INR
INR: 2.8 — ABNORMAL HIGH (ref 0.8–1.2)
Prothrombin Time: 30.1 s — ABNORMAL HIGH (ref 11.4–15.2)

## 2023-11-01 ENCOUNTER — Ambulatory Visit (INDEPENDENT_AMBULATORY_CARE_PROVIDER_SITE_OTHER): Payer: Medicare Other | Admitting: Family Medicine

## 2023-11-01 ENCOUNTER — Encounter: Payer: Self-pay | Admitting: Family Medicine

## 2023-11-01 VITALS — BP 121/65 | HR 74 | Ht 67.0 in | Wt 160.2 lb

## 2023-11-01 DIAGNOSIS — N184 Chronic kidney disease, stage 4 (severe): Secondary | ICD-10-CM | POA: Diagnosis not present

## 2023-11-01 DIAGNOSIS — I129 Hypertensive chronic kidney disease with stage 1 through stage 4 chronic kidney disease, or unspecified chronic kidney disease: Secondary | ICD-10-CM

## 2023-11-01 DIAGNOSIS — E538 Deficiency of other specified B group vitamins: Secondary | ICD-10-CM | POA: Diagnosis not present

## 2023-11-01 DIAGNOSIS — Z0000 Encounter for general adult medical examination without abnormal findings: Secondary | ICD-10-CM | POA: Diagnosis not present

## 2023-11-01 DIAGNOSIS — D692 Other nonthrombocytopenic purpura: Secondary | ICD-10-CM | POA: Diagnosis not present

## 2023-11-01 DIAGNOSIS — D6869 Other thrombophilia: Secondary | ICD-10-CM

## 2023-11-01 MED ORDER — DONEPEZIL HCL 10 MG PO TABS: 10.0000 mg | ORAL_TABLET | Freq: Every day | ORAL | 1 refills | Status: DC

## 2023-11-01 MED ORDER — AMLODIPINE BESYLATE 2.5 MG PO TABS: 2.5000 mg | ORAL_TABLET | Freq: Every day | ORAL | 1 refills | Status: DC

## 2023-11-01 MED ORDER — CYANOCOBALAMIN 1000 MCG/ML IJ SOLN
1000.0000 ug | Freq: Once | INTRAMUSCULAR | Status: AC
Start: 2023-11-01 — End: 2023-11-01
  Administered 2023-11-01: 1000 ug via INTRAMUSCULAR

## 2023-11-01 MED ORDER — MONTELUKAST SODIUM 10 MG PO TABS: 10.0000 mg | ORAL_TABLET | Freq: Every day | ORAL | 1 refills | Status: DC

## 2023-11-01 NOTE — Progress Notes (Signed)
BP 121/65   Pulse 74   Ht 5\' 7"  (1.702 m)   Wt 160 lb 3.2 oz (72.7 kg)   SpO2 95%   BMI 25.09 kg/m    Subjective:    Patient ID: Gregg Thomas, male    DOB: [birth year], 89 y.o.   MRN: [id number]  HPI: Gregg Thomas is a 89 y.o. male presenting on 11/01/2023 for comprehensive medical examination. Current medical complaints include:  HYPERTENSION  Hypertension status: controlled  Satisfied with current treatment? yes Duration of hypertension: chronic BP monitoring frequency:  not checking BP medication side effects:  no Medication compliance: excellent compliance Previous BP meds: amlodipine Aspirin: no Recurrent headaches: no Visual changes: no Palpitations: no Dyspnea: no Chest pain: no Lower extremity edema: no Dizzy/lightheaded: no   He currently lives with: wife Interim Problems from his last visit: no  Functional Status Survey: Is the patient deaf or have difficulty hearing?: Yes Does the patient have difficulty seeing, even when wearing glasses/contacts?: No Does the patient have difficulty concentrating, remembering, or making decisions?: Yes Does the patient have difficulty walking or climbing stairs?: No Does the patient have difficulty dressing or bathing?: No Does the patient have difficulty doing errands alone such as visiting a doctor's office or shopping?: Yes  FALL RISK:    08/15/2023    4:12 PM 08/01/2023    1:14 PM 04/29/2023    1:12 PM 02/25/2023    2:08 PM 12/27/2022   11:21 AM  Fall Risk   Falls in the past year? 1 1 1 1    Number falls in past yr: 1 1 1 1  0  Injury with Fall? 0 1 0 0 0  Risk for fall due to :   History of fall(s) History of fall(s) No Fall Risks  Follow up   Falls evaluation completed Falls evaluation completed Falls evaluation completed    Depression Screen    08/15/2023    4:12 PM 08/01/2023    1:15 PM 07/17/2023    3:54 PM 07/17/2023    3:44 PM 04/29/2023    1:12 PM  Depression screen PHQ 2/9   Decreased Interest 1 1 1 1 1   Down, Depressed, Hopeless 1 1 1 1 1   PHQ - 2 Score 2 2 2 2 2   Altered sleeping 1 1 2 2 1   Tired, decreased energy 2 2 2 2 1   Change in appetite 0 0 0 0 1  Feeling bad or failure about yourself  0 0 0 0 0  Trouble concentrating 1 1 2 2 1   Moving slowly or fidgety/restless 0 1 1 1 1   Suicidal thoughts 0 0 0 0 0  PHQ-9 Score 6 7 9 9 7   Difficult doing work/chores Somewhat difficult Somewhat difficult Somewhat difficult Somewhat difficult Somewhat difficult    Advanced Directives Does patient have a HCPOA?    yes If yes, name and contact information:  Does patient have a living will or MOST form?  yes  Past Medical History:  Past Medical History:  Diagnosis Date   Chronic kidney disease    Clotting disorder (HCC)    Dementia (HCC)    Heart murmur    Skin cancer     Surgical History:  Past Surgical History:  Procedure Laterality Date   biopsy Right 08/26/2019   temple biopsy - dr.dasher    EYE SURGERY Right    benign growth on right eye    KNEE SURGERY Right    MELANOMA EXCISION Bilateral  lower back     Medications:  Current Outpatient Medications on File Prior to Visit  Medication Sig   albuterol (VENTOLIN HFA) 108 (90 Base) MCG/ACT inhaler Inhale 2 puffs into the lungs every 6 (six) hours as needed for wheezing or shortness of breath.   ascorbic acid (VITAMIN C) 1000 MG tablet Take by mouth.   cetirizine (ZYRTEC) 5 MG tablet Take 1 tablet (5 mg total) by mouth daily.   Cholecalciferol (VITAMIN D3 PO) Take 1 tablet by mouth daily.   Cyanocobalamin (VITAMIN B-12 ER PO) Take 1,000 mcg by mouth daily at 6 (six) AM.   ELDERBERRY PO Take 1 tablet by mouth daily.   fluticasone (FLONASE) 50 MCG/ACT nasal spray Place 2 sprays into both nostrils daily.   gabapentin (NEURONTIN) 100 MG capsule Take 1 capsule (100 mg total) by mouth at bedtime.   Grape Seed Extract 50 MG CAPS Take 1,300 mg by mouth daily.    Ipratropium-Albuterol (COMBIVENT)  20-100 MCG/ACT AERS respimat Inhale 1 puff into the lungs every 6 (six) hours.   warfarin (COUMADIN) 5 MG tablet Take 1 tablet (5 mg total) by mouth one time only at 6 PM. (Patient taking differently: Take 5 mg by mouth daily.)   zinc gluconate 50 MG tablet Take by mouth.   No current facility-administered medications on file prior to visit.    Allergies:  No Known Allergies  Social History:  Social History   Socioeconomic History   Marital status: Married    Spouse name: Not on file   Number of children: Not on file   Years of education: 13 years    Highest education level: Some college, no degree  Occupational History   Occupation: retired  Tobacco Use   Smoking status: Never   Smokeless tobacco: Never  Vaping Use   Vaping status: Never Used  Substance and Sexual Activity   Alcohol use: No   Drug use: No   Sexual activity: Not Currently  Other Topics Concern   Not on file  Social History Narrative   [employer]    [place]       Golfing 2 times a week, alk 3 times a week    Social Determinants of Health   Financial Resource Strain: Low Risk  (09/24/2022)   Overall Financial Resource Strain (CARDIA)    Difficulty of Paying Living Expenses: Not hard at all  Food Insecurity: No Food Insecurity (08/08/2023)   Hunger Vital Sign    Worried About Running Out of Food in the Last Year: Never true    Ran Out of Food in the Last Year: Never true  Transportation Needs: No Transportation Needs (08/08/2023)   PRAPARE - Administrator, Civil Service (Medical): No    Lack of Transportation (Non-Medical): No  Physical Activity: Insufficiently Active (09/24/2022)   Exercise Vital Sign    Days of Exercise per Week: 2 days    Minutes of Exercise per Session: 20 min  Stress: No Stress Concern Present (09/24/2022)   [employer] of Occupational Health - Occupational Stress Questionnaire    Feeling of Stress : Not at all  Social Connections:  Moderately Integrated (09/24/2022)   Social Connection and Isolation Panel [NHANES]    Frequency of Communication with Friends and Family: Once a week    Frequency of Social Gatherings with Friends and Family: Once a week    Attends Religious Services: More than 4 times per year    Active Member of [employer]  or Organizations: No    Attends Banker Meetings: More than 4 times per year    Marital Status: Married  Catering manager Violence: Not At Risk (08/08/2023)   Humiliation, Afraid, Rape, and Kick questionnaire    Fear of Current or Ex-Partner: No    Emotionally Abused: No    Physically Abused: No    Sexually Abused: No   Social History   Tobacco Use  Smoking Status Never  Smokeless Tobacco Never   Social History   Substance and Sexual Activity  Alcohol Use No    Family History:  Family History  Problem Relation Age of Onset   Stroke Mother    Cerebral aneurysm Father     Past medical history, surgical history, medications, allergies, family history and social history reviewed with patient today and changes made to appropriate areas of the chart.   Review of Systems  Constitutional: Negative.   HENT:  Positive for hearing loss. Negative for congestion, ear discharge, ear pain, nosebleeds, sinus pain, sore throat and tinnitus.   Eyes: Negative.   Respiratory: Negative.  Negative for stridor.   Cardiovascular: Negative.   Gastrointestinal: Negative.   Genitourinary: Negative.   Musculoskeletal: Negative.   Skin: Negative.   Endo/Heme/Allergies:  Positive for environmental allergies. Negative for polydipsia. Bruises/bleeds easily.   All other ROS negative except what is listed above and in the HPI.      Objective:    BP 121/65   Pulse 74   Ht 5\' 7"  (1.702 m)   Wt 160 lb 3.2 oz (72.7 kg)   SpO2 95%   BMI 25.09 kg/m   Wt Readings from Last 3 Encounters:  11/01/23 160 lb 3.2 oz (72.7 kg)  09/04/23 156 lb 9.6 oz (71 kg)  08/15/23 155 lb (70.3 kg)     No results found.  Physical Exam Vitals and nursing note reviewed.  Constitutional:      General: He is not in acute distress.    Appearance: Normal appearance. He is not ill-appearing, toxic-appearing or diaphoretic.  HENT:     Head: Normocephalic and atraumatic.     Right Ear: External ear normal.     Left Ear: External ear normal.     Nose: Nose normal.     Mouth/Throat:     Mouth: Mucous membranes are moist.     Pharynx: Oropharynx is clear.  Eyes:     General: No scleral icterus.       Right eye: No discharge.        Left eye: No discharge.     Extraocular Movements: Extraocular movements intact.     Conjunctiva/sclera: Conjunctivae normal.     Pupils: Pupils are equal, round, and reactive to light.  Cardiovascular:     Rate and Rhythm: Normal rate and regular rhythm.     Pulses: Normal pulses.     Heart sounds: Normal heart sounds. No murmur heard.    No friction rub. No gallop.  Pulmonary:     Effort: Pulmonary effort is normal. No respiratory distress.     Breath sounds: Normal breath sounds. No stridor. No wheezing, rhonchi or rales.  Chest:     Chest wall: No tenderness.  Musculoskeletal:        General: Normal range of motion.     Cervical back: Normal range of motion and neck supple.  Skin:    General: Skin is warm and dry.     Capillary Refill: Capillary refill takes less than 2 seconds.  Coloration: Skin is not jaundiced or pale.     Findings: No bruising, erythema, lesion or rash.  Neurological:     General: No focal deficit present.     Mental Status: He is alert and oriented to person, place, and time. Mental status is at baseline.  Psychiatric:        Mood and Affect: Mood normal.        Behavior: Behavior normal.        Thought Content: Thought content normal.        Judgment: Judgment normal.        11/01/2023    1:50 PM 09/24/2022   11:08 AM 09/22/2021    1:49 PM 09/19/2020    1:53 PM 06/20/2020    3:26 PM  6CIT Screen  What Year? 0  points 0 points 0 points 0 points 0 points  What month? 0 points 0 points 0 points 0 points 0 points  What time? 0 points 0 points 3 points 3 points 0 points  Count back from 20 0 points 0 points 0 points 0 points 0 points  Months in reverse 2 points 0 points 0 points 2 points 0 points  Repeat phrase 10 points 0 points 4 points 0 points 2 points  Total Score 12 points 0 points 7 points 5 points 2 points    Results for orders placed or performed in visit on 11/01/23  Comprehensive metabolic panel  Result Value Ref Range   Glucose 95 70 - 99 mg/dL   BUN 18 10 - 36 mg/dL   Creatinine, Ser 8.46 (H) 0.76 - 1.27 mg/dL   eGFR 34 (L) >96 EX/BMW/4.13   BUN/Creatinine Ratio 10 10 - 24   Sodium 143 134 - 144 mmol/L   Potassium 4.4 3.5 - 5.2 mmol/L   Chloride 109 (H) 96 - 106 mmol/L   CO2 25 20 - 29 mmol/L   Calcium 9.6 8.6 - 10.2 mg/dL   Total Protein 6.5 6.0 - 8.5 g/dL   Albumin 3.7 3.6 - 4.6 g/dL   Globulin, Total 2.8 1.5 - 4.5 g/dL   Bilirubin Total 0.5 0.0 - 1.2 mg/dL   Alkaline Phosphatase 116 44 - 121 IU/L   AST 39 0 - 40 IU/L   ALT 31 0 - 44 IU/L  CBC with Differential/Platelet  Result Value Ref Range   WBC 4.0 3.4 - 10.8 x10E3/uL   RBC 4.15 4.14 - 5.80 x10E6/uL   Hemoglobin 13.5 13.0 - 17.7 g/dL   Hematocrit 24.4 01.0 - 51.0 %   MCV 98 (H) 79 - 97 fL   MCH 32.5 26.6 - 33.0 pg   MCHC 33.1 31.5 - 35.7 g/dL   RDW 27.2 53.6 - 64.4 %   Platelets 203 150 - 450 x10E3/uL   Neutrophils 49 Not Estab. %   Lymphs 31 Not Estab. %   Monocytes 14 Not Estab. %   Eos 5 Not Estab. %   Basos 1 Not Estab. %   Neutrophils Absolute 2.0 1.4 - 7.0 x10E3/uL   Lymphocytes Absolute 1.3 0.7 - 3.1 x10E3/uL   Monocytes Absolute 0.6 0.1 - 0.9 x10E3/uL   EOS (ABSOLUTE) 0.2 0.0 - 0.4 x10E3/uL   Basophils Absolute 0.0 0.0 - 0.2 x10E3/uL   Immature Granulocytes 0 Not Estab. %   Immature Grans (Abs) 0.0 0.0 - 0.1 x10E3/uL  B12  Result Value Ref Range   Vitamin B-12 >2000 (H) 232 - 1245 pg/mL       Assessment & Plan:  Problem List Items Addressed This Visit       Cardiovascular and Mediastinum   Senile purpura (HCC)    Reassured patient. Continue to monitor. Call with any concerns.       Relevant Medications   amLODipine (NORVASC) 2.5 MG tablet     Genitourinary   CKD (chronic kidney disease) stage 4, GFR 15-29 ml/min (HCC)    Rechecking labs today. Await results. Treat as needed.       Benign hypertensive renal disease    Under good control on current regimen. Continue current regimen. Continue to monitor. Call with any concerns. Refills given. Labs drawn today.        Relevant Orders   Comprehensive metabolic panel (Completed)   CBC with Differential/Platelet (Completed)     Hematopoietic and Hemostatic   Acquired thrombophilia (HCC)    Stable. Continue warfarin. Call with any concerns.         Other   B12 deficiency    B12 shot given today. Continue to monitor. Call with any concerns.       Relevant Orders   B12 (Completed)   Other Visit Diagnoses     Encounter for Medicare annual wellness exam    -  Primary   Preventative care discussed today as below.        Preventative Services:  Health Risk Assessment and Personalized Prevention Plan: Done today Bone Mass Measurements: N/A CVD Screening: Done today Colon Cancer Screening: N/A Depression Screening: Done today Diabetes Screening: Done today Glaucoma Screening: See your eye doctor Hepatitis B vaccine: N/A Hepatitis C screening: up to date HIV Screening: up to date Flu Vaccine: up to date Lung cancer Screening: N/A Obesity Screening: done today Pneumonia Vaccines (2): up to date STI Screening: N/A PSA screening: N/A  Discussed aspirin prophylaxis for myocardial infarction prevention and decision was it was not indicated  LABORATORY TESTING:  Health maintenance labs ordered today as discussed above.   IMMUNIZATIONS:   - Tdap: Tetanus vaccination status reviewed: last tetanus booster  within 10 years. - Influenza: Up to date - Pneumovax: Up to date - Prevnar: Up to date - Zostavax vaccine: Refused  PATIENT COUNSELING:    Sexuality: Discussed sexually transmitted diseases, partner selection, use of condoms, avoidance of unintended pregnancy  and contraceptive alternatives.   Advised to avoid cigarette smoking.  I discussed with the patient that most people either abstain from alcohol or drink within safe limits (<=14/week and <=4 drinks/occasion for males, <=7/weeks and <= 3 drinks/occasion for females) and that the risk for alcohol disorders and other health effects rises proportionally with the number of drinks per week and how often a drinker exceeds daily limits.  Discussed cessation/primary prevention of drug use and availability of treatment for abuse.   Diet: Encouraged to adjust caloric intake to maintain  or achieve ideal body weight, to reduce intake of dietary saturated fat and total fat, to limit sodium intake by avoiding high sodium foods and not adding table salt, and to maintain adequate dietary potassium and calcium preferably from fresh fruits, vegetables, and low-fat dairy products.    stressed the importance of regular exercise  Injury prevention: Discussed safety belts, safety helmets, smoke detector, smoking near bedding or upholstery.   Dental health: Discussed importance of regular tooth brushing, flossing, and dental visits.   Follow up plan: NEXT PREVENTATIVE PHYSICAL DUE IN 1 YEAR. Return in about 3 months (around 02/01/2024).

## 2023-11-02 LAB — VITAMIN B12: Vitamin B-12: >2000 pg/mL — ABNORMAL HIGH (ref 232–1245)

## 2023-11-02 LAB — COMPREHENSIVE METABOLIC PANEL
ALT: 31 [IU]/L (ref 0–44)
AST: 39 [IU]/L (ref 0–40)
Albumin: 3.7 g/dL (ref 3.6–4.6)
Alkaline Phosphatase: 116 [IU]/L (ref 44–121)
BUN/Creatinine Ratio: 10 (ref 10–24)
BUN: 18 mg/dL (ref 10–36)
Bilirubin Total: 0.5 mg/dL (ref 0.0–1.2)
CO2: 25 mmol/L (ref 20–29)
Calcium: 9.6 mg/dL (ref 8.6–10.2)
Chloride: 109 mmol/L — ABNORMAL HIGH (ref 96–106)
Creatinine, Ser: 1.87 mg/dL — ABNORMAL HIGH (ref 0.76–1.27)
Globulin, Total: 2.8 g/dL (ref 1.5–4.5)
Glucose: 95 mg/dL (ref 70–99)
Potassium: 4.4 mmol/L (ref 3.5–5.2)
Sodium: 143 mmol/L (ref 134–144)
Total Protein: 6.5 g/dL (ref 6.0–8.5)
eGFR: 34 mL/min/{1.73_m2} — ABNORMAL LOW (ref >59)

## 2023-11-02 LAB — CBC WITH DIFFERENTIAL/PLATELET
Basophils Absolute: 0 10*3/uL (ref 0.0–0.2)
Basos: 1 %
EOS (ABSOLUTE): 0.2 10*3/uL (ref 0.0–0.4)
Eos: 5 %
Hematocrit: 40.8 % (ref 37.5–51.0)
Hemoglobin: 13.5 g/dL (ref 13.0–17.7)
Immature Grans (Abs): 0 10*3/uL (ref 0.0–0.1)
Immature Granulocytes: 0 %
Lymphocytes Absolute: 1.3 10*3/uL (ref 0.7–3.1)
Lymphs: 31 %
MCH: 32.5 pg (ref 26.6–33.0)
MCHC: 33.1 g/dL (ref 31.5–35.7)
MCV: 98 fL — ABNORMAL HIGH (ref 79–97)
Monocytes Absolute: 0.6 10*3/uL (ref 0.1–0.9)
Monocytes: 14 %
Neutrophils Absolute: 2 10*3/uL (ref 1.4–7.0)
Neutrophils: 49 %
Platelets: 203 10*3/uL (ref 150–450)
RBC: 4.15 x10E6/uL (ref 4.14–5.80)
RDW: 13.3 % (ref 11.6–15.4)
WBC: 4 10*3/uL (ref 3.4–10.8)

## 2023-11-05 ENCOUNTER — Inpatient Hospital Stay: Payer: Medicare Other | Attending: Internal Medicine

## 2023-11-05 DIAGNOSIS — Z79899 Other long term (current) drug therapy: Secondary | ICD-10-CM | POA: Diagnosis not present

## 2023-11-05 DIAGNOSIS — I82512 Chronic embolism and thrombosis of left femoral vein: Secondary | ICD-10-CM | POA: Diagnosis present

## 2023-11-05 DIAGNOSIS — C44310 Basal cell carcinoma of skin of unspecified parts of face: Secondary | ICD-10-CM | POA: Insufficient documentation

## 2023-11-05 LAB — PROTIME-INR
INR: 2.7 — ABNORMAL HIGH (ref 0.8–1.2)
Prothrombin Time: 28.6 s — ABNORMAL HIGH (ref 11.4–15.2)

## 2023-11-08 ENCOUNTER — Encounter: Payer: Self-pay | Admitting: Family Medicine

## 2023-11-10 NOTE — Assessment & Plan Note (Signed)
Under good control on current regimen. Continue current regimen. Continue to monitor. Call with any concerns. Refills given. Labs drawn today.   

## 2023-11-10 NOTE — Assessment & Plan Note (Signed)
Rechecking labs today. Await results. Treat as needed.  °

## 2023-11-10 NOTE — Assessment & Plan Note (Signed)
Reassured patient. Continue to monitor. Call with any concerns.  

## 2023-11-10 NOTE — Assessment & Plan Note (Signed)
B12 shot given today. Continue to monitor. Call with any concerns.

## 2023-11-10 NOTE — Patient Instructions (Signed)
Preventative Services:  Health Risk Assessment and Personalized Prevention Plan: Done today Bone Mass Measurements: N/A CVD Screening: Done today Colon Cancer Screening: N/A Depression Screening: Done today Diabetes Screening: Done today Glaucoma Screening: See your eye doctor Hepatitis B vaccine: N/A Hepatitis C screening: up to date HIV Screening: up to date Flu Vaccine: up to date Lung cancer Screening: N/A Obesity Screening: done today Pneumonia Vaccines (2): up to date STI Screening: N/A PSA screening: N/A

## 2023-11-10 NOTE — Assessment & Plan Note (Signed)
Stable. Continue warfarin. Call with any concerns.

## 2023-12-02 ENCOUNTER — Ambulatory Visit: Payer: Medicare Other

## 2023-12-02 DIAGNOSIS — E538 Deficiency of other specified B group vitamins: Secondary | ICD-10-CM

## 2023-12-02 MED ORDER — CYANOCOBALAMIN 1000 MCG/ML IJ SOLN
1000.0000 ug | Freq: Once | INTRAMUSCULAR | Status: AC
Start: 2023-12-02 — End: 2023-12-02
  Administered 2023-12-02: 1000 ug via INTRAMUSCULAR

## 2023-12-02 NOTE — Progress Notes (Signed)
 Patient presented to clinic for a nurse visit for B 12 injection. 1,000 mcg of cyanocobalamin administered to right deltoid, per patient request. Patient tolerated well, no complications encountered.

## 2023-12-03 ENCOUNTER — Inpatient Hospital Stay: Payer: Medicare Other | Attending: Internal Medicine

## 2023-12-03 DIAGNOSIS — I82512 Chronic embolism and thrombosis of left femoral vein: Secondary | ICD-10-CM | POA: Insufficient documentation

## 2023-12-03 DIAGNOSIS — C44310 Basal cell carcinoma of skin of unspecified parts of face: Secondary | ICD-10-CM | POA: Diagnosis present

## 2023-12-03 DIAGNOSIS — Z79899 Other long term (current) drug therapy: Secondary | ICD-10-CM | POA: Diagnosis not present

## 2023-12-03 LAB — CBC WITH DIFFERENTIAL (CANCER CENTER ONLY)
Abs Immature Granulocytes: 0.01 10*3/uL (ref 0.00–0.07)
Basophils Absolute: 0 10*3/uL (ref 0.0–0.1)
Basophils Relative: 1 %
Eosinophils Absolute: 0.2 10*3/uL (ref 0.0–0.5)
Eosinophils Relative: 5 %
HCT: 43 % (ref 39.0–52.0)
Hemoglobin: 14 g/dL (ref 13.0–17.0)
Immature Granulocytes: 0 %
Lymphocytes Relative: 35 %
Lymphs Abs: 1.4 10*3/uL (ref 0.7–4.0)
MCH: 32.5 pg (ref 26.0–34.0)
MCHC: 32.6 g/dL (ref 30.0–36.0)
MCV: 99.8 fL (ref 80.0–100.0)
Monocytes Absolute: 0.5 10*3/uL (ref 0.1–1.0)
Monocytes Relative: 12 %
Neutro Abs: 2 10*3/uL (ref 1.7–7.7)
Neutrophils Relative %: 47 %
Platelet Count: 174 10*3/uL (ref 150–400)
RBC: 4.31 MIL/uL (ref 4.22–5.81)
RDW: 14 % (ref 11.5–15.5)
WBC Count: 4.1 10*3/uL (ref 4.0–10.5)
nRBC: 0 % (ref 0.0–0.2)

## 2023-12-03 LAB — BASIC METABOLIC PANEL - CANCER CENTER ONLY
Anion gap: 9 (ref 5–15)
BUN: 18 mg/dL (ref 8–23)
CO2: 25 mmol/L (ref 22–32)
Calcium: 9.1 mg/dL (ref 8.9–10.3)
Chloride: 106 mmol/L (ref 98–111)
Creatinine: 1.9 mg/dL — ABNORMAL HIGH (ref 0.61–1.24)
GFR, Estimated: 33 mL/min — ABNORMAL LOW (ref >60)
Glucose, Bld: 99 mg/dL (ref 70–99)
Potassium: 4.2 mmol/L (ref 3.5–5.1)
Sodium: 140 mmol/L (ref 135–145)

## 2023-12-03 LAB — PROTIME-INR
INR: 2.3 — ABNORMAL HIGH (ref 0.8–1.2)
Prothrombin Time: 25.8 s — ABNORMAL HIGH (ref 11.4–15.2)

## 2024-01-02 ENCOUNTER — Inpatient Hospital Stay: Payer: Medicare Other | Attending: Internal Medicine

## 2024-01-02 DIAGNOSIS — I82512 Chronic embolism and thrombosis of left femoral vein: Secondary | ICD-10-CM | POA: Diagnosis present

## 2024-01-02 DIAGNOSIS — C44310 Basal cell carcinoma of skin of unspecified parts of face: Secondary | ICD-10-CM | POA: Insufficient documentation

## 2024-01-02 LAB — PROTIME-INR
INR: 2.5 — ABNORMAL HIGH (ref 0.8–1.2)
Prothrombin Time: 27.3 s — ABNORMAL HIGH (ref 11.4–15.2)

## 2024-01-05 ENCOUNTER — Other Ambulatory Visit: Payer: Self-pay | Admitting: Internal Medicine

## 2024-01-06 NOTE — Telephone Encounter (Signed)
  Component Ref Range & Units (hover) 4 d ago (01/02/24) 1 mo ago (12/03/23) 2 mo ago (11/05/23) 3 mo ago (10/03/23) 4 mo ago (09/04/23) 4 mo ago (08/11/23) 4 mo ago (08/10/23)  Prothrombin Time 27.3 High  25.8 High  28.6 High  30.1 High  29.7 High  30.9 High  27.9 High   INR 2.5 High  2.3 High  CM 2.7 High  CM 2.8 High  CM 2.8 High  CM 2.9 High  CM 2.6 High  CM

## 2024-01-09 ENCOUNTER — Other Ambulatory Visit: Payer: Self-pay | Admitting: Family Medicine

## 2024-01-13 NOTE — Telephone Encounter (Signed)
 Rx was sent to pharmacy 04/29/23 #90/3 RF  Requested Prescriptions  Pending Prescriptions Disp Refills   cetirizine  (ZYRTEC ) 5 MG tablet [Pharmacy Med Name: CETIRIZINE  HCL 5 MG TABLET] 90 tablet 0    Sig: Take 1 tablet (5 mg total) by mouth daily.     Ear, Nose, and Throat:  Antihistamines 2 Failed - 01/13/2024 10:41 AM      Failed - Cr in normal range and within 360 days    Creatinine  Date Value Ref Range Status  12/03/2023 1.90 (H) 0.61 - 1.24 mg/dL Final  94/95/7984 8.28 (H) 0.60 - 1.30 mg/dL Final         Passed - Valid encounter within last 12 months    Recent Outpatient Visits           2 months ago Encounter for [employer] annual wellness exam   [place] [facility] [place], Megan P, DO   3 months ago Flu vaccine need   [place] [facility] [place], Megan P, DO   5 months ago Pneumonia due to COVID-19 virus   [place] [facility] [place], [place], DO   5 months ago Muscular deconditioning   [place] [facility] [place], [place], DO   6 months ago Dizziness   [place] [facility] [place], Melanie DASEN, NP       Future Appointments             In 3 weeks Vicci, Duwaine SQUIBB, DO [place] [facility]

## 2024-02-03 ENCOUNTER — Telehealth: Payer: Self-pay

## 2024-02-03 ENCOUNTER — Ambulatory Visit: Payer: Medicare Other | Admitting: Family Medicine

## 2024-02-03 ENCOUNTER — Encounter: Payer: Self-pay | Admitting: Family Medicine

## 2024-02-03 VITALS — BP 113/63 | HR 58 | Ht 67.0 in | Wt 165.4 lb

## 2024-02-03 DIAGNOSIS — E538 Deficiency of other specified B group vitamins: Secondary | ICD-10-CM | POA: Diagnosis not present

## 2024-02-03 DIAGNOSIS — R2689 Other abnormalities of gait and mobility: Secondary | ICD-10-CM

## 2024-02-03 DIAGNOSIS — I82512 Chronic embolism and thrombosis of left femoral vein: Secondary | ICD-10-CM

## 2024-02-03 DIAGNOSIS — R42 Dizziness and giddiness: Secondary | ICD-10-CM | POA: Diagnosis not present

## 2024-02-03 DIAGNOSIS — U099 Post covid-19 condition, unspecified: Secondary | ICD-10-CM

## 2024-02-03 DIAGNOSIS — R29898 Other symptoms and signs involving the musculoskeletal system: Secondary | ICD-10-CM

## 2024-02-03 DIAGNOSIS — I129 Hypertensive chronic kidney disease with stage 1 through stage 4 chronic kidney disease, or unspecified chronic kidney disease: Secondary | ICD-10-CM | POA: Diagnosis not present

## 2024-02-03 DIAGNOSIS — G9332 Myalgic encephalomyelitis/chronic fatigue syndrome: Secondary | ICD-10-CM

## 2024-02-03 MED ORDER — CYANOCOBALAMIN 1000 MCG/ML IJ SOLN
1000.0000 ug | Freq: Once | INTRAMUSCULAR | Status: AC
  Administered 2024-02-03: 1000 ug via INTRAMUSCULAR

## 2024-02-03 NOTE — Progress Notes (Addendum)
BP 113/63   Pulse (!) 58   Ht 5\' 7"  (1.702 m)   Wt 165 lb 6.4 oz (75 kg)   SpO2 93%   BMI 25.91 kg/m    Subjective:    Patient ID: Gregg Thomas, male    DOB: 26-Aug-1931, 89 y.o.   MRN: [id number]  HPI: Gregg Thomas is a 89 y.o. male  Chief Complaint  Patient presents with   Hypertension   HYPERTENSION  Hypertension status: controlled  Satisfied with current treatment? yes Duration of hypertension: chronic BP monitoring frequency:  not checking BP medication side effects:  no Medication compliance: excellent compliance Previous BP meds: amlodipine Aspirin: no Recurrent headaches: no Visual changes: no Palpitations: no Dyspnea: no Chest pain: no Lower extremity edema: no Dizzy/lightheaded: no  He would like to start PT at home again. He has known DVT which causes heaviness in his leg. He notes that his balance has been off a bit and it was very helpful when he had PT. No other concerns or complaints at this time.   Relevant past medical, surgical, family and social history reviewed and updated as indicated. Interim medical history since our last visit reviewed. Allergies and medications reviewed and updated.  Review of Systems  Constitutional: Negative.   Respiratory: Negative.    Cardiovascular: Negative.   Gastrointestinal: Negative.   Musculoskeletal: Negative.   Psychiatric/Behavioral: Negative.      Per HPI unless specifically indicated above     Objective:    BP 113/63   Pulse (!) 58   Ht 5\' 7"  (1.702 m)   Wt 165 lb 6.4 oz (75 kg)   SpO2 93%   BMI 25.91 kg/m   Wt Readings from Last 3 Encounters:  02/03/24 165 lb 6.4 oz (75 kg)  11/01/23 160 lb 3.2 oz (72.7 kg)  09/04/23 156 lb 9.6 oz (71 kg)    Physical Exam Vitals and nursing note reviewed.  Constitutional:      General: He is not in acute distress.    Appearance: Normal appearance. He is not ill-appearing, toxic-appearing or diaphoretic.  HENT:     Head:  Normocephalic and atraumatic.     Right Ear: External ear normal.     Left Ear: External ear normal.     Nose: Nose normal.     Mouth/Throat:     Mouth: Mucous membranes are moist.     Pharynx: Oropharynx is clear.  Eyes:     General: No scleral icterus.       Right eye: No discharge.        Left eye: No discharge.     Extraocular Movements: Extraocular movements intact.     Conjunctiva/sclera: Conjunctivae normal.     Pupils: Pupils are equal, round, and reactive to light.  Cardiovascular:     Rate and Rhythm: Normal rate and regular rhythm.     Pulses: Normal pulses.     Heart sounds: Normal heart sounds. No murmur heard.    No friction rub. No gallop.  Pulmonary:     Effort: Pulmonary effort is normal. No respiratory distress.     Breath sounds: Normal breath sounds. No stridor. No wheezing, rhonchi or rales.  Chest:     Chest wall: No tenderness.  Musculoskeletal:        General: Normal range of motion.     Cervical back: Normal range of motion and neck supple.  Skin:    General: Skin is warm and dry.  Capillary Refill: Capillary refill takes less than 2 seconds.     Coloration: Skin is not jaundiced or pale.     Findings: No bruising, erythema, lesion or rash.  Neurological:     General: No focal deficit present.     Mental Status: He is alert and oriented to person, place, and time. Mental status is at baseline.  Psychiatric:        Mood and Affect: Mood normal.        Behavior: Behavior normal.        Thought Content: Thought content normal.        Judgment: Judgment normal.     Results for orders placed or performed in visit on 01/02/24  Protime-INR   Collection Time: 01/02/24  1:22 PM  Result Value Ref Range   Prothrombin Time 27.3 (H) 11.4 - 15.2 seconds   INR 2.5 (H) 0.8 - 1.2      Assessment & Plan:   Problem List Items Addressed This Visit       Cardiovascular and Mediastinum   Chronic deep vein thrombosis (DVT) of femoral vein of left lower  extremity (HCC)   Chronic. Could use PT- ordered today.       Relevant Orders   Ambulatory referral to Home Health     Musculoskeletal and Integument   Muscular deconditioning   Will get him home health PT out. Call with any concerns. Continue to monitor.       Relevant Orders   Ambulatory referral to Home Health     Genitourinary   Benign hypertensive renal disease   Under good control on current regimen. Continue current regimen. Continue to monitor. Call with any concerns. Refills up to date.          Other   Post-COVID chronic fatigue   Would benefit from PT. Ordered today. Follow up as able.      Relevant Orders   Ambulatory referral to Home Health   B12 deficiency - Primary   B12 shot given today.       Relevant Orders   Ambulatory referral to Home Health   Balance problem   Will get him home health PT out. Call with any concerns. Continue to monitor.       Relevant Orders   Ambulatory referral to Home Health   Dizziness   Will get him home health PT out. Call with any concerns. Continue to monitor.       Relevant Orders   Ambulatory referral to Home Health     Follow up plan: Return in about 3 months (around 05/02/2024) for physical.

## 2024-02-03 NOTE — Telephone Encounter (Signed)
-----   Message from Olevia Perches sent at 02/03/2024  1:41 PM EST ----- Handicap placquard please

## 2024-02-04 ENCOUNTER — Inpatient Hospital Stay: Payer: Medicare Other | Attending: Internal Medicine

## 2024-02-04 DIAGNOSIS — C44310 Basal cell carcinoma of skin of unspecified parts of face: Secondary | ICD-10-CM | POA: Insufficient documentation

## 2024-02-04 DIAGNOSIS — I82512 Chronic embolism and thrombosis of left femoral vein: Secondary | ICD-10-CM | POA: Insufficient documentation

## 2024-02-04 LAB — PROTIME-INR
INR: 2.4 — ABNORMAL HIGH (ref 0.8–1.2)
Prothrombin Time: 26.3 s — ABNORMAL HIGH (ref 11.4–15.2)

## 2024-02-08 ENCOUNTER — Encounter: Payer: Self-pay | Admitting: Family Medicine

## 2024-02-08 MED ORDER — GABAPENTIN 100 MG PO CAPS: 200.0000 mg | ORAL_CAPSULE | Freq: Every day | ORAL | 1 refills | Status: DC

## 2024-02-08 NOTE — Assessment & Plan Note (Signed)
 Will get him home health PT out. Call with any concerns. Continue to monitor.

## 2024-02-08 NOTE — Assessment & Plan Note (Signed)
 B12 shot given today

## 2024-02-08 NOTE — Assessment & Plan Note (Signed)
Under good control on current regimen. Continue current regimen. Continue to monitor. Call with any concerns. Refills up to date.   

## 2024-02-11 NOTE — Assessment & Plan Note (Signed)
Would benefit from PT. Ordered today. Follow up as able.

## 2024-02-12 NOTE — Assessment & Plan Note (Signed)
Chronic. Could use PT- ordered today.

## 2024-02-14 ENCOUNTER — Telehealth: Payer: Self-pay | Admitting: Family Medicine

## 2024-02-14 NOTE — Telephone Encounter (Signed)
Pt's wife, called in states received call that more info is needed to be sent to  [facility] home health  as to why pt needs more PT.

## 2024-03-02 ENCOUNTER — Ambulatory Visit: Payer: Medicare Other

## 2024-03-03 ENCOUNTER — Encounter: Payer: Self-pay | Admitting: Internal Medicine

## 2024-03-03 ENCOUNTER — Other Ambulatory Visit: Payer: Self-pay | Admitting: *Deleted

## 2024-03-03 ENCOUNTER — Inpatient Hospital Stay (HOSPITAL_BASED_OUTPATIENT_CLINIC_OR_DEPARTMENT_OTHER): Payer: Medicare Other | Admitting: Internal Medicine

## 2024-03-03 ENCOUNTER — Inpatient Hospital Stay: Payer: Medicare Other | Attending: Internal Medicine

## 2024-03-03 VITALS — BP 150/74 | HR 57 | Temp 98.0°F | Ht 67.0 in

## 2024-03-03 DIAGNOSIS — Z823 Family history of stroke: Secondary | ICD-10-CM | POA: Diagnosis not present

## 2024-03-03 DIAGNOSIS — Z7901 Long term (current) use of anticoagulants: Secondary | ICD-10-CM | POA: Insufficient documentation

## 2024-03-03 DIAGNOSIS — I82512 Chronic embolism and thrombosis of left femoral vein: Secondary | ICD-10-CM | POA: Insufficient documentation

## 2024-03-03 DIAGNOSIS — Z85820 Personal history of malignant melanoma of skin: Secondary | ICD-10-CM | POA: Insufficient documentation

## 2024-03-03 DIAGNOSIS — Z8249 Family history of ischemic heart disease and other diseases of the circulatory system: Secondary | ICD-10-CM | POA: Diagnosis not present

## 2024-03-03 DIAGNOSIS — Z86711 Personal history of pulmonary embolism: Secondary | ICD-10-CM | POA: Diagnosis not present

## 2024-03-03 DIAGNOSIS — C44310 Basal cell carcinoma of skin of unspecified parts of face: Secondary | ICD-10-CM | POA: Insufficient documentation

## 2024-03-03 DIAGNOSIS — N1832 Chronic kidney disease, stage 3b: Secondary | ICD-10-CM | POA: Diagnosis not present

## 2024-03-03 DIAGNOSIS — Z79899 Other long term (current) drug therapy: Secondary | ICD-10-CM | POA: Insufficient documentation

## 2024-03-03 LAB — CBC WITH DIFFERENTIAL (CANCER CENTER ONLY)
Abs Immature Granulocytes: 0.02 10*3/uL (ref 0.00–0.07)
Basophils Absolute: 0 10*3/uL (ref 0.0–0.1)
Basophils Relative: 1 %
Eosinophils Absolute: 0.2 10*3/uL (ref 0.0–0.5)
Eosinophils Relative: 5 %
HCT: 41.1 % (ref 39.0–52.0)
Hemoglobin: 13.7 g/dL (ref 13.0–17.0)
Immature Granulocytes: 0 %
Lymphocytes Relative: 31 %
Lymphs Abs: 1.5 10*3/uL (ref 0.7–4.0)
MCH: 33.1 pg (ref 26.0–34.0)
MCHC: 33.3 g/dL (ref 30.0–36.0)
MCV: 99.3 fL (ref 80.0–100.0)
Monocytes Absolute: 0.6 10*3/uL (ref 0.1–1.0)
Monocytes Relative: 12 %
Neutro Abs: 2.5 10*3/uL (ref 1.7–7.7)
Neutrophils Relative %: 51 %
Platelet Count: 172 10*3/uL (ref 150–400)
RBC: 4.14 MIL/uL — ABNORMAL LOW (ref 4.22–5.81)
RDW: 13.5 % (ref 11.5–15.5)
WBC Count: 4.9 10*3/uL (ref 4.0–10.5)
nRBC: 0 % (ref 0.0–0.2)

## 2024-03-03 LAB — PROTIME-INR
INR: 2.4 — ABNORMAL HIGH (ref 0.8–1.2)
Prothrombin Time: 26 s — ABNORMAL HIGH (ref 11.4–15.2)

## 2024-03-03 LAB — BASIC METABOLIC PANEL - CANCER CENTER ONLY
Anion gap: 7 (ref 5–15)
BUN: 21 mg/dL (ref 8–23)
CO2: 26 mmol/L (ref 22–32)
Calcium: 9 mg/dL (ref 8.9–10.3)
Chloride: 108 mmol/L (ref 98–111)
Creatinine: 1.97 mg/dL — ABNORMAL HIGH (ref 0.61–1.24)
GFR, Estimated: 32 mL/min — ABNORMAL LOW (ref >60)
Glucose, Bld: 88 mg/dL (ref 70–99)
Potassium: 4.5 mmol/L (ref 3.5–5.1)
Sodium: 141 mmol/L (ref 135–145)

## 2024-03-03 NOTE — Progress Notes (Signed)
 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  Accompanied by his wife.  Gregg Thomas 89 y.o.  male history of chronic left lower extremity DVT on Coumadin and history of CKD-III-IV; on erivedge intermittently for [facility] is here for follow-up.   No recent falls.  Currently doing physical therapy twice a week at home for balance.   Patient Currently on  Coumadin dose is 5 mg QD.  Denies any weight loss.  Denies any nausea vomiting.  No diarrhea.  Denies any blood in stools or black or stools.  No nausea no vomiting.  Review of Systems  Constitutional:  Positive for malaise/fatigue. Negative for chills, diaphoresis, fever and weight loss.  HENT:  Negative for nosebleeds and sore throat.   Eyes:  Negative for double vision.  Respiratory:  Negative for cough, hemoptysis, sputum production, shortness of breath and wheezing.   Cardiovascular:  Negative for chest pain, palpitations, orthopnea and leg swelling.  Gastrointestinal:  Negative for abdominal pain, blood in stool, constipation, diarrhea, heartburn, melena, nausea and vomiting.  Genitourinary:  Negative for dysuria, frequency and urgency.  Musculoskeletal:   Positive for joint pain and myalgias. Negative for back pain.  Skin: Negative.  Negative for itching and rash.  Neurological:  Negative for dizziness, tingling, focal weakness, weakness and headaches.  Endo/Heme/Allergies:  Bruises/bleeds easily.  Psychiatric/Behavioral:  Positive for memory loss. Negative for depression. The patient is not nervous/anxious and does not have insomnia.      MEDICAL HISTORY:  Past Medical History:  Diagnosis Date   Chronic kidney disease    Clotting disorder (HCC)    Dementia (HCC)    Heart murmur    Skin cancer     SURGICAL HISTORY: Past Surgical History:  Procedure Laterality Date   biopsy Right 08/26/2019   temple biopsy - dr.dasher    EYE SURGERY Right    benign growth on right eye    KNEE SURGERY Right    MELANOMA EXCISION Bilateral    lower back     SOCIAL HISTORY: Social History   Socioeconomic History   Marital status: Married    Spouse name: Not on file   Number of children: Not on file   Years of education: 13 years    Highest education level: Some college, no degree  Occupational History   Occupation: retired  Tobacco Use   Smoking status: Never   Smokeless tobacco: Never  Vaping Use   Vaping status: Never Used  Substance and Sexual Activity   Alcohol use: No   Drug use: No   Sexual activity: Not Currently  Other Topics Concern   Not on file  Social History Narrative   [employer]    [place]       Golfing 2 times a week, alk 3 times a week    Social Drivers  of Health   Financial Resource Strain: Low Risk  (09/24/2022)   Overall Financial Resource Strain (CARDIA)    Difficulty of Paying Living Expenses: Not hard at all  Food Insecurity: No Food Insecurity (08/08/2023)   Hunger Vital Sign    Worried About Running Out of Food in the Last Year: Never true    Ran Out of Food in the Last Year: Never true  Transportation Needs: No Transportation Needs (08/08/2023)   PRAPARE - Therapist, art (Medical): No    Lack of Transportation (Non-Medical): No  Physical Activity: Insufficiently Active (09/24/2022)   Exercise Vital Sign    Days of Exercise per Week: 2 days    Minutes of Exercise per Session: 20 min  Stress: No Stress Concern Present (09/24/2022)   [employer] of Occupational Health - Occupational Stress Questionnaire    Feeling of Stress : Not at all  Social Connections: Moderately Integrated (09/24/2022)   Social Connection and Isolation Panel [NHANES]    Frequency of Communication with Friends and Family: Once a week    Frequency of Social Gatherings with Friends and Family: Once a week    Attends Religious Services: More than 4 times per year    Active Member of [employer] or Organizations: No    Attends Engineer, structural: More than 4 times per year    Marital Status: Married  Catering manager Violence: Not At Risk (08/08/2023)   Humiliation, Afraid, Rape, and Kick questionnaire    Fear of Current or Ex-Partner: No    Emotionally Abused: No    Physically Abused: No    Sexually Abused: No    FAMILY HISTORY: Family History  Problem Relation Age of Onset   Stroke Mother    Cerebral aneurysm Father     ALLERGIES:  has no known allergies.  MEDICATIONS:  Current Outpatient Medications  Medication Sig Dispense Refill   albuterol (VENTOLIN HFA) 108 (90 Base) MCG/ACT inhaler Inhale 2 puffs into the lungs every 6 (six) hours as needed for wheezing or shortness of breath. 8 g 1   amLODipine (NORVASC) 2.5 MG tablet Take 1 tablet (2.5 mg total) by mouth daily. 90 tablet 1   ascorbic acid (VITAMIN C) 1000 MG tablet Take by mouth.     cetirizine (ZYRTEC) 5 MG tablet Take 1 tablet (5 mg total) by mouth daily. 90 tablet 3   Cholecalciferol (VITAMIN D3 PO) Take 1 tablet by mouth daily.     Cyanocobalamin (VITAMIN B-12 ER PO) Take 1,000 mcg by mouth daily at 6 (six) AM.     donepezil (ARICEPT) 10 MG tablet Take 1 tablet (10 mg total) by  mouth at bedtime. 90 tablet 1   ELDERBERRY PO Take 1 tablet by mouth daily.     fluticasone (FLONASE) 50 MCG/ACT nasal spray Place 2 sprays into both nostrils daily. 16 g 6   gabapentin (NEURONTIN) 100 MG capsule Take 2 capsules (200 mg total) by mouth at bedtime. 180 capsule 1   Grape Seed Extract 50 MG CAPS Take 1,300 mg by mouth daily.      Ipratropium-Albuterol (COMBIVENT) 20-100 MCG/ACT AERS respimat Inhale 1 puff into the lungs every 6 (six) hours.     montelukast (SINGULAIR) 10 MG tablet Take 1 tablet (10 mg total) by mouth at bedtime. 90 tablet 1   warfarin (COUMADIN) 5 MG tablet Take 1 tablet (5 mg total) by mouth daily. 90 tablet 1   zinc gluconate 50 MG tablet Take by  mouth.     No current facility-administered medications for this visit.      Marland Kitchen  PHYSICAL EXAMINATION: ECOG PERFORMANCE STATUS: 0 - Asymptomatic  Vitals:   03/03/24 1341 03/03/24 1401  BP: (!) 156/83 (!) 150/74  Pulse: (!) 57   Temp: 98 F (36.7 C)   SpO2: 97%      There were no vitals filed for this visit.  Physical Exam Constitutional:      Comments: Elderly appearing Caucasian male patient accompanied by his wife.  Is walking himself.  HENT:     Head: Normocephalic and atraumatic.     Mouth/Throat:     Pharynx: No oropharyngeal exudate.  Eyes:     Pupils: Pupils are equal, round, and reactive to light.  Cardiovascular:     Rate and Rhythm: Normal rate and regular rhythm.  Pulmonary:     Effort: Pulmonary effort is normal. No respiratory distress.     Breath sounds: Normal breath sounds. No wheezing.  Abdominal:     General: Bowel sounds are normal. There is no distension.     Palpations: Abdomen is soft. There is no mass.     Tenderness: There is no abdominal tenderness. There is no guarding or rebound.  Musculoskeletal:        General: No tenderness. Normal range of motion.     Cervical back: Normal range of motion and neck supple.  Skin:    General: Skin is warm.  Neurological:      Mental Status: He is alert and oriented to person, place, and time.  Psychiatric:        Mood and Affect: Affect normal.      LABORATORY DATA:  I have reviewed the data as listed Lab Results  Component Value Date   WBC 4.9 03/03/2024   HGB 13.7 03/03/2024   HCT 41.1 03/03/2024   MCV 99.3 03/03/2024   PLT 172 03/03/2024   Recent Labs    07/02/23 1101 08/08/23 1245 08/09/23 0456 09/04/23 1325 11/01/23 1357 12/03/23 1326 03/03/24 1341  NA 146* 136   < > 139 143 140 141  K 4.2 4.0   < > 3.9 4.4 4.2 4.5  CL 107* 101   < > 108 109* 106 108  CO2 24 26   < > 24 25 25 26   GLUCOSE 94 112*   < > 89 95 99 88  BUN 15 18   < > 22 18 18 21   CREATININE 1.88* 1.76*   < > 1.90* 1.87* 1.90* 1.97*  CALCIUM 9.5 9.5   < > 9.0 9.6 9.1 9.0  GFRNONAA  --  37*   < > 33*  --  33* 32*  PROT 6.5 7.6  --   --  6.5  --   --   ALBUMIN 3.8 3.9  --   --  3.7  --   --   AST 33 64*  --   --  39  --   --   ALT 30 42  --   --  31  --   --   ALKPHOS 160* 120  --   --  116  --   --   BILITOT 0.5 1.3*  --   --  0.5  --   --    < > = values in this interval not displayed.    RADIOGRAPHIC STUDIES: I have personally reviewed the radiological images as listed and agreed with the findings in the report. No results found.  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 Stable- on Coumadin/warfarin- on 5 mg a day- [no major fluctuations noted on hedgehog inhibitor].  INR from today-2.8. continue currentt dose at 5 mg/day.   # Basal cell carcinoma: on Hedgehog inhibitor intermittent [Dr.Dasher]- stable.  # Fatigue/ muscle cramps-suspect from intermittent vismodegib. stable.   #CKD-III [GFR 30s]-STABLE; Dr.Lateef-  stable.   # DISPOSITION: # PT/INR every month x 6  # in 6 months- MD; cbc/bmp/Pt/INR-Dr.B  Cc;    All questions were answered. The patient knows to call the  clinic with any problems, questions or concerns.    Earna Coder, MD 03/03/2024 2:22 PM

## 2024-03-03 NOTE — Assessment & Plan Note (Addendum)
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 Stable- on Coumadin/warfarin- on 5 mg a day- [no major fluctuations noted on hedgehog inhibitor].  INR from today-2.8. continue currentt dose at 5 mg/day.   # Basal cell carcinoma: on Hedgehog inhibitor intermittent [Dr.Dasher]- stable.  # Fatigue/ muscle cramps-suspect from intermittent vismodegib. stable.   #CKD-III [GFR 30s]-STABLE; Dr.Lateef-  stable.   # DISPOSITION: # PT/INR every month x 6  # in 6 months- MD; cbc/bmp/Pt/INR-Dr.B  Cc;

## 2024-03-03 NOTE — Progress Notes (Signed)
 B/L thigh pain 2/10 since sometime today. No injury.

## 2024-03-09 DIAGNOSIS — U099 Post covid-19 condition, unspecified: Secondary | ICD-10-CM

## 2024-03-09 DIAGNOSIS — Z556 Problems related to health literacy: Secondary | ICD-10-CM

## 2024-03-09 DIAGNOSIS — Z7951 Long term (current) use of inhaled steroids: Secondary | ICD-10-CM

## 2024-03-09 DIAGNOSIS — I82512 Chronic embolism and thrombosis of left femoral vein: Secondary | ICD-10-CM | POA: Diagnosis not present

## 2024-03-09 DIAGNOSIS — Z7901 Long term (current) use of anticoagulants: Secondary | ICD-10-CM

## 2024-03-09 DIAGNOSIS — N189 Chronic kidney disease, unspecified: Secondary | ICD-10-CM | POA: Diagnosis not present

## 2024-03-09 DIAGNOSIS — I129 Hypertensive chronic kidney disease with stage 1 through stage 4 chronic kidney disease, or unspecified chronic kidney disease: Secondary | ICD-10-CM | POA: Diagnosis not present

## 2024-03-09 DIAGNOSIS — E538 Deficiency of other specified B group vitamins: Secondary | ICD-10-CM | POA: Diagnosis not present

## 2024-03-09 DIAGNOSIS — G9331 Postviral fatigue syndrome: Secondary | ICD-10-CM

## 2024-04-02 ENCOUNTER — Ambulatory Visit: Payer: Medicare Other

## 2024-04-02 DIAGNOSIS — E538 Deficiency of other specified B group vitamins: Secondary | ICD-10-CM | POA: Diagnosis not present

## 2024-04-02 MED ORDER — CYANOCOBALAMIN 1000 MCG/ML IJ SOLN
1000.0000 ug | Freq: Once | INTRAMUSCULAR | Status: AC
  Administered 2024-04-02: 1000 ug via INTRAMUSCULAR

## 2024-04-02 NOTE — Progress Notes (Signed)
 Patient is in office today for a nurse visit for B12 Injection. Patient Injection was given in the  Left deltoid. Patient tolerated injection well.

## 2024-04-03 ENCOUNTER — Inpatient Hospital Stay: Attending: Internal Medicine

## 2024-04-03 DIAGNOSIS — I82512 Chronic embolism and thrombosis of left femoral vein: Secondary | ICD-10-CM

## 2024-04-03 LAB — PROTIME-INR
INR: 2.4 — ABNORMAL HIGH (ref 0.8–1.2)
Prothrombin Time: 26.5 s — ABNORMAL HIGH (ref 11.4–15.2)

## 2024-04-23 DIAGNOSIS — E538 Deficiency of other specified B group vitamins: Secondary | ICD-10-CM | POA: Diagnosis not present

## 2024-04-23 DIAGNOSIS — N189 Chronic kidney disease, unspecified: Secondary | ICD-10-CM | POA: Diagnosis not present

## 2024-04-23 DIAGNOSIS — I82512 Chronic embolism and thrombosis of left femoral vein: Secondary | ICD-10-CM | POA: Diagnosis not present

## 2024-04-23 DIAGNOSIS — I129 Hypertensive chronic kidney disease with stage 1 through stage 4 chronic kidney disease, or unspecified chronic kidney disease: Secondary | ICD-10-CM | POA: Diagnosis not present

## 2024-04-23 DIAGNOSIS — Z7901 Long term (current) use of anticoagulants: Secondary | ICD-10-CM

## 2024-04-23 DIAGNOSIS — Z7951 Long term (current) use of inhaled steroids: Secondary | ICD-10-CM

## 2024-04-23 DIAGNOSIS — Z8616 Personal history of COVID-19: Secondary | ICD-10-CM

## 2024-04-23 DIAGNOSIS — G9331 Postviral fatigue syndrome: Secondary | ICD-10-CM

## 2024-04-23 DIAGNOSIS — Z556 Problems related to health literacy: Secondary | ICD-10-CM

## 2024-04-30 ENCOUNTER — Other Ambulatory Visit: Payer: Self-pay | Admitting: *Deleted

## 2024-04-30 DIAGNOSIS — Z7901 Long term (current) use of anticoagulants: Secondary | ICD-10-CM

## 2024-05-01 ENCOUNTER — Inpatient Hospital Stay: Attending: Internal Medicine

## 2024-05-01 DIAGNOSIS — I82512 Chronic embolism and thrombosis of left femoral vein: Secondary | ICD-10-CM | POA: Diagnosis present

## 2024-05-01 DIAGNOSIS — Z7901 Long term (current) use of anticoagulants: Secondary | ICD-10-CM

## 2024-05-01 LAB — PROTIME-INR
INR: 2.5 — ABNORMAL HIGH (ref 0.8–1.2)
Prothrombin Time: 27.6 s — ABNORMAL HIGH (ref 11.4–15.2)

## 2024-05-12 ENCOUNTER — Ambulatory Visit (INDEPENDENT_AMBULATORY_CARE_PROVIDER_SITE_OTHER): Payer: Medicare Other | Admitting: Family Medicine

## 2024-05-12 ENCOUNTER — Encounter: Payer: Self-pay | Admitting: Family Medicine

## 2024-05-12 ENCOUNTER — Ambulatory Visit

## 2024-05-12 VITALS — BP 108/68 | HR 69 | Temp 97.9°F | Ht 67.0 in | Wt 168.8 lb

## 2024-05-12 DIAGNOSIS — D6869 Other thrombophilia: Secondary | ICD-10-CM

## 2024-05-12 DIAGNOSIS — I129 Hypertensive chronic kidney disease with stage 1 through stage 4 chronic kidney disease, or unspecified chronic kidney disease: Secondary | ICD-10-CM

## 2024-05-12 DIAGNOSIS — R5382 Chronic fatigue, unspecified: Secondary | ICD-10-CM

## 2024-05-12 DIAGNOSIS — N184 Chronic kidney disease, stage 4 (severe): Secondary | ICD-10-CM | POA: Diagnosis not present

## 2024-05-12 DIAGNOSIS — E538 Deficiency of other specified B group vitamins: Secondary | ICD-10-CM | POA: Diagnosis not present

## 2024-05-12 DIAGNOSIS — Z136 Encounter for screening for cardiovascular disorders: Secondary | ICD-10-CM

## 2024-05-12 DIAGNOSIS — Z0000 Encounter for general adult medical examination without abnormal findings: Secondary | ICD-10-CM

## 2024-05-12 DIAGNOSIS — R0683 Snoring: Secondary | ICD-10-CM

## 2024-05-12 LAB — MICROALBUMIN, URINE WAIVED
Creatinine, Urine Waived: 300 mg/dL (ref 10–300)
Microalb, Ur Waived: 150 mg/L — ABNORMAL HIGH (ref 0–19)

## 2024-05-12 MED ORDER — MONTELUKAST SODIUM 10 MG PO TABS: 10.0000 mg | ORAL_TABLET | Freq: Every day | ORAL | 1 refills | Status: DC

## 2024-05-12 MED ORDER — AMLODIPINE BESYLATE 2.5 MG PO TABS: 2.5000 mg | ORAL_TABLET | Freq: Every day | ORAL | 1 refills | Status: DC

## 2024-05-12 MED ORDER — GABAPENTIN 100 MG PO CAPS: 200.0000 mg | ORAL_CAPSULE | Freq: Every day | ORAL | 1 refills | Status: DC

## 2024-05-12 MED ORDER — CYANOCOBALAMIN 1000 MCG/ML IJ SOLN
1000.0000 ug | INTRAMUSCULAR | Status: AC
  Administered 2024-05-12 – 2025-01-12 (×8): 1000 ug via INTRAMUSCULAR

## 2024-05-12 MED ORDER — FLUTICASONE PROPIONATE 50 MCG/ACT NA SUSP: 2.0000 | Freq: Every day | NASAL | 6 refills | Status: DC

## 2024-05-12 MED ORDER — ALBUTEROL SULFATE HFA 108 (90 BASE) MCG/ACT IN AERS: 2.0000 | INHALATION_SPRAY | Freq: Four times a day (QID) | RESPIRATORY_TRACT | 1 refills | Status: DC | PRN

## 2024-05-12 MED ORDER — CETIRIZINE HCL 5 MG PO TABS: 5.0000 mg | ORAL_TABLET | Freq: Every day | ORAL | 3 refills | Status: AC

## 2024-05-12 MED ORDER — DONEPEZIL HCL 10 MG PO TABS: 10.0000 mg | ORAL_TABLET | Freq: Every day | ORAL | 1 refills | Status: DC

## 2024-05-12 NOTE — Addendum Note (Signed)
 Addended by: Leydi Winstead M on: 05/12/2024 02:05 PM   Modules accepted: Orders

## 2024-05-12 NOTE — Assessment & Plan Note (Signed)
 B12 shot given today. Labs drawn. Continue to monitor. Call with any concerns.

## 2024-05-12 NOTE — Progress Notes (Signed)
 BP 108/68 (BP Location: Left Arm, Patient Position: Sitting, Cuff Size: Normal)   Pulse 69   Temp 97.9 F (36.6 C) (Oral)   Ht 5\' 7"  (1.702 m)   Wt 168 lb 12.8 oz (76.6 kg)   SpO2 95%   BMI 26.44 kg/m    Subjective:    Patient ID: Gregg Thomas, male    DOB: 1931-11-26, 89 y.o.   MRN: [id number]  HPI: Gregg Thomas is a 89 y.o. male presenting on 05/12/2024 for comprehensive medical examination. Current medical complaints include:  HYPERTENSION  Hypertension status: controlled  Satisfied with current treatment? yes Duration of hypertension: chronic BP medication side effects:  no Medication compliance: excellent compliance Previous BP meds: amlodipine  Aspirin: no Recurrent headaches: no Visual changes: no Palpitations: no Dyspnea: no Chest pain: no Lower extremity edema: no Dizzy/lightheaded: no  FATIGUE Duration:  chronic Severity: severe  Onset: gradual Context when symptoms started:  unknown Symptoms improve with rest: yes  Depressive symptoms: no Stress/anxiety: no Insomnia: no  Snoring: yes Observed apnea by bed partner: no Daytime hypersomnolence:yes Wakes feeling refreshed: yes History of sleep study: no Dysnea on exertion:  no Orthopnea/PND: no Chest pain: no Chronic cough: no Lower extremity edema: no Arthralgias:no Myalgias: no Weakness: no Rash: no   He currently lives with: wife Interim Problems from his last visit: no  Depression Screen done today and results listed below:     05/12/2024    1:35 PM 02/03/2024    1:18 PM 08/15/2023    4:12 PM 08/01/2023    1:15 PM 07/17/2023    3:54 PM  Depression screen PHQ 2/9  Decreased Interest 1 1 1 1 1   Down, Depressed, Hopeless 1 0 1 1 1   PHQ - 2 Score 2 1 2 2 2   Altered sleeping 1 1 1 1 2   Tired, decreased energy 1 2 2 2 2   Change in appetite 0 0 0 0 0  Feeling bad or failure about yourself  0 0 0 0 0  Trouble concentrating 1 2 1 1 2   Moving slowly or fidgety/restless 0 1  0 1 1  Suicidal thoughts 0 0 0 0 0  PHQ-9 Score 5 7 6 7 9   Difficult doing work/chores Somewhat difficult Not difficult at all Somewhat difficult Somewhat difficult Somewhat difficult    Past Medical History:  Past Medical History:  Diagnosis Date   Chronic kidney disease    Clotting disorder (HCC)    Dementia (HCC)    Heart murmur    Skin cancer     Surgical History:  Past Surgical History:  Procedure Laterality Date   biopsy Right 08/26/2019   temple biopsy - dr.dasher    EYE SURGERY Right    benign growth on right eye    KNEE SURGERY Right    MELANOMA EXCISION Bilateral    lower back     Medications:  Current Outpatient Medications on File Prior to Visit  Medication Sig   ascorbic acid  (VITAMIN C ) 1000 MG tablet Take by mouth.   Cholecalciferol (VITAMIN D3 PO) Take 1 tablet by mouth daily.   Cyanocobalamin  (VITAMIN B-12 ER PO) Take 1,000 mcg by mouth daily at 6 (six) AM.   ELDERBERRY PO Take 1 tablet by mouth daily.   Grape Seed Extract 50 MG CAPS Take 1,300 mg by mouth daily.    Ipratropium-Albuterol  (COMBIVENT ) 20-100 MCG/ACT AERS respimat Inhale 1 puff into the lungs every 6 (six) hours.   warfarin (COUMADIN ) 5 MG  tablet Take 1 tablet (5 mg total) by mouth daily.   zinc  gluconate 50 MG tablet Take by mouth.   No current facility-administered medications on file prior to visit.    Allergies:  No Known Allergies  Social History:  Social History   Socioeconomic History   Marital status: Married    Spouse name: Not on file   Number of children: Not on file   Years of education: 13 years    Highest education level: Some college, no degree  Occupational History   Occupation: retired  Tobacco Use   Smoking status: Never   Smokeless tobacco: Never  Vaping Use   Vaping status: Never Used  Substance and Sexual Activity   Alcohol use: No   Drug use: No   Sexual activity: Not Currently  Other Topics Concern   Not on file  Social History Narrative    [employer]    [place]       Golfing 2 times a week, alk 3 times a week    Social Drivers of Corporate investment banker Strain: Low Risk  (09/24/2022)   Overall Financial Resource Strain (CARDIA)    Difficulty of Paying Living Expenses: Not hard at all  Food Insecurity: No Food Insecurity (05/12/2024)   Hunger Vital Sign    Worried About Running Out of Food in the Last Year: Never true    Ran Out of Food in the Last Year: Never true  Transportation Needs: No Transportation Needs (08/08/2023)   PRAPARE - Administrator, Civil Service (Medical): No    Lack of Transportation (Non-Medical): No  Physical Activity: Insufficiently Active (09/24/2022)   Exercise Vital Sign    Days of Exercise per Week: 2 days    Minutes of Exercise per Session: 20 min  Stress: No Stress Concern Present (09/24/2022)   [employer] of Occupational Health - Occupational Stress Questionnaire    Feeling of Stress : Not at all  Social Connections: Moderately Integrated (09/24/2022)   Social Connection and Isolation Panel [NHANES]    Frequency of Communication with Friends and Family: Once a week    Frequency of Social Gatherings with Friends and Family: Once a week    Attends Religious Services: More than 4 times per year    Active Member of [employer] or Organizations: No    Attends Engineer, structural: More than 4 times per year    Marital Status: Married  Catering manager Violence: Not At Risk (08/08/2023)   Humiliation, Afraid, Rape, and Kick questionnaire    Fear of Current or Ex-Partner: No    Emotionally Abused: No    Physically Abused: No    Sexually Abused: No   Social History   Tobacco Use  Smoking Status Never  Smokeless Tobacco Never   Social History   Substance and Sexual Activity  Alcohol Use No    Family History:  Family History  Problem Relation Age of Onset   Stroke Mother    Cerebral aneurysm Father     Past medical history,  surgical history, medications, allergies, family history and social history reviewed with patient today and changes made to appropriate areas of the chart.   Review of Systems  Constitutional:  Positive for malaise/fatigue. Negative for chills, diaphoresis, fever and weight loss.  HENT: Negative.    Eyes: Negative.   Respiratory: Negative.    Cardiovascular: Negative.   Gastrointestinal: Negative.   Genitourinary: Negative.   Musculoskeletal: Negative.  Skin: Negative.   Neurological: Negative.   Endo/Heme/Allergies:  Negative for environmental allergies and polydipsia. Bruises/bleeds easily.  Psychiatric/Behavioral: Negative.     All other ROS negative except what is listed above and in the HPI.      Objective:     BP 108/68 (BP Location: Left Arm, Patient Position: Sitting, Cuff Size: Normal)   Pulse 69   Temp 97.9 F (36.6 C) (Oral)   Ht 5\' 7"  (1.702 m)   Wt 168 lb 12.8 oz (76.6 kg)   SpO2 95%   BMI 26.44 kg/m   Wt Readings from Last 3 Encounters:  05/12/24 168 lb 12.8 oz (76.6 kg)  02/03/24 165 lb 6.4 oz (75 kg)  11/01/23 160 lb 3.2 oz (72.7 kg)    Physical Exam Vitals and nursing note reviewed.  Constitutional:      General: He is not in acute distress.    Appearance: Normal appearance. He is not ill-appearing, toxic-appearing or diaphoretic.  HENT:     Head: Normocephalic and atraumatic.     Right Ear: Tympanic membrane, ear canal and external ear normal. There is no impacted cerumen.     Left Ear: Tympanic membrane, ear canal and external ear normal. There is no impacted cerumen.     Nose: Nose normal. No congestion or rhinorrhea.     Mouth/Throat:     Mouth: Mucous membranes are moist.     Pharynx: Oropharynx is clear. No oropharyngeal exudate or posterior oropharyngeal erythema.  Eyes:     General: No scleral icterus.       Right eye: No discharge.        Left eye: No discharge.     Extraocular Movements: Extraocular movements intact.      Conjunctiva/sclera: Conjunctivae normal.     Pupils: Pupils are equal, round, and reactive to light.  Neck:     Vascular: No carotid bruit.  Cardiovascular:     Rate and Rhythm: Normal rate and regular rhythm.     Pulses: Normal pulses.     Heart sounds: No murmur heard.    No friction rub. No gallop.  Pulmonary:     Effort: Pulmonary effort is normal. No respiratory distress.     Breath sounds: Normal breath sounds. No stridor. No wheezing, rhonchi or rales.  Chest:     Chest wall: No tenderness.  Abdominal:     General: Abdomen is flat. Bowel sounds are normal. There is no distension.     Palpations: Abdomen is soft. There is no mass.     Tenderness: There is no abdominal tenderness. There is no right CVA tenderness, left CVA tenderness, guarding or rebound.     Hernia: No hernia is present.  Genitourinary:    Comments: Genital exam deferred with shared decision making Musculoskeletal:        General: No swelling, tenderness, deformity or signs of injury.     Cervical back: Normal range of motion and neck supple. No rigidity. No muscular tenderness.     Right lower leg: No edema.     Left lower leg: No edema.  Lymphadenopathy:     Cervical: No cervical adenopathy.  Skin:    General: Skin is warm and dry.     Capillary Refill: Capillary refill takes less than 2 seconds.     Coloration: Skin is not jaundiced or pale.     Findings: No bruising, erythema, lesion or rash.  Neurological:     General: No focal deficit present.  Mental Status: He is alert and oriented to person, place, and time.     Cranial Nerves: No cranial nerve deficit.     Sensory: No sensory deficit.     Motor: No weakness.     Coordination: Coordination normal.     Gait: Gait normal.     Deep Tendon Reflexes: Reflexes normal.  Psychiatric:        Mood and Affect: Mood normal.        Behavior: Behavior normal.        Thought Content: Thought content normal.        Judgment: Judgment normal.      Results for orders placed or performed in visit on 05/01/24  Protime-INR   Collection Time: 05/01/24  1:14 PM  Result Value Ref Range   Prothrombin Time 27.6 (H) 11.4 - 15.2 seconds   INR 2.5 (H) 0.8 - 1.2      Assessment & Plan:   Problem List Items Addressed This Visit       Genitourinary   CKD (chronic kidney disease) stage 4, GFR 15-29 ml/min (HCC)   Labs drawn. Continue to monitor. Call with any concerns.       Relevant Orders   Comprehensive metabolic panel with GFR   Benign hypertensive renal disease   Under good control on current regimen. Continue current regimen. Continue to monitor. Call with any concerns. Refills given. Labs drawn today.       Relevant Orders   Comprehensive metabolic panel with GFR   TSH   Microalbumin, Urine Waived     Hematopoietic and Hemostatic   Acquired thrombophilia (HCC)   Labs drawn. Continue to monitor. Call with any concerns.       Relevant Orders   Comprehensive metabolic panel with GFR   CBC with Differential/Platelet     Other   B12 deficiency   B12 shot given today. Labs drawn. Continue to monitor. Call with any concerns.       Relevant Orders   B12   Other Visit Diagnoses       Routine general medical examination at a health care facility    -  Primary   Vaccines up to date. Screening labs checked today. Continue diet and exercise. Call with any concerns.     Screening for cardiovascular condition       Labs drawn today. Await results,   Relevant Orders   Comprehensive metabolic panel with GFR   Lipid Panel w/o Chol/HDL Ratio     Snoring       Will check sleep study. Await results,   Relevant Orders   Ambulatory referral to Sleep Studies     Chronic fatigue       Will check sleep study and labs. Await results,   Relevant Orders   Lyme Disease Serology w/Reflex   VITAMIN D  25 Hydroxy (Vit-D Deficiency, Fractures)   Spotted Fever Group Antibodies        LABORATORY TESTING:  Health maintenance  labs ordered today as discussed above.   IMMUNIZATIONS:   - Tdap: Tetanus vaccination status reviewed: last tetanus booster within 10 years. - Influenza: Up to date - Pneumovax: Up to date - Prevnar: Up to date - COVID: Refused - HPV: Not applicable - Shingrix vaccine: Refused  SCREENING: - Colonoscopy: Not applicable  Discussed with patient purpose of the colonoscopy is to detect colon cancer at curable precancerous or early stages   PATIENT COUNSELING:    Sexuality: Discussed sexually transmitted diseases, partner selection,  use of condoms, avoidance of unintended pregnancy  and contraceptive alternatives.   Advised to avoid cigarette smoking.  I discussed with the patient that most people either abstain from alcohol or drink within safe limits (<=14/week and <=4 drinks/occasion for males, <=7/weeks and <= 3 drinks/occasion for females) and that the risk for alcohol disorders and other health effects rises proportionally with the number of drinks per week and how often a drinker exceeds daily limits.  Discussed cessation/primary prevention of drug use and availability of treatment for abuse.   Diet: Encouraged to adjust caloric intake to maintain  or achieve ideal body weight, to reduce intake of dietary saturated fat and total fat, to limit sodium intake by avoiding high sodium foods and not adding table salt, and to maintain adequate dietary potassium and calcium preferably from fresh fruits, vegetables, and low-fat dairy products.    stressed the importance of regular exercise  Injury prevention: Discussed safety belts, safety helmets, smoke detector, smoking near bedding or upholstery.   Dental health: Discussed importance of regular tooth brushing, flossing, and dental visits.   Follow up plan: NEXT PREVENTATIVE PHYSICAL DUE IN 1 YEAR. Return in about 6 weeks (around 06/23/2024).

## 2024-05-12 NOTE — Assessment & Plan Note (Signed)
 Labs drawn. Continue to monitor. Call with any concerns.

## 2024-05-12 NOTE — Assessment & Plan Note (Signed)
 Under good control on current regimen. Continue current regimen. Continue to monitor. Call with any concerns. Refills given. Labs drawn today.

## 2024-05-13 LAB — CBC WITH DIFFERENTIAL/PLATELET
Basophils Absolute: 0 10*3/uL (ref 0.0–0.2)
Basos: 1 %
EOS (ABSOLUTE): 0.5 10*3/uL — ABNORMAL HIGH (ref 0.0–0.4)
Eos: 10 %
Hematocrit: 43.5 % (ref 37.5–51.0)
Hemoglobin: 14.6 g/dL (ref 13.0–17.7)
Immature Grans (Abs): 0 10*3/uL (ref 0.0–0.1)
Immature Granulocytes: 1 %
Lymphocytes Absolute: 1.1 10*3/uL (ref 0.7–3.1)
Lymphs: 22 %
MCH: 33.1 pg — ABNORMAL HIGH (ref 26.6–33.0)
MCHC: 33.6 g/dL (ref 31.5–35.7)
MCV: 99 fL — ABNORMAL HIGH (ref 79–97)
Monocytes Absolute: 0.7 10*3/uL (ref 0.1–0.9)
Monocytes: 15 %
Neutrophils Absolute: 2.6 10*3/uL (ref 1.4–7.0)
Neutrophils: 51 %
Platelets: 216 10*3/uL (ref 150–450)
RBC: 4.41 x10E6/uL (ref 4.14–5.80)
RDW: 13.6 % (ref 11.6–15.4)
WBC: 5 10*3/uL (ref 3.4–10.8)

## 2024-05-13 LAB — COMPREHENSIVE METABOLIC PANEL WITH GFR
ALT: 35 IU/L (ref 0–44)
AST: 48 IU/L — ABNORMAL HIGH (ref 0–40)
Albumin: 3.8 g/dL (ref 3.6–4.6)
Alkaline Phosphatase: 151 IU/L — ABNORMAL HIGH (ref 44–121)
BUN/Creatinine Ratio: 12 (ref 10–24)
BUN: 24 mg/dL (ref 10–36)
Bilirubin Total: 0.5 mg/dL (ref 0.0–1.2)
CO2: 23 mmol/L (ref 20–29)
Calcium: 9.9 mg/dL (ref 8.6–10.2)
Chloride: 107 mmol/L — ABNORMAL HIGH (ref 96–106)
Creatinine, Ser: 1.99 mg/dL — ABNORMAL HIGH (ref 0.76–1.27)
Globulin, Total: 3.1 g/dL (ref 1.5–4.5)
Glucose: 92 mg/dL (ref 70–99)
Potassium: 4.4 mmol/L (ref 3.5–5.2)
Sodium: 143 mmol/L (ref 134–144)
Total Protein: 6.9 g/dL (ref 6.0–8.5)
eGFR: 31 mL/min/{1.73_m2} — ABNORMAL LOW (ref >59)

## 2024-05-13 LAB — LIPID PANEL W/O CHOL/HDL RATIO
Cholesterol, Total: 161 mg/dL (ref 100–199)
HDL: 30 mg/dL — ABNORMAL LOW (ref >39)
LDL Chol Calc (NIH): 94 mg/dL (ref 0–99)
Triglycerides: 216 mg/dL — ABNORMAL HIGH (ref 0–149)
VLDL Cholesterol Cal: 37 mg/dL (ref 5–40)

## 2024-05-13 LAB — VITAMIN B12: Vitamin B-12: >2000 pg/mL — ABNORMAL HIGH (ref 232–1245)

## 2024-05-13 LAB — TSH: TSH: 3.56 u[IU]/mL (ref 0.450–4.500)

## 2024-05-13 LAB — VITAMIN D 25 HYDROXY (VIT D DEFICIENCY, FRACTURES): Vit D, 25-Hydroxy: 34.4 ng/mL (ref 30.0–100.0)

## 2024-05-14 LAB — SPOTTED FEVER GROUP ANTIBODIES

## 2024-05-14 LAB — LYME DISEASE SEROLOGY W/REFLEX: Lyme Total Antibody EIA: NEGATIVE

## 2024-05-15 ENCOUNTER — Ambulatory Visit: Payer: Self-pay | Admitting: Family Medicine

## 2024-05-18 NOTE — Telephone Encounter (Signed)
Results printed and mailed.   

## 2024-05-28 ENCOUNTER — Other Ambulatory Visit: Payer: Self-pay | Admitting: *Deleted

## 2024-05-28 DIAGNOSIS — I82512 Chronic embolism and thrombosis of left femoral vein: Secondary | ICD-10-CM

## 2024-05-28 DIAGNOSIS — Z7901 Long term (current) use of anticoagulants: Secondary | ICD-10-CM

## 2024-05-29 ENCOUNTER — Inpatient Hospital Stay

## 2024-05-29 DIAGNOSIS — I82512 Chronic embolism and thrombosis of left femoral vein: Secondary | ICD-10-CM | POA: Diagnosis not present

## 2024-05-29 DIAGNOSIS — Z7901 Long term (current) use of anticoagulants: Secondary | ICD-10-CM

## 2024-05-29 LAB — PROTIME-INR
INR: 2.3 — ABNORMAL HIGH (ref 0.8–1.2)
Prothrombin Time: 25.7 s — ABNORMAL HIGH (ref 11.4–15.2)

## 2024-06-10 ENCOUNTER — Ambulatory Visit: Admitting: Family Medicine

## 2024-06-10 ENCOUNTER — Ambulatory Visit: Payer: Self-pay | Admitting: Family Medicine

## 2024-06-10 ENCOUNTER — Encounter: Payer: Self-pay | Admitting: Family Medicine

## 2024-06-10 ENCOUNTER — Ambulatory Visit
Admission: RE | Admit: 2024-06-10 | Discharge: 2024-06-10 | Disposition: A | Discharge status: Home / Self Care | Source: Ambulatory Visit | Attending: Family Medicine | Admitting: Family Medicine

## 2024-06-10 VITALS — BP 113/71 | HR 64 | Ht 67.0 in | Wt 165.0 lb

## 2024-06-10 DIAGNOSIS — R0609 Other forms of dyspnea: Secondary | ICD-10-CM

## 2024-06-10 DIAGNOSIS — R269 Unspecified abnormalities of gait and mobility: Secondary | ICD-10-CM | POA: Diagnosis present

## 2024-06-10 DIAGNOSIS — I129 Hypertensive chronic kidney disease with stage 1 through stage 4 chronic kidney disease, or unspecified chronic kidney disease: Secondary | ICD-10-CM

## 2024-06-10 DIAGNOSIS — N1832 Chronic kidney disease, stage 3b: Secondary | ICD-10-CM

## 2024-06-10 DIAGNOSIS — D692 Other nonthrombocytopenic purpura: Secondary | ICD-10-CM

## 2024-06-10 DIAGNOSIS — F0390 Unspecified dementia without behavioral disturbance: Secondary | ICD-10-CM | POA: Insufficient documentation

## 2024-06-10 DIAGNOSIS — N184 Chronic kidney disease, stage 4 (severe): Secondary | ICD-10-CM

## 2024-06-10 DIAGNOSIS — R29898 Other symptoms and signs involving the musculoskeletal system: Secondary | ICD-10-CM

## 2024-06-10 DIAGNOSIS — R410 Disorientation, unspecified: Secondary | ICD-10-CM | POA: Insufficient documentation

## 2024-06-10 DIAGNOSIS — E559 Vitamin D deficiency, unspecified: Secondary | ICD-10-CM

## 2024-06-10 DIAGNOSIS — I82512 Chronic embolism and thrombosis of left femoral vein: Secondary | ICD-10-CM

## 2024-06-10 DIAGNOSIS — D6869 Other thrombophilia: Secondary | ICD-10-CM

## 2024-06-10 DIAGNOSIS — R413 Other amnesia: Secondary | ICD-10-CM

## 2024-06-10 DIAGNOSIS — F03A0 Unspecified dementia, mild, without behavioral disturbance, psychotic disturbance, mood disturbance, and anxiety: Secondary | ICD-10-CM

## 2024-06-10 DIAGNOSIS — E538 Deficiency of other specified B group vitamins: Secondary | ICD-10-CM

## 2024-06-10 DIAGNOSIS — U071 COVID-19: Secondary | ICD-10-CM

## 2024-06-10 DIAGNOSIS — E2089 Other specified hypoparathyroidism: Secondary | ICD-10-CM

## 2024-06-10 DIAGNOSIS — C44320 Squamous cell carcinoma of skin of unspecified parts of face: Secondary | ICD-10-CM

## 2024-06-10 DIAGNOSIS — E875 Hyperkalemia: Secondary | ICD-10-CM

## 2024-06-10 LAB — MICROSCOPIC EXAMINATION
Bacteria, UA: NONE SEEN
RBC, Urine: NONE SEEN /HPF (ref 0–2)

## 2024-06-10 LAB — URINALYSIS, ROUTINE W REFLEX MICROSCOPIC
Bilirubin, UA: NEGATIVE
Glucose, UA: NEGATIVE
Leukocytes,UA: NEGATIVE
Nitrite, UA: NEGATIVE
RBC, UA: NEGATIVE
Specific Gravity, UA: >1.03 — ABNORMAL HIGH (ref 1.005–1.030)
Urobilinogen, Ur: 0.2 mg/dL (ref 0.2–1.0)
pH, UA: 5.5 (ref 5.0–7.5)

## 2024-06-10 NOTE — Progress Notes (Signed)
 BP 113/71 (BP Location: Left Arm, Patient Position: Sitting, Cuff Size: Normal)   Pulse 64   Ht 5' 7 (1.702 m)   Wt 165 lb (74.8 kg)   SpO2 94%   BMI 25.84 kg/m    Subjective:    Patient ID: Gregg Thomas, male    DOB: [birth year], 89 y.o.   MRN: [id number]  HPI: Gregg Thomas is a 89 y.o. male  Chief Complaint  Patient presents with   Fatigue   GAIT OFF BALANCE    More loss of motor skills    Memory Loss   Gregg Thomas presents today with his wife. She notes that he is not doing well. Last week he was doing well. He notes that in the past couple of days, he has had more fatigue, he has been walking more poorly, he has been shuffling more. He has been more tired. His wife cut back on his gabapentin  down to 1 pill at night. It has not changed anything. He felt really tired and weak this morning when he got into the shower he sat down and he was not able to get back up again. His wife was very concerned that she was not going to be able to get him up. She is very worried about him and is wondering what has changed. She would like to get him into see neurology. PT has been coming out weekly, but they are about to go into a pause due to insurance. No other concerns or complaints at this time.   Relevant past medical, surgical, family and social history reviewed and updated as indicated. Interim medical history since our last visit reviewed. Allergies and medications reviewed and updated.  Review of Systems  Constitutional:  Positive for activity change and fatigue. Negative for appetite change, chills, diaphoresis, fever and unexpected weight change.  HENT: Negative.    Respiratory:  Positive for shortness of breath. Negative for apnea, cough, choking, chest tightness, wheezing and stridor.   Cardiovascular: Negative.   Gastrointestinal: Negative.   Musculoskeletal: Negative.   Neurological:  Positive for dizziness, weakness and light-headedness. Negative for tremors,  seizures, syncope, facial asymmetry, speech difficulty, numbness and headaches.  Hematological: Negative.   Psychiatric/Behavioral: Negative.      Per HPI unless specifically indicated above     Objective:     BP 113/71 (BP Location: Left Arm, Patient Position: Sitting, Cuff Size: Normal)   Pulse 64   Ht 5' 7 (1.702 m)   Wt 165 lb (74.8 kg)   SpO2 94%   BMI 25.84 kg/m   Wt Readings from Last 3 Encounters:  06/10/24 165 lb (74.8 kg)  05/12/24 168 lb 12.8 oz (76.6 kg)  02/03/24 165 lb 6.4 oz (75 kg)    Physical Exam Vitals and nursing note reviewed.  Constitutional:      General: He is not in acute distress.    Appearance: Normal appearance. He is normal weight. He is not ill-appearing, toxic-appearing or diaphoretic.  HENT:     Head: Normocephalic and atraumatic.     Right Ear: External ear normal.     Left Ear: External ear normal.     Nose: Nose normal.     Mouth/Throat:     Mouth: Mucous membranes are moist.     Pharynx: Oropharynx is clear.  Eyes:     General: No scleral icterus.       Right eye: No discharge.        Left eye: No discharge.  Extraocular Movements: Extraocular movements intact.     Conjunctiva/sclera: Conjunctivae normal.     Pupils: Pupils are equal, round, and reactive to light.  Cardiovascular:     Rate and Rhythm: Normal rate. Rhythm irregular.     Pulses: Normal pulses.     Heart sounds: Normal heart sounds. No murmur heard.    No friction rub. No gallop.  Pulmonary:     Effort: Pulmonary effort is normal. No respiratory distress.     Breath sounds: Normal breath sounds. No stridor. No wheezing, rhonchi or rales.  Chest:     Chest wall: No tenderness.  Musculoskeletal:        General: Normal range of motion.     Cervical back: Normal range of motion and neck supple.  Skin:    General: Skin is warm and dry.     Capillary Refill: Capillary refill takes less than 2 seconds.     Coloration: Skin is not jaundiced or pale.      Findings: No bruising, erythema, lesion or rash.  Neurological:     General: No focal deficit present.     Mental Status: He is alert. Mental status is at baseline. He is disoriented.     Cranial Nerves: No cranial nerve deficit.     Sensory: No sensory deficit.     Motor: No weakness.     Coordination: Coordination abnormal.     Gait: Gait abnormal.     Deep Tendon Reflexes: Reflexes normal.  Psychiatric:        Mood and Affect: Mood normal.        Behavior: Behavior normal.        Thought Content: Thought content normal.        Judgment: Judgment normal.     Results for orders placed or performed in visit on 06/10/24  Microscopic Examination   Collection Time: 06/10/24 11:58 AM   Urine  Result Value Ref Range   WBC, UA 0-5 0 - 5 /hpf   RBC, Urine None seen 0 - 2 /hpf   Epithelial Cells (non renal) 0-10 0 - 10 /hpf   Crystals Present (A) N/A   Crystal Type Calcium Oxalate N/A   Bacteria, UA None seen None seen/Few  Urinalysis, Routine w reflex microscopic   Collection Time: 06/10/24 11:58 AM  Result Value Ref Range   Specific Gravity, UA >1.030 (H) 1.005 - 1.030   pH, UA 5.5 5.0 - 7.5   Color, UA Yellow Yellow   Appearance Ur Clear Clear   Leukocytes,UA Negative Negative   Protein,UA 3+ (A) Negative/Trace   Glucose, UA Negative Negative   Ketones, UA Trace (A) Negative   RBC, UA Negative Negative   Bilirubin, UA Negative Negative   Urobilinogen, Ur 0.2 0.2 - 1.0 mg/dL   Nitrite, UA Negative Negative   Microscopic Examination See below:       Assessment & Plan:   Problem List Items Addressed This Visit       Cardiovascular and Mediastinum   Chronic deep vein thrombosis (DVT) of femoral vein of left lower extremity (HCC)   Relevant Orders   Ambulatory referral to Home Health   CBC with Differential/Platelet   Comprehensive metabolic panel with GFR   TSH   VITAMIN D  25 Hydroxy (Vit-D Deficiency, Fractures)   Senile purpura (HCC)   Relevant Orders    Ambulatory referral to Home Health   CBC with Differential/Platelet   Comprehensive metabolic panel with GFR   TSH   VITAMIN  D 25 Hydroxy (Vit-D Deficiency, Fractures)     Respiratory   Dyspnea due to COVID-19   Relevant Orders   Ambulatory referral to Home Health   CBC with Differential/Platelet   Comprehensive metabolic panel with GFR   TSH   VITAMIN D  25 Hydroxy (Vit-D Deficiency, Fractures)     Endocrine   Secondary hypoparathyroidism (HCC)   Relevant Orders   Ambulatory referral to Home Health   CBC with Differential/Platelet   Comprehensive metabolic panel with GFR   TSH   VITAMIN D  25 Hydroxy (Vit-D Deficiency, Fractures)     Nervous and Auditory   Dementia (HCC)   Relevant Orders   Ambulatory referral to Neurology   CBC with Differential/Platelet   Comprehensive metabolic panel with GFR   TSH   VITAMIN D  25 Hydroxy (Vit-D Deficiency, Fractures)   Confusion - Primary   Subacute onset about 4 days ago. Concern for worsening dementia. Wife would like for him to see neurology- referral placed today. Will check CT head to r/o stroke. Will check urine and labs. Await results. Referral to home health placed today. Continue to monitor.       Relevant Orders   Urinalysis, Routine w reflex microscopic (Completed)   Ambulatory referral to Home Health   CT HEAD WO CONTRAST ( ) (Completed)   Ambulatory referral to Neurology   Urine Culture   CBC with Differential/Platelet   Comprehensive metabolic panel with GFR   TSH   VITAMIN D  25 Hydroxy (Vit-D Deficiency, Fractures)     Musculoskeletal and Integument   Squamous cell carcinoma, face   Relevant Orders   Ambulatory referral to Home Health   CBC with Differential/Platelet   Comprehensive metabolic panel with GFR   TSH   VITAMIN D  25 Hydroxy (Vit-D Deficiency, Fractures)   Muscular deconditioning   Relevant Orders   Ambulatory referral to Home Health   CBC with Differential/Platelet   Comprehensive metabolic  panel with GFR   TSH   VITAMIN D  25 Hydroxy (Vit-D Deficiency, Fractures)     Genitourinary   CKD (chronic kidney disease) stage 4, GFR 15-29 ml/min (HCC)   Relevant Orders   Ambulatory referral to Home Health   CBC with Differential/Platelet   Comprehensive metabolic panel with GFR   TSH   VITAMIN D  25 Hydroxy (Vit-D Deficiency, Fractures)   Benign hypertensive renal disease   Relevant Orders   Ambulatory referral to Home Health   CBC with Differential/Platelet   Comprehensive metabolic panel with GFR   TSH   VITAMIN D  25 Hydroxy (Vit-D Deficiency, Fractures)   CKD stage 3b, GFR 30-44 ml/min (HCC)   Relevant Orders   Ambulatory referral to Home Health   CBC with Differential/Platelet   Comprehensive metabolic panel with GFR   TSH   VITAMIN D  25 Hydroxy (Vit-D Deficiency, Fractures)     Hematopoietic and Hemostatic   Acquired thrombophilia (HCC)   Relevant Orders   Ambulatory referral to Home Health   CBC with Differential/Platelet   Comprehensive metabolic panel with GFR   TSH   VITAMIN D  25 Hydroxy (Vit-D Deficiency, Fractures)     Other   Memory loss   Relevant Orders   Ambulatory referral to Home Health   CT HEAD WO CONTRAST ( ) (Completed)   Ambulatory referral to Neurology   CBC with Differential/Platelet   Comprehensive metabolic panel with GFR   TSH   VITAMIN D  25 Hydroxy (Vit-D Deficiency, Fractures)   B12 deficiency   B12 shot given today.  Relevant Orders   Ambulatory referral to Home Health   Ambulatory referral to Neurology   CBC with Differential/Platelet   Comprehensive metabolic panel with GFR   TSH   VITAMIN D  25 Hydroxy (Vit-D Deficiency, Fractures)   Other Visit Diagnoses       DOE (dyspnea on exertion)       EKG normal. Will check labs and urine. Await results.   Relevant Orders   EKG 12-Lead   CBC with Differential/Platelet   Comprehensive metabolic panel with GFR   TSH   VITAMIN D  25 Hydroxy (Vit-D Deficiency, Fractures)      Vitamin D  deficiency       Relevant Orders   CBC with Differential/Platelet   Comprehensive metabolic panel with GFR   TSH   VITAMIN D  25 Hydroxy (Vit-D Deficiency, Fractures)     Gait abnormality       Relevant Orders   CT HEAD WO CONTRAST ( ) (Completed)   Ambulatory referral to Neurology   CBC with Differential/Platelet   Comprehensive metabolic panel with GFR   TSH   VITAMIN D  25 Hydroxy (Vit-D Deficiency, Fractures)        Follow up plan: Return in about 4 weeks (around 07/08/2024).

## 2024-06-10 NOTE — Assessment & Plan Note (Signed)
 B12 shot given today

## 2024-06-10 NOTE — Assessment & Plan Note (Addendum)
 Subacute onset about 4 days ago. Concern for worsening dementia. Wife would like for him to see neurology- referral placed today. Will check CT head to r/o stroke. Will check urine and labs. Await results. Referral to home health placed today. Continue to monitor.

## 2024-06-11 LAB — COMPREHENSIVE METABOLIC PANEL WITH GFR
ALT: 42 IU/L (ref 0–44)
AST: 45 IU/L — ABNORMAL HIGH (ref 0–40)
Albumin: 3.8 g/dL (ref 3.6–4.6)
Alkaline Phosphatase: 140 IU/L — ABNORMAL HIGH (ref 44–121)
BUN/Creatinine Ratio: 12 (ref 10–24)
BUN: 26 mg/dL (ref 10–36)
Bilirubin Total: 0.6 mg/dL (ref 0.0–1.2)
CO2: 22 mmol/L (ref 20–29)
Calcium: 12.8 mg/dL — ABNORMAL HIGH (ref 8.6–10.2)
Chloride: 106 mmol/L (ref 96–106)
Creatinine, Ser: 2.18 mg/dL — ABNORMAL HIGH (ref 0.76–1.27)
Globulin, Total: 3.5 g/dL (ref 1.5–4.5)
Glucose: 115 mg/dL — ABNORMAL HIGH (ref 70–99)
Potassium: 5.7 mmol/L — ABNORMAL HIGH (ref 3.5–5.2)
Sodium: 138 mmol/L (ref 134–144)
Total Protein: 7.3 g/dL (ref 6.0–8.5)
eGFR: 28 mL/min/{1.73_m2} — ABNORMAL LOW (ref >59)

## 2024-06-11 LAB — CBC WITH DIFFERENTIAL/PLATELET
Basophils Absolute: 0.1 10*3/uL (ref 0.0–0.2)
Basos: 1 %
EOS (ABSOLUTE): 0.3 10*3/uL (ref 0.0–0.4)
Eos: 5 %
Hematocrit: 45.6 % (ref 37.5–51.0)
Hemoglobin: 14.9 g/dL (ref 13.0–17.7)
Immature Grans (Abs): 0 10*3/uL (ref 0.0–0.1)
Immature Granulocytes: 0 %
Lymphocytes Absolute: 1.3 10*3/uL (ref 0.7–3.1)
Lymphs: 25 %
MCH: 32.6 pg (ref 26.6–33.0)
MCHC: 32.7 g/dL (ref 31.5–35.7)
MCV: 100 fL — ABNORMAL HIGH (ref 79–97)
Monocytes Absolute: 0.7 10*3/uL (ref 0.1–0.9)
Monocytes: 13 %
Neutrophils Absolute: 3 10*3/uL (ref 1.4–7.0)
Neutrophils: 56 %
Platelets: 248 10*3/uL (ref 150–450)
RBC: 4.57 x10E6/uL (ref 4.14–5.80)
RDW: 13.3 % (ref 11.6–15.4)
WBC: 5.4 10*3/uL (ref 3.4–10.8)

## 2024-06-11 LAB — TSH: TSH: 5.03 u[IU]/mL — ABNORMAL HIGH (ref 0.450–4.500)

## 2024-06-11 LAB — VITAMIN D 25 HYDROXY (VIT D DEFICIENCY, FRACTURES): Vit D, 25-Hydroxy: 31.3 ng/mL (ref 30.0–100.0)

## 2024-06-12 ENCOUNTER — Ambulatory Visit: Payer: Self-pay

## 2024-06-12 ENCOUNTER — Ambulatory Visit

## 2024-06-12 NOTE — Telephone Encounter (Signed)
 RN called pts. Wife back, she stated Symptoms are slowing down, pt. Is urinating every 2-3 hours, no fever. She would like the provider to review the labs.              Copied from CRM [id number]. Topic: Clinical - Lab/Test Results >> Jun 12, 2024 11:13 AM Tiffany S wrote: Reason for CRM: Patient wife stated his condition has not gotten any better patient wife was calling to results of urinalysis Reason for Disposition . Caller requesting lab results  (Exception: Routine or non-urgent lab result.)  Protocols used: PCP Call - No Triage-A-AH

## 2024-06-14 MED ORDER — CIPROFLOXACIN HCL 500 MG PO TABS: 500.0000 mg | ORAL_TABLET | Freq: Two times a day (BID) | ORAL | 0 refills | Status: AC

## 2024-06-19 ENCOUNTER — Other Ambulatory Visit

## 2024-06-19 DIAGNOSIS — E875 Hyperkalemia: Secondary | ICD-10-CM

## 2024-06-20 LAB — BASIC METABOLIC PANEL WITH GFR
BUN/Creatinine Ratio: 14 (ref 10–24)
BUN: 31 mg/dL (ref 10–36)
CO2: 20 mmol/L (ref 20–29)
Calcium: 12.9 mg/dL — ABNORMAL HIGH (ref 8.6–10.2)
Chloride: 104 mmol/L (ref 96–106)
Creatinine, Ser: 2.27 mg/dL — ABNORMAL HIGH (ref 0.76–1.27)
Glucose: 93 mg/dL (ref 70–99)
Potassium: 4.8 mmol/L (ref 3.5–5.2)
Sodium: 139 mmol/L (ref 134–144)
eGFR: 27 mL/min/{1.73_m2} — ABNORMAL LOW (ref >59)

## 2024-06-22 ENCOUNTER — Ambulatory Visit: Payer: Self-pay | Admitting: Family Medicine

## 2024-06-23 ENCOUNTER — Ambulatory Visit: Admitting: Family Medicine

## 2024-06-23 ENCOUNTER — Encounter: Payer: Self-pay | Admitting: Family Medicine

## 2024-06-23 VITALS — BP 135/75 | HR 64 | Ht 67.0 in | Wt 162.0 lb

## 2024-06-23 DIAGNOSIS — Z87440 Personal history of urinary (tract) infections: Secondary | ICD-10-CM

## 2024-06-23 DIAGNOSIS — D6869 Other thrombophilia: Secondary | ICD-10-CM

## 2024-06-23 DIAGNOSIS — N184 Chronic kidney disease, stage 4 (severe): Secondary | ICD-10-CM

## 2024-06-23 DIAGNOSIS — I82512 Chronic embolism and thrombosis of left femoral vein: Secondary | ICD-10-CM

## 2024-06-23 DIAGNOSIS — E2089 Other specified hypoparathyroidism: Secondary | ICD-10-CM

## 2024-06-23 DIAGNOSIS — R410 Disorientation, unspecified: Secondary | ICD-10-CM

## 2024-06-23 DIAGNOSIS — F03A0 Unspecified dementia, mild, without behavioral disturbance, psychotic disturbance, mood disturbance, and anxiety: Secondary | ICD-10-CM

## 2024-06-23 DIAGNOSIS — D692 Other nonthrombocytopenic purpura: Secondary | ICD-10-CM

## 2024-06-23 DIAGNOSIS — U071 COVID-19: Secondary | ICD-10-CM

## 2024-06-23 DIAGNOSIS — I129 Hypertensive chronic kidney disease with stage 1 through stage 4 chronic kidney disease, or unspecified chronic kidney disease: Secondary | ICD-10-CM

## 2024-06-23 DIAGNOSIS — N179 Acute kidney failure, unspecified: Secondary | ICD-10-CM

## 2024-06-23 DIAGNOSIS — N401 Enlarged prostate with lower urinary tract symptoms: Secondary | ICD-10-CM | POA: Diagnosis not present

## 2024-06-23 DIAGNOSIS — R0600 Dyspnea, unspecified: Secondary | ICD-10-CM

## 2024-06-23 LAB — MICROSCOPIC EXAMINATION

## 2024-06-23 LAB — URINALYSIS, ROUTINE W REFLEX MICROSCOPIC
Bilirubin, UA: NEGATIVE
Glucose, UA: NEGATIVE
Ketones, UA: NEGATIVE
Leukocytes,UA: NEGATIVE
Nitrite, UA: NEGATIVE
Specific Gravity, UA: >1.03 — ABNORMAL HIGH (ref 1.005–1.030)
Urobilinogen, Ur: 0.2 mg/dL (ref 0.2–1.0)
pH, UA: 5.5 (ref 5.0–7.5)

## 2024-06-23 NOTE — Progress Notes (Signed)
 BP 135/75 (BP Location: Left Arm, Patient Position: Sitting, Cuff Size: Normal)   Pulse 64   Ht 5' 7 (1.702 m)   Wt 162 lb (73.5 kg)   SpO2 93%   BMI 25.37 kg/m    Subjective:    Patient ID: Gregg Thomas, male    DOB: Sep 03, 1931, 89 y.o.   MRN: [id number]  HPI: Gregg Thomas is a 89 y.o. male  Chief Complaint  Patient presents with   Fatigue   Perked back up after he started on the medicine for his UTI. Less confused. Feeling more like himself. Due to see neurology next week. Still feeling very tired and having weakness in his legs  BPH BPH status: uncontrolled Satisfied with current treatment?: no Medication side effects: N/A Duration: months Nocturia: 5+ times per night Urinary frequency:yes Incomplete voiding: yes Urgency: no Weak urinary stream: no Straining to start stream: no Dysuria: no Onset: sudden Severity: severe  Relevant past medical, surgical, family and social history reviewed and updated as indicated. Interim medical history since our last visit reviewed. Allergies and medications reviewed and updated.  Review of Systems  Constitutional:  Positive for fatigue. Negative for activity change, appetite change, chills, diaphoresis, fever and unexpected weight change.  Respiratory: Negative.    Cardiovascular: Negative.   Musculoskeletal: Negative.   Neurological:  Positive for weakness. Negative for dizziness, tremors, seizures, syncope, facial asymmetry, speech difficulty, light-headedness, numbness and headaches.  Psychiatric/Behavioral: Negative.      Per HPI unless specifically indicated above     Objective:    BP 135/75 (BP Location: Left Arm, Patient Position: Sitting, Cuff Size: Normal)   Pulse 64   Ht 5' 7 (1.702 m)   Wt 162 lb (73.5 kg)   SpO2 93%   BMI 25.37 kg/m   Wt Readings from Last 3 Encounters:  06/23/24 162 lb (73.5 kg)  06/10/24 165 lb (74.8 kg)  05/12/24 168 lb 12.8 oz (76.6 kg)    Physical  Exam Vitals and nursing note reviewed.  Constitutional:      General: He is not in acute distress.    Appearance: Normal appearance. He is not ill-appearing, toxic-appearing or diaphoretic.  HENT:     Head: Normocephalic and atraumatic.     Right Ear: External ear normal.     Left Ear: External ear normal.     Nose: Nose normal.     Mouth/Throat:     Mouth: Mucous membranes are moist.     Pharynx: Oropharynx is clear.   Eyes:     General: No scleral icterus.       Right eye: No discharge.        Left eye: No discharge.     Extraocular Movements: Extraocular movements intact.     Conjunctiva/sclera: Conjunctivae normal.     Pupils: Pupils are equal, round, and reactive to light.    Cardiovascular:     Rate and Rhythm: Normal rate and regular rhythm.     Pulses: Normal pulses.     Heart sounds: Normal heart sounds. No murmur heard.    No friction rub. No gallop.  Pulmonary:     Effort: Pulmonary effort is normal. No respiratory distress.     Breath sounds: Normal breath sounds. No stridor. No wheezing, rhonchi or rales.  Chest:     Chest wall: No tenderness.   Musculoskeletal:        General: Normal range of motion.     Cervical back: Normal range of motion  and neck supple.   Skin:    General: Skin is warm and dry.     Capillary Refill: Capillary refill takes less than 2 seconds.     Coloration: Skin is not jaundiced or pale.     Findings: No bruising, erythema, lesion or rash.   Neurological:     General: No focal deficit present.     Mental Status: He is alert and oriented to person, place, and time. Mental status is at baseline.   Psychiatric:        Mood and Affect: Mood normal.        Behavior: Behavior normal.        Thought Content: Thought content normal.        Judgment: Judgment normal.     Results for orders placed or performed in visit on 06/19/24  Basic metabolic panel with GFR   Collection Time: 06/19/24  4:10 PM  Result Value Ref Range    Glucose 93 70 - 99 mg/dL   BUN 31 10 - 36 mg/dL   Creatinine, Ser 7.72 (H) 0.76 - 1.27 mg/dL   eGFR 27 (L) >40 fO/fpw/8.26   BUN/Creatinine Ratio 14 10 - 24   Sodium 139 134 - 144 mmol/L   Potassium 4.8 3.5 - 5.2 mmol/L   Chloride 104 96 - 106 mmol/L   CO2 20 20 - 29 mmol/L   Calcium 12.9 (H) 8.6 - 10.2 mg/dL      Assessment & Plan:   Problem List Items Addressed This Visit       Cardiovascular and Mediastinum   Chronic deep vein thrombosis (DVT) of femoral vein of left lower extremity (HCC)   Relevant Orders   AMB Referral [facility] Care Management   Senile purpura (HCC)   Relevant Orders   AMB Referral [facility] Care Management     Respiratory   Dyspnea due to COVID-19   Relevant Orders   AMB Referral [facility] Care Management     Endocrine   Secondary hypoparathyroidism (HCC)   Relevant Orders   AMB Referral [facility] Care Management     Nervous and Auditory   Dementia (HCC)   Relevant Orders   AMB Referral [facility] Care Management   Confusion   Doing significantly better. Likely due to UTI call with any concerns. Continue to monitor.       Relevant Orders   AMB Referral [facility] Care Management     Genitourinary   CKD (chronic kidney disease) stage 4, GFR 15-29 ml/min (HCC)   Relevant Orders   AMB Referral [facility] Care Management   Benign hypertensive renal disease   Relevant Orders   AMB Referral [facility] Care Management     Hematopoietic and Hemostatic   Acquired thrombophilia (HCC)   Relevant Orders   AMB Referral [facility] Care Management     Other   Benign prostatic hyperplasia with nocturia   Will check urine and culture- if normal will start flomax and recheck in 3-4 weeks.       Other Visit Diagnoses       AKI (acute kidney injury) (HCC)    -  Primary   Rechecking labs today. Await results. Treat as needed.   Relevant Orders   Basic metabolic panel with GFR     History of UTI       Rechecking UA and culture today. Await results. Treat as needed.   Relevant Orders    Urinalysis, Routine w reflex microscopic   Urine Culture  Follow up plan: Return in about 3 weeks (around 07/14/2024).  >25 minutes spent with patient today

## 2024-06-23 NOTE — Assessment & Plan Note (Signed)
 Will check urine and culture- if normal will start flomax and recheck in 3-4 weeks.

## 2024-06-23 NOTE — Assessment & Plan Note (Signed)
 Doing significantly better. Likely due to UTI call with any concerns. Continue to monitor.

## 2024-06-24 ENCOUNTER — Telehealth: Payer: Self-pay

## 2024-06-24 LAB — BASIC METABOLIC PANEL WITH GFR
BUN/Creatinine Ratio: 13 (ref 10–24)
BUN: 29 mg/dL (ref 10–36)
CO2: 21 mmol/L (ref 20–29)
Calcium: 12.2 mg/dL — ABNORMAL HIGH (ref 8.6–10.2)
Chloride: 106 mmol/L (ref 96–106)
Creatinine, Ser: 2.15 mg/dL — ABNORMAL HIGH (ref 0.76–1.27)
Glucose: 89 mg/dL (ref 70–99)
Potassium: 4.7 mmol/L (ref 3.5–5.2)
Sodium: 140 mmol/L (ref 134–144)
eGFR: 29 mL/min/{1.73_m2} — ABNORMAL LOW (ref >59)

## 2024-06-24 NOTE — Progress Notes (Signed)
 Complex Care Management Note  Care Guide Note 06/24/2024 Name: Gregg Thomas MRN: [id number] DOB: April 15, 1931  Gregg Thomas is a 89 y.o. year old male who sees [name], DO for primary care. I reached out to Lynwood Raguel Fabian by phone today to offer complex care management services.  Mr. Vetsch was given information about Complex Care Management services today including:   The Complex Care Management services include support from the care team which includes your Nurse Care Manager, Clinical Social Worker, or Pharmacist.  The Complex Care Management team is here to help remove barriers to the health concerns and goals most important to you. Complex Care Management services are voluntary, and the patient may decline or stop services at any time by request to their care team member.   Complex Care Management Consent Status: Patient agreed to services and verbal consent obtained.   Follow up plan:  Telephone appointment with complex care management team member scheduled for:  LCSW  06/29/2024 RNCM 07/06/2024  Encounter Outcome:  Patient Scheduled  Jeoffrey Buffalo , RMA     [facility]  [facility], [facility] Guide  Direct Dial: [phone]  Website: [email or web address]

## 2024-06-25 ENCOUNTER — Ambulatory Visit: Payer: Self-pay | Admitting: Family Medicine

## 2024-06-25 LAB — URINE CULTURE: Organism ID, Bacteria: NO GROWTH

## 2024-06-25 NOTE — Progress Notes (Signed)
 Spoke with Patient's wife. She is going to reach out to Dr. Marcelino to make an appointment.

## 2024-06-26 ENCOUNTER — Inpatient Hospital Stay: Attending: Internal Medicine

## 2024-06-29 ENCOUNTER — Observation Stay
Admission: EM | Admit: 2024-06-29 | Discharge: 2024-07-02 | Disposition: A | Discharge status: Home Health | Source: Ambulatory Visit | Attending: Student in an Organized Health Care Education/Training Program | Admitting: Student in an Organized Health Care Education/Training Program

## 2024-06-29 ENCOUNTER — Telehealth: Admitting: Licensed Clinical Social Worker

## 2024-06-29 ENCOUNTER — Other Ambulatory Visit: Payer: Self-pay

## 2024-06-29 ENCOUNTER — Other Ambulatory Visit: Payer: Self-pay | Admitting: Internal Medicine

## 2024-06-29 ENCOUNTER — Emergency Department

## 2024-06-29 ENCOUNTER — Encounter: Payer: Self-pay | Admitting: *Deleted

## 2024-06-29 DIAGNOSIS — Z79899 Other long term (current) drug therapy: Secondary | ICD-10-CM | POA: Insufficient documentation

## 2024-06-29 DIAGNOSIS — Z7901 Long term (current) use of anticoagulants: Secondary | ICD-10-CM

## 2024-06-29 DIAGNOSIS — N401 Enlarged prostate with lower urinary tract symptoms: Secondary | ICD-10-CM | POA: Insufficient documentation

## 2024-06-29 DIAGNOSIS — Z85828 Personal history of other malignant neoplasm of skin: Secondary | ICD-10-CM | POA: Diagnosis not present

## 2024-06-29 DIAGNOSIS — R911 Solitary pulmonary nodule: Secondary | ICD-10-CM | POA: Insufficient documentation

## 2024-06-29 DIAGNOSIS — E663 Overweight: Secondary | ICD-10-CM | POA: Diagnosis not present

## 2024-06-29 DIAGNOSIS — I10 Essential (primary) hypertension: Secondary | ICD-10-CM | POA: Diagnosis not present

## 2024-06-29 DIAGNOSIS — N189 Chronic kidney disease, unspecified: Secondary | ICD-10-CM | POA: Diagnosis not present

## 2024-06-29 DIAGNOSIS — Z8616 Personal history of COVID-19: Secondary | ICD-10-CM

## 2024-06-29 DIAGNOSIS — N184 Chronic kidney disease, stage 4 (severe): Secondary | ICD-10-CM | POA: Diagnosis not present

## 2024-06-29 DIAGNOSIS — R29898 Other symptoms and signs involving the musculoskeletal system: Secondary | ICD-10-CM

## 2024-06-29 DIAGNOSIS — I82512 Chronic embolism and thrombosis of left femoral vein: Secondary | ICD-10-CM | POA: Insufficient documentation

## 2024-06-29 DIAGNOSIS — R2689 Other abnormalities of gait and mobility: Secondary | ICD-10-CM | POA: Diagnosis present

## 2024-06-29 DIAGNOSIS — R351 Nocturia: Secondary | ICD-10-CM

## 2024-06-29 DIAGNOSIS — G9331 Postviral fatigue syndrome: Secondary | ICD-10-CM

## 2024-06-29 DIAGNOSIS — F0390 Unspecified dementia without behavioral disturbance: Secondary | ICD-10-CM | POA: Diagnosis not present

## 2024-06-29 DIAGNOSIS — Z6825 Body mass index (BMI) 25.0-25.9, adult: Secondary | ICD-10-CM | POA: Diagnosis not present

## 2024-06-29 DIAGNOSIS — E538 Deficiency of other specified B group vitamins: Secondary | ICD-10-CM | POA: Diagnosis not present

## 2024-06-29 DIAGNOSIS — Z7951 Long term (current) use of inhaled steroids: Secondary | ICD-10-CM

## 2024-06-29 DIAGNOSIS — I129 Hypertensive chronic kidney disease with stage 1 through stage 4 chronic kidney disease, or unspecified chronic kidney disease: Secondary | ICD-10-CM | POA: Diagnosis not present

## 2024-06-29 DIAGNOSIS — R799 Abnormal finding of blood chemistry, unspecified: Secondary | ICD-10-CM | POA: Diagnosis present

## 2024-06-29 LAB — BASIC METABOLIC PANEL WITH GFR
Anion gap: 7 (ref 5–15)
BUN: 31 mg/dL — ABNORMAL HIGH (ref 8–23)
CO2: 24 mmol/L (ref 22–32)
Calcium: 12.1 mg/dL — ABNORMAL HIGH (ref 8.9–10.3)
Chloride: 109 mmol/L (ref 98–111)
Creatinine, Ser: 2.31 mg/dL — ABNORMAL HIGH (ref 0.61–1.24)
GFR, Estimated: 26 mL/min — ABNORMAL LOW (ref >60)
Glucose, Bld: 102 mg/dL — ABNORMAL HIGH (ref 70–99)
Potassium: 5 mmol/L (ref 3.5–5.1)
Sodium: 140 mmol/L (ref 135–145)

## 2024-06-29 LAB — CBC
HCT: 45.4 % (ref 39.0–52.0)
Hemoglobin: 15.3 g/dL (ref 13.0–17.0)
MCH: 33.4 pg (ref 26.0–34.0)
MCHC: 33.7 g/dL (ref 30.0–36.0)
MCV: 99.1 fL (ref 80.0–100.0)
Platelets: 213 10*3/uL (ref 150–400)
RBC: 4.58 MIL/uL (ref 4.22–5.81)
RDW: 13.5 % (ref 11.5–15.5)
WBC: 6.3 10*3/uL (ref 4.0–10.5)
nRBC: 0 % (ref 0.0–0.2)

## 2024-06-29 LAB — URINALYSIS, COMPLETE (UACMP) WITH MICROSCOPIC
Bilirubin Urine: NEGATIVE
Glucose, UA: NEGATIVE mg/dL
Hgb urine dipstick: NEGATIVE
Ketones, ur: NEGATIVE mg/dL
Leukocytes,Ua: NEGATIVE
Nitrite: NEGATIVE
Protein, ur: 100 mg/dL — AB
Specific Gravity, Urine: 1.017 (ref 1.005–1.030)
pH: 5 (ref 5.0–8.0)

## 2024-06-29 LAB — PROTIME-INR
INR: 2 — ABNORMAL HIGH (ref 0.8–1.2)
Prothrombin Time: 23.2 s — ABNORMAL HIGH (ref 11.4–15.2)

## 2024-06-29 LAB — PSA: Prostatic Specific Antigen: 2.01 ng/mL (ref 0.00–4.00)

## 2024-06-29 LAB — PHOSPHORUS: Phosphorus: 2.8 mg/dL (ref 2.5–4.6)

## 2024-06-29 LAB — MAGNESIUM: Magnesium: 2.3 mg/dL (ref 1.7–2.4)

## 2024-06-29 LAB — TSH: TSH: 2.273 u[IU]/mL (ref 0.350–4.500)

## 2024-06-29 MED ORDER — SODIUM CHLORIDE 0.9 % IV SOLN: Freq: Once | INTRAVENOUS | Status: AC

## 2024-06-29 MED ORDER — DONEPEZIL HCL 5 MG PO TABS
10.0000 mg | ORAL_TABLET | Freq: Every day | ORAL | Status: DC
  Administered 2024-06-30 – 2024-07-01 (×3): 10 mg via ORAL
  Filled 2024-06-29 (×3): qty 2

## 2024-06-29 MED ORDER — TAMSULOSIN HCL 0.4 MG PO CAPS
0.4000 mg | ORAL_CAPSULE | Freq: Every day | ORAL | Status: DC
  Administered 2024-06-29 – 2024-07-01 (×3): 0.4 mg via ORAL
  Filled 2024-06-29 (×3): qty 1

## 2024-06-29 MED ORDER — LORATADINE 10 MG PO TABS
10.0000 mg | ORAL_TABLET | Freq: Every day | ORAL | Status: DC
  Administered 2024-06-30 – 2024-07-02 (×3): 10 mg via ORAL
  Filled 2024-06-29 (×3): qty 1

## 2024-06-29 MED ORDER — ALBUTEROL SULFATE (2.5 MG/3ML) 0.083% IN NEBU: 2.5000 mg | INHALATION_SOLUTION | RESPIRATORY_TRACT | Status: DC | PRN

## 2024-06-29 MED ORDER — SODIUM CHLORIDE 0.9 % IV SOLN
90.0000 mg | Freq: Once | INTRAVENOUS | Status: AC
  Administered 2024-06-29: 90 mg via INTRAVENOUS
  Filled 2024-06-29: qty 30

## 2024-06-29 MED ORDER — WARFARIN - PHARMACIST DOSING INPATIENT: Freq: Every day | Status: DC

## 2024-06-29 MED ORDER — HEPARIN SODIUM (PORCINE) 5000 UNIT/ML IJ SOLN: 5000.0000 [IU] | Freq: Three times a day (TID) | INTRAMUSCULAR | Status: DC

## 2024-06-29 MED ORDER — ONDANSETRON HCL 4 MG/2ML IJ SOLN: 4.0000 mg | Freq: Three times a day (TID) | INTRAMUSCULAR | Status: DC | PRN

## 2024-06-29 MED ORDER — GRAPE SEED EXTRACT 50 MG PO CAPS: 1300.0000 mg | ORAL_CAPSULE | Freq: Every day | ORAL | Status: DC

## 2024-06-29 MED ORDER — HYDRALAZINE HCL 20 MG/ML IJ SOLN: 5.0000 mg | INTRAMUSCULAR | Status: DC | PRN

## 2024-06-29 MED ORDER — ACETAMINOPHEN 325 MG PO TABS: 650.0000 mg | ORAL_TABLET | Freq: Four times a day (QID) | ORAL | Status: DC | PRN

## 2024-06-29 MED ORDER — WARFARIN SODIUM 5 MG PO TABS
5.0000 mg | ORAL_TABLET | Freq: Once | ORAL | Status: AC
  Administered 2024-06-29: 5 mg via ORAL
  Filled 2024-06-29: qty 1

## 2024-06-29 MED ORDER — DM-GUAIFENESIN ER 30-600 MG PO TB12: 1.0000 | ORAL_TABLET | Freq: Two times a day (BID) | ORAL | Status: DC | PRN

## 2024-06-29 MED ORDER — VITAMIN B-12 1000 MCG PO TABS
1000.0000 ug | ORAL_TABLET | Freq: Every day | ORAL | Status: DC
  Administered 2024-06-30 – 2024-07-02 (×3): 1000 ug via ORAL
  Filled 2024-06-29 (×3): qty 1

## 2024-06-29 MED ORDER — GABAPENTIN 100 MG PO CAPS
200.0000 mg | ORAL_CAPSULE | Freq: Every day | ORAL | Status: DC
  Administered 2024-06-30 – 2024-07-01 (×3): 200 mg via ORAL
  Filled 2024-06-29 (×3): qty 2

## 2024-06-29 MED ORDER — SODIUM CHLORIDE 0.9 % IV SOLN: INTRAVENOUS | Status: AC

## 2024-06-29 MED ORDER — AMLODIPINE BESYLATE 5 MG PO TABS
2.5000 mg | ORAL_TABLET | Freq: Every day | ORAL | Status: DC
  Administered 2024-06-30 – 2024-07-02 (×3): 2.5 mg via ORAL
  Filled 2024-06-29 (×3): qty 1

## 2024-06-29 MED ORDER — VITAMIN C 500 MG PO TABS
1000.0000 mg | ORAL_TABLET | Freq: Every day | ORAL | Status: DC
  Administered 2024-06-30 – 2024-07-02 (×3): 1000 mg via ORAL
  Filled 2024-06-29 (×3): qty 2

## 2024-06-29 MED ORDER — ELDERBERRY 500 MG PO CAPS: ORAL_CAPSULE | Freq: Every day | ORAL | Status: DC

## 2024-06-29 NOTE — H&P (Signed)
 History and Physical    Gregg Thomas FMW:[id number] DOB: 1931-10-25 DOA: 06/29/2024  Referring MD/NP/PA:   PCP: [name], DO   Patient coming from:  The patient is coming from home.     Chief Complaint: Fatigue, abnormal lab with hypercalcemia  HPI: Gregg Thomas is a 89 y.o. male with medical history significant of CKD stage IV, hypertension, dementia, BPH, DVT on Coumadin , skin cancer, hard of hearing, who presents with fatigue, abnormal lab with hypercalcemia.  Patient has history of stage IV CKD, following up with Dr. Marcelino of nephrology.  He did the lab on 6/24 which showed hypercalcemia with calcium 12.2.  Patient was sent to ED for further evaluation and treatment. Per EDP who spoke to Dr. Marcelino of nephrology, Dr. Marcelino is concerned for underlying undiagnosed malignancy leading to hypercalcemia.  Per patient and his wife, patient has been feeling generalized weak and fatigued in the past month.  No unilateral numbness or tingling in extremities.  No facial droop or slurred speech.  Patient denies chest pain, cough, SOB.  No nausea, vomiting, diarrhea or abdominal pain.  She has increased urinary frequency, particularly in the night.  Denies dysuria or hematuria.  Data reviewed independently and ED Course: pt was found to have calcium 12.1, WBC 6.3, renal function close to baseline, negative UA, INR 2.0, temperature 97.5, blood pressure 158/68, heart rate 65 --> 57, RR 20, oxygen saturation 98% on room air.  Patient is placed in telemetry bed for observation.  CT scan of abdomen/pelvis/chest:  CT of the chest: Basilar atelectatic changes.   Findings of prior granulomatous disease.   5 mm nodule in the lingula stable from 2013. No follow-up is recommended given its long-term stability.   CT of the abdomen and pelvis: Diverticulosis without diverticulitis.   Simple renal cyst on the right.  No follow-up is recommended.    EKG: Not done in ED, will  get one.      Review of Systems:   General: no fevers, chills, no body weight gain, has fatigue HEENT: no blurry vision or sore throat Respiratory: no dyspnea, coughing, wheezing CV: no chest pain, no palpitations GI: no nausea, vomiting, abdominal pain, diarrhea, constipation GU: no dysuria, burning on urination, has  increased urinary frequency, no hematuria  Ext: no leg edema Neuro: no unilateral weakness, numbness, or tingling, no vision change or hearing loss Skin: no rash, no skin tear. MSK: No muscle spasm, no deformity, no limitation of range of movement in spin Heme: No easy bruising.  Travel history: No recent long distant travel.   Allergy: No Known Allergies  Past Medical History:  Diagnosis Date   Chronic kidney disease    Clotting disorder (HCC)    Dementia (HCC)    Heart murmur    Skin cancer     Past Surgical History:  Procedure Laterality Date   biopsy Right 08/26/2019   temple biopsy - dr.dasher    EYE SURGERY Right    benign growth on right eye    KNEE SURGERY Right    MELANOMA EXCISION Bilateral    lower back     Social History:  reports that he has never smoked. He has never used smokeless tobacco. He reports that he does not drink alcohol and does not use drugs.  Family History:  Family History  Problem Relation Age of Onset   Stroke Mother    Cerebral aneurysm Father      Prior to Admission medications   Medication  Sig Start Date End Date Taking? Authorizing Provider  albuterol  (VENTOLIN  HFA) 108 (90 Base) MCG/ACT inhaler Inhale 2 puffs into the lungs every 6 (six) hours as needed for wheezing or shortness of breath. 05/12/24   Johnson, Megan P, DO  amLODipine  (NORVASC ) 2.5 MG tablet Take 1 tablet (2.5 mg total) by mouth daily. 05/12/24   Johnson, Megan P, DO  ascorbic acid  (VITAMIN C ) 1000 MG tablet Take by mouth.    [provider]  cetirizine  (ZYRTEC ) 5 MG tablet Take 1 tablet (5 mg total) by mouth daily. 05/12/24   Johnson, Megan  P, DO  Cholecalciferol (VITAMIN D3 PO) Take 1 tablet by mouth daily.    [provider]  Cyanocobalamin  (VITAMIN B-12 ER PO) Take 1,000 mcg by mouth daily at 6 (six) AM.    [provider]  donepezil  (ARICEPT ) 10 MG tablet Take 1 tablet (10 mg total) by mouth at bedtime. 05/12/24   Johnson, Megan P, DO  ELDERBERRY PO Take 1 tablet by mouth daily.    [provider]  fluticasone  (FLONASE ) 50 MCG/ACT nasal spray Place 2 sprays into both nostrils daily. 05/12/24   Johnson, Megan P, DO  gabapentin  (NEURONTIN ) 100 MG capsule Take 2 capsules (200 mg total) by mouth at bedtime. 05/12/24   Johnson, Megan P, DO  Grape Seed Extract 50 MG CAPS Take 1,300 mg by mouth daily.     [provider]  Ipratropium-Albuterol  (COMBIVENT ) 20-100 MCG/ACT AERS respimat Inhale 1 puff into the lungs every 6 (six) hours.    [provider]  montelukast  (SINGULAIR ) 10 MG tablet Take 1 tablet (10 mg total) by mouth at bedtime. 05/12/24   Johnson, Megan P, DO  warfarin (COUMADIN ) 5 MG tablet Take 1 tablet (5 mg total) by mouth one time only at 6 PM. 06/29/24   Brahmanday, Govinda R, MD  zinc  gluconate 50 MG tablet Take by mouth.    [provider]    Physical Exam: Vitals:   06/29/24 1730 06/29/24 1800 06/29/24 1830 06/29/24 2135  BP: (!) 146/70 (!) 145/94 (!) 158/68 (!) 175/73  Pulse: 61 64 (!) 57 (!) 56  Resp:   20 16  Temp:    97.8 F (36.6 C)  TempSrc:    Oral  SpO2: 96% 98% 98% 95%  Weight:      Height:       General: Not in acute distress HEENT:       Eyes: PERRL, EOMI, no jaundice       ENT: No discharge from the ears and nose, no pharynx injection, no tonsillar enlargement.        Neck: No JVD, no bruit, no mass felt. Heme: No neck lymph node enlargement. Cardiac: S1/S2, RRR, No murmurs, No gallops or rubs. Respiratory: No rales, wheezing, rhonchi or rubs. GI: Soft, nondistended, nontender, no rebound pain, no organomegaly, BS present. GU: No  hematuria Ext: No pitting leg edema bilaterally. 1+DP/PT pulse bilaterally. Musculoskeletal: No joint deformities, No joint redness or warmth, no limitation of ROM in spin. Skin: No rashes.  Neuro: Alert, oriented X3, cranial nerves II-XII grossly intact, moves all extremities normally.  Psych: Patient is not psychotic, no suicidal or hemocidal ideation.  Labs on Admission: I have personally reviewed following labs and imaging studies  CBC: Recent Labs  Lab 06/29/24 1657  WBC 6.3  HGB 15.3  HCT 45.4  MCV 99.1  PLT 213   Basic Metabolic Panel: Recent Labs  Lab 06/23/24 1420 06/29/24 1812  NA  140 140  K 4.7 5.0  CL 106 109  CO2 21 24  GLUCOSE 89 102*  BUN 29 31*  CREATININE 2.15* 2.31*  CALCIUM 12.2* 12.1*  MG  --  2.3  PHOS  --  2.8   GFR: Estimated Creatinine Clearance: 19.9 mL/min (A) (by C-G formula based on SCr of 2.31 mg/dL (H)). Liver Function Tests: No results for input(s): AST, ALT, ALKPHOS, BILITOT, PROT, ALBUMIN in the last 168 hours. No results for input(s): LIPASE, AMYLASE in the last 168 hours. No results for input(s): AMMONIA in the last 168 hours. Coagulation Profile: Recent Labs  Lab 06/29/24 2117  INR 2.0*   Cardiac Enzymes: No results for input(s): CKTOTAL, CKMB, CKMBINDEX, TROPONINI in the last 168 hours. BNP (last 3 results) No results for input(s): PROBNP in the last 8760 hours. HbA1C: No results for input(s): HGBA1C in the last 72 hours. CBG: No results for input(s): GLUCAP in the last 168 hours. Lipid Profile: No results for input(s): CHOL, HDL, LDLCALC, TRIG, CHOLHDL, LDLDIRECT in the last 72 hours. Thyroid Function Tests: Recent Labs    06/29/24 1812  TSH 2.273   Anemia Panel: No results for input(s): VITAMINB12, FOLATE, FERRITIN, TIBC, IRON, RETICCTPCT in the last 72 hours. Urine analysis:    Component Value Date/Time   COLORURINE YELLOW (A) 06/29/2024 1707    APPEARANCEUR CLEAR (A) 06/29/2024 1707   APPEARANCEUR Clear 06/23/2024 1415   LABSPEC 1.017 06/29/2024 1707   PHURINE 5.0 06/29/2024 1707   GLUCOSEU NEGATIVE 06/29/2024 1707   HGBUR NEGATIVE 06/29/2024 1707   BILIRUBINUR NEGATIVE 06/29/2024 1707   BILIRUBINUR Negative 06/23/2024 1415   KETONESUR NEGATIVE 06/29/2024 1707   PROTEINUR 100 (A) 06/29/2024 1707   NITRITE NEGATIVE 06/29/2024 1707   LEUKOCYTESUR NEGATIVE 06/29/2024 1707   Sepsis Labs: @LABRCNTIP (procalcitonin:4,lacticidven:4) ) Recent Results (from the past 240 hours)  Urine Culture     Status: None   Collection Time: 06/23/24  2:19 AM   Specimen: Blood   UR  Result Value Ref Range Status   Urine Culture, Routine Final report  Final   Organism ID, Bacteria No growth  Final  Microscopic Examination     Status: None   Collection Time: 06/23/24  2:15 PM   Urine  Result Value Ref Range Status   WBC, UA 0-5 0 - 5 /hpf Final   RBC, Urine 0-2 0 - 2 /hpf Final   Epithelial Cells (non renal) 0-10 0 - 10 /hpf Final   Bacteria, UA Few None seen/Few Final     Radiological Exams on Admission:   Assessment/Plan Principal Problem:   Hypercalcemia Active Problems:   CKD (chronic kidney disease) stage 4, GFR 15-29 ml/min (HCC)   Benign prostatic hyperplasia with nocturia   HTN (hypertension)   Chronic deep vein thrombosis (DVT) of femoral vein of left lower extremity (HCC)   Dementia (HCC)   Overweight (BMI 25.0-29.9)   Assessment and Plan:  Hypercalcemia: Calcium 12.1. CT scan of chest/abdomen/pelvis is negative for suspicious malignancy. It showed a 5mm stable lingular nodule since 2013, less likely to be malignancy.  CT scan showed granulomatosis disease which is likely underlying the etiology. Pt had elevated PTH level 83 on 05/14/2024, and normal vitamin D  25 lever of 31.1. TSH level is normal today at 2.273.  Magnesium 2.3, phosphorus of 2.8.  - Place in tele bed for obs - Check PTH related peptide, vitamin D1,25 -  check urine calcium level - check PSA - give IV pamidronate 90 mg - IV  fluid: Normal saline at 75 cc/h  - Discontinue home vitamin D3  CKD (chronic kidney disease) stage 4, GFR 15-29 ml/min (HCC): Renal function close to baseline.  Recent baseline creatinine 2.15 on 06/23/2024.  His creatinine is at 2.31, BUN 71, GFR 26. -f/u with renal  Benign prostatic hyperplasia with nocturia -Start Flomax 0.4 mg daily  HTN (hypertension) -IV hydralazine  as needed - Amlodipine   Chronic deep vein thrombosis (DVT) of femoral vein of left lower extremity (HCC) -Continue Coumadin  per pharmacy dosing  Dementia [facility]): No behavior disturbance. -Donepezil   Overweight (BMI 25.0-29.9): Body weight 74.8 kg, BMI 25.84 - Encouraged losing weight - Exercise healthy diet        DVT ppx: on Coumadin   Code Status: DNR per pt and his wife  Family Communication:     not done, no family member is at bed side.    Disposition Plan:  Anticipate discharge back to previous environment  Consults called:  none  Admission status and Level of care: Telemetry Medical:    for obs       Dispo: The patient is from: Home              Anticipated d/c is to: Home              Anticipated d/c date is: 1 day              Patient currently is not medically stable to d/c.    Severity of Illness:  The appropriate patient status for this patient is OBSERVATION. Observation status is judged to be reasonable and necessary in order to provide the required intensity of service to ensure the patient's safety. The patient's presenting symptoms, physical exam findings, and initial radiographic and laboratory data in the context of their medical condition is felt to place them at decreased risk for further clinical deterioration. Furthermore, it is anticipated that the patient will be medically stable for discharge from the hospital within 2 midnights of admission.        Date of Service 06/29/2024    Caleb Exon [facility]   If 7PM-7AM, please contact night-coverage [email or web address] 06/29/2024, 10:02 PM

## 2024-06-29 NOTE — ED Notes (Signed)
Patient aware of need of urine sample.

## 2024-06-29 NOTE — ED Provider Notes (Signed)
 [facility] Provider Note    Event Date/Time   First MD Initiated Contact with Patient 06/29/24 1650     (approximate)  History   Chief Complaint: abnormal labs  HPI  Jahid Weida is a 89 y.o. male with a past medical history of CKD, dementia, presents to the emergency department for abnormal lab work.  According to the patient he was seen by his nephrologist Dr. Marcelino on 6/24, was called back today saying that his lab work was abnormal with a high calcium level and was told to go to the emergency department.  Wife states for the past 1 month or so she has noticed generalized weakness and fatigue.  Patient denies any acute symptoms today.  Physical Exam   Triage Vital Signs: ED Triage Vitals [06/29/24 1635]  Encounter Vitals Group     BP 130/75     Girls Systolic BP Percentile      Girls Diastolic BP Percentile      Boys Systolic BP Percentile      Boys Diastolic BP Percentile      Pulse Rate 65     Resp 18     Temp (!) 97.5 F (36.4 C)     Temp Source Oral     SpO2 95 %     Weight 165 lb (74.8 kg)     Height 5' 7 (1.702 m)     Head Circumference      Peak Flow      Pain Score 0     Pain Loc      Pain Education      Exclude from Growth Chart     Most recent vital signs: Vitals:   06/29/24 1635  BP: 130/75  Pulse: 65  Resp: 18  Temp: (!) 97.5 F (36.4 C)  SpO2: 95%    General: Awake, no distress.  CV:  Good peripheral perfusion.   Resp:  Normal effort.  Abd:  No distention.  Soft, nontender ED Results / Procedures / Treatments   MEDICATIONS ORDERED IN ED: Medications - No data to display   IMPRESSION / MDM / ASSESSMENT AND PLAN / ED COURSE  I reviewed the triage vital signs and the nursing notes.  Patient's presentation is most consistent with acute presentation with potential threat to life or bodily function.  Patient presents to the emergency department for abnormal calcium level as well as 1 month of  generalized fatigue/weakness.  I reviewed the patient's labs from 6/24 showing an elevated calcium level of 12.2 which is 2 points above our cutoff for normal.  Patient appears to have been experiencing hypercalcemia since at least 6/11 with similar values at that time of 12.8.  Will recheck a calcium today.  We will IV hydrate.  We will discuss with nephrology once today's lab results are known.  Patient's lab today shows a reassuring CBC.  No concerning urinalysis findings.  Patient's chemistry does show continued hypercalcemia 12.1.  I spoke to Dr. Marcelino of nephrology who is 1 who referred the patient to the emergency department.  He states this hypercalcemia is new x 3 weeks the patient has been complaining of generalized fatigue and weakness.  He is concerned for underlying undiagnosed malignancy leading to hypercalcemia.  Patient has been experiencing urinary frequency.  Will add a PSA, we will proceed with a CT chest abdomen pelvis to look for any malignant findings.  Will begin IV hydration.  Nephrology would like the patient admitted to the  hospital service for further workup and treatment.  We will admit once CT findings are known.  FINAL CLINICAL IMPRESSION(S) / ED DIAGNOSES   Hypercalcemia Weakness  Note:  This document was prepared using Dragon voice recognition software and may include unintentional dictation errors.   Dorothyann Drivers, MD 06/29/24 2308

## 2024-06-29 NOTE — ED Triage Notes (Signed)
 Pt sent to ER for eval of high calcium.  Pt denies any pain.  Pt sent from dr andrey office.  Pt alert  speech clear.

## 2024-06-29 NOTE — Progress Notes (Signed)
 PHARMACY - ANTICOAGULATION CONSULT NOTE  Pharmacy Consult for warfarin Indication: Hx of DVT (2020)   No Known Allergies  Patient Measurements: Height: 5' 7 (170.2 cm) Weight: 74.8 kg (165 lb) IBW/kg (Calculated) : 66.1 HEPARIN  DW (KG): 74.8  Vital Signs: Temp: 97.8 F (36.6 C) (06/30 2135) Temp Source: Oral (06/30 2135) BP: 175/73 (06/30 2135) Pulse Rate: 56 (06/30 2135)  Labs: Recent Labs    06/29/24 1657 06/29/24 1812 06/29/24 2117  HGB 15.3  --   --   HCT 45.4  --   --   PLT 213  --   --   LABPROT  --   --  23.2*  INR  --   --  2.0*  CREATININE  --  2.31*  --     Estimated Creatinine Clearance: 19.9 mL/min (A) (by C-G formula based on SCr of 2.31 mg/dL (H)).   Medical History: Past Medical History:  Diagnosis Date   Chronic kidney disease    Clotting disorder (HCC)    Dementia (HCC)    Heart murmur    Skin cancer     Medications:  Scheduled:   tamsulosin  0.4 mg Oral QPC supper   warfarin  5 mg Oral Once   [START ON 06/30/2024] Warfarin - Pharmacist Dosing Inpatient   Does not apply q1600   Assessment: 89 y/o male presenting with hypercalcemia. PMH significant for CKD stage IV, hypertension, dementia, BPH, DVT on Coumadin , skin cancer. Pharmacy has been consulted to resume and mange warfarin.    Warfarin regimen: 5 mg daily (total weekly dose of 35 mg) Last warfarin dose prior to admission: 5 mg on 06/28/24 @ 1800  Baseline labs: INR 2.0, hgb 15.3, plt 213  Date INR Warfarin Dose 06/30 2.0   Goal of Therapy:  INR 2-3 Monitor platelets by anticoagulation protocol: Yes   Plan:  Give warfarin 5 mg x1 tonight Monitor daily INR while inpatient Monitor CBC at least weekly  Thank you for involving pharmacy in this patient's care.   Damien Napoleon, PharmD Clinical Pharmacist 06/29/2024 10:22 PM

## 2024-06-30 DIAGNOSIS — N401 Enlarged prostate with lower urinary tract symptoms: Secondary | ICD-10-CM | POA: Diagnosis not present

## 2024-06-30 DIAGNOSIS — I10 Essential (primary) hypertension: Secondary | ICD-10-CM | POA: Diagnosis not present

## 2024-06-30 DIAGNOSIS — F03A0 Unspecified dementia, mild, without behavioral disturbance, psychotic disturbance, mood disturbance, and anxiety: Secondary | ICD-10-CM

## 2024-06-30 DIAGNOSIS — N184 Chronic kidney disease, stage 4 (severe): Secondary | ICD-10-CM | POA: Diagnosis not present

## 2024-06-30 LAB — CBC
HCT: 40.1 % (ref 39.0–52.0)
Hemoglobin: 13.5 g/dL (ref 13.0–17.0)
MCH: 33.1 pg (ref 26.0–34.0)
MCHC: 33.7 g/dL (ref 30.0–36.0)
MCV: 98.3 fL (ref 80.0–100.0)
Platelets: 186 10*3/uL (ref 150–400)
RBC: 4.08 MIL/uL — ABNORMAL LOW (ref 4.22–5.81)
RDW: 13.2 % (ref 11.5–15.5)
WBC: 4.6 10*3/uL (ref 4.0–10.5)
nRBC: 0 % (ref 0.0–0.2)

## 2024-06-30 LAB — BASIC METABOLIC PANEL WITH GFR
Anion gap: 7 (ref 5–15)
BUN: 29 mg/dL — ABNORMAL HIGH (ref 8–23)
CO2: 23 mmol/L (ref 22–32)
Calcium: 11.1 mg/dL — ABNORMAL HIGH (ref 8.9–10.3)
Chloride: 110 mmol/L (ref 98–111)
Creatinine, Ser: 1.86 mg/dL — ABNORMAL HIGH (ref 0.61–1.24)
GFR, Estimated: 34 mL/min — ABNORMAL LOW (ref >60)
Glucose, Bld: 91 mg/dL (ref 70–99)
Potassium: 3.9 mmol/L (ref 3.5–5.1)
Sodium: 140 mmol/L (ref 135–145)

## 2024-06-30 LAB — PROTIME-INR
INR: 2.1 — ABNORMAL HIGH (ref 0.8–1.2)
Prothrombin Time: 24.6 s — ABNORMAL HIGH (ref 11.4–15.2)

## 2024-06-30 MED ORDER — WARFARIN SODIUM 5 MG PO TABS
5.0000 mg | ORAL_TABLET | Freq: Once | ORAL | Status: AC
  Administered 2024-06-30: 5 mg via ORAL
  Filled 2024-06-30: qty 1

## 2024-06-30 NOTE — Progress Notes (Signed)
 PROGRESS NOTE    Gregg Thomas  FMW:[id number] DOB: 1931-11-09 DOA: 06/29/2024 PCP: [name], DO  Chief Complaint  Patient presents with   abnormal labs    Hospital Course:  Gregg Thomas is a 89 year old male with CKD stage IV, hypertension, dementia, BPH, DVT on Coumadin , skin cancer, hard of hearing, who presents with fatigue, gait imbalance, and abnormal labs with hypercalcemia.  Patient has been following with Dr. Marcelino of nephrology who had lab work performed on 6/24 which revealed calcium of 12.2.  It was recommended he present to the ED for further evaluation.  There was concern for undiagnosed malignancy. Upon further evaluation his wife reports that the patient has been gradually declining over the last few weeks.  She reports previously he was steady on his feet but recently began walking with a cane and then a walker.  He has been endorsing pain in both legs and has had increasing confusion.  He has an appointment with neurology [facility] clinic for workup regarding this. CT abdomen pelvis reveals prior granulomatous disease and stable nodule.  CT abdomen pelvis without acute findings.  Subjective: No acute complaints this morning.  No events overnight.  His wife is at bedside.  Presently the patient is pleasantly confused which his wife reports is near baseline.   Objective: Vitals:   06/29/24 2135 06/29/24 2228 06/30/24 0458 06/30/24 0810  BP: (!) 175/73 (!) 173/85 (!) 144/63 130/61  Pulse: (!) 56 63 63 60  Resp: 16 18 18 18   Temp: 97.8 F (36.6 C) 97.8 F (36.6 C) (!) 97.5 F (36.4 C) 98 F (36.7 C)  TempSrc: Oral     SpO2: 95% 95% 91% 91%  Weight:      Height:       No intake or output data in the 24 hours ending 06/30/24 1229 Filed Weights   06/29/24 1635  Weight: 74.8 kg    Examination: General exam: Appears calm and comfortable, NAD  Respiratory system: No work of breathing, symmetric chest wall expansion Cardiovascular  system: S1 & S2 heard, RRR.  Gastrointestinal system: Abdomen is nondistended, soft and nontender.  Neuro: Alert, disoriented.  Oriented to self and wife.  Disoriented to place, year, president, situation Extremities: Symmetric, expected ROM Skin: No rashes, lesions Psychiatry: mood and affect congruent  Assessment & Plan:  Principal Problem:   Hypercalcemia Active Problems:   CKD (chronic kidney disease) stage 4, GFR 15-29 ml/min (HCC)   Benign prostatic hyperplasia with nocturia   HTN (hypertension)   Chronic deep vein thrombosis (DVT) of femoral vein of left lower extremity (HCC)   Dementia (HCC)   Overweight (BMI 25.0-29.9)    Hypercalcemia - Calcium 12.1 on arrival - Status post pamidronate 90 mg x 1 6/30 - Calcium downtrending now -- PTH pending - Continue to monitor closely - Further correction per nephrology - No evidence of malignancy on scans.  Does have granulomatous disease which may be playing a role  CKD IV  -Creatinine appears to be at baseline - Nephrology consulted as above - Continue to renally dose medications with a creatinine clearance of 24  BPH with nocturia - Resume home meds  HTN - Resume home meds - Monitor blood pressure closely.  Titrate as needed  Chronic DVT of femoral vein of left lower extremity - On Coumadin , pharmacy consulted to assist in management  Dementia -At baseline walks with a cane, has had gradual decline over the last few weeks.  His wife reports his mental  status varies.  Presently he is oriented only to self and wife, disoriented to year, situation, president  Deconditioned -- Currently deconditioned beyond baseline.  Has had gradual decline over the last 1 month.  Perhaps related to hypercalcemia --PT/OT evals  Overweight  BMI 25 -- Outpatient follow up for lifestyle modification and risk factor management   DVT prophylaxis: warfarin   Code Status: Limited: Do not attempt resuscitation (DNR) -DNR-LIMITED -Do Not  Intubate/DNI  Disposition:  Inpatient, pending work up and calcium resolution. PT evals today to determine dispo planning  Consultants:  Treatment Team:  Consulting Physician: Lateef, Munsoor, MD  Procedures:    Antimicrobials:  Anti-infectives (From admission, onward)    None       Data Reviewed: I have personally reviewed following labs and imaging studies CBC: Recent Labs  Lab 06/29/24 1657 06/30/24 0505  WBC 6.3 4.6  HGB 15.3 13.5  HCT 45.4 40.1  MCV 99.1 98.3  PLT 213 186   Basic Metabolic Panel: Recent Labs  Lab 06/23/24 1420 06/29/24 1812 06/30/24 0505  NA 140 140 140  K 4.7 5.0 3.9  CL 106 109 110  CO2 21 24 23   GLUCOSE 89 102* 91  BUN 29 31* 29*  CREATININE 2.15* 2.31* 1.86*  CALCIUM 12.2* 12.1* 11.1*  MG  --  2.3  --   PHOS  --  2.8  --    GFR: Estimated Creatinine Clearance: 24.7 mL/min (A) (by C-G formula based on SCr of 1.86 mg/dL (H)). Liver Function Tests: No results for input(s): AST, ALT, ALKPHOS, BILITOT, PROT, ALBUMIN in the last 168 hours. CBG: No results for input(s): GLUCAP in the last 168 hours.  Recent Results (from the past 240 hours)  Urine Culture     Status: None   Collection Time: 06/23/24  2:19 AM   Specimen: Blood   UR  Result Value Ref Range Status   Urine Culture, Routine Final report  Final   Organism ID, Bacteria No growth  Final  Microscopic Examination     Status: None   Collection Time: 06/23/24  2:15 PM   Urine  Result Value Ref Range Status   WBC, UA 0-5 0 - 5 /hpf Final   RBC, Urine 0-2 0 - 2 /hpf Final   Epithelial Cells (non renal) 0-10 0 - 10 /hpf Final   Bacteria, UA Few None seen/Few Final     Radiology Studies: CT CHEST ABDOMEN PELVIS WO CONTRAST Result Date: 06/29/2024 CLINICAL DATA:  Unintended weight loss EXAM: CT CHEST, ABDOMEN AND PELVIS WITHOUT CONTRAST TECHNIQUE: Multidetector CT imaging of the chest, abdomen and pelvis was performed following the standard protocol without IV  contrast. RADIATION DOSE REDUCTION: This exam was performed according to the departmental dose-optimization program which includes automated exposure control, adjustment of the mA and/or kV according to patient size and/or use of iterative reconstruction technique. COMPARISON:  11/03/12 FINDINGS: CT CHEST FINDINGS Cardiovascular: Atherosclerotic calcifications of the aorta are noted. No aneurysmal dilatation is seen. Coronary calcifications are noted. No cardiac enlargement is seen. Mediastinum/Nodes: Thoracic inlet is within normal limits. No hilar or mediastinal adenopathy is noted. The esophagus as visualized is within normal limits. Lungs/Pleura: Lungs are well aerated bilaterally. Mild atelectatic changes are noted in the lower lobes bilaterally. No focal effusion is noted. Calcified granuloma is noted in the left upper lobe. Additionally a 5 mm nodule is noted in the lingula but stable from the prior exam. No further follow-up is recommended. Musculoskeletal: Degenerative changes of the thoracic  spine are noted. CT ABDOMEN PELVIS FINDINGS Hepatobiliary: No focal liver abnormality is seen. No gallstones, gallbladder wall thickening, or biliary dilatation. Pancreas: Unremarkable. No pancreatic ductal dilatation or surrounding inflammatory changes. Spleen: Normal in size without focal abnormality. Adrenals/Urinary Tract: Adrenal glands are within normal limits. Kidneys are well visualized bilaterally. No renal calculi or obstructive changes are seen. Simple cyst is noted in the lower pole of right kidney. No further follow-up is recommended. Ureters are within normal limits. The bladder is partially distended. Stomach/Bowel: Scattered diverticular change of the colon is noted without evidence of diverticulitis. The appendix is within normal limits. Small bowel and stomach are unremarkable. Vascular/Lymphatic: Aortic atherosclerosis. No enlarged abdominal or pelvic lymph nodes. Reproductive: Prostate is  unremarkable. Other: No abdominal wall hernia or abnormality. No abdominopelvic ascites. Musculoskeletal: No acute or significant osseous findings. IMPRESSION: CT of the chest: Basilar atelectatic changes. Findings of prior granulomatous disease. 5 mm nodule in the lingula stable from 2013. No follow-up is recommended given its long-term stability. CT of the abdomen and pelvis: Diverticulosis without diverticulitis. Simple renal cyst on the right.  No follow-up is recommended. Electronically Signed   By: Oneil Devonshire M.D.   On: 06/29/2024 20:12    Scheduled Meds:  amLODipine   2.5 mg Oral Daily   ascorbic acid   1,000 mg Oral Daily   cyanocobalamin   1,000 mcg Oral Q0600   donepezil   10 mg Oral QHS   gabapentin   200 mg Oral QHS   loratadine   10 mg Oral Daily   tamsulosin  0.4 mg Oral QPC supper   warfarin  5 mg Oral ONCE-1600   Warfarin - Pharmacist Dosing Inpatient   Does not apply q1600   Continuous Infusions:  sodium chloride  75 mL/hr at 06/30/24 0851     LOS: 0 days  MDM: Patient is high risk for one or more organ failure.  They necessitate ongoing hospitalization for continued IV therapies and subsequent lab monitoring. Total time spent interpreting labs and vitals, reviewing the medical record, coordinating care amongst consultants and care team members, directly assessing and discussing care with the patient and/or family: 55 min  Randy Whitener, DO [facility]  To contact the attending physician between 7A-7P please use Epic Chat. To contact the covering physician during after hours 7P-7A, please review Amion.  06/30/2024, 12:29 PM   *This document has been created with the assistance of dictation software. Please excuse typographical errors. *

## 2024-06-30 NOTE — Progress Notes (Signed)
 [facility]  ROUNDING NOTE   Subjective:   Gregg Thomas is a 89 year old male with past medical conditions including hypertension, dementia, DVT on Coumadin , skin cancer, hard of hearing, and chronic kidney disease stage IV.  Patient presents to the emergency department at the advice of his nephrologist due to abnormal labs.  Patient has been admitted for Hypercalcemia [E83.52] Hyperglycemia [R73.9]  Patient is known to our practice and is followed by Dr. Marcelino outpatient.  Patient was last seen in the office on 6/30 and was found to have an abnormally elevated calcium level.  Wife denies any excessive calcium intake such as milk, ice cream, or Tums.  Denies any other supplements.  States patient was recently treated with Cipro  for UTI.  States his behavior change while taking that medication.  Also began to have more weakness and confusion.  Labs on ED arrival consisted of potassium 5.0, BUN 31, creatinine 2.31, calcium 12.1.  UA appears clear with some bacteria.  CT abdomen pelvis negative for renal obstruction or mass.  We have been consulted to help evaluate and manage hypercalcemia.   Objective:  Vital signs in last 24 hours:  Temp:  [97.5 F (36.4 C)-98.3 F (36.8 C)] 98.3 F (36.8 C) (07/01 1553) Pulse Rate:  [56-68] 68 (07/01 1553) Resp:  [15-20] 18 (07/01 1553) BP: (108-175)/(59-94) 108/62 (07/01 1553) SpO2:  [91 %-98 %] 93 % (07/01 1553)  Weight change:  Filed Weights   06/29/24 1635  Weight: 74.8 kg    Intake/Output: No intake/output data recorded.   Intake/Output this shift:  Total I/O In: 1342.1 [I.V.:1342.1] Out: -   Physical Exam: General: NAD  Head: Normocephalic, atraumatic. Moist oral mucosal membranes  Eyes: Anicteric  Neck: Supple  Lungs:  Clear to auscultation, normal effort, room air  Heart: Regular rate and rhythm  Abdomen:  Soft, nontender  Extremities: No peripheral edema.  Neurologic: Awake, alert  Skin: Warm,dry,  no rash  Access: None    Basic Metabolic Panel: Recent Labs  Lab 06/29/24 1812 06/30/24 0505  NA 140 140  K 5.0 3.9  CL 109 110  CO2 24 23  GLUCOSE 102* 91  BUN 31* 29*  CREATININE 2.31* 1.86*  CALCIUM 12.1* 11.1*  MG 2.3  --   PHOS 2.8  --     Liver Function Tests: No results for input(s): AST, ALT, ALKPHOS, BILITOT, PROT, ALBUMIN in the last 168 hours. No results for input(s): LIPASE, AMYLASE in the last 168 hours. No results for input(s): AMMONIA in the last 168 hours.  CBC: Recent Labs  Lab 06/29/24 1657 06/30/24 0505  WBC 6.3 4.6  HGB 15.3 13.5  HCT 45.4 40.1  MCV 99.1 98.3  PLT 213 186    Cardiac Enzymes: No results for input(s): CKTOTAL, CKMB, CKMBINDEX, TROPONINI in the last 168 hours.  BNP: Invalid input(s): POCBNP  CBG: No results for input(s): GLUCAP in the last 168 hours.  Microbiology: Results for orders placed or performed in visit on 06/23/24  Urine Culture     Status: None   Collection Time: 06/23/24  2:19 AM   Specimen: Blood   UR  Result Value Ref Range Status   Urine Culture, Routine Final report  Final   Organism ID, Bacteria No growth  Final  Microscopic Examination     Status: None   Collection Time: 06/23/24  2:15 PM   Urine  Result Value Ref Range Status   WBC, UA 0-5 0 - 5 /hpf Final  RBC, Urine 0-2 0 - 2 /hpf Final   Epithelial Cells (non renal) 0-10 0 - 10 /hpf Final   Bacteria, UA Few None seen/Few Final    Coagulation Studies: Recent Labs    06/29/24 19-Jul-2116 06/30/24 0505  LABPROT 23.2* 24.6*  INR 2.0* 2.1*    Urinalysis: Recent Labs    06/29/24 1707  COLORURINE YELLOW*  LABSPEC 1.017  PHURINE 5.0  GLUCOSEU NEGATIVE  HGBUR NEGATIVE  BILIRUBINUR NEGATIVE  KETONESUR NEGATIVE  PROTEINUR 100*  NITRITE NEGATIVE  LEUKOCYTESUR NEGATIVE      Imaging: CT CHEST ABDOMEN PELVIS WO CONTRAST Result Date: 06/29/2024 CLINICAL DATA:  Unintended weight loss EXAM: CT CHEST, ABDOMEN AND  PELVIS WITHOUT CONTRAST TECHNIQUE: Multidetector CT imaging of the chest, abdomen and pelvis was performed following the standard protocol without IV contrast. RADIATION DOSE REDUCTION: This exam was performed according to the departmental dose-optimization program which includes automated exposure control, adjustment of the mA and/or kV according to patient size and/or use of iterative reconstruction technique. COMPARISON:  11/03/12 FINDINGS: CT CHEST FINDINGS Cardiovascular: Atherosclerotic calcifications of the aorta are noted. No aneurysmal dilatation is seen. Coronary calcifications are noted. No cardiac enlargement is seen. Mediastinum/Nodes: Thoracic inlet is within normal limits. No hilar or mediastinal adenopathy is noted. The esophagus as visualized is within normal limits. Lungs/Pleura: Lungs are well aerated bilaterally. Mild atelectatic changes are noted in the lower lobes bilaterally. No focal effusion is noted. Calcified granuloma is noted in the left upper lobe. Additionally a 5 mm nodule is noted in the lingula but stable from the prior exam. No further follow-up is recommended. Musculoskeletal: Degenerative changes of the thoracic spine are noted. CT ABDOMEN PELVIS FINDINGS Hepatobiliary: No focal liver abnormality is seen. No gallstones, gallbladder wall thickening, or biliary dilatation. Pancreas: Unremarkable. No pancreatic ductal dilatation or surrounding inflammatory changes. Spleen: Normal in size without focal abnormality. Adrenals/Urinary Tract: Adrenal glands are within normal limits. Kidneys are well visualized bilaterally. No renal calculi or obstructive changes are seen. Simple cyst is noted in the lower pole of right kidney. No further follow-up is recommended. Ureters are within normal limits. The bladder is partially distended. Stomach/Bowel: Scattered diverticular change of the colon is noted without evidence of diverticulitis. The appendix is within normal limits. Small bowel and  stomach are unremarkable. Vascular/Lymphatic: Aortic atherosclerosis. No enlarged abdominal or pelvic lymph nodes. Reproductive: Prostate is unremarkable. Other: No abdominal wall hernia or abnormality. No abdominopelvic ascites. Musculoskeletal: No acute or significant osseous findings. IMPRESSION: CT of the chest: Basilar atelectatic changes. Findings of prior granulomatous disease. 5 mm nodule in the lingula stable from July 19, 2012. No follow-up is recommended given its long-term stability. CT of the abdomen and pelvis: Diverticulosis without diverticulitis. Simple renal cyst on the right.  No follow-up is recommended. Electronically Signed   By: Oneil Devonshire M.D.   On: 06/29/2024 20:12     Medications:    sodium chloride  75 mL/hr at 06/30/24 1538    amLODipine   2.5 mg Oral Daily   ascorbic acid   1,000 mg Oral Daily   cyanocobalamin   1,000 mcg Oral Q0600   donepezil   10 mg Oral QHS   gabapentin   200 mg Oral QHS   loratadine   10 mg Oral Daily   tamsulosin  0.4 mg Oral QPC supper   Warfarin - Pharmacist Dosing Inpatient   Does not apply q1600   acetaminophen , albuterol , dextromethorphan-guaiFENesin , hydrALAZINE , ondansetron  (ZOFRAN ) IV  Assessment/ Plan:  Mr. Gregg Thomas is a 89 y.o.  male with  past medical conditions including hypertension, dementia, DVT on Coumadin , skin cancer, hard of hearing, and chronic kidney disease stage IV.  Patient presents to the emergency department at the advice of his nephrologist due to abnormal labs.  Patient has been admitted for Hypercalcemia [E83.52] Hyperglycemia [R73.9]   Hypercalcemia, etiology unclear.  Calcium 12.1, has improved to 11.1 since admission.  Denies excessive calcium intake or supplements.  Received [place] outpatient. Recent treatment for UTI however Cipro  usually does not have this effect.  Will continue hydration.  Will order PTH for a.m.  Acute Kidney Injury on chronic kidney disease stage IV with baseline creatinine 1.8. Acute  kidney injury secondary to poor oral intake and recent infection. Renal function has returned to baseline with IV fluids.   Lab Results  Component Value Date   CREATININE 1.86 (H) 06/30/2024   CREATININE 2.31 (H) 06/29/2024   CREATININE 2.15 (H) 06/23/2024    Intake/Output Summary (Last 24 hours) at 06/30/2024 1741 Last data filed at 06/30/2024 1538 Gross per 24 hour  Intake 1342.13 ml  Output --  Net 1342.13 ml    3. Anemia of chronic kidney disease Lab Results  Component Value Date   HGB 13.5 06/30/2024    Hgb within desired range for this patient.   4. Hypertension with chronic kidney disease. Home regimen includes amlodipine    LOS: 0 Gregg Thomas 7/1/20255:41 PM

## 2024-06-30 NOTE — Progress Notes (Addendum)
 SLP Cancellation Note  Patient Details Name: Gregg Thomas MRN: [id number] DOB: [birth year]   Cancelled treatment:       Reason Eval/Treat Not Completed:  (chart reviewed; consulted MD re: pt's status and POC to f/u w/ Neurologist for Cognitive assessment (Dementia) per referral by his primary DO.)  Per chart notes and discussion w/ Attending MD, pt is a 89 year old male with Dementia, CKD stage IV, hypertension, BPH, DVT on Coumadin , skin cancer, hard of hearing, who presents with fatigue, gait imbalance, and abnormal labs with hypercalcemia. Per H&P, his wife reports that the patient has been gradually declining over the last few weeks; has been endorsing pain in both legs and has had increasing confusion; Presently the patient is pleasantly confused which his wife reports is near baseline..    Per chart, pt has a pending order to see Neurology at [facility] already-- on 06/10/2024, his DO referred him (Ambulatory referral to Neurology) for: Concern for worsening Dementia. Wife would like for him to see Neurology. Referral placed today.. Per the chart notes, it does not appear that he has had this appt yet.   ST services would recommend for pt to have f/u w/ a Full neuro-cognitive workup OP(post acuity of illness) by Neurology and that a bedside screening by ST services be deferred at this time, especially in setting of pt's Wife stating pt is near baseline w/ his Dementia presentation during an Acute illness and hospitalization. Dementia presentation could be expected to worsen in setting of illness/hospitalization.  ST services would recommend 24/7 Supervision at D/C d/t his Baseline Cognitive decline.   MD agreed and will reconsult ST services if any new needs arise during admit.      Comer Portugal, MS, CCC-SLP Speech Language Pathologist Rehab Services; [facility] [phone] (ascom) Germaine Ripp 06/30/2024, 2:28 PM

## 2024-06-30 NOTE — Progress Notes (Signed)
 PHARMACIST - PHYSICIAN ORDER COMMUNICATION  CONCERNING: P&T Medication Policy on Herbal Medications  DESCRIPTION:  This patient's order for:  Elderberry,  Grape Seed Extract   has been noted.  This product(s) is classified as an "herbal" or natural product. Due to a lack of definitive safety studies or FDA approval, nonstandard manufacturing practices, plus the potential risk of unknown drug-drug interactions while on inpatient medications, the Pharmacy and Therapeutics Committee does not permit the use of "herbal" or natural products of this type within [facility].   ACTION TAKEN: The pharmacy department is unable to verify this order at this time and your patient has been informed of this safety policy. Please reevaluate patient's clinical condition at discharge and address if the herbal or natural product(s) should be resumed at that time.

## 2024-06-30 NOTE — Care Management Obs Status (Signed)
 MEDICARE OBSERVATION STATUS NOTIFICATION   Patient Details  Name: Gregg Thomas MRN: [id number] Date of Birth: 03/06/1931   Medicare Observation Status Notification Given:  Yes    Tavari Loadholt W, CMA 06/30/2024, 11:18 AM

## 2024-06-30 NOTE — Evaluation (Signed)
 Physical Therapy Evaluation Patient Details Name: Gregg Thomas MRN: [id number] DOB: [birth year] Today's Date: 06/30/2024  History of Present Illness  Pt is a 89 year old male with CKD stage IV, hypertension, dementia, BPH, DVT on Coumadin , skin cancer, hard of hearing, who presents with fatigue, gait imbalance, and abnormal labs with hypercalcemia.  Clinical Impression  Patient alert, agreeable to PT, oriented to self and family in room. Per pt's daughter in the last several weeks he has had increasing weakness, previously not using AD but now using a RW, 3 falls in the last week. Wife provides assists for ADLs, and perform IADLs. Receiving HHPT.   He was able to perform bed mobility with CGA, use of bed rails, extra time. Sit <> Stand 4 times during session, progressed from minA to CGA, cues for hand placement and RW placement each time. He ambulated ~44ft to recliner, and after doffing/donning new briefs (maxA), was able to ambulate ~35ft. No LOB noted, but pt with very shuffled step, flexed trunk. Gait similar to festination-like gait.  Overall the patient demonstrated deficits (see PT Problem List) that impede the patient's functional abilities, safety, and mobility and would benefit from skilled PT intervention.          If plan is discharge home, recommend the following: A little help with walking and/or transfers;A little help with bathing/dressing/bathroom;Assistance with cooking/housework;Assist for transportation;Help with stairs or ramp for entrance   Can travel by private vehicle        Equipment Recommendations BSC/3in1  Recommendations for Other Services       Functional Status Assessment Patient has had a recent decline in their functional status and demonstrates the ability to make significant improvements in function in a reasonable and predictable amount of time.     Precautions / Restrictions Precautions Precautions: Fall Restrictions Weight Bearing  Restrictions Per Provider Order: No      Mobility  Bed Mobility Overal bed mobility: Needs Assistance Bed Mobility: Supine to Sit     Supine to sit: Supervision, Used rails, HOB elevated          Transfers Overall transfer level: Needs assistance   Transfers: Sit to/from Stand Sit to Stand: Contact guard assist, Min assist           General transfer comment: first attempt very light minA, 2-4th attempt with CGA, RW/hand placement cues    Ambulation/Gait Ambulation/Gait assistance: Contact guard assist Gait Distance (Feet):  (28ft, then additional 26ft) Assistive device: Rolling walker (2 wheels)   Gait velocity: decreased     General Gait Details: shuffled step, almost appeared like festination of gait  Stairs            Wheelchair Mobility     Tilt Bed    Modified Rankin (Stroke Patients Only)       Balance Overall balance assessment: Needs assistance Sitting-balance support: Feet supported Sitting balance-Leahy Scale: Fair Sitting balance - Comments: able to assist with doffing/donning briefs but still required maxA   Standing balance support: Bilateral upper extremity supported Standing balance-Leahy Scale: Fair                               Pertinent Vitals/Pain Pain Assessment Pain Assessment: No/denies pain    Home Living Family/patient expects to be discharged to:: Private residence Living Arrangements: Spouse/significant other Available Help at Discharge: Family;Available 24 hours/day Type of Home: House Home Access: Stairs to enter Entrance Stairs-Rails: Left Entrance Stairs-Number of  Steps: 3   Home Layout: Two level;Able to live on main level with bedroom/bathroom Home Equipment: Rolling Walker (2 wheels);Cane - single point;Shower seat - built in Additional Comments: was not using AD prior to admission    Prior Function Prior Level of Function : Driving;Independent/Modified Independent              Mobility Comments: for the last several weeks has needed a SPC and then a RW ADLs Comments: family assists with all ADLs, physically and cognitively     Extremity/Trunk Assessment   Upper Extremity Assessment Upper Extremity Assessment: Generalized weakness    Lower Extremity Assessment Lower Extremity Assessment: Generalized weakness       Communication        Cognition Arousal: Alert Behavior During Therapy: WFL for tasks assessed/performed   PT - Cognitive impairments: History of cognitive impairments                       PT - Cognition Comments: pt oriented to self and family, pt family endorsed this is baseline Following commands: Intact       Cueing       General Comments      Exercises     Assessment/Plan    PT Assessment Patient needs continued PT services  PT Problem List Decreased activity tolerance;Decreased balance;Decreased mobility;Decreased safety awareness;Decreased knowledge of use of DME       PT Treatment Interventions DME instruction;Balance training;Gait training;Neuromuscular re-education;Stair training;Functional mobility training;Patient/family education;Therapeutic activities;Therapeutic exercise    PT Goals (Current goals can be found in the Care Plan section)  Acute Rehab PT Goals Patient Stated Goal: to get his strength back PT Goal Formulation: With family Time For Goal Achievement: 07/14/24 Potential to Achieve Goals: Good    Frequency Min 3X/week     Co-evaluation               AM-PAC PT 6 Clicks Mobility  Outcome Measure Help needed turning from your back to your side while in a flat bed without using bedrails?: A Little Help needed moving from lying on your back to sitting on the side of a flat bed without using bedrails?: A Little Help needed moving to and from a bed to a chair (including a wheelchair)?: A Little Help needed standing up from a chair using your arms (e.g., wheelchair or bedside  chair)?: A Little Help needed to walk in hospital room?: A Little Help needed climbing 3-5 steps with a railing? : A Little 6 Click Score: 18    End of Session Equipment Utilized During Treatment: Gait belt Activity Tolerance: Patient tolerated treatment well Patient left: in chair;with call bell/phone within reach;with chair alarm set;with family/visitor present Nurse Communication: Mobility status PT Visit Diagnosis: Other abnormalities of gait and mobility (R26.89);Difficulty in walking, not elsewhere classified (R26.2);Muscle weakness (generalized) (M62.81)    Time: 8642-8579 PT Time Calculation (min) (ACUTE ONLY): 23 min   Charges:   PT Evaluation $PT Eval Low Complexity: 1 Low PT Treatments $Therapeutic Activity: 8-22 mins PT General Charges $$ ACUTE PT VISIT: 1 Visit        Doyal Shams PT, DPT 3:40 PM,06/30/24

## 2024-06-30 NOTE — Progress Notes (Signed)
 PHARMACY - ANTICOAGULATION CONSULT NOTE  Pharmacy Consult for warfarin Indication: Hx of DVT (2020)   No Known Allergies  Patient Measurements: Height: 5' 7 (170.2 cm) Weight: 74.8 kg (165 lb) IBW/kg (Calculated) : 66.1 HEPARIN  DW (KG): 74.8  Vital Signs: Temp: 98 F (36.7 C) (07/01 0810) Temp Source: Oral (06/30 2135) BP: 130/61 (07/01 0810) Pulse Rate: 60 (07/01 0810)  Labs: Recent Labs    06/29/24 1657 06/29/24 1812 06/29/24 2117 06/30/24 0505  HGB 15.3  --   --  13.5  HCT 45.4  --   --  40.1  PLT 213  --   --  186  LABPROT  --   --  23.2* 24.6*  INR  --   --  2.0* 2.1*  CREATININE  --  2.31*  --  1.86*    Estimated Creatinine Clearance: 24.7 mL/min (A) (by C-G formula based on SCr of 1.86 mg/dL (H)).   Medical History: Past Medical History:  Diagnosis Date   Chronic kidney disease    Clotting disorder (HCC)    Dementia (HCC)    Heart murmur    Skin cancer     Medications:  Scheduled:   amLODipine   2.5 mg Oral Daily   ascorbic acid   1,000 mg Oral Daily   cyanocobalamin   1,000 mcg Oral Q0600   donepezil   10 mg Oral QHS   gabapentin   200 mg Oral QHS   loratadine   10 mg Oral Daily   tamsulosin  0.4 mg Oral QPC supper   Warfarin - Pharmacist Dosing Inpatient   Does not apply q1600   Assessment: 89 y/o male presenting with hypercalcemia. PMH significant for CKD stage IV, hypertension, dementia, BPH, DVT on Coumadin , skin cancer. Pharmacy has been consulted to resume and manage warfarin.    Warfarin regimen: 5 mg daily (total weekly dose of 35 mg) Last warfarin dose prior to admission: 5 mg on 06/28/24 @ 1800  Baseline labs: INR 2.0, hgb 15.3, plt 213  Date INR Warfarin Dose 06/30 2.0 5 mg 07/01 2.1   Goal of Therapy:  INR 2-3 Monitor platelets by anticoagulation protocol: Yes   Plan:  Give warfarin 5 mg x1 tonight Monitor daily INR while inpatient Monitor CBC at least weekly  Thank you for involving pharmacy in this patient's care.    Will M. Lenon, PharmD Clinical Pharmacist 06/30/2024 8:42 AM

## 2024-07-01 LAB — BASIC METABOLIC PANEL WITH GFR
Anion gap: 6 (ref 5–15)
BUN: 29 mg/dL — ABNORMAL HIGH (ref 8–23)
CO2: 22 mmol/L (ref 22–32)
Calcium: 10.6 mg/dL — ABNORMAL HIGH (ref 8.9–10.3)
Chloride: 111 mmol/L (ref 98–111)
Creatinine, Ser: 1.89 mg/dL — ABNORMAL HIGH (ref 0.61–1.24)
GFR, Estimated: 33 mL/min — ABNORMAL LOW (ref >60)
Glucose, Bld: 82 mg/dL (ref 70–99)
Potassium: 4.2 mmol/L (ref 3.5–5.1)
Sodium: 139 mmol/L (ref 135–145)

## 2024-07-01 LAB — CALCITRIOL (1,25 DI-OH VIT D): Vit D, 1,25-Dihydroxy: 31.4 pg/mL (ref 24.8–81.5)

## 2024-07-01 LAB — PROTIME-INR
INR: 2.2 — ABNORMAL HIGH (ref 0.8–1.2)
Prothrombin Time: 25.1 s — ABNORMAL HIGH (ref 11.4–15.2)

## 2024-07-01 MED ORDER — WARFARIN SODIUM 5 MG PO TABS
5.0000 mg | ORAL_TABLET | Freq: Every day | ORAL | Status: DC
  Administered 2024-07-01: 5 mg via ORAL
  Filled 2024-07-01: qty 1

## 2024-07-01 MED ORDER — WARFARIN SODIUM 5 MG PO TABS
5.0000 mg | ORAL_TABLET | Freq: Every day | ORAL | Status: DC
  Filled 2024-07-01: qty 1

## 2024-07-01 NOTE — Plan of Care (Signed)
 PMT consult noted.  Arrived to room and patient has visitors at bedside which he is entertaining. Will follow-up at a later time.

## 2024-07-01 NOTE — Plan of Care (Signed)

## 2024-07-01 NOTE — Progress Notes (Signed)
 [facility]  ROUNDING NOTE   Subjective:  Gregg Thomas is a 89 year old male with past medical conditions including hypertension, dementia, DVT on Coumadin , skin cancer, hard of hearing, and chronic kidney disease stage IV.  Patient presents to the emergency department at the advice of his nephrologist due to abnormal labs.  Patient has been admitted for Hypercalcemia [E83.52] Hyperglycemia [R73.9]   Patient is known to our practice and is followed by Dr. Marcelino outpatient.  Patient was last seen in the office on 6/30 and was found to have an abnormally elevated calcium level.  Wife denies any excessive calcium intake such as milk, ice cream, or Tums.  Denies any other supplements.  States patient was recently treated with Cipro  for UTI.  States his behavior change while taking that medication.  Also began to have more weakness and confusion. Labs on ED arrival consisted of potassium 5.0, BUN 31, creatinine 2.31, calcium 12.1.  UA appears clear with some bacteria.  CT abdomen pelvis negative for renal obstruction or mass. We have been consulted to help evaluate and manage hypercalcemia. Update: No acute events reported overnight. Wife at bedside. Patient denies discomfort, denies shortness of breath. On room air. Encouraged to increase PO intake.   Objective:  Vital signs in last 24 hours:  Temp:  [97.7 F (36.5 C)-98 F (36.7 C)] 98 F (36.7 C) (07/02 1146) Pulse Rate:  [54-68] 57 (07/02 1546) Resp:  [16-18] 17 (07/02 1546) BP: (121-167)/(55-75) 148/65 (07/02 1546) SpO2:  [90 %-95 %] 95 % (07/02 1546) Weight:  [75.6 kg] 75.6 kg (07/02 0500)  Weight change: 0.756 kg Filed Weights   06/29/24 1635 07/01/24 0500  Weight: 74.8 kg 75.6 kg    Intake/Output: I/O last 3 completed shifts: In: 1342.1 [I.V.:1342.1] Out: 1000 [Urine:1000]   Intake/Output this shift:  Total I/O In: 120 [P.O.:120] Out: 500 [Urine:500]  Physical Exam: General: NAD,   Head:  Normocephalic, atraumatic. Moist oral mucosal membranes  Eyes: Anicteric  Neck: Supple  Lungs:  Clear to auscultation, normal effort, room air  Heart: Regular rate and rhythm  Abdomen:  Soft, nontender,   Extremities:  None peripheral edema.  Neurologic: Alert and awake  Skin: No lesions  Access: None    Basic Metabolic Panel: Recent Labs  Lab 06/29/24 1812 06/30/24 0505 07/01/24 0404  NA 140 140 139  K 5.0 3.9 4.2  CL 109 110 111  CO2 24 23 22   GLUCOSE 102* 91 82  BUN 31* 29* 29*  CREATININE 2.31* 1.86* 1.89*  CALCIUM 12.1* 11.1* 10.6*  MG 2.3  --   --   PHOS 2.8  --   --     Liver Function Tests: No results for input(s): AST, ALT, ALKPHOS, BILITOT, PROT, ALBUMIN in the last 168 hours. No results for input(s): LIPASE, AMYLASE in the last 168 hours. No results for input(s): AMMONIA in the last 168 hours.  CBC: Recent Labs  Lab 06/29/24 1657 06/30/24 0505  WBC 6.3 4.6  HGB 15.3 13.5  HCT 45.4 40.1  MCV 99.1 98.3  PLT 213 186    Cardiac Enzymes: No results for input(s): CKTOTAL, CKMB, CKMBINDEX, TROPONINI in the last 168 hours.  BNP: Invalid input(s): POCBNP  CBG: No results for input(s): GLUCAP in the last 168 hours.  Microbiology: Results for orders placed or performed in visit on 06/23/24  Urine Culture     Status: None   Collection Time: 06/23/24  2:19 AM   Specimen: Blood   UR  Result Value Ref Range Status   Urine Culture, Routine Final report  Final   Organism ID, Bacteria No growth  Final  Microscopic Examination     Status: None   Collection Time: 06/23/24  2:15 PM   Urine  Result Value Ref Range Status   WBC, UA 0-5 0 - 5 /hpf Final   RBC, Urine 0-2 0 - 2 /hpf Final   Epithelial Cells (non renal) 0-10 0 - 10 /hpf Final   Bacteria, UA Few None seen/Few Final    Coagulation Studies: Recent Labs    06/29/24 2116-08-04 06/30/24 0505 07/01/24 0404  LABPROT 23.2* 24.6* 25.1*  INR 2.0* 2.1* 2.2*     Urinalysis: Recent Labs    06/29/24 1707  COLORURINE YELLOW*  LABSPEC 1.017  PHURINE 5.0  GLUCOSEU NEGATIVE  HGBUR NEGATIVE  BILIRUBINUR NEGATIVE  KETONESUR NEGATIVE  PROTEINUR 100*  NITRITE NEGATIVE  LEUKOCYTESUR NEGATIVE      Imaging: CT CHEST ABDOMEN PELVIS WO CONTRAST Result Date: 06/29/2024 CLINICAL DATA:  Unintended weight loss EXAM: CT CHEST, ABDOMEN AND PELVIS WITHOUT CONTRAST TECHNIQUE: Multidetector CT imaging of the chest, abdomen and pelvis was performed following the standard protocol without IV contrast. RADIATION DOSE REDUCTION: This exam was performed according to the departmental dose-optimization program which includes automated exposure control, adjustment of the mA and/or kV according to patient size and/or use of iterative reconstruction technique. COMPARISON:  11/03/12 FINDINGS: CT CHEST FINDINGS Cardiovascular: Atherosclerotic calcifications of the aorta are noted. No aneurysmal dilatation is seen. Coronary calcifications are noted. No cardiac enlargement is seen. Mediastinum/Nodes: Thoracic inlet is within normal limits. No hilar or mediastinal adenopathy is noted. The esophagus as visualized is within normal limits. Lungs/Pleura: Lungs are well aerated bilaterally. Mild atelectatic changes are noted in the lower lobes bilaterally. No focal effusion is noted. Calcified granuloma is noted in the left upper lobe. Additionally a 5 mm nodule is noted in the lingula but stable from the prior exam. No further follow-up is recommended. Musculoskeletal: Degenerative changes of the thoracic spine are noted. CT ABDOMEN PELVIS FINDINGS Hepatobiliary: No focal liver abnormality is seen. No gallstones, gallbladder wall thickening, or biliary dilatation. Pancreas: Unremarkable. No pancreatic ductal dilatation or surrounding inflammatory changes. Spleen: Normal in size without focal abnormality. Adrenals/Urinary Tract: Adrenal glands are within normal limits. Kidneys are well  visualized bilaterally. No renal calculi or obstructive changes are seen. Simple cyst is noted in the lower pole of right kidney. No further follow-up is recommended. Ureters are within normal limits. The bladder is partially distended. Stomach/Bowel: Scattered diverticular change of the colon is noted without evidence of diverticulitis. The appendix is within normal limits. Small bowel and stomach are unremarkable. Vascular/Lymphatic: Aortic atherosclerosis. No enlarged abdominal or pelvic lymph nodes. Reproductive: Prostate is unremarkable. Other: No abdominal wall hernia or abnormality. No abdominopelvic ascites. Musculoskeletal: No acute or significant osseous findings. IMPRESSION: CT of the chest: Basilar atelectatic changes. Findings of prior granulomatous disease. 5 mm nodule in the lingula stable from 08-04-12. No follow-up is recommended given its long-term stability. CT of the abdomen and pelvis: Diverticulosis without diverticulitis. Simple renal cyst on the right.  No follow-up is recommended. Electronically Signed   By: Oneil Devonshire M.D.   On: 06/29/2024 20:12     Medications:     amLODipine   2.5 mg Oral Daily   ascorbic acid   1,000 mg Oral Daily   cyanocobalamin   1,000 mcg Oral Q0600   donepezil   10 mg Oral QHS   gabapentin   200 mg  Oral QHS   loratadine   10 mg Oral Daily   tamsulosin  0.4 mg Oral QPC supper   warfarin  5 mg Oral q1600   Warfarin - Pharmacist Dosing Inpatient   Does not apply q1600   acetaminophen , albuterol , dextromethorphan-guaiFENesin , ondansetron  (ZOFRAN ) IV  Assessment/ Plan:  Mr. Nicoli Nardozzi is a 89 y.o. male with past medical conditions including hypertension, dementia, DVT on Coumadin , skin cancer, hard of hearing, and chronic kidney disease stage IV.  Patient presents to the emergency department at the advice of his nephrologist due to abnormal labs.  Patient has been admitted for Hypercalcemia [E83.52] Hyperglycemia [R73.9]     Hypercalcemia,  etiology unclear.  Calcium 12.1, has improved to 10.6 today.  Denies excessive calcium intake or supplements.  Received [facility] outpatient. Recent treatment for UTI however Cipro  usually does not have this effect. PTH in process.  -Recommend endocrinology follow-up post discharge.   Acute Kidney Injury on chronic kidney disease stage IV with baseline creatinine 1.8. Acute kidney injury secondary to poor oral intake and recent infection. Renal function has returned to baseline with IV fluids overnight, has now since stopped.  -Recommend 2-3 week follow-up with our office post discharge.  Lab Results  Component Value Date   CREATININE 1.89 (H) 07/01/2024   CREATININE 1.86 (H) 06/30/2024   CREATININE 2.31 (H) 06/29/2024     Intake/Output Summary (Last 24 hours) at 07/01/2024 1658 Last data filed at 07/01/2024 1444 Gross per 24 hour  Intake 120 ml  Output 1500 ml  Net -1380 ml      3. Anemia of chronic kidney disease Recent Labs       Lab Results  Component Value Date    HGB 13.5 06/30/2024      Hgb within desired range for this patient.    4. Hypertension with chronic kidney disease. Home regimen includes amlodipine     LOS: 0 Zakarie Sturdivant SHAUNNA Dines 7/2/20254:53 PM

## 2024-07-01 NOTE — Progress Notes (Signed)
 Occupational Therapy Evaluation Patient Details Name: Gregg Thomas MRN: [id number] DOB: July 12, 1931 Today's Date: 07/01/2024   History of Present Illness   Pt is a 89 year old male with CKD stage IV, hypertension, dementia, BPH, DVT on Coumadin , skin cancer, hard of hearing, who presents with fatigue, gait imbalance, and abnormal labs with hypercalcemia.     Clinical Impressions Pt admitted with above diagnosis, spouse present for OT eval. Pt asleep upon arrival, awakens to spouse's voice. Pt is pleasantly confused, hx of dementia. Spouse confirms increasing fatigue + weakness for about 3 weeks, with +3 falls (legs give out spontaneously once standing). Spouse provides cognitive + physical assist for all ADLs (pt can feed himself), and IADLs, pt has been receiving HHPT for 1.5 yrs. Bed mobility performed with CGA - supervision, stands with cues for hand placement with CGA, step pivot to recliner CGA using RW. MAX A to wash face bed level (spouse provides assist). No overt LOB during transfer, short shuffled steps and pt unable to correct with cues. Anticipate pt requiring up to MAX A for LB ADLs. Pt would benefit from skilled OT services to address noted impairments and functional limitations (see below for any additional details) in order to maximize safety and independence while minimizing falls risk and caregiver burden. Anticipate the need for follow up [facility] OT services upon acute hospital DC.      If plan is discharge home, recommend the following:   A little help with walking and/or transfers;A lot of help with bathing/dressing/bathroom;Direct supervision/assist for medications management;Direct supervision/assist for financial management;Assist for transportation;Help with stairs or ramp for entrance;Supervision due to cognitive status;Assistance with cooking/housework     Functional Status Assessment   Patient has had a recent decline in their functional status and demonstrates  the ability to make significant improvements in function in a reasonable and predictable amount of time.     Equipment Recommendations   BSC/3in1      Precautions/Restrictions   Precautions Precautions: Fall Precaution/Restrictions Comments: hx of dementia, HOH Restrictions Weight Bearing Restrictions Per Provider Order: No     Mobility Bed Mobility Overal bed mobility: Needs Assistance Bed Mobility: Supine to Sit     Supine to sit: Supervision, Used rails, HOB elevated          Transfers Overall transfer level: Needs assistance Equipment used: Rolling walker (2 wheels) Transfers: Sit to/from Stand, Bed to chair/wheelchair/BSC Sit to Stand: Contact guard assist     Step pivot transfers: Contact guard assist     General transfer comment: cues for hand placement, pt able to step pivot to recliner with small steps but no overt LOB      Balance Overall balance assessment: Needs assistance Sitting-balance support: Feet supported Sitting balance-Leahy Scale: Fair     Standing balance support: Bilateral upper extremity supported Standing balance-Leahy Scale: Fair Standing balance comment: reliant on UE support on RW                           ADL either performed or assessed with clinical judgement   ADL Overall ADL's : Needs assistance/impaired Eating/Feeding: Sitting;Set up   Grooming: Sitting;Set up   Upper Body Bathing: Sitting;Minimal assistance   Lower Body Bathing: Sit to/from stand;Maximal assistance   Upper Body Dressing : Sitting;Minimal assistance   Lower Body Dressing: Maximal assistance;Sit to/from stand   Toilet Transfer: Contact guard assist;Rolling walker (2 wheels);BSC/3in1 Toilet Transfer Details (indicate cue type and reason): simulated bed > recliner, shuffling  steps Toileting- Clothing Manipulation and Hygiene: Minimal assistance;Sit to/from stand       Functional mobility during ADLs: Contact guard assist;Rolling  walker (2 wheels) General ADL Comments: pt requires physical + cognitve assist at baseline.     Vision   Additional Comments: discharge from both eyes, baseline per spouse            Pertinent Vitals/Pain Pain Assessment Pain Assessment: No/denies pain     Extremity/Trunk Assessment Upper Extremity Assessment Upper Extremity Assessment: Generalized weakness   Lower Extremity Assessment Lower Extremity Assessment: Generalized weakness   Cervical / Trunk Assessment Cervical / Trunk Assessment: Normal   Communication Communication Communication: Impaired Factors Affecting Communication: Hearing impaired   Cognition Arousal: Alert Behavior During Therapy: WFL for tasks assessed/performed Cognition: History of cognitive impairments             OT - Cognition Comments: HOH, hx of dementia                 Following commands: Intact       Cueing  General Comments      VSS on RA, MD in room during session   Exercises Other Exercises Other Exercises: discussed role of OT in acute setting        Home Living Family/patient expects to be discharged to:: Private residence Living Arrangements: Spouse/significant other Available Help at Discharge: Family;Available 24 hours/day Type of Home: House Home Access: Stairs to enter [employer] of Steps: 3 Entrance Stairs-Rails: Left Home Layout: Two level;Able to live on main level with bedroom/bathroom Alternate Level Stairs-Number of Steps: 11   Bathroom Shower/Tub: Walk-in Human resources officer: Handicapped height     Home Equipment: Agricultural consultant (2 wheels);Cane - single point;Shower seat - built in;Hand held shower head   Additional Comments: was not using AD prior to admission      Prior Functioning/Environment Prior Level of Function : Driving;Independent/Modified Independent             Mobility Comments: for the last several weeks has needed a SPC and then a RW ADLs  Comments: family assists with all ADLs, physically and cognitively    OT Problem List: Decreased strength;Decreased range of motion;Decreased activity tolerance;Impaired balance (sitting and/or standing);Decreased cognition;Decreased safety awareness;Decreased knowledge of use of DME or AE;Decreased knowledge of precautions   OT Treatment/Interventions: Self-care/ADL training;Therapeutic exercise;Neuromuscular education;Energy conservation;DME and/or AE instruction;Therapeutic activities;Cognitive remediation/compensation;Patient/family education;Balance training      OT Goals(Current goals can be found in the care plan section)   Acute Rehab OT Goals OT Goal Formulation: With patient/family Time For Goal Achievement: 07/15/24 Potential to Achieve Goals: Fair ADL Goals Pt Will Perform Grooming: sitting;standing;with contact guard assist Pt Will Perform Upper Body Dressing: with min assist;sitting Pt Will Perform Lower Body Dressing: with min assist;sit to/from stand Pt Will Transfer to Toilet: with contact guard assist;ambulating;bedside commode;grab bars Pt Will Perform Toileting - Clothing Manipulation and hygiene: with contact guard assist;sitting/lateral leans   OT Frequency:  Min 2X/week       AM-PAC OT 6 Clicks Daily Activity     Outcome Measure Help from another person eating meals?: None Help from another person taking care of personal grooming?: A Little Help from another person toileting, which includes using toliet, bedpan, or urinal?: A Lot Help from another person bathing (including washing, rinsing, drying)?: A Lot Help from another person to put on and taking off regular upper body clothing?: A Lot Help from another person to put on and taking  off regular lower body clothing?: A Lot 6 Click Score: 15   End of Session Equipment Utilized During Treatment: Rolling walker (2 wheels);Gait belt Nurse Communication: Mobility status  Activity Tolerance: Patient  tolerated treatment well Patient left: in chair;with call bell/phone within reach;with chair alarm set;with family/visitor present  OT Visit Diagnosis: Unsteadiness on feet (R26.81);Repeated falls (R29.6);Muscle weakness (generalized) (M62.81);History of falling (Z91.81);Other symptoms and signs involving cognitive function                Time: 9149-9087 OT Time Calculation (min): 22 min Charges:  OT General Charges $OT Visit: 1 Visit OT Evaluation $OT Eval Low Complexity: 1 Low Keirstyn Aydt L. Galya Dunnigan, OTR/L  07/01/24, 10:12 AM

## 2024-07-01 NOTE — Progress Notes (Addendum)
`  PHARMACY - ANTICOAGULATION CONSULT NOTE  Pharmacy Consult for warfarin Indication: Hx of DVT (2020)   No Known Allergies  Patient Measurements: Height: 5' 7 (170.2 cm) Weight: 75.6 kg (166 lb 10.7 oz) IBW/kg (Calculated) : 66.1 HEPARIN  DW (KG): 74.8  Vital Signs: Temp: 97.8 F (36.6 C) (07/02 0414) Temp Source: Oral (07/01 1936) BP: 135/64 (07/02 0414) Pulse Rate: 57 (07/02 0414)  Labs: Recent Labs    06/29/24 1657 06/29/24 1812 06/29/24 2117 06/30/24 0505 07/01/24 0404  HGB 15.3  --   --  13.5  --   HCT 45.4  --   --  40.1  --   PLT 213  --   --  186  --   LABPROT  --   --  23.2* 24.6* 25.1*  INR  --   --  2.0* 2.1* 2.2*  CREATININE  --  2.31*  --  1.86*  --     Estimated Creatinine Clearance: 24.7 mL/min (A) (by C-G formula based on SCr of 1.86 mg/dL (H)).   Medical History: Past Medical History:  Diagnosis Date   Chronic kidney disease    Clotting disorder (HCC)    Dementia (HCC)    Heart murmur    Skin cancer     Medications:  Scheduled:   amLODipine   2.5 mg Oral Daily   ascorbic acid   1,000 mg Oral Daily   cyanocobalamin   1,000 mcg Oral Q0600   donepezil   10 mg Oral QHS   gabapentin   200 mg Oral QHS   loratadine   10 mg Oral Daily   tamsulosin  0.4 mg Oral QPC supper   Warfarin - Pharmacist Dosing Inpatient   Does not apply q1600   Assessment: 89 y/o male presenting with hypercalcemia. PMH significant for CKD stage IV, hypertension, dementia, BPH, DVT on Coumadin , skin cancer. Pharmacy has been consulted to resume and manage warfarin.    Warfarin regimen: 5 mg daily (total weekly dose of 35 mg) Last warfarin dose prior to admission: 5 mg on 06/28/24 @ 1800  Baseline labs: INR 2.0, hgb 15.3, plt 213  Date INR Warfarin Dose 06/30 2.0 5 mg 07/01 2.1 5 mg 07/02 2.2  Goal of Therapy:  INR 2-3 Monitor platelets by anticoagulation protocol: Yes   Plan: INR continues to be therapeutic Warfarin 5 mg PO daily Monitor daily INR while  inpatient Monitor CBC at least weekly  Thank you for involving pharmacy in this patient's care.   Will M. Lenon, PharmD Clinical Pharmacist 07/01/2024 7:27 AM

## 2024-07-01 NOTE — Progress Notes (Signed)
 Physical Therapy Treatment Patient Details Name: Gregg Thomas MRN: [id number] DOB: 03-08-1931 Today's Date: 07/01/2024   History of Present Illness Pt is a 89 year old male with CKD stage IV, hypertension, dementia, BPH, DVT on Coumadin , skin cancer, hard of hearing, who presents with fatigue, gait imbalance, and abnormal labs with hypercalcemia.    PT Comments  Pt is supine in bed with HOB elevated, very kind and agreeable to therapy today. PT focused today's session on increasing ambulation distance, activity tolerance, and BLE strengthening. During session pt was able to perform bed mobility with SBA,CGA, performed BLE strengthening exercises while supine, performed STS from lowest bed height with CGA and was able to ambulate 180 ft with RW and CGA to increase safety. During ambulation, pt presented with a parkinsonian gait pattern, with short rapid steps, but has adequate foot clearance. Pt would benefit from skilled PT to continue to work towards PT goals and return to PLOF.  Pre session: HR: 70 bpm, SpO2: 93% on room air,  Post session: HR: 63 bpm, SpO2: 94% on room air.    If plan is discharge home, recommend the following: A little help with walking and/or transfers;A little help with bathing/dressing/bathroom;Assistance with cooking/housework;Assist for transportation;Help with stairs or ramp for entrance   Can travel by private vehicle        Equipment Recommendations  BSC/3in1    Recommendations for Other Services       Precautions / Restrictions Precautions Precautions: Fall Recall of Precautions/Restrictions: Intact Precaution/Restrictions Comments: hx of dementia, HOH Restrictions Weight Bearing Restrictions Per Provider Order: No     Mobility  Bed Mobility Overal bed mobility: Needs Assistance Bed Mobility: Supine to Sit     Supine to sit: Supervision, CGA Used rails, HOB elevated Sit to supine: Supervision, CGA Used rails   General bed mobility  comments: Increased time to complete the movement    Transfers Overall transfer level: Needs assistance Equipment used: Rolling walker (2 wheels) Transfers: Sit to/from Stand Sit to Stand: Contact guard assist           General transfer comment: Vc's for hand placement on bed to initiate STS from bed height at lowest setting    Ambulation/Gait Ambulation/Gait assistance: Contact guard assist, +2 safety/equipment (w/c follow) Gait Distance (Feet): 180 Feet Assistive device: Rolling walker (2 wheels)   Gait velocity: decreased     General Gait Details: Parkinsonian gait pattern, short rapid steps, but has foot clearance       Balance Overall balance assessment: Needs assistance Sitting-balance support: Feet supported, Bilateral upper extremity supported Sitting balance-Leahy Scale: Good Sitting balance - Comments: Steady sitting balance, reaching within BOS   Standing balance support: Bilateral upper extremity supported, Reliant on assistive device for balance, During functional activity Standing balance-Leahy Scale: Fair Standing balance comment: reliant on UE support on RW                            Communication Communication Communication: Impaired Factors Affecting Communication: Hearing impaired  Cognition Arousal: Alert Behavior During Therapy: WFL for tasks assessed/performed   PT - Cognitive impairments: History of cognitive impairments                       PT - Cognition Comments: pt oriented to self and family, pt family endorsed this is baseline, extremely pleasant Following commands: Intact      Cueing Cueing Techniques: Verbal cues, Gestural cues  Exercises General  Exercises - Lower Extremity Ankle Circles/Pumps: AROM, Both, 10 reps, Strengthening, Supine Straight Leg Raises: AROM, Strengthening, Both, 10 reps, Supine    General Comments General comments (skin integrity, edema, etc.): Pre session: HR: 63 bpm, SpO2: 92% on  room air,  Post session: HR: 70 bpm, SpO2: 93 % on room air.      Pertinent Vitals/Pain Pain Assessment Pain Assessment: No/denies pain    Home Living   Prior Function    PT Goals (current goals can now be found in the care plan section) Acute Rehab PT Goals Patient Stated Goal: to get his strength back PT Goal Formulation: With family Time For Goal Achievement: 07/14/24 Potential to Achieve Goals: Good Progress towards PT goals: Progressing toward goals    Frequency    Min 3X/week       AM-PAC PT 6 Clicks Mobility   Outcome Measure  Help needed turning from your back to your side while in a flat bed without using bedrails?: None Help needed moving from lying on your back to sitting on the side of a flat bed without using bedrails?: None Help needed moving to and from a bed to a chair (including a wheelchair)?: A Little Help needed standing up from a chair using your arms (e.g., wheelchair or bedside chair)?: A Little Help needed to walk in hospital room?: A Little Help needed climbing 3-5 steps with a railing? : A Little 6 Click Score: 20    End of Session Equipment Utilized During Treatment: Gait belt Activity Tolerance: Patient tolerated treatment well Patient left: in bed;with call bell/phone within reach;with family/visitor present;with nursing/sitter in room (RN present in room for bath) Nurse Communication: Mobility status PT Visit Diagnosis: Other abnormalities of gait and mobility (R26.89);Difficulty in walking, not elsewhere classified (R26.2);Muscle weakness (generalized) (M62.81)     Time: 8646-8578 PT Time Calculation (min) (ACUTE ONLY): 28 min  Charges:                           Tariq Pernell, SPT 07/01/24, 3:53 PM

## 2024-07-01 NOTE — Progress Notes (Signed)
 PROGRESS NOTE Gregg Thomas  FMW:[id number] DOB: 08/11/1931 DOA: 06/29/2024 PCP: Gregg Thomas SQUIBB, DO  Chief Complaint  Patient presents with   abnormal labs   Hospital Course:  Gregg Thomas is a 89 year old male with CKD stage IV, hypertension, dementia, BPH, DVT on Coumadin , skin cancer, hard of hearing, who presents with fatigue, gait imbalance, and abnormal labs with hypercalcemia.  Patient has been following with Dr. Marcelino of nephrology who had lab work performed on 6/24 which revealed calcium of 12.2.  It was recommended he present to the ED for further evaluation.  There was concern for undiagnosed malignancy. CT abdomen pelvis reveals prior granulomatous disease and stable nodule.  CT abdomen pelvis without acute findings. He is medically cleared for dc. PT/OT recommend HH.  Subjective: Pt reports feeling well today. Reports no complaints.   Objective: Vitals:   06/30/24 1936 07/01/24 0005 07/01/24 0414 07/01/24 0500  BP: 124/75 (!) 167/71 135/64   Pulse: 68 62 (!) 57   Resp: 16 18 18    Temp: 97.8 F (36.6 C) 97.7 F (36.5 C) 97.8 F (36.6 C)   TempSrc: Oral     SpO2: 91% 91% 90%   Weight:    75.6 kg  Height:        Intake/Output Summary (Last 24 hours) at 07/01/2024 0719 Last data filed at 07/01/2024 0616 Gross per 24 hour  Intake 1342.13 ml  Output 1000 ml  Net 342.13 ml   Filed Weights   06/29/24 1635 07/01/24 0500  Weight: 74.8 kg 75.6 kg   Examination: General exam: Appears calm and comfortable, NAD  Respiratory system: No increased work of breathing, symmetric chest wall expansion Cardiovascular system: S1 & S2 heard, RRR.  Gastrointestinal system: Abdomen is nondistended, soft and nontender.  Neuro: Alert, disoriented.  Oriented to self and wife and situational  Extremities: Symmetric, expected ROM Skin: No rashes, lesions Psychiatry: mood and affect congruent  Assessment & Plan:  Principal Problem:   Hypercalcemia Active  Problems:   CKD (chronic kidney disease) stage 4, GFR 15-29 ml/min (HCC)   Benign prostatic hyperplasia with nocturia   HTN (hypertension)   Chronic deep vein thrombosis (DVT) of femoral vein of left lower extremity (HCC)   Dementia (HCC)   Overweight (BMI 25.0-29.9)  Hypercalcemia- Calcium 12.1 on arrival>>10.6 s/p pamidronate 90 mg x 1 6/30 - Calcium downtrending now -- PTH pending - Further correction per nephrology - No evidence of malignancy on scans.  Does have granulomatous disease which may be playing a role  CKD IV -Creatinine appears to be at baseline. Cr 1.89 - Nephrology consulted as above - Continue to renally dose medications with a creatinine clearance of 24  BPH with nocturia- has good UOP - Resume home meds  HTN - Resume home meds  Chronic DVT of femoral vein of left lower extremity - On Coumadin , pharmacy consulted to assist in management  Dementia -At baseline walks with a cane, has had gradual decline over the last few weeks.  His wife reports his mental status varies.  Deconditioned -- Currently deconditioned beyond baseline.  Has had gradual decline over the last 1 month.  Perhaps related to hypercalcemia --PT/OT evals recommend HH.   Overweight  BMI 25 -- Outpatient follow up for lifestyle modification and risk factor management  DVT prophylaxis: warfarin   Code Status: Limited: Do not attempt resuscitation (DNR) -DNR-LIMITED -Do Not Intubate/DNI   Disposition:  awaiting dc due to patient wife wanting him to get stronger before he discharges  home  Consultants:  Treatment Team:  Consulting Physician: Lateef, Munsoor, MD  Procedures:  None   Antimicrobials:  Anti-infectives (From admission, onward)    None       Data Reviewed: I have personally reviewed following labs and imaging studies CBC: Recent Labs  Lab 06/29/24 1657 06/30/24 0505  WBC 6.3 4.6  HGB 15.3 13.5  HCT 45.4 40.1  MCV 99.1 98.3  PLT 213 186   Basic Metabolic  Panel: Recent Labs  Lab 06/29/24 1812 06/30/24 0505  NA 140 140  K 5.0 3.9  CL 109 110  CO2 24 23  GLUCOSE 102* 91  BUN 31* 29*  CREATININE 2.31* 1.86*  CALCIUM 12.1* 11.1*  MG 2.3  --   PHOS 2.8  --    GFR: Estimated Creatinine Clearance: 24.7 mL/min (A) (by C-G formula based on SCr of 1.86 mg/dL (H)).  Radiology Studies: CT CHEST ABDOMEN PELVIS WO CONTRAST Result Date: 06/29/2024 CLINICAL DATA:  Unintended weight loss EXAM: CT CHEST, ABDOMEN AND PELVIS WITHOUT CONTRAST TECHNIQUE: Multidetector CT imaging of the chest, abdomen and pelvis was performed following the standard protocol without IV contrast. RADIATION DOSE REDUCTION: This exam was performed according to the departmental dose-optimization program which includes automated exposure control, adjustment of the mA and/or kV according to patient size and/or use of iterative reconstruction technique. COMPARISON:  11/03/12 FINDINGS: CT CHEST FINDINGS Cardiovascular: Atherosclerotic calcifications of the aorta are noted. No aneurysmal dilatation is seen. Coronary calcifications are noted. No cardiac enlargement is seen. Mediastinum/Nodes: Thoracic inlet is within normal limits. No hilar or mediastinal adenopathy is noted. The esophagus as visualized is within normal limits. Lungs/Pleura: Lungs are well aerated bilaterally. Mild atelectatic changes are noted in the lower lobes bilaterally. No focal effusion is noted. Calcified granuloma is noted in the left upper lobe. Additionally a 5 mm nodule is noted in the lingula but stable from the prior exam. No further follow-up is recommended. Musculoskeletal: Degenerative changes of the thoracic spine are noted. CT ABDOMEN PELVIS FINDINGS Hepatobiliary: No focal liver abnormality is seen. No gallstones, gallbladder wall thickening, or biliary dilatation. Pancreas: Unremarkable. No pancreatic ductal dilatation or surrounding inflammatory changes. Spleen: Normal in size without focal abnormality.  Adrenals/Urinary Tract: Adrenal glands are within normal limits. Kidneys are well visualized bilaterally. No renal calculi or obstructive changes are seen. Simple cyst is noted in the lower pole of right kidney. No further follow-up is recommended. Ureters are within normal limits. The bladder is partially distended. Stomach/Bowel: Scattered diverticular change of the colon is noted without evidence of diverticulitis. The appendix is within normal limits. Small bowel and stomach are unremarkable. Vascular/Lymphatic: Aortic atherosclerosis. No enlarged abdominal or pelvic lymph nodes. Reproductive: Prostate is unremarkable. Other: No abdominal wall hernia or abnormality. No abdominopelvic ascites. Musculoskeletal: No acute or significant osseous findings. IMPRESSION: CT of the chest: Basilar atelectatic changes. Findings of prior granulomatous disease. 5 mm nodule in the lingula stable from 2013. No follow-up is recommended given its long-term stability. CT of the abdomen and pelvis: Diverticulosis without diverticulitis. Simple renal cyst on the right.  No follow-up is recommended. Electronically Signed   By: Oneil Devonshire M.D.   On: 06/29/2024 20:12    Scheduled Meds:  amLODipine   2.5 mg Oral Daily   ascorbic acid   1,000 mg Oral Daily   cyanocobalamin   1,000 mcg Oral Q0600   donepezil   10 mg Oral QHS   gabapentin   200 mg Oral QHS   loratadine   10 mg Oral Daily  tamsulosin  0.4 mg Oral QPC supper   Warfarin - Pharmacist Dosing Inpatient   Does not apply q1600     LOS: 0 days   [name], DO [facility]  To contact the attending physician between 7A-7P please use Epic Chat. To contact the covering physician during after hours 7P-7A, please review Amion.  07/01/2024, 7:19 AM

## 2024-07-01 NOTE — Progress Notes (Signed)
   07/01/24 0818  TOC Brief Assessment  Insurance and Status Reviewed  Patient has primary care physician Yes (JOHNSON, MEGAN P)  Home environment has been reviewed From home  Prior level of function: Independent  Prior/Current Home Services No current home services  Social Drivers of Health Review SDOH reviewed no interventions necessary (Not documented at this time)  Readmission risk has been reviewed Yes  Transition of care needs no transition of care needs at this time      Transition of Care Department [facility]) has reviewed patient and no TOC needs have been identified at this time. We will continue to monitor patient advancement through interdisciplinary progression rounds. If new patient transition needs arise, please place a TOC consult.

## 2024-07-01 NOTE — Plan of Care (Signed)
  Problem: Education: Goal: Knowledge of General Education information will improve Description: Including pain rating scale, medication(s)/side effects and non-pharmacologic comfort measures Outcome: Progressing   Problem: Clinical Measurements: Goal: Diagnostic test results will improve Outcome: Progressing   Problem: Nutrition: Goal: Adequate nutrition will be maintained Outcome: Progressing   Problem: Safety: Goal: Ability to remain free from injury will improve Outcome: Progressing

## 2024-07-01 NOTE — Progress Notes (Signed)
 Mobility Specialist - Progress Note  Post-mobility: SPO2 93%   07/01/24 0955  Mobility  Activity Ambulated with assistance in hallway;Ambulated with assistance in room  Level of Assistance Contact guard assist, steadying assist  Assistive Device Front wheel walker  Distance Ambulated (ft) 40 ft  Activity Response Tolerated well  Mobility visit 1 Mobility  Mobility Specialist Start Time (ACUTE ONLY) [id number]  Mobility Specialist Stop Time (ACUTE ONLY) 0950  Mobility Specialist Time Calculation (min) (ACUTE ONLY) 11 min   Pt sitting in the recliner upon entry, utilizing RA. Pt agreeable to OOB amb this date, expressed feeing well today. Pt STS to RW SBA-MinG-- no hands on assist required. Pt amb ~ 40 ft in the hallway (water fountain and back) CGA-MinG, denies SOB or fatigue--- O2 86% upon return to the room. Pt seated in the recliner, O2 93% after >1 min seated rest. Pt left seated with alarm set and needs within reach. Family member present at bedside, RN notified.  Gregg Thomas Mobility Specialist 07/01/24 10:03 AM

## 2024-07-02 DIAGNOSIS — Z7189 Other specified counseling: Secondary | ICD-10-CM | POA: Diagnosis not present

## 2024-07-02 LAB — PROTIME-INR
INR: 2.4 — ABNORMAL HIGH (ref 0.8–1.2)
Prothrombin Time: 27.6 s — ABNORMAL HIGH (ref 11.4–15.2)

## 2024-07-02 LAB — PARATHYROID HORMONE, INTACT (NO CA): PTH: 131 pg/mL — ABNORMAL HIGH (ref 15–65)

## 2024-07-02 LAB — CALCIUM, URINE, RANDOM: Calcium, Ur: 22.5 mg/dL

## 2024-07-02 MED ORDER — TAMSULOSIN HCL 0.4 MG PO CAPS: 0.4000 mg | ORAL_CAPSULE | Freq: Every day | ORAL | 0 refills | Status: DC

## 2024-07-02 NOTE — Progress Notes (Signed)
 [facility]  ROUNDING NOTE   Subjective:   Gregg Thomas is a 89 year old male with past medical conditions including hypertension, dementia, DVT on Coumadin , skin cancer, hard of hearing, and chronic kidney disease stage IV.  Patient presents to the emergency department at the advice of his nephrologist due to abnormal labs.  Patient has been admitted for Hypercalcemia [E83.52] Hyperglycemia [R73.9]  Patient is known to our practice and is followed by Dr. Marcelino outpatient.  Patient was last seen in the office on 6/30 and was found to have an abnormally elevated calcium level.  Wife denies any excessive calcium intake such as milk, ice cream, or Tums.  Denies any other supplements.  States patient was recently treated with Cipro  for UTI.  States his behavior change while taking that medication.  Also began to have more weakness and confusion.  Labs on ED arrival consisted of potassium 5.0, BUN 31, creatinine 2.31, calcium 12.1.  UA appears clear with some bacteria.  CT abdomen pelvis negative for renal obstruction or mass.  Update: Patient resting comfortably in bed. Creatinine down to 1.8 and serum calcium down to 10.6.   Objective:  Vital signs in last 24 hours:  Temp:  [98 F (36.7 C)-99.3 F (37.4 C)] 99.3 F (37.4 C) (07/03 0730) Pulse Rate:  [57-68] 63 (07/03 0730) Resp:  [17-18] 18 (07/03 0730) BP: (117-149)/(60-68) 117/60 (07/03 0914) SpO2:  [90 %-95 %] 90 % (07/03 0730)  Weight change:  Filed Weights   06/29/24 1635 07/01/24 0500  Weight: 74.8 kg 75.6 kg    Intake/Output: I/O last 3 completed shifts: In: 360 [P.O.:360] Out: 1325 [Urine:1325]   Intake/Output this shift:  Total I/O In: -  Out: 400 [Urine:400]  Physical Exam: General: NAD  Head: Normocephalic, atraumatic. Moist oral mucosal membranes  Eyes: Anicteric  Neck: Supple  Lungs:  Clear to auscultation, normal effort, room air  Heart: Regular rate and rhythm  Abdomen:  Soft,  nontender  Extremities: No peripheral edema.  Neurologic: Awake, alert  Skin: Warm,dry, no rash  Access: None    Basic Metabolic Panel: Recent Labs  Lab 06/29/24 1812 06/30/24 0505 07/01/24 0404  NA 140 140 139  K 5.0 3.9 4.2  CL 109 110 111  CO2 24 23 22   GLUCOSE 102* 91 82  BUN 31* 29* 29*  CREATININE 2.31* 1.86* 1.89*  CALCIUM 12.1* 11.1* 10.6*  MG 2.3  --   --   PHOS 2.8  --   --     Liver Function Tests: No results for input(s): AST, ALT, ALKPHOS, BILITOT, PROT, ALBUMIN in the last 168 hours. No results for input(s): LIPASE, AMYLASE in the last 168 hours. No results for input(s): AMMONIA in the last 168 hours.  CBC: Recent Labs  Lab 06/29/24 1657 06/30/24 0505  WBC 6.3 4.6  HGB 15.3 13.5  HCT 45.4 40.1  MCV 99.1 98.3  PLT 213 186    Cardiac Enzymes: No results for input(s): CKTOTAL, CKMB, CKMBINDEX, TROPONINI in the last 168 hours.  BNP: Invalid input(s): POCBNP  CBG: No results for input(s): GLUCAP in the last 168 hours.  Microbiology: Results for orders placed or performed in visit on 06/23/24  Urine Culture     Status: None   Collection Time: 06/23/24  2:19 AM   Specimen: Blood   UR  Result Value Ref Range Status   Urine Culture, Routine Final report  Final   Organism ID, Bacteria No growth  Final  Microscopic Examination  Status: None   Collection Time: 06/23/24  2:15 PM   Urine  Result Value Ref Range Status   WBC, UA 0-5 0 - 5 /hpf Final   RBC, Urine 0-2 0 - 2 /hpf Final   Epithelial Cells (non renal) 0-10 0 - 10 /hpf Final   Bacteria, UA Few None seen/Few Final    Coagulation Studies: Recent Labs    06/29/24 July 27, 2116 06/30/24 0505 07/01/24 0404 07/02/24 0406  LABPROT 23.2* 24.6* 25.1* 27.6*  INR 2.0* 2.1* 2.2* 2.4*    Urinalysis: Recent Labs    06/29/24 1707  COLORURINE YELLOW*  LABSPEC 1.017  PHURINE 5.0  GLUCOSEU NEGATIVE  HGBUR NEGATIVE  BILIRUBINUR NEGATIVE  KETONESUR NEGATIVE   PROTEINUR 100*  NITRITE NEGATIVE  LEUKOCYTESUR NEGATIVE      Imaging: No results found.    Medications:      amLODipine   2.5 mg Oral Daily   ascorbic acid   1,000 mg Oral Daily   cyanocobalamin   1,000 mcg Oral Q0600   donepezil   10 mg Oral QHS   gabapentin   200 mg Oral QHS   loratadine   10 mg Oral Daily   tamsulosin  0.4 mg Oral QPC supper   warfarin  5 mg Oral q1600   Warfarin - Pharmacist Dosing Inpatient   Does not apply q1600   acetaminophen , albuterol , dextromethorphan-guaiFENesin , ondansetron  (ZOFRAN ) IV  Assessment/ Plan:  Gregg Thomas is a 89 y.o.  male with past medical conditions including hypertension, dementia, DVT on Coumadin , skin cancer, hard of hearing, and chronic kidney disease stage IV.  Patient presents to the emergency department at the advice of his nephrologist due to abnormal labs.  Patient has been admitted for Hypercalcemia [E83.52] Hyperglycemia [R73.9]   Hypercalcemia, etiology unclear.  Presenting calcium 12.1.  has improved to 10.6 since admission.  Denies excessive calcium intake or supplements.  . Recent treatment for UTI however Cipro  usually does not have this effect.  Patient given pamidronate at admission.  Currently off IV fluid hydration.  Was found to have prior granulomatous disease which may be playing some role in hypercalcemia.  May need endocrine evaluation as outpatient.  We will continue to monitor serum calcium as outpatient as well.  Acute Kidney Injury on chronic kidney disease stage IV with baseline creatinine 1.8. Acute kidney injury secondary to poor oral intake and recent infection as well as hypercalcemia. Renal function has returned to baseline with IV fluids.   Lab Results  Component Value Date   CREATININE 1.89 (H) 07/01/2024   CREATININE 1.86 (H) 06/30/2024   CREATININE 2.31 (H) 06/29/2024    Intake/Output Summary (Last 24 hours) at 07/02/2024 1311 Last data filed at 07/02/2024 1221 Gross per 24 hour   Intake 240 ml  Output 1725 ml  Net -1485 ml    3. Anemia of chronic kidney disease Lab Results  Component Value Date   HGB 13.5 06/30/2024    Hemoglobin stable at 13.5.  4. Hypertension with chronic kidney disease. Home regimen includes amlodipine    LOS: 0 Markeise Mathews 7/3/20251:11 PM

## 2024-07-02 NOTE — Discharge Summary (Signed)
 Physician Discharge Summary  Patient: Gregg Thomas FMW:[id number] DOB: 10/26/1931   Code Status: Limited: Do not attempt resuscitation (DNR) -DNR-LIMITED -Do Not Intubate/DNI  Admit date: 06/29/2024 Discharge date: 07/02/2024 Disposition: Home health, PT, OT, SLP, and nurse aid PCP: [name], DO  Recommendations for Outpatient Follow-up:  Follow up with PCP within 1-2 weeks Regarding general hospital follow up and preventative care Recommend repeat labs Follow up with nephrology in 2-3 weeks  Discharge Diagnoses:  Principal Problem:   Hypercalcemia Active Problems:   CKD (chronic kidney disease) stage 4, GFR 15-29 ml/min (HCC)   Benign prostatic hyperplasia with nocturia   HTN (hypertension)   Chronic deep vein thrombosis (DVT) of femoral vein of left lower extremity (HCC)   Dementia (HCC)   Overweight (BMI 25.0-29.9)  Brief Hospital Course Summary: Gregg Thomas is a 89 year old male with CKD stage IV, hypertension, dementia, BPH, DVT on Coumadin , skin cancer, hard of hearing, who presents with fatigue, gait imbalance, and abnormal labs with hypercalcemia.  Patient has been following with Dr. Marcelino of nephrology who had lab work performed on 6/24 which revealed calcium of 12.2.  It was recommended he present to the ED for further evaluation.  There was concern for undiagnosed malignancy. CT abdomen pelvis reveals prior granulomatous disease and stable nodule likely source of hypercalcemia.  CT abdomen pelvis without acute findings. He received 1 dose of pamidronate  on admission and no further treatments.  Palliative was consulted and made no changes.  He is medically cleared for dc. PT/OT recommend HH.  All other chronic conditions were treated with home medications.    Discharge Condition: Stable, improved Recommended discharge diet: Regular healthy diet  Consultations: Nephrology  Palliative care  Procedures/Studies: none  Discharge  Instructions     Ambulatory referral to Speech Therapy   Complete by: As directed    For speech therapy at [facility], verify address and phone number.  [facility] will contact family to schedule appointment. For referral to a different hospital or provider, change to External referral, note which hospital/provider is desired.  Put in checkout note to send patient to referral coordinator.      Allergies as of 07/02/2024   No Known Allergies      Medication List     TAKE these medications    albuterol  108 (90 Base) MCG/ACT inhaler Commonly known as: VENTOLIN  HFA Inhale 2 puffs into the lungs every 6 (six) hours as needed for wheezing or shortness of breath.   amLODipine  2.5 MG tablet Commonly known as: NORVASC  Take 1 tablet (2.5 mg total) by mouth daily.   ascorbic acid  1000 MG tablet Commonly known as: VITAMIN C  Take by mouth.   cetirizine  5 MG tablet Commonly known as: ZYRTEC  Take 1 tablet (5 mg total) by mouth daily.   donepezil  10 MG tablet Commonly known as: ARICEPT  Take 1 tablet (10 mg total) by mouth at bedtime.   ELDERBERRY PO Take 1 tablet by mouth daily.   fluticasone  50 MCG/ACT nasal spray Commonly known as: FLONASE  Place 2 sprays into both nostrils daily.   gabapentin  100 MG capsule Commonly known as: NEURONTIN  Take 2 capsules (200 mg total) by mouth at bedtime.   Grape Seed Extract 50 MG Caps Take 1,300 mg by mouth daily.   Ipratropium-Albuterol  20-100 MCG/ACT Aers respimat Commonly known as: COMBIVENT  Inhale 1 puff into the lungs every 6 (six) hours.   montelukast  10 MG tablet Commonly known as: SINGULAIR  Take 1 tablet (10 mg  total) by mouth at bedtime.   tamsulosin  0.4 MG Caps capsule Commonly known as: FLOMAX  Take 1 capsule (0.4 mg total) by mouth daily after supper.   VITAMIN B-12 ER PO Take 1,000 mcg by mouth daily at 6 (six) AM.   warfarin 5 MG tablet Commonly known as: COUMADIN  Take 1 tablet (5 mg total) by mouth one  time only at 6 PM.   zinc  gluconate 50 MG tablet Take by mouth.               Durable Medical Equipment  (From admission, onward)           Start     Ordered   07/02/24 1103  For home use only DME Walker rolling  Once       Question Answer Comment  Walker: With 5 Inch Wheels   Patient needs a walker to treat with the following condition Physical deconditioning      07/02/24 1103   07/02/24 0720  For home use only DME Bedside commode  Once       Question:  Patient needs a bedside commode to treat with the following condition  Answer:  Physical deconditioning   07/02/24 0720            Follow-up Information     Vicci Bouchard P, DO Follow up.   Specialty: Family Medicine Why: hospital follow up Contact information: [address] [place] [address] [phone]                 Subjective   Pt reports feeling well and at baseline. Denies any complaints.   All questions and concerns were addressed at time of discharge.  Objective  Blood pressure 117/60, pulse 63, temperature 99.3 F (37.4 C), temperature source Axillary, resp. rate 18, height 5' 7 (1.702 m), weight 75.6 kg, SpO2 90%.   General: Pt is alert, awake, not in acute distress Cardiovascular: RRR, S1/S2 +, no rubs, no gallops Respiratory: CTA bilaterally, no wheezing, no rhonchi Abdominal: Soft, NT, ND, bowel sounds + Extremities: no edema, no cyanosis  The results of significant diagnostics from this hospitalization (including imaging, microbiology, ancillary and laboratory) are listed below for reference.   Imaging studies: CT CHEST ABDOMEN PELVIS WO CONTRAST Result Date: 06/29/2024 CLINICAL DATA:  Unintended weight loss EXAM: CT CHEST, ABDOMEN AND PELVIS WITHOUT CONTRAST TECHNIQUE: Multidetector CT imaging of the chest, abdomen and pelvis was performed following the standard protocol without IV contrast. RADIATION DOSE REDUCTION: This exam was performed according to the departmental  dose-optimization program which includes automated exposure control, adjustment of the mA and/or kV according to patient size and/or use of iterative reconstruction technique. COMPARISON:  11/03/12 FINDINGS: CT CHEST FINDINGS Cardiovascular: Atherosclerotic calcifications of the aorta are noted. No aneurysmal dilatation is seen. Coronary calcifications are noted. No cardiac enlargement is seen. Mediastinum/Nodes: Thoracic inlet is within normal limits. No hilar or mediastinal adenopathy is noted. The esophagus as visualized is within normal limits. Lungs/Pleura: Lungs are well aerated bilaterally. Mild atelectatic changes are noted in the lower lobes bilaterally. No focal effusion is noted. Calcified granuloma is noted in the left upper lobe. Additionally a 5 mm nodule is noted in the lingula but stable from the prior exam. No further follow-up is recommended. Musculoskeletal: Degenerative changes of the thoracic spine are noted. CT ABDOMEN PELVIS FINDINGS Hepatobiliary: No focal liver abnormality is seen. No gallstones, gallbladder wall thickening, or biliary dilatation. Pancreas: Unremarkable. No pancreatic ductal dilatation or surrounding inflammatory changes. Spleen: Normal  in size without focal abnormality. Adrenals/Urinary Tract: Adrenal glands are within normal limits. Kidneys are well visualized bilaterally. No renal calculi or obstructive changes are seen. Simple cyst is noted in the lower pole of right kidney. No further follow-up is recommended. Ureters are within normal limits. The bladder is partially distended. Stomach/Bowel: Scattered diverticular change of the colon is noted without evidence of diverticulitis. The appendix is within normal limits. Small bowel and stomach are unremarkable. Vascular/Lymphatic: Aortic atherosclerosis. No enlarged abdominal or pelvic lymph nodes. Reproductive: Prostate is unremarkable. Other: No abdominal wall hernia or abnormality. No abdominopelvic ascites.  Musculoskeletal: No acute or significant osseous findings. IMPRESSION: CT of the chest: Basilar atelectatic changes. Findings of prior granulomatous disease. 5 mm nodule in the lingula stable from 2013. No follow-up is recommended given its long-term stability. CT of the abdomen and pelvis: Diverticulosis without diverticulitis. Simple renal cyst on the right.  No follow-up is recommended. Electronically Signed   By: Oneil Devonshire M.D.   On: 06/29/2024 20:12   CT HEAD WO CONTRAST ( ) Result Date: 06/10/2024 CLINICAL DATA:  Mental status change, unknown cause confusion, gait changes, r/o stroke, dementia EXAM: CT HEAD WITHOUT CONTRAST TECHNIQUE: Contiguous axial images were obtained from the base of the skull through the vertex without intravenous contrast. RADIATION DOSE REDUCTION: This exam was performed according to the departmental dose-optimization program which includes automated exposure control, adjustment of the mA and/or kV according to patient size and/or use of iterative reconstruction technique. COMPARISON:  05/01/2014 FINDINGS: Brain: Slight progressive cerebral atrophy pattern and chronic white matter microvascular ischemic changes throughout both cerebral hemispheres. No acute intracranial hemorrhage, infarction, mass lesion midline shift, herniation, hydrocephalus, or collection. No mass effect or edema. Cisterns are patent. Cerebellar atrophy as well. Vascular: No hyperdense vessel or unexpected calcification. Skull: Normal. Negative for fracture or focal lesion. Sinuses/Orbits: No acute finding. Other: None. IMPRESSION: 1. Progressive cerebral and cerebellar atrophy and chronic white matter microvascular ischemic changes. 2. No acute intracranial finding by noncontrast CT. Electronically Signed   By: CHRISTELLA.  Shick M.D.   On: 06/10/2024 13:52    Labs: Basic Metabolic Panel: Recent Labs  Lab 06/29/24 1812 06/30/24 0505 07/01/24 0404  NA 140 140 139  K 5.0 3.9 4.2  CL 109 110 111  CO2 24  23 22   GLUCOSE 102* 91 82  BUN 31* 29* 29*  CREATININE 2.31* 1.86* 1.89*  CALCIUM 12.1* 11.1* 10.6*  MG 2.3  --   --   PHOS 2.8  --   --    CBC: Recent Labs  Lab 06/29/24 1657 06/30/24 0505  WBC 6.3 4.6  HGB 15.3 13.5  HCT 45.4 40.1  MCV 99.1 98.3  PLT 213 186   Microbiology: Results for orders placed or performed in visit on 06/23/24  Urine Culture     Status: None   Collection Time: 06/23/24  2:19 AM   Specimen: Blood   UR  Result Value Ref Range Status   Urine Culture, Routine Final report  Final   Organism ID, Bacteria No growth  Final  Microscopic Examination     Status: None   Collection Time: 06/23/24  2:15 PM   Urine  Result Value Ref Range Status   WBC, UA 0-5 0 - 5 /hpf Final   RBC, Urine 0-2 0 - 2 /hpf Final   Epithelial Cells (non renal) 0-10 0 - 10 /hpf Final   Bacteria, UA Few None seen/Few Final    Time coordinating discharge: Over 30 minutes  [name], MD  [facility] 07/02/2024, 12:26 PM

## 2024-07-02 NOTE — Plan of Care (Signed)

## 2024-07-02 NOTE — Progress Notes (Signed)
 Occupational Therapy Treatment Patient Details Name: Bronson Bressman MRN: [id number] DOB: 11/13/1931 Today's Date: 07/02/2024   History of present illness Pt is a 89 year old male with CKD stage IV, hypertension, dementia, BPH, DVT on Coumadin , skin cancer, hard of hearing, who presents with fatigue, gait imbalance, and abnormal labs with hypercalcemia.   OT comments  Pt seen for OT tx this date. Session focused on providing pt and spouse education re: falls prevention strategies, DME recommendations, discharge recommendations for [facility] OT and purpose of HH to assist with facilitation of continued ADL performance in home environment. Pt will need a RW and BSC upon hospital discharge, will also benefit from [facility] aide to assist with decreasing caregiver burden. Pt performs bed mobility with supervision, CGA for STS from elevated bed, and step pivot to recliner using RW + CGA for safety. Pt does have Parkinson's-like stepping pattern (spouse indicated diagnosis of PD has been discussed with PCP in past) and difficulties motor planning. Pt making good progress towards goals, OT to continue POC. Discharge recommendation appropriate.       If plan is discharge home, recommend the following:  A little help with walking and/or transfers;A lot of help with bathing/dressing/bathroom;Direct supervision/assist for medications management;Direct supervision/assist for financial management;Assist for transportation;Help with stairs or ramp for entrance;Supervision due to cognitive status;Assistance with cooking/housework   Equipment Recommendations  BSC/3in1 (RW, and HH aide)       Precautions / Restrictions Precautions Precautions: Fall Recall of Precautions/Restrictions: Impaired Precaution/Restrictions Comments: hx of dementia, HOH Restrictions Weight Bearing Restrictions Per Provider Order: No       Mobility Bed Mobility Overal bed mobility: Needs Assistance Bed Mobility: Supine to Sit, Sit to  Supine     Supine to sit: Supervision, Used rails, HOB elevated Sit to supine: Supervision, Used rails   General bed mobility comments: Increased time to complete the movement    Transfers Overall transfer level: Needs assistance Equipment used: Rolling walker (2 wheels) Transfers: Sit to/from Stand Sit to Stand: Contact guard assist     Step pivot transfers: Contact guard assist     General transfer comment: Vc's for hand placement on bed to initiate STS from bed height at lowest setting, short-Parkinson's like steps, difficulties motor planning     Balance Overall balance assessment: Needs assistance Sitting-balance support: Feet supported, Bilateral upper extremity supported Sitting balance-Leahy Scale: Good     Standing balance support: Bilateral upper extremity supported, Reliant on assistive device for balance, During functional activity Standing balance-Leahy Scale: Fair Standing balance comment: reliant on UE support on RW                           ADL either performed or assessed with clinical judgement   ADL Overall ADL's : Needs assistance/impaired                                       General ADL Comments: Pt requesting to get to recliner for breakfast, wants to walk later. Session focused on DME / AE education with falls prevention     Communication Communication Communication: Impaired Factors Affecting Communication: Hearing impaired   Cognition Arousal: Alert Behavior During Therapy: WFL for tasks assessed/performed Cognition: History of cognitive impairments             OT - Cognition Comments: HOH, hx of dementia  Following commands: Intact        Cueing   Cueing Techniques: Verbal cues, Gestural cues             Pertinent Vitals/ Pain       Pain Assessment Pain Assessment: No/denies pain   Frequency  Min 2X/week        Progress Toward Goals  OT Goals(current goals can  now be found in the care plan section)  Progress towards OT goals: Progressing toward goals  Acute Rehab OT Goals OT Goal Formulation: With patient/family Time For Goal Achievement: 07/15/24 Potential to Achieve Goals: Fair  Plan         AM-PAC OT 6 Clicks Daily Activity     Outcome Measure   Help from another person eating meals?: None Help from another person taking care of personal grooming?: A Little Help from another person toileting, which includes using toliet, bedpan, or urinal?: A Lot Help from another person bathing (including washing, rinsing, drying)?: A Lot Help from another person to put on and taking off regular upper body clothing?: A Lot Help from another person to put on and taking off regular lower body clothing?: A Lot 6 Click Score: 15    End of Session Equipment Utilized During Treatment: Rolling walker (2 wheels);Gait belt  OT Visit Diagnosis: Unsteadiness on feet (R26.81);Repeated falls (R29.6);Muscle weakness (generalized) (M62.81);History of falling (Z91.81);Other symptoms and signs involving cognitive function   Activity Tolerance Patient tolerated treatment well   Patient Left in chair;with call bell/phone within reach;with chair alarm set;with family/visitor present   Nurse Communication Mobility status        Time: 9156-9086 OT Time Calculation (min): 30 min  Charges: OT General Charges $OT Visit: 1 Visit OT Treatments $Self Care/Home Management : 23-37 mins  Cal Gindlesperger L. Cameren Earnest, OTR/L  07/02/24, 11:07 AM

## 2024-07-02 NOTE — Progress Notes (Signed)
`  PHARMACY - ANTICOAGULATION CONSULT NOTE  Pharmacy Consult for warfarin Indication: Hx of DVT (2020)   No Known Allergies  Patient Measurements: Height: 5' 7 (170.2 cm) Weight: 75.6 kg (166 lb 10.7 oz) IBW/kg (Calculated) : 66.1 HEPARIN  DW (KG): 74.8  Vital Signs: Temp: 98 F (36.7 C) (07/03 0011) Temp Source: Oral (07/03 0011) BP: 129/68 (07/03 0404) Pulse Rate: 68 (07/03 0404)  Labs: Recent Labs    06/29/24 1657 06/29/24 1812 06/29/24 2117 06/30/24 0505 07/01/24 0404 07/02/24 0406  HGB 15.3  --   --  13.5  --   --   HCT 45.4  --   --  40.1  --   --   PLT 213  --   --  186  --   --   LABPROT  --   --    < > 24.6* 25.1* 27.6*  INR  --   --    < > 2.1* 2.2* 2.4*  CREATININE  --  2.31*  --  1.86* 1.89*  --    < > = values in this interval not displayed.    Estimated Creatinine Clearance: 24.3 mL/min (A) (by C-G formula based on SCr of 1.89 mg/dL (H)).   Medical History: Past Medical History:  Diagnosis Date   Chronic kidney disease    Clotting disorder (HCC)    Dementia (HCC)    Heart murmur    Skin cancer     Medications:  Scheduled:   amLODipine   2.5 mg Oral Daily   ascorbic acid   1,000 mg Oral Daily   cyanocobalamin   1,000 mcg Oral Q0600   donepezil   10 mg Oral QHS   gabapentin   200 mg Oral QHS   loratadine   10 mg Oral Daily   tamsulosin  0.4 mg Oral QPC supper   warfarin  5 mg Oral q1600   Warfarin - Pharmacist Dosing Inpatient   Does not apply q1600   Assessment: 89 y/o male presenting with hypercalcemia. PMH significant for CKD stage IV, hypertension, dementia, BPH, DVT on Coumadin , skin cancer. Pharmacy has been consulted to resume and manage warfarin.    Warfarin regimen: 5 mg daily (total weekly dose of 35 mg) Last warfarin dose prior to admission: 5 mg on 06/28/24 @ 1800  Baseline labs: INR 2.0, hgb 15.3, plt 213  Date INR Warfarin Dose 06/30 2.0 5 mg 07/01 2.1 5 mg 07/02 2.2 5 mg 07/03 2.4 5 mg  Goal of Therapy:  INR 2-3 Monitor  platelets by anticoagulation protocol: Yes   Plan: INR continues to be therapeutic Warfarin 5 mg PO daily Monitor daily INR while inpatient Monitor CBC at least weekly  Thank you for involving pharmacy in this patient's care.   Will M. Lenon, PharmD Clinical Pharmacist 07/02/2024 7:21 AM

## 2024-07-02 NOTE — Progress Notes (Incomplete)
 PROGRESS NOTE Gregg Thomas  FMW:[id number] DOB: Mar 15, 1931 DOA: 06/29/2024 PCP: Gregg Duwaine SQUIBB, DO  Chief Complaint  Patient presents with   abnormal labs   Hospital Course:  Gregg Thomas is a 89 year old male with CKD stage IV, hypertension, dementia, BPH, DVT on Coumadin , skin cancer, hard of hearing, who presents with fatigue, gait imbalance, and abnormal labs with hypercalcemia.  Patient has been following with Dr. Marcelino of nephrology who had lab work performed on 6/24 which revealed calcium of 12.2.  It was recommended he present to the ED for further evaluation.  There was concern for undiagnosed malignancy. CT abdomen pelvis reveals prior granulomatous disease and stable nodule.  CT abdomen pelvis without acute findings. He is medically cleared for dc. PT/OT recommend HH.  Subjective: Pt reports feeling well today. Reports no complaints.   Objective: Vitals:   07/01/24 1546 07/01/24 1958 07/02/24 0011 07/02/24 0404  BP: (!) 148/65 (!) 149/62 138/63 129/68  Pulse: (!) 57 64 62 68  Resp: 17 17 17 17   Temp:  98 F (36.7 C) 98 F (36.7 C)   TempSrc:   Oral   SpO2: 95% 95% 95% 90%  Weight:      Height:        Intake/Output Summary (Last 24 hours) at 07/02/2024 0718 Last data filed at 07/02/2024 0340 Gross per 24 hour  Intake 360 ml  Output 1325 ml  Net -965 ml   Filed Weights   06/29/24 1635 07/01/24 0500  Weight: 74.8 kg 75.6 kg   Examination: General exam: Appears calm and comfortable, NAD  Respiratory system: No increased work of breathing, symmetric chest wall expansion Cardiovascular system: S1 & S2 heard, RRR.  Gastrointestinal system: Abdomen is nondistended, soft and nontender.  Neuro: Alert, disoriented.  Oriented to self and wife and situational  Extremities: Symmetric, expected ROM Skin: No rashes, lesions Psychiatry: mood and affect congruent  Assessment & Plan:  Principal Problem:   Hypercalcemia Active Problems:   CKD (chronic  kidney disease) stage 4, GFR 15-29 ml/min (HCC)   Benign prostatic hyperplasia with nocturia   HTN (hypertension)   Chronic deep vein thrombosis (DVT) of femoral vein of left lower extremity (HCC)   Dementia (HCC)   Overweight (BMI 25.0-29.9)  Hypercalcemia- Calcium 12.1 on arrival>>10.6 s/p pamidronate 90 mg x 1 6/30 - Calcium downtrending now -- PTH pending - Further correction per nephrology - No evidence of malignancy on scans.  Does have granulomatous disease which may be playing a role  CKD IV -Creatinine appears to be at baseline. Cr 1.89 - Nephrology consulted as above - Continue to renally dose medications with a creatinine clearance of 24  BPH with nocturia- has good UOP - Resume home meds  HTN - Resume home meds  Chronic DVT of femoral vein of left lower extremity - On Coumadin , pharmacy consulted to assist in management  Dementia -At baseline walks with a cane, has had gradual decline over the last few weeks.  His wife reports his mental status varies.  Deconditioned -- Currently deconditioned beyond baseline.  Has had gradual decline over the last 1 month.  Perhaps related to hypercalcemia --PT/OT evals recommend HH.   Overweight  BMI 25 -- Outpatient follow up for lifestyle modification and risk factor management  DVT prophylaxis: warfarin   Code Status: Limited: Do not attempt resuscitation (DNR) -DNR-LIMITED -Do Not Intubate/DNI   Disposition:  awaiting dc due to patient wife wanting him to get stronger before he discharges home  Consultants:  Treatment Team:  Consulting Physician: Lateef, Munsoor, MD  Procedures:  None   Antimicrobials:  Anti-infectives (From admission, onward)    None       Data Reviewed: I have personally reviewed following labs and imaging studies CBC: Recent Labs  Lab 06/29/24 1657 06/30/24 0505  WBC 6.3 4.6  HGB 15.3 13.5  HCT 45.4 40.1  MCV 99.1 98.3  PLT 213 186   Basic Metabolic Panel: Recent Labs  Lab  06/29/24 1812 06/30/24 0505 07/01/24 0404  NA 140 140 139  K 5.0 3.9 4.2  CL 109 110 111  CO2 24 23 22   GLUCOSE 102* 91 82  BUN 31* 29* 29*  CREATININE 2.31* 1.86* 1.89*  CALCIUM 12.1* 11.1* 10.6*  MG 2.3  --   --   PHOS 2.8  --   --    GFR: Estimated Creatinine Clearance: 24.3 mL/min (A) (by C-G formula based on SCr of 1.89 mg/dL (H)).  Radiology Studies: No results found.   Scheduled Meds:  amLODipine   2.5 mg Oral Daily   ascorbic acid   1,000 mg Oral Daily   cyanocobalamin   1,000 mcg Oral Q0600   donepezil   10 mg Oral QHS   gabapentin   200 mg Oral QHS   loratadine   10 mg Oral Daily   tamsulosin  0.4 mg Oral QPC supper   warfarin  5 mg Oral q1600   Warfarin - Pharmacist Dosing Inpatient   Does not apply q1600     LOS: 0 days   Gregg LITTIE Piety, DO [facility]  To contact the attending physician between 7A-7P please use Epic Chat. To contact the covering physician during after hours 7P-7A, please review Amion.  07/02/2024, 7:18 AM

## 2024-07-02 NOTE — TOC Progression Note (Signed)
 Transition of Care [facility]) - Progression Note    Patient Details  Name: Gregg Thomas MRN: [id number] Date of Birth: 1931/11/15  [facility] CM/SW Contact  Lorraine LILLETTE Fenton, LCSW Phone Number: 07/02/2024, 10:38 AM  Clinical Narrative:    CSW received [place] that pr needs 3 N1, RW and HHAide.  Visit to room to speak with pt and spouse, [employer] to deliver DME to room.  Spouse very satisfied with [facility] for [facility] OT and PT and want them to provide the [facility] Aide.  CSW sent [place] to MD requesting order added for RW and HHAide.  No further TOC needs.      Barriers to Discharge: No Barriers Identified, Continued Medical Work up  Expected Discharge Plan and Services                                               Social Determinants of Health (SDOH) Interventions SDOH Screenings   Food Insecurity: No Food Insecurity (06/29/2024)  Housing: High Risk (06/29/2024)  Transportation Needs: No Transportation Needs (06/29/2024)  Utilities: Not At Risk (06/29/2024)  Alcohol Screen: Low Risk  (05/12/2024)  Depression (PHQ2-9): High Risk (06/10/2024)  Financial Resource Strain: Low Risk  (09/24/2022)  Physical Activity: Insufficiently Active (09/24/2022)  Social Connections: Moderately Integrated (06/30/2024)  Stress: No Stress Concern Present (09/24/2022)  Tobacco Use: Low Risk  (06/29/2024)   Received from [facility]  Health Literacy: Inadequate Health Literacy (05/12/2024)    Readmission Risk Interventions     No data to display

## 2024-07-02 NOTE — Discharge Instructions (Addendum)
 Please follow up with nephrology in 2-3 weeks for repeat labs I have ordered home health PT, OT, nurse aid, and speech therapy

## 2024-07-02 NOTE — Consult Note (Signed)
 Consultation Note Date: 07/02/2024   Patient Name: Gregg Thomas  DOB: [birth year]  MRN: [id number]  Age / Sex: 89 y.o., male  PCP: Gregg Duwaine SQUIBB, DO Referring Physician: Lenon Marien CROME, MD  Reason for Consultation: Establishing goals of care  HPI/Patient Profile: Per H&P Gregg Thomas is a 89 y.o. male with medical history significant of CKD stage IV, hypertension, dementia, BPH, DVT on Coumadin , skin cancer, hard of hearing, who presents with fatigue, abnormal lab with hypercalcemia.    Clinical Assessment and Goals of Care: Notes and labs reviewed.  Into see patient.  He is currently sitting at bedside chair eating breakfast, wife is at bedside.  Wife discusses that since having COVID last August, his status has declined.  She states over the past few months his status has rapidly declined.  She states prior to a few months ago he was going with her to the grocery store and other places.  She states that over these past months he has needed assistance with ADLs including bathing.  She states that he has been more confused as well.  Patient answers questions minimally.  He defers to wife.  We discussed his diagnoses, prognosis, GOC, EOL wishes disposition and options.  Created space and opportunity for patient  to explore thoughts and feelings regarding current medical information.   A detailed discussion was had today regarding advanced directives.  Concepts specific to code status, artifical feeding and hydration, IV antibiotics and rehospitalization were discussed.  The difference between an aggressive medical intervention path and a comfort care path was discussed.  Values and goals of care important to patient and family were attempted to be elicited.  Discussed limitations of medical interventions to prolong quality of life in some situations and discussed the concept of human  mortality.  Wife confirms DNR and DNI.  She states she will need to speak with nephrology regarding decisions on dialysis should the need arise.  She states she is waiting to see neurology regarding his dementia and starting therapy there.  While he was eating breakfast, he had several instances of coughing.  Wife states this has happened at home as well over the past few months.  Discussed potential speech evaluation either inpatient or outpatient to determine needs that may be present.  Attending into bedside and was updated.    SUMMARY OF RECOMMENDATIONS   DNR/DNI.  Continue to treat treatable at this time.      Primary Diagnoses: Present on Admission:  CKD (chronic kidney disease) stage 4, GFR 15-29 ml/min (HCC)  Chronic deep vein thrombosis (DVT) of femoral vein of left lower extremity (HCC)  Dementia (HCC)  Benign prostatic hyperplasia with nocturia  HTN (hypertension)  Overweight (BMI 25.0-29.9)  Hypercalcemia   I have reviewed the medical record, interviewed the patient and family, and examined the patient. The following aspects are pertinent.  Past Medical History:  Diagnosis Date   Chronic kidney disease    Clotting disorder (HCC)    Dementia (HCC)    Heart murmur  Skin cancer    Social History   Socioeconomic History   Marital status: Married    Spouse name: Not on file   Number of children: Not on file   Years of education: 13 years    Highest education level: Some college, no degree  Occupational History   Occupation: retired  Tobacco Use   Smoking status: Never   Smokeless tobacco: Never  Vaping Use   Vaping status: Never Used  Substance and Sexual Activity   Alcohol use: No   Drug use: No   Sexual activity: Not Currently  Other Topics Concern   Not on file  Social History Narrative   Gregg Thomas    Gregg Thomas       Golfing 2 times a week, alk 3 times a week    Social Drivers of Corporate investment banker  Strain: Low Risk  (09/24/2022)   Overall Financial Resource Strain (CARDIA)    Difficulty of Paying Living Expenses: Not hard at all  Food Insecurity: No Food Insecurity (06/29/2024)   Hunger Vital Sign    Worried About Running Out of Food in the Last Year: Never true    Ran Out of Food in the Last Year: Never true  Transportation Needs: No Transportation Needs (06/29/2024)   PRAPARE - Administrator, Civil Service (Medical): No    Lack of Transportation (Non-Medical): No  Physical Activity: Insufficiently Active (09/24/2022)   Exercise Vital Sign    Days of Exercise per Week: 2 days    Minutes of Exercise per Session: 20 min  Stress: No Stress Concern Present (09/24/2022)   Gregg Thomas of Occupational Health - Occupational Stress Questionnaire    Feeling of Stress : Not at all  Social Connections: Moderately Integrated (06/30/2024)   Social Connection and Isolation Panel    Frequency of Communication with Friends and Family: More than three times a week    Frequency of Social Gatherings with Friends and Family: More than three times a week    Attends Religious Services: More than 4 times per year    Active Member of [employer] or Organizations: No    Attends Engineer, structural: Never    Marital Status: Married   Family History  Problem Relation Age of Onset   Stroke Mother    Cerebral aneurysm Father    Scheduled Meds:  amLODipine   2.5 mg Oral Daily   ascorbic acid   1,000 mg Oral Daily   cyanocobalamin   1,000 mcg Oral Q0600   donepezil   10 mg Oral QHS   gabapentin   200 mg Oral QHS   loratadine   10 mg Oral Daily   tamsulosin  0.4 mg Oral QPC supper   warfarin  5 mg Oral q1600   Warfarin - Pharmacist Dosing Inpatient   Does not apply q1600   Continuous Infusions: PRN Meds:.acetaminophen , albuterol , dextromethorphan-guaiFENesin , ondansetron  (ZOFRAN ) IV Medications Prior to Admission:  Prior to Admission medications   Medication Sig Start Date End Date  Taking? Authorizing Provider  albuterol  (VENTOLIN  HFA) 108 (90 Base) MCG/ACT inhaler Inhale 2 puffs into the lungs every 6 (six) hours as needed for wheezing or shortness of breath. 05/12/24  Yes Johnson, Megan P, DO  amLODipine  (NORVASC ) 2.5 MG tablet Take 1 tablet (2.5 mg total) by mouth daily. 05/12/24  Yes Johnson, Megan P, DO  ascorbic acid  (VITAMIN C ) 1000 MG tablet Take by mouth.   Yes [provider]  cetirizine  (ZYRTEC ) 5 MG tablet Take  1 tablet (5 mg total) by mouth daily. 05/12/24  Yes Johnson, Megan P, DO  Cyanocobalamin  (VITAMIN B-12 ER PO) Take 1,000 mcg by mouth daily at 6 (six) AM.   Yes [provider]  donepezil  (ARICEPT ) 10 MG tablet Take 1 tablet (10 mg total) by mouth at bedtime. 05/12/24  Yes Johnson, Megan P, DO  ELDERBERRY PO Take 1 tablet by mouth daily.   Yes [provider]  fluticasone  (FLONASE ) 50 MCG/ACT nasal spray Place 2 sprays into both nostrils daily. 05/12/24  Yes Johnson, Megan P, DO  gabapentin  (NEURONTIN ) 100 MG capsule Take 2 capsules (200 mg total) by mouth at bedtime. 05/12/24  Yes Johnson, Megan P, DO  Grape Seed Extract 50 MG CAPS Take 1,300 mg by mouth daily.    Yes [provider]  Ipratropium-Albuterol  (COMBIVENT ) 20-100 MCG/ACT AERS respimat Inhale 1 puff into the lungs every 6 (six) hours.   Yes [provider]  montelukast  (SINGULAIR ) 10 MG tablet Take 1 tablet (10 mg total) by mouth at bedtime. 05/12/24  Yes Johnson, Megan P, DO  warfarin (COUMADIN ) 5 MG tablet Take 1 tablet (5 mg total) by mouth one time only at 6 PM. 06/29/24  Yes Brahmanday, Govinda R, MD  zinc  gluconate 50 MG tablet Take by mouth.   Yes [provider]  tamsulosin (FLOMAX) 0.4 MG CAPS capsule Take 1 capsule (0.4 mg total) by mouth daily after supper. 07/02/24 08/01/24  Gregg Marien CROME, MD   No Known Allergies Review of Systems  All other systems reviewed and are negative.   Physical Exam Pulmonary:     Effort: Pulmonary effort  is normal.  Neurological:     Mental Status: He is alert.     Vital Signs: BP 117/60   Pulse 63   Temp 99.3 F (37.4 C) (Axillary)   Resp 18   Ht 5' 7 (1.702 m)   Wt 75.6 kg   SpO2 90%   BMI 26.10 kg/m  Pain Scale: 0-10   Pain Score: 0-No pain   SpO2: SpO2: 90 % O2 Device:SpO2: 90 % O2 Flow Rate: .   IO: Intake/output summary:  Intake/Output Summary (Last 24 hours) at 07/02/2024 1141 Last data filed at 07/02/2024 0340 Gross per 24 hour  Intake 360 ml  Output 1325 ml  Net -965 ml    LBM: Last BM Date : 06/30/24 Baseline Weight: Weight: 74.8 kg Most recent weight: Weight: 75.6 kg       Signed by: Camelia Lewis, NP   Please contact Palliative Medicine Team phone at [phone] for questions and concerns.  For individual provider: See Tracey

## 2024-07-06 ENCOUNTER — Other Ambulatory Visit: Payer: Self-pay

## 2024-07-06 LAB — PTH-RELATED PEPTIDE: PTH-related peptide: <2 pmol/L

## 2024-07-06 NOTE — Patient Instructions (Signed)
 Visit Information  Thank you for taking time to visit with me today. Please don't hesitate to contact me if I can be of assistance to you before our next scheduled appointment.  Our next appointment is by telephone on Monday, July 14th at 1:30pm. Please call the care guide team at [phone] if you need to cancel or reschedule your appointment.   Following is a copy of your care plan:   Goals Addressed             This Visit's Progress    VBCI RN Care Plan       Problems:  Chronic Disease Management support and education needs related to BPH and CKD Stage 4  Goal: Over the next 30 days the Caregiver Patient will demonstrate ongoing self health care management ability relating to CKD, BPH as evidenced by    decrease in nocturia, understanding instructions from office visit with Nephrology on 07/09/24, and inquiring with PCP if patient needs to be followed by urologist (does he need referral to urology).   Interventions:   Evaluation of current treatment plan related to BPH and CKD Stage 4,  self-management and patient's adherence to plan as established by provider. Discussed plans with patient for ongoing care management follow up and provided patient with direct contact information for care management team Advised patient to speak to Nephrologist on 7/10 if patient will need tamsulosin  long term Provided education to patient re: side effects of tamsulosin  Reviewed medications with patient and discussed newly prescribed tamsulosin  Discussed fall risk with nocturia - patient has bedside commode at the bedside but forgets and goes right in to the bathroom by habit.   Patient Self-Care Activities:  Attend all scheduled provider appointments Call pharmacy for medication refills 3-7 days in advance of running out of medications Call provider office for new concerns or questions  Take medications as prescribed    Plan:  Telephone follow up appointment with care management team member  scheduled for:  one week.              A reminder to ALL patients/family/friends,please call the [place]  National Suicide Prevention Lifeline: (352)761-7181 or TTY: (775)551-7976 TTY 713-052-2775) to talk to a trained counselor if you are experiencing a Mental Health or Behavioral Health Crisis or need someone to talk to.  Patient verbalizes understanding of instructions and care plan provided today and agrees to view in MyChart. Active MyChart status and patient understanding of how to access instructions and care plan via MyChart confirmed with patient.     Santana Stamp BSN, CCM [place]  VBCI Population Health RN Care Manager Direct Dial: [phone]  Fax: [phone]

## 2024-07-06 NOTE — Patient Outreach (Signed)
 Complex Care Management   Visit Note  07/06/2024  Name:  Gregg Thomas MRN: [id number] DOB: 10/27/1931  Situation: Referral received for Complex Care Management related to BPH, CKD stage 4, Impaired mobility. I obtained verbal consent from Caregiver.  Visit completed with Gregg Thomas  on the phone. Main concern today is patient has nocturia, going to bathroom at night a lot more than usual since starting tamsulosin  as prescribed at post- hospital 07/02/24.  He uses walker for ambulation, denies falls in several weeks.   Background:   Past Medical History:  Diagnosis Date   Chronic kidney disease    Clotting disorder (HCC)    Dementia (HCC)    Heart murmur    Skin cancer     Assessment: Patient Reported Symptoms:  Cognitive Cognitive Status: Able to follow simple commands, Requires Assistance Decision Making      Neurological Neurological Review of Symptoms: No symptoms reported    HEENT HEENT Symptoms Reported: No symptoms reported      Cardiovascular Cardiovascular Symptoms Reported: No symptoms reported    Respiratory Respiratory Symptoms Reported: No symptoms reported    Endocrine Endocrine Symptoms Reported: No symptoms reported    Gastrointestinal Gastrointestinal Symptoms Reported: No symptoms reported      Genitourinary Genitourinary Symptoms Reported: Frequency Additional Genitourinary Details: Patient was started on Tamsulosin  0.4mg  once at supper    Integumentary Integumentary Symptoms Reported: No symptoms reported    Musculoskeletal Musculoskelatal Symptoms Reviewed: Unsteady gait Additional Musculoskeletal Details: Uses walkler for ambulation, currently receiving HHPT post hospitalization Musculoskeletal Management Strategies: Routine screening Musculoskeletal Self-Management Outcome: 3 (uncertain) Falls in the past year?: Yes Number of falls in past year: 2 or more Patient at Risk for Falls Due to: History of fall(s), Impaired balance/gait   Psychosocial       Quality of Family Relationships: helpful, involved, supportive Do you feel physically threatened by others?: No      06/10/2024   11:51 AM  Depression screen PHQ 2/9  Decreased Interest 2  Down, Depressed, Hopeless 1  PHQ - 2 Score 3  Altered sleeping 3  Tired, decreased energy 3  Change in appetite 0  Feeling bad or failure about yourself  0  Trouble concentrating 3  Moving slowly or fidgety/restless 3  Suicidal thoughts 0  PHQ-9 Score 15  Difficult doing work/chores Very difficult    There were no vitals filed for this visit.  Medications Reviewed Today     Reviewed by Lucian Santana LABOR, RN (Registered Nurse) on 07/06/24 at 1456  Med List Status: <None>   Medication Order Taking? Sig Documenting Provider Last Dose Status Informant  albuterol  (VENTOLIN  HFA) 108 (90 Base) MCG/ACT inhaler [id number] Yes Inhale 2 puffs into the lungs every 6 (six) hours as needed for wheezing or shortness of breath. Vicci Bouchard P, DO  Active Spouse/Significant Other  amLODipine  (NORVASC ) 2.5 MG tablet [id number] Yes Take 1 tablet (2.5 mg total) by mouth daily. Johnson, Megan P, DO  Active Spouse/Significant Other  ascorbic acid  (VITAMIN C ) 1000 MG tablet [id number] Yes Take by mouth. [provider]  Active Spouse/Significant Other  cetirizine  (ZYRTEC ) 5 MG tablet [id number] Yes Take 1 tablet (5 mg total) by mouth daily. Vicci, Megan P, DO  Active Spouse/Significant Other  Cyanocobalamin  (VITAMIN B-12 ER PO) [id number] Yes Take 1,000 mcg by mouth daily at 6 (six) AM. [provider]  Active Spouse/Significant Other  cyanocobalamin  (VITAMIN B12) injection 1,000 mcg [id number]   Vicci Bouchard P, DO  Active  donepezil  (ARICEPT ) 10 MG tablet [id number] Yes Take 1 tablet (10 mg total) by mouth at bedtime. Vicci Bouchard P, DO  Active Spouse/Significant Other  ELDERBERRY PO [id number] Yes Take 1 tablet by mouth daily. [provider]  Active  Spouse/Significant Other  fluticasone  (FLONASE ) 50 MCG/ACT nasal spray [id number] Yes Place 2 sprays into both nostrils daily. Vicci, Megan P, DO  Active Spouse/Significant Other  gabapentin  (NEURONTIN ) 100 MG capsule [id number] Yes Take 2 capsules (200 mg total) by mouth at bedtime.  Patient taking differently: Take 100 mg by mouth at bedtime.   Vicci Bouchard P, DO  Active Spouse/Significant Other           Med Note (LAYNE, MEYHONIA T   Wed Jun 10, 2024 11:50 AM) Only taking 1 tablet daily  Grape Seed Extract 50 MG CAPS [id number] Yes Take 1,300 mg by mouth daily.  [provider]  Active Spouse/Significant Other           Med Note CATHY OVAL DEL   Tue Oct 31, 2021 10:11 AM)    Ipratropium-Albuterol  (COMBIVENT ) 20-100 MCG/ACT AERS respimat [id number]  Inhale 1 puff into the lungs every 6 (six) hours.  Patient not taking: Reported on 07/06/2024   [provider]  Active Spouse/Significant Other           Med Note CATHY OVAL DEL   Tue Jun 30, 2024  7:10 PM)    montelukast  (SINGULAIR ) 10 MG tablet [id number] Yes Take 1 tablet (10 mg total) by mouth at bedtime. Vicci, Megan P, DO  Active Spouse/Significant Other  tamsulosin  (FLOMAX ) 0.4 MG CAPS capsule [id number] Yes Take 1 capsule (0.4 mg total) by mouth daily after supper. Lenon Marien CROME, MD  Active   warfarin (COUMADIN ) 5 MG tablet [id number] Yes Take 1 tablet (5 mg total) by mouth one time only at 6 PM. Brahmanday, Govinda R, MD  Active Spouse/Significant Other  zinc  gluconate 50 MG tablet [id number] Yes Take by mouth. [provider]  Active Spouse/Significant Other            Recommendation:   -Specialty provider follow-up : scheduled 07/09/24 post hospital  -PCP post hosp follow up scheduled 07/17/24 - will discuss Urology referral as recommended by hospital -patient will continue to work with [facility] HHPT, wife will continue to get up with patient at night for bathroom trips to protect patient  from falls, she has a gait belt to use as well.   Follow Up Plan:   Telephone follow-up in 1 week  Santana Stamp BSN, CCM [place]  [facility] Population Health RN Care Manager Direct Dial: [phone]  Fax: [phone]

## 2024-07-07 ENCOUNTER — Inpatient Hospital Stay: Attending: Internal Medicine

## 2024-07-07 DIAGNOSIS — I82512 Chronic embolism and thrombosis of left femoral vein: Secondary | ICD-10-CM | POA: Diagnosis present

## 2024-07-07 DIAGNOSIS — Z7901 Long term (current) use of anticoagulants: Secondary | ICD-10-CM

## 2024-07-07 LAB — PROTIME-INR
INR: 2.7 — ABNORMAL HIGH (ref 0.8–1.2)
Prothrombin Time: 29.7 s — ABNORMAL HIGH (ref 11.4–15.2)

## 2024-07-08 ENCOUNTER — Other Ambulatory Visit: Payer: Self-pay | Admitting: Licensed Clinical Social Worker

## 2024-07-09 NOTE — Patient Outreach (Signed)
 Complex Care Management   Visit Note  07/09/2024  Name:  Gregg Thomas MRN: [id number] DOB: 1931/08/27  Situation: Referral received for Complex Care Management related to Chronic Kidney Disease I obtained verbal consent from spouse Mrs Glennon.  Visit completed with pt spouse  on the phone. Pt spouse declines assistance with pca services. No social work needs identified.   Background:   Past Medical History:  Diagnosis Date   Chronic kidney disease    Clotting disorder (HCC)    Dementia (HCC)    Heart murmur    Skin cancer     Assessment: Patient Reported Symptoms:  Cognitive Cognitive Status: Able to follow simple commands, Requires Assistance Decision Making Cognitive/Intellectual Conditions Management [RPT]: None reported or documented in medical history or problem list      Neurological Neurological Review of Symptoms: No symptoms reported    HEENT HEENT Symptoms Reported: No symptoms reported      Cardiovascular Cardiovascular Symptoms Reported: No symptoms reported    Respiratory Respiratory Symptoms Reported: No symptoms reported    Endocrine Endocrine Symptoms Reported: No symptoms reported    Gastrointestinal Gastrointestinal Symptoms Reported: No symptoms reported      Genitourinary Genitourinary Symptoms Reported: Frequency    Integumentary Integumentary Symptoms Reported: No symptoms reported    Musculoskeletal Musculoskelatal Symptoms Reviewed: Unsteady gait Additional Musculoskeletal Details: uses walker Musculoskeletal Management Strategies: Routine screening Falls in the past year?: Yes Number of falls in past year: 2 or more    Psychosocial       Quality of Family Relationships: helpful, supportive, involved Do you feel physically threatened by others?: No      06/10/2024   11:51 AM  Depression screen PHQ 2/9  Decreased Interest 2  Down, Depressed, Hopeless 1  PHQ - 2 Score 3  Altered sleeping 3  Tired, decreased energy 3   Change in appetite 0  Feeling bad or failure about yourself  0  Trouble concentrating 3  Moving slowly or fidgety/restless 3  Suicidal thoughts 0  PHQ-9 Score 15  Difficult doing work/chores Very difficult    There were no vitals filed for this visit.  Medications Reviewed Today     Reviewed by Clement Odor, LCSW (Social Worker) on 07/09/24 at 1214  Med List Status: <None>   Medication Order Taking? Sig Documenting Provider Last Dose Status Informant  albuterol  (VENTOLIN  HFA) 108 (90 Base) MCG/ACT inhaler [id number]  Inhale 2 puffs into the lungs every 6 (six) hours as needed for wheezing or shortness of breath. Vicci Bouchard P, DO  Active Spouse/Significant Other  amLODipine  (NORVASC ) 2.5 MG tablet [id number]  Take 1 tablet (2.5 mg total) by mouth daily. Johnson, Megan P, DO  Active Spouse/Significant Other  ascorbic acid  (VITAMIN C ) 1000 MG tablet [id number]  Take by mouth. [provider]  Active Spouse/Significant Other  cetirizine  (ZYRTEC ) 5 MG tablet [id number]  Take 1 tablet (5 mg total) by mouth daily. Vicci, Megan P, DO  Active Spouse/Significant Other  Cyanocobalamin  (VITAMIN B-12 ER PO) [id number]  Take 1,000 mcg by mouth daily at 6 (six) AM. [provider]  Active Spouse/Significant Other  cyanocobalamin  (VITAMIN B12) injection 1,000 mcg [id number]   Vicci, Megan P, DO  Active   donepezil  (ARICEPT ) 10 MG tablet [id number]  Take 1 tablet (10 mg total) by mouth at bedtime. Vicci Bouchard P, DO  Active Spouse/Significant Other  ELDERBERRY PO [id number]  Take 1 tablet by mouth daily. [provider]  Active Spouse/Significant Other  fluticasone  (FLONASE ) 50  MCG/ACT nasal spray [id number]  Place 2 sprays into both nostrils daily. Vicci, Megan P, DO  Active Spouse/Significant Other  gabapentin  (NEURONTIN ) 100 MG capsule [id number]  Take 2 capsules (200 mg total) by mouth at bedtime.  Patient taking differently: Take 100 mg by mouth at bedtime.    Vicci Bouchard P, DO  Active Spouse/Significant Other           Med Note (LAYNE, MEYHONIA T   Wed Jun 10, 2024 11:50 AM) Only taking 1 tablet daily  Grape Seed Extract 50 MG CAPS [id number]  Take 1,300 mg by mouth daily.  [provider]  Active Spouse/Significant Other           Med Note CATHY OVAL DEL   Tue Oct 31, 2021 10:11 AM)    Ipratropium-Albuterol  (COMBIVENT ) 20-100 MCG/ACT AERS respimat [id number]  Inhale 1 puff into the lungs every 6 (six) hours.  Patient not taking: Reported on 07/06/2024   [provider]  Active Spouse/Significant Other           Med Note CATHY OVAL DEL   Tue Jun 30, 2024  7:10 PM)    montelukast  (SINGULAIR ) 10 MG tablet [id number]  Take 1 tablet (10 mg total) by mouth at bedtime. Vicci, Megan P, DO  Active Spouse/Significant Other  tamsulosin  (FLOMAX ) 0.4 MG CAPS capsule [id number]  Take 1 capsule (0.4 mg total) by mouth daily after supper. Lenon Marien CROME, MD  Active   warfarin (COUMADIN ) 5 MG tablet [id number]  Take 1 tablet (5 mg total) by mouth one time only at 6 PM. Brahmanday, Govinda R, MD  Active Spouse/Significant Other  zinc  gluconate 50 MG tablet [id number]  Take by mouth. [provider]  Active Spouse/Significant Other            Recommendation:   Continue Current Plan of Care  Follow Up Plan:   Telephone follow up appointment date/time:  07/13/2024  Alfonso Rummer MSW, LCSW Licensed Clinical Social Worker  [facility], Population Health Direct Dial: [phone]  Fax: [phone]

## 2024-07-09 NOTE — Patient Instructions (Signed)
 Visit Information  Thank you for taking time to visit with me today. Please don't hesitate to contact me if I can be of assistance to you before our next scheduled appointment.  Your next care management appointment is by telephone on 07/13/2024 with [facility]  Telephone follow up appointment date/time:  07/13/2024 at 1:30  Please call the care guide team at [phone] if you need to cancel, schedule, or reschedule an appointment.   Please call 911 if you are experiencing a Mental Health or Behavioral Health Crisis or need someone to talk to.  Alfonso Rummer MSW, LCSW Licensed Clinical Social Worker  [facility], Population Health Direct Dial: [phone]  Fax: [phone]

## 2024-07-13 ENCOUNTER — Other Ambulatory Visit: Payer: Self-pay

## 2024-07-13 ENCOUNTER — Ambulatory Visit

## 2024-07-13 NOTE — Patient Instructions (Signed)
 Visit Information  Thank you for taking time to visit with me today. Please don't hesitate to contact me if I can be of assistance to you before our next scheduled appointment.  Your next care management appointment is by telephone on Monday, August 11th at 1:00pm.    Please call the care guide team at [phone] if you need to cancel, schedule, or reschedule an appointment.   A reminder to ALL patients/family/friends,  please call the [place]  National Suicide Prevention Lifeline: 484 267 0435 or TTY: (814)697-1356 TTY [phone]) to talk to a trained counselor if you are experiencing a Mental Health or Behavioral Health Crisis or need someone to talk to.  Santana Stamp BSN, CCM [place]  [facility] Population Health RN Care Manager Direct Dial: [phone]  Fax: [phone]

## 2024-07-13 NOTE — Patient Outreach (Signed)
 Complex Care Management   Visit Note  07/13/2024  Name:  Gregg Thomas MRN: [id number] DOB: [birth year]  Situation: Referral received for Complex Care Management related to BPH, CKD4. I obtained verbal consent from Caregiver.  Visit completed with Gregg Thomas/wife  on the phone.  Patient is getting up at night more often to urinate.  He is a fall risk and wife would prefer some guidance Patient is waiting for referral appointment with Urology as ordered by Nephrology at appt 07/09/24.   .   Background:   Past Medical History:  Diagnosis Date   Chronic kidney disease    Clotting disorder (HCC)    Dementia (HCC)    Heart murmur    Skin cancer     Assessment: Patient Reported Symptoms:  Cognitive Cognitive Status: No symptoms reported      Neurological Neurological Review of Symptoms: No symptoms reported    HEENT HEENT Symptoms Reported: No symptoms reported      Cardiovascular Cardiovascular Symptoms Reported: No symptoms reported    Respiratory Respiratory Symptoms Reported: No symptoms reported    Endocrine Endocrine Symptoms Reported: Not assessed    Gastrointestinal Gastrointestinal Symptoms Reported: Not assessed      Genitourinary Genitourinary Symptoms Reported: Frequency Additional Genitourinary Details: Awaiting appointment with a Urology as referred by Nephrology at office visit 07/09/24. Continues to take tamsulosin . Genitourinary Self-Management Outcome: 3 (uncertain)  Integumentary Integumentary Symptoms Reported: Not assessed    Musculoskeletal Musculoskelatal Symptoms Reviewed: Unsteady gait Additional Musculoskeletal Details: Uses walker        Psychosocial Psychosocial Symptoms Reported: Not assessed            06/10/2024   11:51 AM  Depression screen PHQ 2/9  Decreased Interest 2  Down, Depressed, Hopeless 1  PHQ - 2 Score 3  Altered sleeping 3  Tired, decreased energy 3  Change in appetite 0  Feeling bad or failure about  yourself  0  Trouble concentrating 3  Moving slowly or fidgety/restless 3  Suicidal thoughts 0  PHQ-9 Score 15  Difficult doing work/chores Very difficult    There were no vitals filed for this visit.  Medications Reviewed Today   Medications were not reviewed in this encounter     Recommendation:   -Specialty provider follow-up : will await phone call regarding referral to Urology and will call Nephrology if they haven't heard back in about 1-2 weeks.  -Continue with HHPT/OT, report any falls.  -PCP follow up 07/17/24  Follow Up Plan:   Telephone follow-up in 1 month  Santana Stamp BSN, CCM [place]  [facility] Population Health RN Care Manager Direct Dial: [phone]  Fax: [phone]

## 2024-07-17 ENCOUNTER — Ambulatory Visit: Admitting: Family Medicine

## 2024-07-17 ENCOUNTER — Encounter: Payer: Self-pay | Admitting: Family Medicine

## 2024-07-17 DIAGNOSIS — E538 Deficiency of other specified B group vitamins: Secondary | ICD-10-CM

## 2024-07-17 DIAGNOSIS — N401 Enlarged prostate with lower urinary tract symptoms: Secondary | ICD-10-CM | POA: Diagnosis not present

## 2024-07-17 DIAGNOSIS — Z87440 Personal history of urinary (tract) infections: Secondary | ICD-10-CM

## 2024-07-17 DIAGNOSIS — R351 Nocturia: Secondary | ICD-10-CM

## 2024-07-17 LAB — URINALYSIS, ROUTINE W REFLEX MICROSCOPIC
Bilirubin, UA: NEGATIVE
Glucose, UA: NEGATIVE
Leukocytes,UA: NEGATIVE
Nitrite, UA: NEGATIVE
Specific Gravity, UA: >1.03 — ABNORMAL HIGH (ref 1.005–1.030)
Urobilinogen, Ur: 0.2 mg/dL (ref 0.2–1.0)
pH, UA: 5 (ref 5.0–7.5)

## 2024-07-17 LAB — MICROSCOPIC EXAMINATION
Bacteria, UA: NONE SEEN
Epithelial Cells (non renal): NONE SEEN /HPF (ref 0–10)
WBC, UA: NONE SEEN /HPF (ref 0–5)

## 2024-07-17 MED ORDER — TAMSULOSIN HCL 0.4 MG PO CAPS: 0.8000 mg | ORAL_CAPSULE | Freq: Every day | ORAL | 3 refills | Status: DC

## 2024-07-17 NOTE — Assessment & Plan Note (Signed)
 Significantly better at nephrology last week. Will continue to monitor and follow up with endocrinology. Call with any concerns.

## 2024-07-17 NOTE — Assessment & Plan Note (Signed)
 B12 shot given today

## 2024-07-17 NOTE — Assessment & Plan Note (Signed)
 Still having significant nocturia. Discussed referral to urology- but will try 0.8mg  flomax  first and see if it helps. Follow up 1 month.

## 2024-07-17 NOTE — Progress Notes (Signed)
 BP 128/61   Pulse 64   Temp 98 F (36.7 C) (Oral)   Ht 5' 7 (1.702 m)   Wt 165 lb (74.8 kg)   SpO2 94%   BMI 25.84 kg/m    Subjective:    Patient ID: Gregg Thomas, male    DOB: 1931-03-04, 89 y.o.   MRN: [id number]  HPI: Gregg Thomas is a 89 y.o. male  Chief Complaint  Patient presents with   Hospitalization Follow-up   B12 Injection         Transition of Care Hospital Follow up.   Hospital/Facility: [facility] D/C Physician: Dr. Lenon D/C Date: 07/02/24  Records Requested: 07/17/24 Records Received: 07/17/24 Records Reviewed: 07/17/24  Diagnoses on Discharge:    Hypercalcemia   CKD (chronic kidney disease) stage 4, GFR 15-29 ml/min (HCC)   Benign prostatic hyperplasia with nocturia   HTN (hypertension)   Chronic deep vein thrombosis (DVT) of femoral vein of left lower extremity (HCC)   Dementia (HCC)   Overweight (BMI 25.0-29.9)  Date of interactive Contact within 48 hours of discharge: NOT DONE  Date of 7 day or 14 day face-to-face visit:  07/17/24  NOT WITHIN 14 DAYS  Outpatient Encounter Medications as of 07/17/2024  Medication Sig Note   albuterol  (VENTOLIN  HFA) 108 (90 Base) MCG/ACT inhaler Inhale 2 puffs into the lungs every 6 (six) hours as needed for wheezing or shortness of breath.    amLODipine  (NORVASC ) 2.5 MG tablet Take 1 tablet (2.5 mg total) by mouth daily.    ascorbic acid  (VITAMIN C ) 1000 MG tablet Take by mouth.    cetirizine  (ZYRTEC ) 5 MG tablet Take 1 tablet (5 mg total) by mouth daily.    Cyanocobalamin  (VITAMIN B-12 ER PO) Take 1,000 mcg by mouth daily at 6 (six) AM.    donepezil  (ARICEPT ) 10 MG tablet Take 1 tablet (10 mg total) by mouth at bedtime.    ELDERBERRY PO Take 1 tablet by mouth daily.    fluticasone  (FLONASE ) 50 MCG/ACT nasal spray Place 2 sprays into both nostrils daily.    gabapentin  (NEURONTIN ) 100 MG capsule Take 2 capsules (200 mg total) by mouth at bedtime. (Patient taking differently: Take 100 mg by  mouth at bedtime.) 06/10/2024: Only taking 1 tablet daily   Grape Seed Extract 50 MG CAPS Take 1,300 mg by mouth daily.     Ipratropium-Albuterol  (COMBIVENT ) 20-100 MCG/ACT AERS respimat Inhale 1 puff into the lungs every 6 (six) hours.    montelukast  (SINGULAIR ) 10 MG tablet Take 1 tablet (10 mg total) by mouth at bedtime.    warfarin (COUMADIN ) 5 MG tablet Take 1 tablet (5 mg total) by mouth one time only at 6 PM.    [DISCONTINUED] tamsulosin  (FLOMAX ) 0.4 MG CAPS capsule Take 1 capsule (0.4 mg total) by mouth daily after supper.    tamsulosin  (FLOMAX ) 0.4 MG CAPS capsule Take 2 capsules (0.8 mg total) by mouth daily after supper.    [DISCONTINUED] zinc  gluconate 50 MG tablet Take by mouth. (Patient not taking: Reported on 07/17/2024)    Facility-Administered Encounter Medications as of 07/17/2024  Medication   cyanocobalamin  (VITAMIN B12) injection 1,000 mcg  Per Hospitalist: Chuck Caban is a 89 year old male with CKD stage IV, hypertension, dementia, BPH, DVT on Coumadin , skin cancer, hard of hearing, who presents with fatigue, gait imbalance, and abnormal labs with hypercalcemia.  Patient has been following with Dr. Marcelino of nephrology who had lab work performed on 6/24 which revealed calcium of 12.2.  It was recommended he present to the ED for further evaluation.  There was concern for undiagnosed malignancy. CT abdomen pelvis reveals prior granulomatous disease and stable nodule likely source of hypercalcemia.  CT abdomen pelvis without acute findings. He received 1 dose of pamidronate  on admission and no further treatments.  Palliative was consulted and made no changes.  He is medically cleared for dc. PT/OT recommend HH.  Diagnostic Tests Reviewed:  CLINICAL DATA:  Mental status change, unknown cause confusion, gait changes, r/o stroke, dementia   EXAM: CT HEAD WITHOUT CONTRAST   TECHNIQUE: Contiguous axial images were obtained from the base of the skull through the  vertex without intravenous contrast.   RADIATION DOSE REDUCTION: This exam was performed according to the departmental dose-optimization program which includes automated exposure control, adjustment of the mA and/or kV according to patient size and/or use of iterative reconstruction technique.   COMPARISON:  05/01/2014   FINDINGS: Brain: Slight progressive cerebral atrophy pattern and chronic white matter microvascular ischemic changes throughout both cerebral hemispheres. No acute intracranial hemorrhage, infarction, mass lesion midline shift, herniation, hydrocephalus, or collection. No mass effect or edema. Cisterns are patent. Cerebellar atrophy as well.   Vascular: No hyperdense vessel or unexpected calcification.   Skull: Normal. Negative for fracture or focal lesion.   Sinuses/Orbits: No acute finding.   Other: None.   IMPRESSION: 1. Progressive cerebral and cerebellar atrophy and chronic white matter microvascular ischemic changes. 2. No acute intracranial finding by noncontrast CT.  CLINICAL DATA:  Unintended weight loss   EXAM: CT CHEST, ABDOMEN AND PELVIS WITHOUT CONTRAST   TECHNIQUE: Multidetector CT imaging of the chest, abdomen and pelvis was performed following the standard protocol without IV contrast.   RADIATION DOSE REDUCTION: This exam was performed according to the departmental dose-optimization program which includes automated exposure control, adjustment of the mA and/or kV according to patient size and/or use of iterative reconstruction technique.   COMPARISON:  11/03/12   FINDINGS: CT CHEST FINDINGS   Cardiovascular: Atherosclerotic calcifications of the aorta are noted. No aneurysmal dilatation is seen. Coronary calcifications are noted. No cardiac enlargement is seen.   Mediastinum/Nodes: Thoracic inlet is within normal limits. No hilar or mediastinal adenopathy is noted. The esophagus as visualized is within normal limits.    Lungs/Pleura: Lungs are well aerated bilaterally. Mild atelectatic changes are noted in the lower lobes bilaterally. No focal effusion is noted. Calcified granuloma is noted in the left upper lobe. Additionally a 5 mm nodule is noted in the lingula but stable from the prior exam. No further follow-up is recommended.   Musculoskeletal: Degenerative changes of the thoracic spine are noted.   CT ABDOMEN PELVIS FINDINGS   Hepatobiliary: No focal liver abnormality is seen. No gallstones, gallbladder wall thickening, or biliary dilatation.   Pancreas: Unremarkable. No pancreatic ductal dilatation or surrounding inflammatory changes.   Spleen: Normal in size without focal abnormality.   Adrenals/Urinary Tract: Adrenal glands are within normal limits. Kidneys are well visualized bilaterally. No renal calculi or obstructive changes are seen. Simple cyst is noted in the lower pole of right kidney. No further follow-up is recommended. Ureters are within normal limits. The bladder is partially distended.   Stomach/Bowel: Scattered diverticular change of the colon is noted without evidence of diverticulitis. The appendix is within normal limits. Small bowel and stomach are unremarkable.   Vascular/Lymphatic: Aortic atherosclerosis. No enlarged abdominal or pelvic lymph nodes.   Reproductive: Prostate is unremarkable.   Other: No abdominal wall hernia or abnormality.  No abdominopelvic ascites.   Musculoskeletal: No acute or significant osseous findings.   IMPRESSION: CT of the chest: Basilar atelectatic changes.   Findings of prior granulomatous disease.   5 mm nodule in the lingula stable from 2013. No follow-up is recommended given its long-term stability.   CT of the abdomen and pelvis: Diverticulosis without diverticulitis.   Simple renal cyst on the right.  No follow-up is recommended.  Disposition: Home, Home health, PT, OT, SLP, and nurse aid   Consults: Nephrology,  palliative care  Discharge Instructions Follow up with PCP within 1-2 weeks Regarding general hospital follow up and preventative care Recommend repeat labs Follow up with nephrology in 2-3 weeks  Disease/illness Education: Discussed today  Home Health/Community Services Discussions/Referrals: In place  Establishment or re-establishment of referral orders for community resources: In place  Discussion with other health care providers: None  Assessment and Support of treatment regimen adherence: Fair  Appointments Coordinated with:  Patient and wife  Education for self-management, independent living, and ADLs: Discussed today  Since getting out of the hospital, Gregg Thomas has been feeling much better. His fatigue is better. He notes that he has been getting up multiple times a night to pee. He has seen his nephrologist since his hospitalization and they were pleased with his improvement. He has been referred to endocrinology for his hypercalcemia. He has an appointment for follow up labs with nephrology next week. They were thinking that the granulomatous disease found on CT is the cause of his hypercalcemia.   BPH BPH status: uncontrolled Satisfied with current treatment?: no Medication side effects: no Medication compliance: excellent compliance Duration: months Nocturia: 5+ times per night Urinary frequency:yes Incomplete voiding: no Urgency: no Weak urinary stream: no Straining to start stream: no Dysuria: no Onset: sudden Severity: severe   Relevant past medical, surgical, family and social history reviewed and updated as indicated. Interim medical history since our last visit reviewed. Allergies and medications reviewed and updated.  Review of Systems  Constitutional: Negative.   Respiratory: Negative.    Cardiovascular: Negative.   Genitourinary:  Positive for frequency. Negative for decreased urine volume, difficulty urinating, dysuria, enuresis, flank pain, genital  sores, hematuria, penile discharge, penile pain, penile swelling, scrotal swelling, testicular pain and urgency.  Musculoskeletal: Negative.   Neurological: Negative.   Psychiatric/Behavioral: Negative.      Per HPI unless specifically indicated above     Objective:    BP 128/61   Pulse 64   Temp 98 F (36.7 C) (Oral)   Ht 5' 7 (1.702 m)   Wt 165 lb (74.8 kg)   SpO2 94%   BMI 25.84 kg/m   Wt Readings from Last 3 Encounters:  07/17/24 165 lb (74.8 kg)  07/01/24 166 lb 10.7 oz (75.6 kg)  06/23/24 162 lb (73.5 kg)    Physical Exam Vitals and nursing note reviewed.  Constitutional:      General: He is not in acute distress.    Appearance: Normal appearance. He is not ill-appearing, toxic-appearing or diaphoretic.  HENT:     Head: Normocephalic and atraumatic.     Right Ear: External ear normal.     Left Ear: External ear normal.     Nose: Nose normal.     Mouth/Throat:     Mouth: Mucous membranes are moist.     Pharynx: Oropharynx is clear.  Eyes:     General: No scleral icterus.       Right eye: No discharge.  Left eye: No discharge.     Extraocular Movements: Extraocular movements intact.     Conjunctiva/sclera: Conjunctivae normal.     Pupils: Pupils are equal, round, and reactive to light.  Cardiovascular:     Rate and Rhythm: Normal rate and regular rhythm.     Pulses: Normal pulses.     Heart sounds: Normal heart sounds. No murmur heard.    No friction rub. No gallop.  Pulmonary:     Effort: Pulmonary effort is normal. No respiratory distress.     Breath sounds: Normal breath sounds. No stridor. No wheezing, rhonchi or rales.  Chest:     Chest wall: No tenderness.  Musculoskeletal:        General: Normal range of motion.     Cervical back: Normal range of motion and neck supple.  Skin:    General: Skin is warm and dry.     Capillary Refill: Capillary refill takes less than 2 seconds.     Coloration: Skin is not jaundiced or pale.     Findings: No  bruising, erythema, lesion or rash.  Neurological:     General: No focal deficit present.     Mental Status: He is alert and oriented to person, place, and time. Mental status is at baseline.  Psychiatric:        Mood and Affect: Mood normal.        Behavior: Behavior normal.        Thought Content: Thought content normal.        Judgment: Judgment normal.     Results for orders placed or performed in visit on 07/07/24  Protime-INR   Collection Time: 07/07/24  2:04 PM  Result Value Ref Range   Prothrombin Time 29.7 (H) 11.4 - 15.2 seconds   INR 2.7 (H) 0.8 - 1.2      Assessment & Plan:   Problem List Items Addressed This Visit       Other   B12 deficiency   B12 shot given today.       Benign prostatic hyperplasia with nocturia   Still having significant nocturia. Discussed referral to urology- but will try 0.8mg  flomax  first and see if it helps. Follow up 1 month.       Hypercalcemia - Primary   Significantly better at nephrology last week. Will continue to monitor and follow up with endocrinology. Call with any concerns.       Other Visit Diagnoses       History of UTI       Will recheck UA. Await results. Treat as needed.   Relevant Orders   Urinalysis, Routine w reflex microscopic   Urine Culture        Follow up plan: Return for Please move his nurse visit to my schedule in August- OK to double book.

## 2024-07-19 LAB — URINE CULTURE

## 2024-07-20 ENCOUNTER — Ambulatory Visit: Payer: Self-pay | Admitting: Family Medicine

## 2024-07-24 ENCOUNTER — Inpatient Hospital Stay

## 2024-07-24 DIAGNOSIS — Z7901 Long term (current) use of anticoagulants: Secondary | ICD-10-CM

## 2024-07-24 DIAGNOSIS — I82512 Chronic embolism and thrombosis of left femoral vein: Secondary | ICD-10-CM

## 2024-07-24 LAB — PROTIME-INR
INR: 2.8 — ABNORMAL HIGH (ref 0.8–1.2)
Prothrombin Time: 30.8 s — ABNORMAL HIGH (ref 11.4–15.2)

## 2024-08-10 ENCOUNTER — Other Ambulatory Visit: Payer: Self-pay

## 2024-08-10 NOTE — Patient Outreach (Signed)
 Complex Care Management   Visit Note  08/10/2024  Name:  Gregg Thomas MRN: [id number] DOB: 1931-11-09  Situation: Referral received for Complex Care Management related to Impaired Mobility, BPH. I obtained verbal consent from Caregiver.  Visit completed with Mrs. Towles  on the phone. Wife reports nocturia has greatly improved since PCP increased Flomax  to 0.8mg  once daily. She is concerned of intermittent right leg swelling that started 08/07/24, improves with elevation but then swells again. Denies pain/redness.   Background:   Past Medical History:  Diagnosis Date   Chronic kidney disease    Clotting disorder (HCC)    Dementia (HCC)    Heart murmur    Skin cancer     Assessment: Patient Reported Symptoms:  Cognitive Cognitive Status: Requires Assistance Decision Making, Able to follow simple commands      Neurological Neurological Review of Symptoms: No symptoms reported    HEENT HEENT Symptoms Reported: No symptoms reported      Cardiovascular Cardiovascular Symptoms Reported: Swelling in legs or feet Cardiovascular Comment: Wife reports intermittent swelling in right leg that started late Friday 08/07/24, swelling improves when he elevates leg but comes back again.  Denies redness/weeping. States the swelling gets worse when he wears compression stockings.  Wife will inform PCP at F/U appt 08/12/24.  Respiratory Respiratory Symptoms Reported: No symptoms reported    Endocrine Endocrine Symptoms Reported: Not assessed    Gastrointestinal Gastrointestinal Symptoms Reported: No symptoms reported      Genitourinary Genitourinary Symptoms Reported: Frequency Additional Genitourinary Details: Reports since PCP increased Flomax  to 0.8mg , nocturia has greatly improved, only gets up 2-3 times per night opposed to 6-8 times. They are pleased with this improvement.    Integumentary Integumentary Symptoms Reported: No symptoms reported    Musculoskeletal Musculoskelatal  Symptoms Reviewed: Unsteady gait        Psychosocial Psychosocial Symptoms Reported: Not assessed            06/10/2024   11:51 AM  Depression screen PHQ 2/9  Decreased Interest 2  Down, Depressed, Hopeless 1  PHQ - 2 Score 3  Altered sleeping 3  Tired, decreased energy 3  Change in appetite 0  Feeling bad or failure about yourself  0  Trouble concentrating 3  Moving slowly or fidgety/restless 3  Suicidal thoughts 0  PHQ-9 Score 15  Difficult doing work/chores Very difficult    There were no vitals filed for this visit.  Medications Reviewed Today   Medications were not reviewed in this encounter      Recommendation:   PCP follow up: 08/12/24 - wife will discuss right leg swelling with PCP Specialty provider follow-up : Nephrology 08/20/24, Oncology 09/04/24 Continue Current Plan of Care  Follow Up Plan:   Telephone follow-up in 1 month  Santana Stamp BSN, CCM [place]  [facility] Population Health RN Care Manager Direct Dial: [phone]  Fax: [phone]

## 2024-08-10 NOTE — Patient Instructions (Signed)
 Visit Information  Thank you for taking time to visit with me today. Please don't hesitate to contact me if I can be of assistance to you before our next scheduled appointment.  Your next care management appointment is by telephone on Wednesday, September 10th at 1:00pm.    Please call the care guide team at [phone] if you need to cancel, schedule, or reschedule an appointment.   A reminder to ALL patients/family/friends, please call the [place]  National Suicide Prevention Lifeline: (540)620-7232 or TTY: 636-709-4487 TTY [phone]) to talk to a trained counselor if you are experiencing a Mental Health or Behavioral Health Crisis or need someone to talk to.  Santana Stamp BSN, CCM [place]  [facility] Population Health RN Care Manager Direct Dial: [phone]  Fax: [phone]

## 2024-08-12 ENCOUNTER — Ambulatory Visit: Admitting: Family Medicine

## 2024-08-12 ENCOUNTER — Encounter: Payer: Self-pay | Admitting: Family Medicine

## 2024-08-12 ENCOUNTER — Ambulatory Visit

## 2024-08-12 VITALS — BP 130/76 | HR 98 | Temp 97.5°F | Ht 67.0 in | Wt 171.0 lb

## 2024-08-12 DIAGNOSIS — R410 Disorientation, unspecified: Secondary | ICD-10-CM | POA: Diagnosis not present

## 2024-08-12 DIAGNOSIS — N1832 Chronic kidney disease, stage 3b: Secondary | ICD-10-CM

## 2024-08-12 DIAGNOSIS — N401 Enlarged prostate with lower urinary tract symptoms: Secondary | ICD-10-CM

## 2024-08-12 DIAGNOSIS — I129 Hypertensive chronic kidney disease with stage 1 through stage 4 chronic kidney disease, or unspecified chronic kidney disease: Secondary | ICD-10-CM

## 2024-08-12 DIAGNOSIS — R2689 Other abnormalities of gait and mobility: Secondary | ICD-10-CM

## 2024-08-12 DIAGNOSIS — R29898 Other symptoms and signs involving the musculoskeletal system: Secondary | ICD-10-CM

## 2024-08-12 DIAGNOSIS — F03A0 Unspecified dementia, mild, without behavioral disturbance, psychotic disturbance, mood disturbance, and anxiety: Secondary | ICD-10-CM

## 2024-08-12 DIAGNOSIS — I10 Essential (primary) hypertension: Secondary | ICD-10-CM | POA: Diagnosis not present

## 2024-08-12 DIAGNOSIS — R42 Dizziness and giddiness: Secondary | ICD-10-CM

## 2024-08-12 DIAGNOSIS — E538 Deficiency of other specified B group vitamins: Secondary | ICD-10-CM

## 2024-08-12 DIAGNOSIS — E2089 Other specified hypoparathyroidism: Secondary | ICD-10-CM

## 2024-08-12 DIAGNOSIS — R413 Other amnesia: Secondary | ICD-10-CM

## 2024-08-12 DIAGNOSIS — I82512 Chronic embolism and thrombosis of left femoral vein: Secondary | ICD-10-CM

## 2024-08-12 MED ORDER — TAMSULOSIN HCL 0.4 MG PO CAPS: 0.8000 mg | ORAL_CAPSULE | Freq: Every day | ORAL | 1 refills | Status: DC

## 2024-08-12 NOTE — Progress Notes (Signed)
 BP 130/76   Pulse 98   Temp (!) 97.5 F (36.4 C) (Oral)   Ht 5' 7 (1.702 m)   Wt 171 lb (77.6 kg)   SpO2 97%   BMI 26.78 kg/m    Subjective:    Patient ID: Gregg Thomas, male    DOB: 08/30/1931, 89 y.o.   MRN: [id number]  HPI: Gregg Thomas is a 89 y.o. male  Chief Complaint  Patient presents with   Benign Prostatic Hypertrophy   BPH BPH status: better Satisfied with current treatment?: yes Medication side effects: no Medication compliance: excellent compliance Duration: chronic Nocturia: 1-2x per night Urinary frequency:no Incomplete voiding: no Urgency: no Weak urinary stream: no Straining to start stream: no Dysuria: no Onset: gradual Severity: mild  Physical therapy through home health is ending. Has been very helpful for balance. Still having trouble with getting around at home and with balance. Would like to continue PT. Would benefit from balance training  Relevant past medical, surgical, family and social history reviewed and updated as indicated. Interim medical history since our last visit reviewed. Allergies and medications reviewed and updated.  Review of Systems  Constitutional: Negative.   Respiratory: Negative.    Cardiovascular: Negative.   Genitourinary: Negative.   Musculoskeletal: Negative.   Psychiatric/Behavioral: Negative.      Per HPI unless specifically indicated above     Objective:    BP 130/76   Pulse 98   Temp (!) 97.5 F (36.4 C) (Oral)   Ht 5' 7 (1.702 m)   Wt 171 lb (77.6 kg)   SpO2 97%   BMI 26.78 kg/m   Wt Readings from Last 3 Encounters:  08/12/24 171 lb (77.6 kg)  07/17/24 165 lb (74.8 kg)  07/01/24 166 lb 10.7 oz (75.6 kg)    Physical Exam Vitals and nursing note reviewed.  Constitutional:      General: He is not in acute distress.    Appearance: Normal appearance. He is not ill-appearing, toxic-appearing or diaphoretic.  HENT:     Head: Normocephalic and atraumatic.     Right  Ear: External ear normal.     Left Ear: External ear normal.     Nose: Nose normal.     Mouth/Throat:     Mouth: Mucous membranes are moist.     Pharynx: Oropharynx is clear.  Eyes:     General: No scleral icterus.       Right eye: No discharge.        Left eye: No discharge.     Extraocular Movements: Extraocular movements intact.     Conjunctiva/sclera: Conjunctivae normal.     Pupils: Pupils are equal, round, and reactive to light.  Cardiovascular:     Rate and Rhythm: Normal rate and regular rhythm.     Pulses: Normal pulses.     Heart sounds: Normal heart sounds. No murmur heard.    No friction rub. No gallop.  Pulmonary:     Effort: Pulmonary effort is normal. No respiratory distress.     Breath sounds: Normal breath sounds. No stridor. No wheezing, rhonchi or rales.  Chest:     Chest wall: No tenderness.  Musculoskeletal:        General: Normal range of motion.     Cervical back: Normal range of motion and neck supple.  Skin:    General: Skin is warm and dry.     Capillary Refill: Capillary refill takes less than 2 seconds.     Coloration:  Skin is not jaundiced or pale.     Findings: No bruising, erythema, lesion or rash.  Neurological:     General: No focal deficit present.     Mental Status: He is alert and oriented to person, place, and time. Mental status is at baseline.  Psychiatric:        Mood and Affect: Mood normal.        Behavior: Behavior normal.        Thought Content: Thought content normal.        Judgment: Judgment normal.     Results for orders placed or performed in visit on 07/24/24  Protime-INR   Collection Time: 07/24/24  1:17 PM  Result Value Ref Range   Prothrombin Time 30.8 (H) 11.4 - 15.2 seconds   INR 2.8 (H) 0.8 - 1.2      Assessment & Plan:   Problem List Items Addressed This Visit       Cardiovascular and Mediastinum   Chronic deep vein thrombosis (DVT) of femoral vein of left lower extremity (HCC)   Relevant Orders    Ambulatory referral to Home Health   HTN (hypertension)   Relevant Orders   Ambulatory referral to Home Health     Endocrine   Secondary hypoparathyroidism [facility])   Relevant Orders   Ambulatory referral to Home Health     Nervous and Auditory   Dementia [facility])   Relevant Orders   Ambulatory referral to Home Health   Confusion   Relevant Orders   Ambulatory referral to Home Health     Musculoskeletal and Integument   Muscular deconditioning   Relevant Orders   Ambulatory referral to Home Health     Genitourinary   Benign hypertensive renal disease   Relevant Orders   Ambulatory referral to Home Health   CKD stage 3b, GFR 30-44 ml/min (HCC)   Relevant Orders   Ambulatory referral to Home Health     Other   Memory loss   Relevant Orders   Ambulatory referral to Home Health   B12 deficiency   Relevant Orders   Ambulatory referral to Home Health   Balance problem   Relevant Orders   Ambulatory referral to Home Health   Dizziness   Relevant Orders   Ambulatory referral to Home Health   Benign prostatic hyperplasia with nocturia - Primary   Significantly better on 0.8mg  flomax . Continue current regimen. Continue to monitor. Call with any concerns.       Relevant Orders   Ambulatory referral to Home Health   Would benefit from PT through home health. Ordered today.   Follow up plan: Return for 1 month nurse vist for b12, 2 months with me.

## 2024-08-12 NOTE — Assessment & Plan Note (Signed)
 Significantly better on 0.8mg  flomax . Continue current regimen. Continue to monitor. Call with any concerns.

## 2024-08-27 ENCOUNTER — Telehealth: Payer: Self-pay

## 2024-08-27 NOTE — Telephone Encounter (Signed)
 Copied from CRM #[id number]. Topic: Referral - Question >> Aug 24, 2024 10:25 AM Essie A wrote: Reason for CRM: Patient's wife, Shawnee, called because patient got a visit from [facility] on 08/22/24. Ms. Shawnee thought that [facility] should have been coming because they have been coming in the past and that they asked for them to come.  Please return her call to clarify why [facility] is coming.  Her phone number is [phone].     I sent message to patient to follow up on this.

## 2024-09-04 ENCOUNTER — Encounter: Payer: Self-pay | Admitting: Internal Medicine

## 2024-09-04 ENCOUNTER — Inpatient Hospital Stay (HOSPITAL_BASED_OUTPATIENT_CLINIC_OR_DEPARTMENT_OTHER): Admitting: Internal Medicine

## 2024-09-04 ENCOUNTER — Inpatient Hospital Stay: Attending: Internal Medicine

## 2024-09-04 VITALS — BP 140/69 | HR 63 | Temp 97.7°F | Ht 67.0 in | Wt 169.0 lb

## 2024-09-04 DIAGNOSIS — I82512 Chronic embolism and thrombosis of left femoral vein: Secondary | ICD-10-CM | POA: Insufficient documentation

## 2024-09-04 DIAGNOSIS — C4491 Basal cell carcinoma of skin, unspecified: Secondary | ICD-10-CM | POA: Insufficient documentation

## 2024-09-04 DIAGNOSIS — Z7901 Long term (current) use of anticoagulants: Secondary | ICD-10-CM | POA: Diagnosis not present

## 2024-09-04 DIAGNOSIS — Z79899 Other long term (current) drug therapy: Secondary | ICD-10-CM | POA: Insufficient documentation

## 2024-09-04 DIAGNOSIS — R252 Cramp and spasm: Secondary | ICD-10-CM | POA: Diagnosis not present

## 2024-09-04 DIAGNOSIS — R5383 Other fatigue: Secondary | ICD-10-CM | POA: Diagnosis not present

## 2024-09-04 DIAGNOSIS — N183 Chronic kidney disease, stage 3 unspecified: Secondary | ICD-10-CM | POA: Diagnosis not present

## 2024-09-04 DIAGNOSIS — F0390 Unspecified dementia without behavioral disturbance: Secondary | ICD-10-CM | POA: Diagnosis not present

## 2024-09-04 DIAGNOSIS — Z823 Family history of stroke: Secondary | ICD-10-CM | POA: Insufficient documentation

## 2024-09-04 DIAGNOSIS — Z8249 Family history of ischemic heart disease and other diseases of the circulatory system: Secondary | ICD-10-CM | POA: Insufficient documentation

## 2024-09-04 LAB — CBC WITH DIFFERENTIAL (CANCER CENTER ONLY)
Abs Immature Granulocytes: 0.01 K/uL (ref 0.00–0.07)
Basophils Absolute: 0 K/uL (ref 0.0–0.1)
Basophils Relative: 1 %
Eosinophils Absolute: 0.3 K/uL (ref 0.0–0.5)
Eosinophils Relative: 6 %
HCT: 39.7 % (ref 39.0–52.0)
Hemoglobin: 13.2 g/dL (ref 13.0–17.0)
Immature Granulocytes: 0 %
Lymphocytes Relative: 27 %
Lymphs Abs: 1.2 K/uL (ref 0.7–4.0)
MCH: 32.6 pg (ref 26.0–34.0)
MCHC: 33.2 g/dL (ref 30.0–36.0)
MCV: 98 fL (ref 80.0–100.0)
Monocytes Absolute: 0.6 K/uL (ref 0.1–1.0)
Monocytes Relative: 14 %
Neutro Abs: 2.3 K/uL (ref 1.7–7.7)
Neutrophils Relative %: 52 %
Platelet Count: 192 K/uL (ref 150–400)
RBC: 4.05 MIL/uL — ABNORMAL LOW (ref 4.22–5.81)
RDW: 13.8 % (ref 11.5–15.5)
WBC Count: 4.4 K/uL (ref 4.0–10.5)
nRBC: 0 % (ref 0.0–0.2)

## 2024-09-04 LAB — BASIC METABOLIC PANEL - CANCER CENTER ONLY
Anion gap: 8 (ref 5–15)
BUN: 22 mg/dL (ref 8–23)
CO2: 21 mmol/L — ABNORMAL LOW (ref 22–32)
Calcium: 9.1 mg/dL (ref 8.9–10.3)
Chloride: 110 mmol/L (ref 98–111)
Creatinine: 2.01 mg/dL — ABNORMAL HIGH (ref 0.61–1.24)
GFR, Estimated: 31 mL/min — ABNORMAL LOW (ref >60)
Glucose, Bld: 97 mg/dL (ref 70–99)
Potassium: 4 mmol/L (ref 3.5–5.1)
Sodium: 139 mmol/L (ref 135–145)

## 2024-09-04 LAB — PROTIME-INR
INR: 2.6 — ABNORMAL HIGH (ref 0.8–1.2)
Prothrombin Time: 29.3 s — ABNORMAL HIGH (ref 11.4–15.2)

## 2024-09-04 MED ORDER — WARFARIN SODIUM 5 MG PO TABS: 5.0000 mg | ORAL_TABLET | Freq: Every day | ORAL | 1 refills | Status: AC

## 2024-09-04 NOTE — Progress Notes (Signed)
 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  Accompanied by his wife.  Gregg Thomas 89 y.o.  male history of chronic left lower extremity DVT on Coumadin  and history of CKD-III-IV; on erivedge intermittently for [facility] is here for follow-up.   Patient recently admitted to the hospital for a bladder infection and also elevated calcium thought to be granulomatous etiology.  Currently improved.  Status post physical therapy.  No recent falls. Patient Currently on  Coumadin  dose is 5 mg QD.  Denies any weight loss.  Denies any nausea vomiting.  No diarrhea.  Denies any blood in stools or black or stools.  No nausea no vomiting.  Review of Systems  Constitutional:  Positive for malaise/fatigue. Negative for chills, diaphoresis, fever and weight loss.  HENT:  Negative for nosebleeds and sore throat.   Eyes:  Negative for double vision.  Respiratory:  Negative for cough, hemoptysis, sputum production, shortness of breath and wheezing.   Cardiovascular:  Negative for chest pain, palpitations, orthopnea and leg swelling.  Gastrointestinal:  Negative for abdominal pain, blood in  stool, constipation, diarrhea, heartburn, melena, nausea and vomiting.  Genitourinary:  Negative for dysuria, frequency and urgency.  Musculoskeletal:  Positive for joint pain and myalgias. Negative for back pain.  Skin: Negative.  Negative for itching and rash.  Neurological:  Negative for dizziness, tingling, focal weakness, weakness and headaches.  Endo/Heme/Allergies:  Bruises/bleeds easily.  Psychiatric/Behavioral:  Positive for memory loss. Negative for depression. The patient is not nervous/anxious and does not have insomnia.      MEDICAL HISTORY:  Past Medical History:  Diagnosis Date   Chronic kidney disease    Clotting disorder (HCC)    Dementia (HCC)    Heart murmur    Skin cancer     SURGICAL HISTORY: Past Surgical History:  Procedure Laterality Date   biopsy Right 08/26/2019   temple biopsy - dr.dasher    EYE SURGERY Right    benign growth on right eye    KNEE SURGERY Right    MELANOMA EXCISION Bilateral    lower back     SOCIAL HISTORY: Social History   Socioeconomic History   Marital status: Married    Spouse name: Not on file   Number of children: Not on file   Years of education: 13 years    Highest education level: Some college, no degree  Occupational History   Occupation: retired  Tobacco Use   Smoking status: Never   Smokeless tobacco: Never  Vaping Use   Vaping status: Never Used  Substance and Sexual Activity   Alcohol use: No   Drug use: No   Sexual activity: Not Currently  Other Topics Concern   Not on file  Social History Narrative   [employer]    [place]  Golfing 2 times a week, alk 3 times a week    Social Drivers of Corporate investment banker Strain: Low Risk  (09/24/2022)   Overall Financial Resource Strain (CARDIA)    Difficulty of Paying Living Expenses: Not hard at all  Food Insecurity: No Food Insecurity (07/09/2024)   Hunger Vital Sign    Worried About Running Out of Food in the Last  Year: Never true    Ran Out of Food in the Last Year: Never true  Transportation Needs: No Transportation Needs (07/09/2024)   PRAPARE - Administrator, Civil Service (Medical): No    Lack of Transportation (Non-Medical): No  Physical Activity: Insufficiently Active (09/24/2022)   Exercise Vital Sign    Days of Exercise per Week: 2 days    Minutes of Exercise per Session: 20 min  Stress: No Stress Concern Present (09/24/2022)   [employer] of Occupational Health - Occupational Stress Questionnaire    Feeling of Stress : Not at all  Social Connections: Moderately Integrated (06/30/2024)   Social Connection and Isolation Panel    Frequency of Communication with Friends and Family: More than three times a week    Frequency of Social Gatherings with Friends and Family: More than three times a week    Attends Religious Services: More than 4 times per year    Active Member of [employer] or Organizations: No    Attends Banker Meetings: Never    Marital Status: Married  Catering manager Violence: Not At Risk (07/09/2024)   Humiliation, Afraid, Rape, and Kick questionnaire    Fear of Current or Ex-Partner: No    Emotionally Abused: No    Physically Abused: No    Sexually Abused: No    FAMILY HISTORY: Family History  Problem Relation Age of Onset   Stroke Mother    Cerebral aneurysm Father     ALLERGIES:  has no known allergies.  MEDICATIONS:  Current Outpatient Medications  Medication Sig Dispense Refill   albuterol  (VENTOLIN  HFA) 108 (90 Base) MCG/ACT inhaler Inhale 2 puffs into the lungs every 6 (six) hours as needed for wheezing or shortness of breath. 8 g 1   amLODipine  (NORVASC ) 2.5 MG tablet Take 1 tablet (2.5 mg total) by mouth daily. 90 tablet 1   ascorbic acid  (VITAMIN C ) 1000 MG tablet Take by mouth.     cetirizine  (ZYRTEC ) 5 MG tablet Take 1 tablet (5 mg total) by mouth daily. 90 tablet 3   Cyanocobalamin  (VITAMIN B-12 ER PO) Take 1,000 mcg by mouth  daily at 6 (six) AM.     donepezil  (ARICEPT ) 10 MG tablet Take 1 tablet (10 mg total) by mouth at bedtime. 90 tablet 1   ELDERBERRY PO Take 1 tablet by mouth daily.     fluticasone  (FLONASE ) 50 MCG/ACT nasal spray Place 2 sprays into both nostrils daily. 16 g 6   gabapentin  (NEURONTIN ) 100 MG capsule Take 2 capsules (200 mg total) by mouth at bedtime. (Patient taking differently: Take 100 mg by mouth at bedtime.) 180 capsule 1   Grape Seed Extract 50 MG CAPS Take 1,300 mg by mouth daily.      Ipratropium-Albuterol  (COMBIVENT ) 20-100 MCG/ACT AERS respimat Inhale 1 puff into the lungs every 6 (six) hours.     montelukast  (SINGULAIR ) 10 MG tablet Take 1 tablet (10 mg total) by mouth at bedtime. 90 tablet 1   tamsulosin  (FLOMAX ) 0.4 MG CAPS capsule Take 2 capsules (0.8 mg total) by mouth daily  after supper. 180 capsule 1   warfarin (COUMADIN ) 5 MG tablet Take 1 tablet (5 mg total) by mouth daily. 90 tablet 1   Current Facility-Administered Medications  Medication Dose Route Frequency Provider Last Rate Last Admin   cyanocobalamin  (VITAMIN B12) injection 1,000 mcg  1,000 mcg Intramuscular Q30 days Vicci, Megan P, DO   1,000 mcg at 08/12/24 1504      .  PHYSICAL EXAMINATION: ECOG PERFORMANCE STATUS: 0 - Asymptomatic  Vitals:   09/04/24 1311  BP: (!) 140/69  Pulse: 63  Temp: 97.7 F (36.5 C)  SpO2: 94%     Filed Weights   09/04/24 1311  Weight: 169 lb (76.7 kg)    Physical Exam Constitutional:      Comments: Elderly appearing Caucasian male patient accompanied by his wife.  Is walking himself.  HENT:     Head: Normocephalic and atraumatic.     Mouth/Throat:     Pharynx: No oropharyngeal exudate.  Eyes:     Pupils: Pupils are equal, round, and reactive to light.  Cardiovascular:     Rate and Rhythm: Normal rate and regular rhythm.  Pulmonary:     Effort: Pulmonary effort is normal. No respiratory distress.     Breath sounds: Normal breath sounds. No wheezing.  Abdominal:      General: Bowel sounds are normal. There is no distension.     Palpations: Abdomen is soft. There is no mass.     Tenderness: There is no abdominal tenderness. There is no guarding or rebound.  Musculoskeletal:        General: No tenderness. Normal range of motion.     Cervical back: Normal range of motion and neck supple.  Skin:    General: Skin is warm.  Neurological:     Mental Status: He is alert and oriented to person, place, and time.  Psychiatric:        Mood and Affect: Affect normal.      LABORATORY DATA:  I have reviewed the data as listed Lab Results  Component Value Date   WBC 4.4 09/04/2024   HGB 13.2 09/04/2024   HCT 39.7 09/04/2024   MCV 98.0 09/04/2024   PLT 192 09/04/2024   Recent Labs    11/01/23 1357 12/03/23 1326 05/12/24 1400 06/10/24 1412 06/19/24 1610 06/30/24 0505 07/01/24 0404 09/04/24 1313  NA 143   < > 143 138   < > 140 139 139  K 4.4   < > 4.4 5.7*   < > 3.9 4.2 4.0  CL 109*   < > 107* 106   < > 110 111 110  CO2 25   < > 23 22   < > 23 22 21*  GLUCOSE 95   < > 92 115*   < > 91 82 97  BUN 18   < > 24 26   < > 29* 29* 22  CREATININE 1.87*   < > 1.99* 2.18*   < > 1.86* 1.89* 2.01*  CALCIUM 9.6   < > 9.9 12.8*   < > 11.1* 10.6* 9.1  GFRNONAA  --    < >  --   --    < > 34* 33* 31*  PROT 6.5  --  6.9 7.3  --   --   --   --   ALBUMIN 3.7  --  3.8 3.8  --   --   --   --   AST 39  --  48* 45*  --   --   --   --  ALT 31  --  35 42  --   --   --   --   ALKPHOS 116  --  151* 140*  --   --   --   --   BILITOT 0.5  --  0.5 0.6  --   --   --   --    < > = values in this interval not displayed.    RADIOGRAPHIC STUDIES: I have personally reviewed the radiological images as listed and agreed with the findings in the report. No results found.  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 on Coumadin /warfarin- on  5 mg a day- [no major fluctuations noted on hedgehog inhibitor].  INR from today-2.8. continue currentt dose at 5 mg/day-  Stable-  # Basal cell carcinoma: on Hedgehog inhibitor intermittent [Dr.Dasher]- stable.   # Fatigue/ muscle cramps-suspect from intermittent vismodegib. On intermittent PT  #CKD-III [GFR 30s]Hx of hypercalciemia [possible granulomatous etiology].- Dr.Lateef; also awaiting urology evaluation- stable.   # DISPOSITION: # PT/INR every month x 6  # in 6 months- MD; cbc/bmp/Pt/INR-Dr.B  Cc;    All questions were answered. The patient knows to call the clinic with any problems, questions or concerns.    Cindy JONELLE Joe, MD 09/04/2024 2:02 PM

## 2024-09-04 NOTE — Progress Notes (Signed)
 07/02/24 [facility] ER for UTI and calcium levels. Needs refill on warfarin, pended. Sch'd to see urology Dr. Twylla  09/23/24.

## 2024-09-04 NOTE — Assessment & Plan Note (Addendum)
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 on Coumadin /warfarin- on 5 mg a day- [no major fluctuations noted on hedgehog inhibitor].  INR from today-2.8. continue currentt dose at 5 mg/day-  Stable-  # Basal cell carcinoma: on Hedgehog inhibitor intermittent [Dr.Dasher]- stable.   # Fatigue/ muscle cramps-suspect from intermittent vismodegib. On intermittent PT  #CKD-III [GFR 30s]Hx of hypercalciemia [possible granulomatous etiology].- Dr.Lateef; also awaiting urology evaluation- stable.   # DISPOSITION: # PT/INR every month x 6  # in 6 months- MD; cbc/bmp/Pt/INR-Dr.B  Cc;

## 2024-09-09 ENCOUNTER — Other Ambulatory Visit: Payer: Self-pay

## 2024-09-09 NOTE — Patient Outreach (Signed)
 Complex Care Management   Visit Note  09/09/2024  Name:  Gregg Thomas MRN: [id number] DOB: 06-17-1931  Situation: Referral received for Complex Care Management related to Chronic Kidney Disease and BPH and Impaired Mobility.  I obtained verbal consent from Caregiver.  Visit completed with Mrs. Shawnee Penta, wife/caregiver/DPR,  on the phone.  Main concern today is getting set back up with [facility] agency for continued PT.  Evaluator from [facility] home health visited but wife was confused as to why they were taking over care instead of [facility] so she declined their services.   Background:   Past Medical History:  Diagnosis Date   Chronic kidney disease    Clotting disorder (HCC)    Dementia (HCC)    Heart murmur    Skin cancer     Assessment: Patient Reported Symptoms:  Cognitive Cognitive Status: Able to follow simple commands, Requires Assistance Decision Making   Health Facilitated by: Healthy diet  Neurological Neurological Review of Symptoms: No symptoms reported    HEENT HEENT Symptoms Reported: No symptoms reported      Cardiovascular Cardiovascular Symptoms Reported: No symptoms reported    Respiratory Respiratory Symptoms Reported: No symptoms reported    Endocrine Endocrine Symptoms Reported: Not assessed    Gastrointestinal Gastrointestinal Symptoms Reported: No symptoms reported      Genitourinary Genitourinary Symptoms Reported: Frequency Additional Genitourinary Details: Has new patient appointment set up for 09/23/24 with Dr. Carter. Continues to take tamsulosin  as ordered. Genitourinary Management Strategies: Medication therapy Genitourinary Self-Management Outcome: 3 (uncertain)  Integumentary Integumentary Symptoms Reported: No symptoms reported    Musculoskeletal Musculoskelatal Symptoms Reviewed: Unsteady gait Additional Musculoskeletal Details: Uses walker.  PCP ordered continued HHPT, wife declined [facility]  agency, prefers [facility] - they have been coming out since before hospitalization, last visit was 08/11/24, patient is more familiiar with their service. Musculoskeletal Management Strategies: Routine screening, Activity Musculoskeletal Self-Management Outcome: 4 (good)      Psychosocial Psychosocial Symptoms Reported: Not assessed          09/09/2024    PHQ2-9 Depression Screening   Little interest or pleasure in doing things    Feeling down, depressed, or hopeless    PHQ-2 - Total Score    Trouble falling or staying asleep, or sleeping too much    Feeling tired or having little energy    Poor appetite or overeating     Feeling bad about yourself - or that you are a failure or have let yourself or your family down    Trouble concentrating on things, such as reading the newspaper or watching television    Moving or speaking so slowly that other people could have noticed.  Or the opposite - being so fidgety or restless that you have been moving around a lot more than usual    Thoughts that you would be better off dead, or hurting yourself in some way    PHQ2-9 Total Score    If you checked off any problems, how difficult have these problems made it for you to do your work, take care of things at home, or get along with other people    Depression Interventions/Treatment      There were no vitals filed for this visit.  Medications Reviewed Today   Medications were not reviewed in this encounter     Recommendation:   Specialty provider follow-up : new patient appointment with Dr. [facility] on 09/23/24 to address nocturia.  This  RNCM will call PCP office to inquire about re-sending referral for [facility].   Follow Up Plan:   Telephone follow-up in 1 month  Santana Stamp BSN, CCM [place]  [facility] Population Health RN Care Manager Direct Dial: [phone]  Fax: [phone]

## 2024-09-09 NOTE — Patient Instructions (Signed)
 Visit Information  Thank you for taking time to visit with me today. Please don't hesitate to contact me if I can be of assistance to you before our next scheduled appointment.  Your next care management appointment is by telephone on Wednesday, October 8th at 1:00pm.    Please call the care guide team at [phone] if you need to cancel, schedule, or reschedule an appointment.   A reminder to ALL patients/family/friends, please call the [place]  National Suicide Prevention Lifeline: 608-866-8127 or TTY: 514-730-8045 TTY (445) 472-2133) to talk to a trained counselor if you are experiencing a Mental Health or Behavioral Health Crisis or need someone to talk to.  Santana Stamp BSN, CCM [place]  [facility] Population Health RN Care Manager Direct Dial: [phone]  Fax: [phone]

## 2024-09-14 ENCOUNTER — Ambulatory Visit (INDEPENDENT_AMBULATORY_CARE_PROVIDER_SITE_OTHER)

## 2024-09-14 DIAGNOSIS — E538 Deficiency of other specified B group vitamins: Secondary | ICD-10-CM | POA: Diagnosis not present

## 2024-09-14 NOTE — Progress Notes (Signed)
 Patient is in office today for a nurse visit for B12 Injection. Patient Injection was given in the  Left deltoid. Patient tolerated injection well.

## 2024-09-23 ENCOUNTER — Encounter: Payer: Self-pay | Admitting: Urology

## 2024-09-23 ENCOUNTER — Ambulatory Visit (INDEPENDENT_AMBULATORY_CARE_PROVIDER_SITE_OTHER): Admitting: Urology

## 2024-09-23 VITALS — BP 133/73 | HR 64 | Ht 69.0 in | Wt 170.0 lb

## 2024-09-23 DIAGNOSIS — N401 Enlarged prostate with lower urinary tract symptoms: Secondary | ICD-10-CM

## 2024-09-23 DIAGNOSIS — R350 Frequency of micturition: Secondary | ICD-10-CM | POA: Diagnosis not present

## 2024-09-23 LAB — MICROSCOPIC EXAMINATION

## 2024-09-23 LAB — BLADDER SCAN AMB NON-IMAGING

## 2024-09-23 NOTE — Progress Notes (Signed)
 09/23/2024 1:16 PM   Gregg Thomas [facility] 1931-05-14 [id number]  Referring provider: [name], DO [address] [place],  [place] [address]  Chief Complaint  Patient presents with   Urinary Frequency    HPI: Gregg Thomas is a 89 y.o. male referred for evaluation of BPH.  His spouse and daughter were with him and provided the history  Diagnosed with Citrobacter UTI June 2025 which was treated and follow culture negative however developed significant urinary frequency every 1-2 hours.  He was hospitalized late June 2025 for fatigue and hypercalcemia. CT chest/abdomen/pelvis showed no GU abnormalities Started on tamsulosin  during his hospitalization with improvement in his urinary frequency and nocturia.  His PCP titrated the tamsulosin  to 0.8 mg with continued improvement.  Presently gets up 2-3 times per night and daytime frequency has improved.  He does not have bothersome lower urinary tract symptoms at this time Also had dementia which may be contributing to his lower urinary tract symptoms   PMH: Past Medical History:  Diagnosis Date   Chronic kidney disease    Clotting disorder    Dementia (HCC)    Heart murmur    Skin cancer     Surgical History: Past Surgical History:  Procedure Laterality Date   biopsy Right 08/26/2019   temple biopsy - dr.dasher    EYE SURGERY Right    benign growth on right eye    KNEE SURGERY Right    MELANOMA EXCISION Bilateral    lower back     Home Medications:  Allergies as of 09/23/2024   No Known Allergies      Medication List        Accurate as of September 23, 2024  1:16 PM. If you have any questions, ask your nurse or doctor.          albuterol  108 (90 Base) MCG/ACT inhaler Commonly known as: VENTOLIN  HFA Inhale 2 puffs into the lungs every 6 (six) hours as needed for wheezing or shortness of breath.   amLODipine  2.5 MG tablet Commonly known as: NORVASC  Take 1 tablet (2.5 mg total) by mouth daily.    ascorbic acid  1000 MG tablet Commonly known as: VITAMIN C  Take by mouth.   cetirizine  5 MG tablet Commonly known as: ZYRTEC  Take 1 tablet (5 mg total) by mouth daily.   donepezil  10 MG tablet Commonly known as: ARICEPT  Take 1 tablet (10 mg total) by mouth at bedtime.   ELDERBERRY PO Take 1 tablet by mouth daily.   fluticasone  50 MCG/ACT nasal spray Commonly known as: FLONASE  Place 2 sprays into both nostrils daily.   gabapentin  100 MG capsule Commonly known as: NEURONTIN  Take 2 capsules (200 mg total) by mouth at bedtime. What changed: how much to take   Grape Seed Extract 50 MG Caps Take 1,300 mg by mouth daily.   Ipratropium-Albuterol  20-100 MCG/ACT Aers respimat Commonly known as: COMBIVENT  Inhale 1 puff into the lungs every 6 (six) hours.   montelukast  10 MG tablet Commonly known as: SINGULAIR  Take 1 tablet (10 mg total) by mouth at bedtime.   tamsulosin  0.4 MG Caps capsule Commonly known as: FLOMAX  Take 2 capsules (0.8 mg total) by mouth daily after supper.   VITAMIN B-12 ER PO Take 1,000 mcg by mouth daily at 6 (six) AM.   warfarin 5 MG tablet Commonly known as: COUMADIN  Take 1 tablet (5 mg total) by mouth daily.        Allergies: No Known Allergies  Family History:  Family History  Problem Relation Age of Onset   Stroke Mother    Cerebral aneurysm Father     Social History:  reports that he has never smoked. He has never used smokeless tobacco. He reports that he does not drink alcohol and does not use drugs.   Physical Exam: BP 133/73   Pulse 64   Ht 5' 9 (1.753 m)   Wt 170 lb (77.1 kg)   BMI 25.10 kg/m   Constitutional:  Alert, No acute distress. HEENT: [address] AT Respiratory: Normal respiratory effort, no increased work of breathing. Psychiatric: Normal mood and affect.  Laboratory Data:  Urinalysis Microscopy negative  Pertinent Imaging: CT images were personally reviewed and interpreted.  Prostate volume calculated at 82  cc  Assessment & Plan:    1.  BPH with LUTS Prostate volume by CT 82 cc PVR today 0 mL Presently has no bothersome lower urinary tract symptoms and would not recommend any additional treatment at this time. He is not having orthostatic symptoms on 0.8 mg tamsulosin  If he develops worsening lower urinary tract symptoms would add a 5-ARI based on prostate size Offered annual follow-up however they would prefer to follow-up as needed   Gregg JAYSON Barba, MD  [facility] [address], [address] [place], [place] [address] [phone]

## 2024-09-24 LAB — MICROSCOPIC EXAMINATION

## 2024-09-24 LAB — URINALYSIS, COMPLETE
Bilirubin, UA: NEGATIVE
Glucose, UA: NEGATIVE
Leukocytes,UA: NEGATIVE
Nitrite, UA: NEGATIVE
Specific Gravity, UA: 1.03 (ref 1.005–1.030)
Urobilinogen, Ur: 0.2 mg/dL (ref 0.2–1.0)
pH, UA: 6 (ref 5.0–7.5)

## 2024-10-05 ENCOUNTER — Inpatient Hospital Stay: Attending: Internal Medicine

## 2024-10-05 DIAGNOSIS — Z7901 Long term (current) use of anticoagulants: Secondary | ICD-10-CM

## 2024-10-05 DIAGNOSIS — I82512 Chronic embolism and thrombosis of left femoral vein: Secondary | ICD-10-CM | POA: Diagnosis present

## 2024-10-05 LAB — PROTIME-INR
INR: 2.3 — ABNORMAL HIGH (ref 0.8–1.2)
Prothrombin Time: 26.8 s — ABNORMAL HIGH (ref 11.4–15.2)

## 2024-10-07 ENCOUNTER — Other Ambulatory Visit: Payer: Self-pay

## 2024-10-07 NOTE — Patient Outreach (Signed)
 Complex Care Management   Visit Note  10/07/2024  Name:  Gregg Thomas MRN: [id number] DOB: 05-29-1931  Situation: Referral received for Complex Care Management related to Impaired Mobility  I obtained verbal consent from Patient.  Visit completed with Mr. Kana and Mrs Werts   on the phone.  Reports a fall on 10/06/24, fell on his bottom, has a wound on his left hand. Wife is requesting home health PT/OT due to patient is losing strength in his legs.  PCP ordered back in August, [facility] accepted and made a visit, wife turned them away after first visit because she was confused that they showed up instead of [facility].     Background:   Past Medical History:  Diagnosis Date   Chronic kidney disease    Clotting disorder    Dementia (HCC)    Heart murmur    Skin cancer     Assessment: Patient Reported Symptoms:  Cognitive Cognitive Status: Able to follow simple commands, Requires Assistance Decision Making (Assessment performed with patient and wife/cargiver on speaker phone.)      Neurological Neurological Review of Symptoms: No symptoms reported    HEENT HEENT Symptoms Reported: No symptoms reported      Cardiovascular Cardiovascular Symptoms Reported: Other: (Chronic right leg and right foot is still swollen, tries to keep elevated.) Does patient have uncontrolled Hypertension?: No    Respiratory Respiratory Symptoms Reported: No symptoms reported    Endocrine Endocrine Symptoms Reported: Not assessed    Gastrointestinal Gastrointestinal Symptoms Reported: No symptoms reported      Genitourinary Genitourinary Symptoms Reported: No symptoms reported Additional Genitourinary Details: Saw Dr. Twylla 09/23/24, no new therapies, continue tamsulosin , discharged patient to PCP care unless new concerns arise. Genitourinary Self-Management Outcome: 4 (good)  Integumentary Additional Integumentary Details: When he fell 10/06/24, scraped up his left hand, still  bleeding some, wife is cleaning wound, applying dressing daily    Musculoskeletal Musculoskelatal Symptoms Reviewed: Unsteady gait Additional Musculoskeletal Details: Per wife, patient is deconditioning, losing strength.Wife will discuss getting home health PT through [facility] at 10/12/24 OV with PCP. It was previously ordered with [facility], last visit was 08/11/24 but wife/patient preferred [facility] and will relay request to PCP. Musculoskeletal Self-Management Outcome: 2 (bad) Musculoskeletal Comment: [place] in the garage 10/06/24, fell back on his bottom, scraped his left hand. Falls in the past year?: Yes    Psychosocial       Quality of Family Relationships: helpful, involved, supportive    10/07/2024    PHQ2-9 Depression Screening   Little interest or pleasure in doing things    Feeling down, depressed, or hopeless    PHQ-2 - Total Score    Trouble falling or staying asleep, or sleeping too much    Feeling tired or having little energy    Poor appetite or overeating     Feeling bad about yourself - or that you are a failure or have let yourself or your family down    Trouble concentrating on things, such as reading the newspaper or watching television    Moving or speaking so slowly that other people could have noticed.  Or the opposite - being so fidgety or restless that you have been moving around a lot more than usual    Thoughts that you would be better off dead, or hurting yourself in some way    PHQ2-9 Total Score    If you checked off any problems, how difficult have these problems made it for you to do  your work, take care of things at home, or get along with other people    Depression Interventions/Treatment      There were no vitals filed for this visit.  MEDICATIONS: Denies concerns with med, no problems with refills.   Recommendation:   Continue Current Plan of Care PCP visit scheduled 10/12/24, wife will speak to PCP about HHPT/OT, will request  [facility]  Follow Up Plan:   Telephone follow-up in 1 month  Santana Stamp BSN, CCM [place]  VBCI Population Health RN Care Manager Direct Dial: [phone]  Fax: [phone]

## 2024-10-07 NOTE — Patient Instructions (Signed)
 Visit Information  Thank you for taking time to visit with me today. Please don't hesitate to contact me if I can be of assistance to you before our next scheduled appointment.  Your next care management appointment is by telephone on Wednesday, November 5th at 2:00pm   Please call the care guide team at [phone] if you need to cancel, schedule, or reschedule an appointment.   A reminder to ALL patients/family/friends, please call the [place]  National Suicide Prevention Lifeline: 613 843 3048 or TTY: (979)371-3573 TTY [phone]) to talk to a trained counselor if you are experiencing a Mental Health or Behavioral Health Crisis or need someone to talk to.  Santana Stamp BSN, CCM [place]  VBCI Population Health RN Care Manager Direct Dial: [phone]  Fax: [phone]

## 2024-10-12 ENCOUNTER — Encounter: Payer: Self-pay | Admitting: Family Medicine

## 2024-10-12 ENCOUNTER — Ambulatory Visit (INDEPENDENT_AMBULATORY_CARE_PROVIDER_SITE_OTHER): Admitting: Family Medicine

## 2024-10-12 VITALS — BP 136/78 | HR 60 | Temp 97.6°F | Ht 67.0 in | Wt 169.1 lb

## 2024-10-12 DIAGNOSIS — Z23 Encounter for immunization: Secondary | ICD-10-CM

## 2024-10-12 DIAGNOSIS — I10 Essential (primary) hypertension: Secondary | ICD-10-CM

## 2024-10-12 DIAGNOSIS — F03A0 Unspecified dementia, mild, without behavioral disturbance, psychotic disturbance, mood disturbance, and anxiety: Secondary | ICD-10-CM

## 2024-10-12 DIAGNOSIS — R413 Other amnesia: Secondary | ICD-10-CM

## 2024-10-12 DIAGNOSIS — N1832 Chronic kidney disease, stage 3b: Secondary | ICD-10-CM

## 2024-10-12 DIAGNOSIS — R410 Disorientation, unspecified: Secondary | ICD-10-CM

## 2024-10-12 DIAGNOSIS — R29898 Other symptoms and signs involving the musculoskeletal system: Secondary | ICD-10-CM

## 2024-10-12 DIAGNOSIS — E538 Deficiency of other specified B group vitamins: Secondary | ICD-10-CM

## 2024-10-12 DIAGNOSIS — E2089 Other specified hypoparathyroidism: Secondary | ICD-10-CM

## 2024-10-12 DIAGNOSIS — R42 Dizziness and giddiness: Secondary | ICD-10-CM

## 2024-10-12 DIAGNOSIS — R269 Unspecified abnormalities of gait and mobility: Secondary | ICD-10-CM

## 2024-10-12 DIAGNOSIS — R2689 Other abnormalities of gait and mobility: Secondary | ICD-10-CM

## 2024-10-12 NOTE — Progress Notes (Signed)
 BP 136/78   Pulse 60   Temp 97.6 F (36.4 C) (Oral)   Ht 5' 7 (1.702 m)   Wt 169 lb 2 oz (76.7 kg)   BMI 26.49 kg/m    Subjective:    Patient ID: Gregg Thomas, male    DOB: [birth year], 89 y.o.   MRN: [id number]  HPI: Gregg Thomas is a 89 y.o. male  Chief Complaint  Patient presents with   Leg Swelling    Right leg   B12 Injection   HYPERTENSION  Hypertension status: controlled  Satisfied with current treatment? yes Duration of hypertension: chronic BP monitoring frequency:  not checking BP medication side effects:  no Medication compliance: excellent compliance Previous BP meds:amlodipine  Aspirin: no Recurrent headaches: no Visual changes: no Palpitations: no Dyspnea: no Chest pain: no Lower extremity edema: no Dizzy/lightheaded: no  Continues to feel weak and have concerns with his gait. Would like PT to come out again. No other concerns.   Relevant past medical, surgical, family and social history reviewed and updated as indicated. Interim medical history since our last visit reviewed. Allergies and medications reviewed and updated.  Review of Systems  Constitutional: Negative.   Respiratory: Negative.    Cardiovascular: Negative.   Musculoskeletal: Negative.   Neurological: Negative.   Psychiatric/Behavioral: Negative.      Per HPI unless specifically indicated above     Objective:    BP 136/78   Pulse 60   Temp 97.6 F (36.4 C) (Oral)   Ht 5' 7 (1.702 m)   Wt 169 lb 2 oz (76.7 kg)   BMI 26.49 kg/m   Wt Readings from Last 3 Encounters:  10/12/24 169 lb 2 oz (76.7 kg)  09/23/24 170 lb (77.1 kg)  09/04/24 169 lb (76.7 kg)    Physical Exam Vitals and nursing note reviewed.  Constitutional:      General: He is not in acute distress.    Appearance: Normal appearance. He is not ill-appearing, toxic-appearing or diaphoretic.  HENT:     Head: Normocephalic and atraumatic.     Right Ear: External ear normal.     Left  Ear: External ear normal.     Nose: Nose normal.     Mouth/Throat:     Mouth: Mucous membranes are moist.     Pharynx: Oropharynx is clear.  Eyes:     General: No scleral icterus.       Right eye: No discharge.        Left eye: No discharge.     Extraocular Movements: Extraocular movements intact.     Conjunctiva/sclera: Conjunctivae normal.     Pupils: Pupils are equal, round, and reactive to light.  Cardiovascular:     Rate and Rhythm: Normal rate and regular rhythm.     Pulses: Normal pulses.     Heart sounds: Normal heart sounds. No murmur heard.    No friction rub. No gallop.  Pulmonary:     Effort: Pulmonary effort is normal. No respiratory distress.     Breath sounds: Normal breath sounds. No stridor. No wheezing, rhonchi or rales.  Chest:     Chest wall: No tenderness.  Musculoskeletal:        General: Normal range of motion.     Cervical back: Normal range of motion and neck supple.  Skin:    General: Skin is warm and dry.     Capillary Refill: Capillary refill takes less than 2 seconds.     Coloration: Skin  is not jaundiced or pale.     Findings: No bruising, erythema, lesion or rash.  Neurological:     General: No focal deficit present.     Mental Status: He is alert and oriented to person, place, and time. Mental status is at baseline.  Psychiatric:        Mood and Affect: Mood normal.        Behavior: Behavior normal.        Thought Content: Thought content normal.        Judgment: Judgment normal.     Results for orders placed or performed in visit on 10/12/24  Basic metabolic panel with GFR   Collection Time: 10/12/24  2:58 PM  Result Value Ref Range   Glucose 77 70 - 99 mg/dL   BUN 22 10 - 36 mg/dL   Creatinine, Ser 8.09 (H) 0.76 - 1.27 mg/dL   eGFR 33 (L) >40 fO/fpw/8.26   BUN/Creatinine Ratio 12 10 - 24   Sodium 143 134 - 144 mmol/L   Potassium 5.2 3.5 - 5.2 mmol/L   Chloride 109 (H) 96 - 106 mmol/L   CO2 20 20 - 29 mmol/L   Calcium 11.0 (H) 8.6  - 10.2 mg/dL      Assessment & Plan:   Problem List Items Addressed This Visit       Cardiovascular and Mediastinum   HTN (hypertension) - Primary   Under good control on current regimen. Continue current regimen. Continue to monitor. Call with any concerns. Refills given. Labs drawn today.       Relevant Orders   Ambulatory referral to Home Health   Basic metabolic panel with GFR (Completed)     Endocrine   Secondary hypoparathyroidism   Relevant Orders   Ambulatory referral to Home Health     Nervous and Auditory   Dementia [facility])   Relevant Orders   Ambulatory referral to Home Health   Confusion   Relevant Orders   Ambulatory referral to Home Health     Musculoskeletal and Integument   Muscular deconditioning   Relevant Orders   Ambulatory referral to Home Health     Genitourinary   CKD stage 3b, GFR 30-44 ml/min (HCC)   Relevant Orders   Ambulatory referral to Home Health     Other   Memory loss   Relevant Orders   Ambulatory referral to Home Health   Balance problem   Relevant Orders   Ambulatory referral to Home Health   Dizziness   Relevant Orders   Ambulatory referral to Home Health   Other Visit Diagnoses       Gait abnormality       Relevant Orders   Ambulatory referral to Home Health     Needs flu shot       Flu shot given today.   Relevant Orders   Flu vaccine HIGH DOSE PF(Fluzone Trivalent) (Completed)        Follow up plan: Return in about 3 months (around 01/12/2025) for and nurse visits next 2 months.

## 2024-10-13 LAB — BASIC METABOLIC PANEL WITH GFR
BUN/Creatinine Ratio: 12 (ref 10–24)
BUN: 22 mg/dL (ref 10–36)
CO2: 20 mmol/L (ref 20–29)
Calcium: 11 mg/dL — ABNORMAL HIGH (ref 8.6–10.2)
Chloride: 109 mmol/L — ABNORMAL HIGH (ref 96–106)
Creatinine, Ser: 1.9 mg/dL — ABNORMAL HIGH (ref 0.76–1.27)
Glucose: 77 mg/dL (ref 70–99)
Potassium: 5.2 mmol/L (ref 3.5–5.2)
Sodium: 143 mmol/L (ref 134–144)
eGFR: 33 mL/min/1.73 — ABNORMAL LOW (ref >59)

## 2024-10-15 ENCOUNTER — Ambulatory Visit: Payer: Self-pay | Admitting: Family Medicine

## 2024-10-18 NOTE — Assessment & Plan Note (Signed)
 Under good control on current regimen. Continue current regimen. Continue to monitor. Call with any concerns. Refills given. Labs drawn today.

## 2024-10-22 NOTE — Progress Notes (Signed)
 Faxed labs

## 2024-10-28 ENCOUNTER — Telehealth: Payer: Self-pay | Admitting: Family Medicine

## 2024-10-28 NOTE — Telephone Encounter (Signed)
 Referral team, please see Dr. Ferdie message. Can you guys check on this for her?

## 2024-10-28 NOTE — Telephone Encounter (Signed)
 I got a message saying that [facility] does not service [place]gg Thomas, [place]- they have had [facility] come out for PT multiple times in the past- has their catchment area changed? Can we please check on this?

## 2024-11-04 ENCOUNTER — Other Ambulatory Visit: Payer: Self-pay

## 2024-11-04 NOTE — Patient Outreach (Unsigned)
 Complex Care Management   Visit Note  11/04/2024  Name:  Gregg Thomas MRN: [id number] DOB: 07-30-1931  Situation: Referral received for Complex Care Management related to {Criteria:32550} I obtained verbal consent from {CHL AMB Patient/Caregiver:28184}.  Visit completed with {CHL AMB Patient/Caregiver:28184}  {VISIT LOCATION:32553}  Background:   Past Medical History:  Diagnosis Date   Chronic kidney disease    Clotting disorder    Dementia (HCC)    Heart murmur    Skin cancer     Assessment: Patient Reported Symptoms:  Cognitive Cognitive Status: Requires Assistance Decision Making, Able to follow simple commands (Assessment performed with wife/DPR on this contact, patient was sleeping.)      Neurological Neurological Review of Symptoms: No symptoms reported    HEENT HEENT Symptoms Reported: Nasal discharge HEENT Management Strategies: Medication therapy HEENT Self-Management Outcome: 3 (uncertain)    Cardiovascular Cardiovascular Symptoms Reported: Other: Other Cardiovascular Symptoms: Chronic right leg and right foot is still swollen, tries to keep elevated - this is at baseline. Does patient have uncontrolled Hypertension?: No Cardiovascular Self-Management Outcome: 4 (good)  Respiratory Respiratory Symptoms Reported: No symptoms reported Respiratory Self-Management Outcome: 4 (good)  Endocrine Endocrine Symptoms Reported: Not assessed Is patient diabetic?: No    Gastrointestinal Gastrointestinal Symptoms Reported: No symptoms reported      Genitourinary Genitourinary Symptoms Reported: Frequency Genitourinary Self-Management Outcome: 4 (good)  Integumentary Integumentary Symptoms Reported: No symptoms reported    Musculoskeletal Musculoskelatal Symptoms Reviewed: Unsteady gait Additional Musculoskeletal Details: Receiving HHPT with [facility], unsure how many times a week as of yet, just started. Musculoskeletal Management Strategies: Medical device       Psychosocial Psychosocial Symptoms Reported: Not assessed          11/04/2024    PHQ2-9 Depression Screening   Little interest or pleasure in doing things    Feeling down, depressed, or hopeless    PHQ-2 - Total Score    Trouble falling or staying asleep, or sleeping too much    Feeling tired or having little energy    Poor appetite or overeating     Feeling bad about yourself - or that you are a failure or have let yourself or your family down    Trouble concentrating on things, such as reading the newspaper or watching television    Moving or speaking so slowly that other people could have noticed.  Or the opposite - being so fidgety or restless that you have been moving around a lot more than usual    Thoughts that you would be better off dead, or hurting yourself in some way    PHQ2-9 Total Score    If you checked off any problems, how difficult have these problems made it for you to do your work, take care of things at home, or get along with other people    Depression Interventions/Treatment      There were no vitals filed for this visit.  Medications Reviewed Today   Medications were not reviewed in this encounter     Recommendation:   {RECOMMENDATONS:32554}  Follow Up Plan:   {FOLLOWUP:32559}  SIG ***

## 2024-11-05 ENCOUNTER — Inpatient Hospital Stay: Attending: Internal Medicine

## 2024-11-05 DIAGNOSIS — Z7901 Long term (current) use of anticoagulants: Secondary | ICD-10-CM

## 2024-11-05 DIAGNOSIS — I82512 Chronic embolism and thrombosis of left femoral vein: Secondary | ICD-10-CM | POA: Diagnosis present

## 2024-11-05 LAB — PROTIME-INR
INR: 2.1 — ABNORMAL HIGH (ref 0.8–1.2)
Prothrombin Time: 24.6 s — ABNORMAL HIGH (ref 11.4–15.2)

## 2024-11-05 NOTE — Patient Instructions (Signed)
 Visit Information  Thank you for taking time to visit with me today. Please don't hesitate to contact me if I can be of assistance to you before our next scheduled appointment.  Your next care management appointment is by telephone on Wednesday, November 3rd at 2:00pm.  Please call the care guide team at [phone] if you need to cancel, schedule, or reschedule an appointment.   A reminder to ALL patients/family/friends, please call the [place]  National Suicide Prevention Lifeline: 5481638153 or TTY: (769) 181-8084 TTY (430) 561-9868) to talk to a trained counselor if you are experiencing a Mental Health or Behavioral Health Crisis or need someone to talk to.  Santana Stamp BSN, CCM [place]  VBCI Population Health RN Care Manager Direct Dial: [phone]  Fax: [phone]

## 2024-11-09 ENCOUNTER — Ambulatory Visit

## 2024-11-09 ENCOUNTER — Ambulatory Visit (INDEPENDENT_AMBULATORY_CARE_PROVIDER_SITE_OTHER): Admitting: Family Medicine

## 2024-11-09 ENCOUNTER — Encounter: Payer: Self-pay | Admitting: Family Medicine

## 2024-11-09 ENCOUNTER — Telehealth: Payer: Self-pay

## 2024-11-09 VITALS — BP 153/75 | HR 58 | Temp 97.8°F

## 2024-11-09 DIAGNOSIS — E538 Deficiency of other specified B group vitamins: Secondary | ICD-10-CM

## 2024-11-09 DIAGNOSIS — R5383 Other fatigue: Secondary | ICD-10-CM

## 2024-11-09 DIAGNOSIS — R300 Dysuria: Secondary | ICD-10-CM

## 2024-11-09 LAB — URINALYSIS, ROUTINE W REFLEX MICROSCOPIC
Bilirubin, UA: NEGATIVE
Glucose, UA: NEGATIVE
Ketones, UA: NEGATIVE
Leukocytes,UA: NEGATIVE
Nitrite, UA: NEGATIVE
Specific Gravity, UA: 1.03 (ref 1.005–1.030)
Urobilinogen, Ur: 0.2 mg/dL (ref 0.2–1.0)
pH, UA: 5.5 (ref 5.0–7.5)

## 2024-11-09 LAB — MICROSCOPIC EXAMINATION
Bacteria, UA: NONE SEEN
Epithelial Cells (non renal): NONE SEEN /HPF (ref 0–10)
WBC, UA: NONE SEEN /HPF (ref 0–5)

## 2024-11-09 NOTE — Patient Outreach (Signed)
 Incoming phone message from [place], patient's wife, reporting patient has been weaker, sleeping more than usual, unsteady on his feet.  She is bringing him in today for 1:30 appt for B12 injection, requesting U/A and lab work for calcium level as these were problematic in the past when he exhibited these symptoms.  This RNCM contacted [facility], staff will relay message to Dr.Johnson, Iris will call Mrs. Chevere with the plan.  I called Mrs. Henkin back and forwarded the message.

## 2024-11-09 NOTE — Progress Notes (Signed)
 BP (!) 153/75   Pulse (!) 58   Temp 97.8 F (36.6 C) (Oral)   SpO2 97%    Subjective:    Patient ID: Gregg Thomas, male    DOB: 1931/05/19, 89 y.o.   MRN: [id number]  HPI: Gregg Thomas is a 89 y.o. male  Chief Complaint  Patient presents with   Fatigue    3-4 days--not drinking a lot fluids   Wife notes that his gait has been off. He had some weakness in the shower about a week ago. She notes that he is sleeping most of the time. She notes that he has been peeing a bit more at night. Started PT last week. She notes that his pants are not fitting the way they normally do. Nothing has particularly changed. His wife does note that this is more of the chronic issues that have been going on, but she is concerned that he may have a UTI due to the increased frequency. No other concerns or complaints at this time.   Relevant past medical, surgical, family and social history reviewed and updated as indicated. Interim medical history since our last visit reviewed. Allergies and medications reviewed and updated.  Review of Systems  Constitutional:  Positive for fatigue. Negative for activity change, appetite change, chills, diaphoresis, fever and unexpected weight change.  Respiratory: Negative.    Cardiovascular: Negative.   Genitourinary:  Positive for frequency. Negative for decreased urine volume, difficulty urinating, dysuria, enuresis, flank pain, genital sores, hematuria, penile discharge, penile pain, penile swelling, scrotal swelling, testicular pain and urgency.  Musculoskeletal:  Positive for back pain and myalgias. Negative for arthralgias, gait problem, joint swelling, neck pain and neck stiffness.  Neurological:  Positive for weakness. Negative for dizziness, tremors, seizures, syncope, facial asymmetry, speech difficulty, light-headedness, numbness and headaches.  Psychiatric/Behavioral: Negative.      Per HPI unless specifically indicated above      Objective:    BP (!) 153/75   Pulse (!) 58   Temp 97.8 F (36.6 C) (Oral)   SpO2 97%   Wt Readings from Last 3 Encounters:  10/12/24 169 lb 2 oz (76.7 kg)  09/23/24 170 lb (77.1 kg)  09/04/24 169 lb (76.7 kg)    Physical Exam Vitals and nursing note reviewed.  Constitutional:      General: He is not in acute distress.    Appearance: Normal appearance. He is not ill-appearing, toxic-appearing or diaphoretic.  HENT:     Head: Normocephalic and atraumatic.     Right Ear: External ear normal.     Left Ear: External ear normal.     Nose: Nose normal.     Mouth/Throat:     Mouth: Mucous membranes are moist.     Pharynx: Oropharynx is clear.  Eyes:     General: No scleral icterus.       Right eye: No discharge.        Left eye: No discharge.     Extraocular Movements: Extraocular movements intact.     Conjunctiva/sclera: Conjunctivae normal.     Pupils: Pupils are equal, round, and reactive to light.  Cardiovascular:     Rate and Rhythm: Normal rate and regular rhythm.     Pulses: Normal pulses.     Heart sounds: Normal heart sounds. No murmur heard.    No friction rub. No gallop.  Pulmonary:     Effort: Pulmonary effort is normal. No respiratory distress.     Breath sounds: Normal breath  sounds. No stridor. No wheezing, rhonchi or rales.  Chest:     Chest wall: No tenderness.  Musculoskeletal:        General: Normal range of motion.     Cervical back: Normal range of motion and neck supple.  Skin:    General: Skin is warm and dry.     Capillary Refill: Capillary refill takes less than 2 seconds.     Coloration: Skin is not jaundiced or pale.     Findings: No bruising, erythema, lesion or rash.  Neurological:     General: No focal deficit present.     Mental Status: He is alert and oriented to person, place, and time. Mental status is at baseline.  Psychiatric:        Mood and Affect: Mood normal.        Behavior: Behavior normal.        Thought Content: Thought  content normal.        Judgment: Judgment normal.     Results for orders placed or performed in visit on 11/05/24  Protime-INR   Collection Time: 11/05/24  1:42 PM  Result Value Ref Range   Prothrombin Time 24.6 (H) 11.4 - 15.2 seconds   INR 2.1 (H) 0.8 - 1.2      Assessment & Plan:   Problem List Items Addressed This Visit   None Visit Diagnoses       Fatigue, unspecified type    -  Primary   Acting up again. Will recheck labs. Call with any concerns.   Relevant Orders   Basic metabolic panel with GFR   CBC with Differential/Platelet     Dysuria       UA clear. Will send urine for culture. Await results.   Relevant Orders   Urinalysis, Routine w reflex microscopic   Urine Culture        Follow up plan: Return for As scheduled, please add AWV with CMA to appt on 12/8.  >30 minutes spent with patient and his wife.

## 2024-11-10 ENCOUNTER — Ambulatory Visit: Payer: Self-pay | Admitting: Family Medicine

## 2024-11-10 LAB — CBC WITH DIFFERENTIAL/PLATELET
Basophils Absolute: 0 x10E3/uL (ref 0.0–0.2)
Basos: 1 %
EOS (ABSOLUTE): 0.2 x10E3/uL (ref 0.0–0.4)
Eos: 5 %
Hematocrit: 42.6 % (ref 37.5–51.0)
Hemoglobin: 14 g/dL (ref 13.0–17.7)
Immature Grans (Abs): 0 x10E3/uL (ref 0.0–0.1)
Immature Granulocytes: 0 %
Lymphocytes Absolute: 1.1 x10E3/uL (ref 0.7–3.1)
Lymphs: 25 %
MCH: 33.1 pg — ABNORMAL HIGH (ref 26.6–33.0)
MCHC: 32.9 g/dL (ref 31.5–35.7)
MCV: 101 fL — ABNORMAL HIGH (ref 79–97)
Monocytes Absolute: 0.5 x10E3/uL (ref 0.1–0.9)
Monocytes: 12 %
Neutrophils Absolute: 2.5 x10E3/uL (ref 1.4–7.0)
Neutrophils: 57 %
Platelets: 194 x10E3/uL (ref 150–450)
RBC: 4.23 x10E6/uL (ref 4.14–5.80)
RDW: 13.6 % (ref 11.6–15.4)
WBC: 4.4 x10E3/uL (ref 3.4–10.8)

## 2024-11-10 LAB — BASIC METABOLIC PANEL WITH GFR
BUN/Creatinine Ratio: 12 (ref 10–24)
BUN: 24 mg/dL (ref 10–36)
CO2: 24 mmol/L (ref 20–29)
Calcium: 11.4 mg/dL — ABNORMAL HIGH (ref 8.6–10.2)
Chloride: 107 mmol/L — ABNORMAL HIGH (ref 96–106)
Creatinine, Ser: 2.04 mg/dL — ABNORMAL HIGH (ref 0.76–1.27)
Glucose: 93 mg/dL (ref 70–99)
Potassium: 4.7 mmol/L (ref 3.5–5.2)
Sodium: 142 mmol/L (ref 134–144)
eGFR: 30 mL/min/1.73 — ABNORMAL LOW (ref >59)

## 2024-11-11 DIAGNOSIS — Z7951 Long term (current) use of inhaled steroids: Secondary | ICD-10-CM

## 2024-11-11 DIAGNOSIS — F03A0 Unspecified dementia, mild, without behavioral disturbance, psychotic disturbance, mood disturbance, and anxiety: Secondary | ICD-10-CM | POA: Diagnosis not present

## 2024-11-11 DIAGNOSIS — Z7901 Long term (current) use of anticoagulants: Secondary | ICD-10-CM

## 2024-11-11 DIAGNOSIS — N1832 Chronic kidney disease, stage 3b: Secondary | ICD-10-CM | POA: Diagnosis not present

## 2024-11-11 DIAGNOSIS — E20811 Secondary hypoparathyroidism in diseases classified elsewhere: Secondary | ICD-10-CM | POA: Diagnosis not present

## 2024-11-11 DIAGNOSIS — I129 Hypertensive chronic kidney disease with stage 1 through stage 4 chronic kidney disease, or unspecified chronic kidney disease: Secondary | ICD-10-CM | POA: Diagnosis not present

## 2024-11-11 LAB — URINE CULTURE

## 2024-12-02 ENCOUNTER — Other Ambulatory Visit: Payer: Self-pay | Admitting: Family Medicine

## 2024-12-02 ENCOUNTER — Telehealth: Payer: Self-pay

## 2024-12-04 ENCOUNTER — Inpatient Hospital Stay

## 2024-12-04 NOTE — Telephone Encounter (Signed)
 Requested Prescriptions  Pending Prescriptions Disp Refills   montelukast  (SINGULAIR ) 10 MG tablet [Pharmacy Med Name: MONTELUKAST  SOD 10 MG TABLET] 90 tablet 1    Sig: Take 1 tablet (10 mg total) by mouth at bedtime.     Pulmonology:  Leukotriene Inhibitors Passed - 12/04/2024  2:51 PM      Passed - Valid encounter within last 12 months    Recent Outpatient Visits           3 weeks ago Fatigue, unspecified type   [place] [facility] [place], Megan P, DO   1 month ago Primary hypertension   [place] [facility] [place], [place] P, DO   3 months ago Benign prostatic hyperplasia with nocturia   [place] [facility] [place], [place] P, DO   4 months ago Hypercalcemia   [place] [facility] [place], Megan P, DO   5 months ago AKI (acute kidney injury)   [place] [facility] [place], Megan P, DO               donepezil  (ARICEPT ) 10 MG tablet [Pharmacy Med Name: DONEPEZIL  HCL 10 MG TABLET] 90 tablet 1    Sig: Take 1 tablet (10 mg total) by mouth at bedtime.     Neurology:  Alzheimer's Agents Passed - 12/04/2024  2:51 PM      Passed - Valid encounter within last 6 months    Recent Outpatient Visits           3 weeks ago Fatigue, unspecified type   [place] [facility] [place], Megan P, DO   1 month ago Primary hypertension   [place] [facility] [place], [place] P, DO   3 months ago Benign prostatic hyperplasia with nocturia   [place] [facility] [place], Megan P, DO   4 months ago Hypercalcemia   [place] [facility] [place], Megan P, DO   5 months ago AKI (acute kidney injury)   [place] [facility] [place], Megan P, DO               amLODipine  (NORVASC ) 2.5 MG tablet [Pharmacy Med Name: AMLODIPINE  BESYLATE 2.5 MG TAB] 90 tablet 1    Sig: Take 1 tablet (2.5 mg total) by mouth daily.     Cardiovascular: Calcium  Channel Blockers 2 Failed - 12/04/2024  2:51 PM      Failed - Last BP in normal range    BP Readings from Last 1 Encounters:  11/09/24 (!) 153/75         Passed - Last Heart Rate in normal range    Pulse Readings from Last 1 Encounters:  11/09/24 (!) 58         Passed - Valid encounter within last 6 months    Recent Outpatient Visits           3 weeks ago Fatigue, unspecified type   [place] [facility] [place], [place], DO   1 month ago Primary hypertension   [place] [facility] [place], Megan P, DO   3 months ago Benign prostatic hyperplasia with nocturia   [place] [facility] [place], Megan P, DO   4 months ago Hypercalcemia   [place] [facility] [place], Megan P, DO   5 months ago AKI (acute kidney injury)    [facility] [place], Megan P, DO

## 2024-12-07 ENCOUNTER — Ambulatory Visit

## 2024-12-08 ENCOUNTER — Inpatient Hospital Stay: Attending: Internal Medicine

## 2024-12-08 DIAGNOSIS — I82512 Chronic embolism and thrombosis of left femoral vein: Secondary | ICD-10-CM | POA: Diagnosis present

## 2024-12-08 DIAGNOSIS — Z79899 Other long term (current) drug therapy: Secondary | ICD-10-CM | POA: Diagnosis not present

## 2024-12-08 DIAGNOSIS — Z7901 Long term (current) use of anticoagulants: Secondary | ICD-10-CM

## 2024-12-08 LAB — PROTIME-INR
INR: 2.5 — ABNORMAL HIGH (ref 0.8–1.2)
Prothrombin Time: 28.4 s — ABNORMAL HIGH (ref 11.4–15.2)

## 2024-12-11 ENCOUNTER — Other Ambulatory Visit: Payer: Self-pay

## 2024-12-13 NOTE — Patient Instructions (Signed)
 Visit Information  Thank you for taking time to visit with me today. Please don't hesitate to contact me if I can be of assistance to you before our next scheduled appointment.  Your next care management appointment is by telephone on Friday, January 9th at 2:30pm.  Please call the care guide team at [phone] if you need to cancel, schedule, or reschedule an appointment.   Please call the [place]  National Suicide Prevention Lifeline: 302 408 7094 or TTY: 843-632-2513 TTY 973 590 5110) to talk to a trained counselor if you are experiencing a Mental Health or Behavioral Health Crisis or need someone to talk to.  Santana Stamp BSN, CCM [place]  VBCI Population Health RN Care Manager Direct Dial: [phone]  Fax: [phone]

## 2024-12-13 NOTE — Patient Outreach (Signed)
 Complex Care Management   Visit Note  12/13/2024  Name:  Gregg Thomas MRN: [id number] DOB: [birth year]  Situation: Referral received for Complex Care Management related to Chronic Kidney Disease and Impaired Mobility I obtained verbal consent from Caregiver.  Visit completed with Wife/DPR/Caregiver, Gregg Thomas  on the phone.  Attended Neurology consult 12/04/24 with recommendations to continue Aricept  10mg  once at night, get annual eye exam, engage in cognitive activities such as crossword puzzles/Sudoku, engage in physical activity to reduce daytime sleepiness  Background:   Past Medical History:  Diagnosis Date   Chronic kidney disease    Clotting disorder    Dementia (HCC)    Heart murmur    Skin cancer     Assessment: Patient Reported Symptoms:  Cognitive Cognitive Status: Requires Assistance Decision Making, Able to follow simple commands (Assessment performed with wife/DPR on this contact) Cognitive/Intellectual Conditions Management [RPT]: Other Other: Mild Dementia, short term memory loss, saw Neurology 12/04/24, orders to continue Aricept  10mg  once at night.   Health Maintenance Behaviors: Social activities, Exercise Health Facilitated by: Healthy diet  Neurological Neurological Review of Symptoms: No symptoms reported    HEENT HEENT Symptoms Reported: Other: HEENT Comment: Needs updated eye exam.  Wife will make appointment with [facility] in the new year.    Cardiovascular Cardiovascular Symptoms Reported: Not assessed    Respiratory Respiratory Symptoms Reported: No symptoms reported    Endocrine Endocrine Symptoms Reported: Not assessed    Gastrointestinal Gastrointestinal Symptoms Reported: Not assessed      Genitourinary Genitourinary Symptoms Reported: Not assessed    Integumentary Integumentary Symptoms Reported: Not assessed    Musculoskeletal Musculoskelatal Symptoms Reviewed: Unsteady gait        Psychosocial Psychosocial Symptoms  Reported: No symptoms reported          12/13/2024    PHQ2-9 Depression Screening   Little interest or pleasure in doing things    Feeling down, depressed, or hopeless    PHQ-2 - Total Score    Trouble falling or staying asleep, or sleeping too much    Feeling tired or having little energy    Poor appetite or overeating     Feeling bad about yourself - or that you are a failure or have let yourself or your family down    Trouble concentrating on things, such as reading the newspaper or watching television    Moving or speaking so slowly that other people could have noticed.  Or the opposite - being so fidgety or restless that you have been moving around a lot more than usual    Thoughts that you would be better off dead, or hurting yourself in some way    PHQ2-9 Total Score    If you checked off any problems, how difficult have these problems made it for you to do your work, take care of things at home, or get along with other people    Depression Interventions/Treatment      There were no vitals filed for this visit.    Medications Reviewed Today     Reviewed by Gregg Thomas LABOR, RN (Registered Nurse) on 12/13/24 at 1816  Med List Status: <None>   Medication Order Taking? Sig Documenting Provider Last Dose Status Informant  albuterol  (VENTOLIN  HFA) 108 (90 Base) MCG/ACT inhaler [id number]  Inhale 2 puffs into the lungs every 6 (six) hours as needed for wheezing or shortness of breath. Thomas, Gregg P, DO  Active Spouse/Significant Other  amLODipine  (NORVASC ) 2.5 MG tablet [id number]  Take 1 tablet (2.5 mg total) by mouth daily. Thomas, Gregg P, DO  Active   ascorbic acid  (VITAMIN C ) 1000 MG tablet [id number]  Take by mouth. [provider]  Active Spouse/Significant Other  cetirizine  (ZYRTEC ) 5 MG tablet [id number]  Take 1 tablet (5 mg total) by mouth daily. Thomas, Gregg P, DO  Active Spouse/Significant Other  Cyanocobalamin  (VITAMIN B-12 ER PO) [id number]  Take 1,000 mcg  by mouth daily at 6 (six) AM. [provider]  Active Spouse/Significant Other  cyanocobalamin  (VITAMIN B12) injection 1,000 mcg [id number]   Thomas, Gregg P, DO  Active   donepezil  (ARICEPT ) 10 MG tablet [id number]  Take 1 tablet (10 mg total) by mouth at bedtime. Thomas Bouchard P, DO  Active   ELDERBERRY PO [id number]  Take 1 tablet by mouth daily. [provider]  Active Spouse/Significant Other  fluticasone  (FLONASE ) 50 MCG/ACT nasal spray [id number]  Place 2 sprays into both nostrils daily. Thomas, Gregg P, DO  Active Spouse/Significant Other  gabapentin  (NEURONTIN ) 100 MG capsule [id number]  Take 2 capsules (200 mg total) by mouth at bedtime.  Patient taking differently: Take 100 mg by mouth at bedtime.   Thomas Bouchard P, DO  Active Spouse/Significant Other           Med Note (Gregg, MEYHONIA Thomas   Wed Jun 10, 2024 11:50 AM) Only taking 1 tablet daily  Grape Seed Extract 50 MG CAPS [id number]  Take 1,300 mg by mouth daily.  [provider]  Active Spouse/Significant Other           Med Note Gregg Thomas   Tue Oct 31, 2021 10:11 AM)    Ipratropium-Albuterol  (COMBIVENT ) 20-100 MCG/ACT AERS respimat [id number]  Inhale 1 puff into the lungs every 6 (six) hours. [provider]  Active Spouse/Significant Other           Med Note Gregg Thomas   Tue Jun 30, 2024  7:10 PM)    montelukast  (SINGULAIR ) 10 MG tablet [id number]  Take 1 tablet (10 mg total) by mouth at bedtime. Thomas, Gregg P, DO  Active   tamsulosin  (FLOMAX ) 0.4 MG CAPS capsule [id number]  Take 2 capsules (0.8 mg total) by mouth daily after supper. Thomas, Gregg P, DO  Active   warfarin (COUMADIN ) 5 MG tablet [id number]  Take 1 tablet (5 mg total) by mouth daily. Gregg Cindy SAUNDERS, MD  Active             Recommendation:   Specialty provider follow-up : Nephrology 12/29/24: Oncology 01/04/25; Neurology 06/01/25 Continue Current Plan of Care Wife will make appt for annual eye exam  after the first of the year.   Follow Up Plan:   Telephone follow-up in 1 month  Thomas Stamp BSN, CCM [place]  VBCI Population Health RN Care Manager Direct Dial: [phone]  Fax: [phone]

## 2024-12-13 NOTE — Patient Outreach (Signed)
 Complex Care Management   Visit Note  12/13/2024  Name:  Gregg Thomas MRN: [id number] DOB: 05/26/1931  Situation: Referral received for Complex Care Management related to Chronic Kidney Disease and Impaired Mobility I obtained verbal consent from Caregiver/wife/DPR.  Visit completed with Gregg Thomas  on the phone.  Wife reports patient had Neurology consult on 12/03/24,   Background:   Past Medical History:  Diagnosis Date   Chronic kidney disease    Clotting disorder    Dementia (HCC)    Heart murmur    Skin cancer     Assessment: Patient Reported Symptoms:  Cognitive Cognitive Status: Requires Assistance Decision Making, Able to follow simple commands (Assessment performed with wife/DPR on this contact)      Neurological Neurological Review of Symptoms: No symptoms reported    HEENT HEENT Symptoms Reported: Other: HEENT Comment: Needs updated eye exam.  Wife will make appointment with [facility] in the new year.    Cardiovascular Cardiovascular Symptoms Reported: Not assessed    Respiratory Respiratory Symptoms Reported: No symptoms reported    Endocrine Endocrine Symptoms Reported: Not assessed    Gastrointestinal Gastrointestinal Symptoms Reported: Not assessed      Genitourinary Genitourinary Symptoms Reported: Not assessed    Integumentary Integumentary Symptoms Reported: Not assessed    Musculoskeletal Musculoskelatal Symptoms Reviewed: Unsteady gait        Psychosocial Psychosocial Symptoms Reported: No symptoms reported          12/13/2024    PHQ2-9 Depression Screening   Little interest or pleasure in doing things    Feeling down, depressed, or hopeless    PHQ-2 - Total Score    Trouble falling or staying asleep, or sleeping too much    Feeling tired or having little energy    Poor appetite or overeating     Feeling bad about yourself - or that you are a failure or have let yourself or your family down    Trouble concentrating on  things, such as reading the newspaper or watching television    Moving or speaking so slowly that other people could have noticed.  Or the opposite - being so fidgety or restless that you have been moving around a lot more than usual    Thoughts that you would be better off dead, or hurting yourself in some way    PHQ2-9 Total Score    If you checked off any problems, how difficult have these problems made it for you to do your work, take care of things at home, or get along with other people    Depression Interventions/Treatment      There were no vitals filed for this visit.    Medications Reviewed Today   Medications were not reviewed in this encounter     Recommendation:   Continue Current Plan of Care  Follow Up Plan:   Telephone follow-up in 1 month  [place]ant[place] Stamp BSN, CCM [place]  VBCI Population Health RN Care Manager Direct Dial: [phone]  Fax: [phone]

## 2024-12-14 ENCOUNTER — Telehealth: Payer: Self-pay | Admitting: Family Medicine

## 2024-12-14 NOTE — Telephone Encounter (Signed)
 Vernell Silvan, RN dropped off Home Health order to be signed by the provider. Vernell is requesting form be FAXED to [phone] within 2-5 days when signed. Document is located in providers folder. Card with fax Number is attached to form.SABRA

## 2024-12-29 NOTE — Progress Notes (Signed)
 Follow Up Visit   Patient Name: Gregg Thomas, male   Patient DOB: 1931-07-14 Date of Service: 12/29/2024  Patient MRN: [id number] Provider Creating Note: Bonnell Sherry, MD  [phone] Primary Care Physician:   [address] Arlyss [facility] [address] Additional Physicians/ Providers:    History of Present Illness Gregg Thomas is a 89 y.o. male who is following up today for chronic kidney disease stage IIIB, proteinuria, hypertension, hypercalcemia.  The patient's chronic kidney disease has been relatively stable recently.  Most recent eGFR office was 34 and as low as 30 at his primary care physician's office.  He has history of hypercalcemia which appears to be worsening again with most recent serum calcium 11.4.  In regards hypertension blood pressure currently 122/74.  Patient also has secondary hyperparathyroidism mostly PTH of 95, phosphorus 2.7.  Medications   Current Outpatient Medications:    [place]-Vitamin C -Zinc  (ELDERBERRY EXTRACT PO), Take by mouth, Disp: , Rfl:    fluticasone  (FLONASE ) 50 MCG/ACT nasal spray, Administer 2 sprays into affected nostril(s) in the morning., Disp: , Rfl:    ipratropium-albuterol  (Combivent  Respimat) 20-100 MCG/ACT inhaler, Inhale 1 puff in the morning and 1 puff at noon and 1 puff in the evening and 1 puff before bedtime., Disp: , Rfl:    acetaminophen  (TYLENOL ) 500 MG tablet, Take by mouth every 6 (six) hours if needed for mild pain, Disp: , Rfl:    amLODIPine  (NORVASC ) 2.5 MG tablet, Take 2.5 mg by mouth 1 (one) time each day, Disp: , Rfl:    ascorbic acid  (VITAMIN C ) 1000 MG tablet, Take 1,000 mg by mouth 1 (one) time each day, Disp: , Rfl:    cetirizine  (ZyrTEC ) 5 MG tablet, , Disp: , Rfl:    Cholecalciferol (Vitamin D ) 25 MCG (1000 UT) tablet, Take by mouth, Disp: , Rfl:    Cyanocobalamin  ER 1000 MCG tablet controlled-release, Take 1 tablet by mouth, Disp: , Rfl:    donepezil  (ARICEPT ) 5 MG tablet, , Disp: , Rfl:    enalapril   (VASOTEC ) 10 MG tablet, Take 10 mg by mouth 1 (one) time each day, Disp: , Rfl:    gabapentin  (NEURONTIN ) 100 MG capsule, , Disp: , Rfl:    Grape Seed Extract 50 MG capsule, Take 1,300 mg by mouth, Disp: , Rfl:    montelukast  (SINGULAIR ) 10 MG tablet, Take 1 tablet by mouth every night, Disp: , Rfl:    tamsulosin  (FLOMAX ) 0.4 MG 24 hr capsule, Take 0.8 mg by mouth, Disp: , Rfl:    warfarin (COUMADIN ) 2.5 MG tablet, Take 1 tab (2.5 mg) 1 day a week, in addition to 5 mg tab for a total of 7.5 mg ONCE A WEEK., Disp: , Rfl:    zinc  gluconate 50 MG tablet, Take 50 mg by mouth 1 (one) time each day, Disp: , Rfl:    Allergies Patient has no known allergies.  Problem List Patient Active Problem List  Diagnosis   Chronic kidney disease, Stage III (moderate)   Hypertension   Secondary hyperparathyroidism of renal origin (HCC)     Review of Systems  Constitutional:  Negative for chills and fever.  Respiratory:  Negative for cough and shortness of breath.   Cardiovascular:  Negative for chest pain, palpitations and leg swelling.  Gastrointestinal:  Negative for nausea and vomiting.  Genitourinary:  Negative for dysuria, hematuria and urgency.     History Past Medical History:  Diagnosis Date   Chronic kidney disease, Stage III (moderate) 09/24/2019   Deep vein thrombosis  of left lower extremity (HCC)    Hypertension 09/24/2019   Pulmonary embolism (HCC)    Secondary hyperparathyroidism of renal origin (HCC) 09/24/2019    History reviewed. No pertinent surgical history. History reviewed. No pertinent family history. Social History   Tobacco Use   Smoking status: Never   Smokeless tobacco: Never  Substance Use Topics   Alcohol use: No        Physical Exam  Vitals BP 122/74 (BP Location: Right upper arm, Patient Position: Sitting)   Pulse 64   Temp 98.3 F   Wt 170 lb (77.1 kg)   SpO2 94%   BMI 27.44 kg/m   PHYSICAL EXAM: General appearance: well  developed, well nourished, NAD Eyes: anicteric sclerae, moist conjunctivae; right and left lower eyelid eversion with erythema HENT: Atraumatic; hearing intact Neck: Trachea midline; supple Lungs: CTAB, with normal respiratory effort  CV: S1S2, no murmurs or rubs. Abdomen: Soft, non-tender; bowel sounds present Extremities: Right greater than left lower extremity edema Skin: Warm and dry, normal skin turgor Psych: Appropriate affect, alert and oriented to person, place and time    Laboratory Studies  Chemistry  Lab Units 08/20/24 1403 07/09/24 1422 05/14/24 1127 01/21/24 1404 09/16/23 1400 05/01/23 1318  SODIUM mmol/L 142 139 140 143 142 143  POTASSIUM mmol/L 4.4 4.3 4.2 5.0 4.6 4.9  CHLORIDE mmol/L 110 109 108 109 108 108  CO2 mmol/L 26 22 23 26 28 27   CALCIUM mg/dL 9.4 9.2 89.8 9.8 9.7 9.4  PHOSPHORUS mg/dL 2.7 2.6 2.7 3.1 2.7 2.9  PTH pg/mL 95* 110* 83* 111* 107* 113*  GLUCOSE mg/dL 86 98 884 95 83 84  ALBUMIN g/dL 3.6 3.6 3.8 3.8 3.8 3.7  BUN mg/dL 17 21 26* 21 21 18   CREATININE mg/dL 8.15* 8.01* 7.86* 8.06* 1.81* 1.95*        No lab exists for component: IRON SATURATION, TRANSSATPER  CBC  Lab Units 08/20/24 1403 07/09/24 1422 05/14/24 1127 01/21/24 1404 09/16/23 1400 05/01/23 1318  WBC AUTO Thousand/uL 4.4 6.2 5.6 5.2 4.7 5.8  HEMOGLOBIN g/dL 86.5 85.7 85.1 85.8 85.9 14.1  HEMOGLOBIN URINE   --   --  NEGATIVE  --   --   --   HEMATOCRIT % 40.6 43.2 44.4 41.7 42.0 41.4  MCV fL 101.0* 100.0 98.2 97.9 98.6 96.1  PLATELETS AUTO Thousand/uL 212 216 254 187 212 211    Urine  Lab Units 08/20/24 1403 05/14/24 1127 01/21/24 1404 09/16/23 1400 05/01/23 1318  COLOR U   --  YELLOW  --   --   --   KETONES U MG/DL   --  TRACE*  --   --   --   PROT/CREAT RATIO UR mg/g creat  --  0.754*  754* 0.889*  889*  --   --   ALB MG/G CREAT UR mg/g creat 392*  --   --  398* 410*    Lab Results  Component Value Date   PTH 95 (H) 08/20/2024   CALCIUM 9.4 08/20/2024    PHOS 2.7 08/20/2024     Imaging and Other Studies     Orders Placed This Encounter   CBC and Differential   Renal Function Panel   PTH, Intact   Urine Albumin / Creatinine Ratio   Ambulatory referral to Hematology / Oncology       Impression/Recommendations  Gregg Thomas is a 89 y.o. male with past medical history hypertension, left lower extremity DVT, pulmonary embolism, history of pneumonia, chronic kidney  disease stage III, history skin cancers with removal who returns for management of chronic kidney disease stage III.  1.  Chronic kidney disease stage IIIb/proteinuria.  The patient's chronic kidney disease appears to be relatively stable most recent eGFR 34.  eGFR has been as low as 30 at his primary care physician's office.  Repeat renal parameters today.  2.  Hypercalcemia.  Patient with history of granulomatous disease in the chest and also recently received pamidronate  to treat his hypercalcemia.  Most recent serum calcium was up to 11.4.  We will refer the patient back over to hematology/oncology for consideration of pamidronate .  3.  Hypertension.  B blood pressure 122/74.  Maintain the patient on amlodipine  and enalapril  for renal protection.  4.  Secondary hyperparathyroidism.  Most recent PTH was 95, phosphorus 2.7, calcium 11.4.  As above we will refer the patient over to hematology for consideration of IV pamidronate .  Return in about 4 months (around 04/29/2025).   Munsoor Lateef, MD

## 2024-12-30 ENCOUNTER — Telehealth: Payer: Self-pay | Admitting: *Deleted

## 2024-12-30 ENCOUNTER — Other Ambulatory Visit: Payer: Self-pay | Admitting: *Deleted

## 2024-12-30 ENCOUNTER — Other Ambulatory Visit: Payer: Self-pay | Admitting: Internal Medicine

## 2024-12-30 DIAGNOSIS — I82512 Chronic embolism and thrombosis of left femoral vein: Secondary | ICD-10-CM

## 2024-12-30 NOTE — Telephone Encounter (Signed)
 Patient's wife called triage. Pt saw Dr. Marcelino yesterday and pt's calcium level is up to 11.4. She was instructed that patient needs pamidronate  (Aredia ) infusion again. Pt last had this infusion in the hospital in June per Wife. She stated that she was instructed to call here for another apt with Dr. B for consideration of pamidronate .  Wife would like a call back today if possible regarding this. I told her that I would give the message to the team. She wanted to know if this could be done this week. I shared with her there are many factors that would influence our ability to do that including insurance approval, getting the medication and infusion chair vs outpatient availability for non-oncology r/t infusions. She stated that she understood.   We have only seen the patient here for mgmt of h/o of chronic DVT. Last seen by Dr. KATHEE on 09/07/2024. Patient seeing Dr. Marcelino for stage 3-CKD. Per Lateef's note- Hypercalcemia. Patient with history of granulomatous disease in the chest and also recently received pamidronate  to treat his hypercalcemia.

## 2025-01-01 DIAGNOSIS — E20811 Secondary hypoparathyroidism in diseases classified elsewhere: Secondary | ICD-10-CM | POA: Diagnosis not present

## 2025-01-01 DIAGNOSIS — I129 Hypertensive chronic kidney disease with stage 1 through stage 4 chronic kidney disease, or unspecified chronic kidney disease: Secondary | ICD-10-CM | POA: Diagnosis not present

## 2025-01-01 DIAGNOSIS — Z7951 Long term (current) use of inhaled steroids: Secondary | ICD-10-CM | POA: Diagnosis not present

## 2025-01-01 DIAGNOSIS — F03A0 Unspecified dementia, mild, without behavioral disturbance, psychotic disturbance, mood disturbance, and anxiety: Secondary | ICD-10-CM | POA: Diagnosis not present

## 2025-01-01 DIAGNOSIS — N1832 Chronic kidney disease, stage 3b: Secondary | ICD-10-CM | POA: Diagnosis not present

## 2025-01-01 DIAGNOSIS — Z7901 Long term (current) use of anticoagulants: Secondary | ICD-10-CM | POA: Diagnosis not present

## 2025-01-04 ENCOUNTER — Inpatient Hospital Stay

## 2025-01-08 ENCOUNTER — Encounter: Payer: Self-pay | Admitting: Internal Medicine

## 2025-01-08 ENCOUNTER — Inpatient Hospital Stay: Attending: Internal Medicine

## 2025-01-08 ENCOUNTER — Telehealth

## 2025-01-08 ENCOUNTER — Inpatient Hospital Stay: Admitting: Internal Medicine

## 2025-01-08 ENCOUNTER — Inpatient Hospital Stay

## 2025-01-08 VITALS — BP 128/66 | HR 68 | Temp 98.0°F | Resp 18 | Ht 67.0 in | Wt 169.7 lb

## 2025-01-08 DIAGNOSIS — Z7901 Long term (current) use of anticoagulants: Secondary | ICD-10-CM | POA: Insufficient documentation

## 2025-01-08 DIAGNOSIS — M791 Myalgia, unspecified site: Secondary | ICD-10-CM | POA: Insufficient documentation

## 2025-01-08 DIAGNOSIS — Z85820 Personal history of malignant melanoma of skin: Secondary | ICD-10-CM | POA: Insufficient documentation

## 2025-01-08 DIAGNOSIS — R5383 Other fatigue: Secondary | ICD-10-CM | POA: Insufficient documentation

## 2025-01-08 DIAGNOSIS — Z823 Family history of stroke: Secondary | ICD-10-CM | POA: Diagnosis not present

## 2025-01-08 DIAGNOSIS — I82512 Chronic embolism and thrombosis of left femoral vein: Secondary | ICD-10-CM | POA: Insufficient documentation

## 2025-01-08 DIAGNOSIS — Z79899 Other long term (current) drug therapy: Secondary | ICD-10-CM | POA: Diagnosis not present

## 2025-01-08 DIAGNOSIS — Z85828 Personal history of other malignant neoplasm of skin: Secondary | ICD-10-CM | POA: Diagnosis not present

## 2025-01-08 DIAGNOSIS — Z820 Family history of epilepsy and other diseases of the nervous system: Secondary | ICD-10-CM | POA: Insufficient documentation

## 2025-01-08 DIAGNOSIS — F32A Depression, unspecified: Secondary | ICD-10-CM | POA: Diagnosis not present

## 2025-01-08 DIAGNOSIS — Z86711 Personal history of pulmonary embolism: Secondary | ICD-10-CM | POA: Insufficient documentation

## 2025-01-08 DIAGNOSIS — M2550 Pain in unspecified joint: Secondary | ICD-10-CM | POA: Insufficient documentation

## 2025-01-08 DIAGNOSIS — R413 Other amnesia: Secondary | ICD-10-CM | POA: Diagnosis not present

## 2025-01-08 DIAGNOSIS — N184 Chronic kidney disease, stage 4 (severe): Secondary | ICD-10-CM | POA: Diagnosis not present

## 2025-01-08 LAB — CBC WITH DIFFERENTIAL (CANCER CENTER ONLY)
Abs Immature Granulocytes: 0.01 K/uL (ref 0.00–0.07)
Basophils Absolute: 0 K/uL (ref 0.0–0.1)
Basophils Relative: 1 %
Eosinophils Absolute: 0.2 K/uL (ref 0.0–0.5)
Eosinophils Relative: 5 %
HCT: 40.2 % (ref 39.0–52.0)
Hemoglobin: 13.5 g/dL (ref 13.0–17.0)
Immature Granulocytes: 0 %
Lymphocytes Relative: 27 %
Lymphs Abs: 1.3 K/uL (ref 0.7–4.0)
MCH: 33.2 pg (ref 26.0–34.0)
MCHC: 33.6 g/dL (ref 30.0–36.0)
MCV: 98.8 fL (ref 80.0–100.0)
Monocytes Absolute: 0.5 K/uL (ref 0.1–1.0)
Monocytes Relative: 11 %
Neutro Abs: 2.6 K/uL (ref 1.7–7.7)
Neutrophils Relative %: 56 %
Platelet Count: 166 K/uL (ref 150–400)
RBC: 4.07 MIL/uL — ABNORMAL LOW (ref 4.22–5.81)
RDW: 13.8 % (ref 11.5–15.5)
WBC Count: 4.7 K/uL (ref 4.0–10.5)
nRBC: 0 % (ref 0.0–0.2)

## 2025-01-08 LAB — BASIC METABOLIC PANEL - CANCER CENTER ONLY
Anion gap: 9 (ref 5–15)
BUN: 24 mg/dL — ABNORMAL HIGH (ref 8–23)
CO2: 23 mmol/L (ref 22–32)
Calcium: 10.5 mg/dL — ABNORMAL HIGH (ref 8.9–10.3)
Chloride: 110 mmol/L (ref 98–111)
Creatinine: 2.07 mg/dL — ABNORMAL HIGH (ref 0.61–1.24)
GFR, Estimated: 30 mL/min — ABNORMAL LOW
Glucose, Bld: 90 mg/dL (ref 70–99)
Potassium: 4.4 mmol/L (ref 3.5–5.1)
Sodium: 142 mmol/L (ref 135–145)

## 2025-01-08 LAB — PROTIME-INR
INR: 2.4 — ABNORMAL HIGH (ref 0.8–1.2)
Prothrombin Time: 27.1 s — ABNORMAL HIGH (ref 11.4–15.2)

## 2025-01-08 MED ORDER — DENOSUMAB 120 MG/1.7ML ~~LOC~~ SOLN
120.0000 mg | Freq: Once | SUBCUTANEOUS | Status: AC
  Administered 2025-01-08: 120 mg via SUBCUTANEOUS
  Filled 2025-01-08: qty 1.7

## 2025-01-08 NOTE — Progress Notes (Signed)
 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  Accompanied by his wife.  Gregg Thomas 89 y.o.  male history of chronic left lower extremity DVT on Coumadin  and history of CKD-III-IV; on erivedge intermittently for [facility] is here for follow-up.   Discussed the use of AI scribe software for clinical note transcription with the patient, who gave verbal consent to proceed.  History of Present Illness   Gregg Thomas is a 89 year old male with chronic hypercalcemia and stage 4 chronic kidney disease who presents for re-evaluation and management of persistent hypercalcemia.  He has had persistent hypercalcemia for several months, with serum calcium previously peaking at 12 mg/dL during a hospitalization last summer, subsequently decreasing to 11 mg/dL and most recently to 89.4 mg/dL, which remains mildly elevated above the normal upper limit of 10.3 mg/dL. He previously received pamidronate  infusion during his hospitalization.  He is currently on Coumadin .       Review of Systems  Constitutional:  Positive for malaise/fatigue. Negative for chills, diaphoresis,  fever and weight loss.  HENT:  Negative for nosebleeds and sore throat.   Eyes:  Negative for double vision.  Respiratory:  Negative for cough, hemoptysis, sputum production, shortness of breath and wheezing.   Cardiovascular:  Negative for chest pain, palpitations, orthopnea and leg swelling.  Gastrointestinal:  Negative for abdominal pain, blood in stool, constipation, diarrhea, heartburn, melena, nausea and vomiting.  Genitourinary:  Negative for dysuria, frequency and urgency.  Musculoskeletal:  Positive for joint pain and myalgias. Negative for back pain.  Skin: Negative.  Negative for itching and rash.  Neurological:  Negative for dizziness, tingling, focal weakness, weakness and headaches.  Endo/Heme/Allergies:  Bruises/bleeds easily.  Psychiatric/Behavioral:  Positive for memory loss. Negative for depression. The patient is not nervous/anxious and does not have insomnia.      MEDICAL HISTORY:  Past Medical History:  Diagnosis Date   Chronic kidney disease    Clotting disorder    Dementia (HCC)    Heart murmur    Skin cancer     SURGICAL HISTORY: Past Surgical History:  Procedure Laterality Date   biopsy Right 08/26/2019   temple biopsy - dr.dasher    EYE SURGERY Right    benign growth on right eye    KNEE SURGERY Right    MELANOMA EXCISION Bilateral    lower back     SOCIAL HISTORY: Social History   Socioeconomic History   Marital status: Married    Spouse name: Not on file   Number of children: Not on file   Years of education: 13 years    Highest education level: Some college, no degree  Occupational History   Occupation: retired  Tobacco Use   Smoking status: Never   Smokeless tobacco: Never  Vaping Use   Vaping status:  Never Used  Substance and Sexual Activity   Alcohol use: No   Drug use: No   Sexual activity: Not Currently  Other Topics Concern   Not on file  Social History Narrative   [employer]    [facility]        Golfing 2 times a week, alk 3 times a week    Social Drivers of Health   Tobacco Use: Low Risk (01/08/2025)   Patient History    Smoking Tobacco Use: Never    Smokeless Tobacco Use: Never    Passive Exposure: Not on file  Financial Resource Strain: Low Risk  (12/03/2024)   Received from [facility]   Overall Financial Resource Strain (CARDIA)    Difficulty of Paying Living Expenses: Not hard at all  Food Insecurity: No Food Insecurity (12/03/2024)   Received from [facility]   Epic    Within the past 12 months, you worried that your food would run out before you got the money to buy more.: Never true    Within the past 12 months, the food you bought just didn't last and you didn't have money to get more.: Never true  Transportation Needs: No Transportation Needs (12/03/2024)   Received from [facility] - Transportation    In the past 12 months, has lack of transportation kept you from medical appointments or from getting medications?: No    Lack of Transportation (Non-Medical): No  Physical Activity: Insufficiently Active (09/24/2022)   Exercise Vital Sign    Days of Exercise per Week: 2 days    Minutes of Exercise per Session: 20 min  Stress: No Stress Concern Present (09/24/2022)   [employer] - Occupational Stress Questionnaire    Feeling of Stress : Not at all  Social Connections: Moderately Integrated (06/30/2024)   Social Connection and Isolation Panel    Frequency of Communication with Friends and Family: More than three times a week    Frequency of Social Gatherings with Friends and Family: More than three times a week    Attends Religious Services: More than 4 times per year    Active Member of Clubs or Organizations: No    Attends Banker Meetings: Never    Marital Status: Married  Catering Manager Violence: Not At Risk (07/09/2024)   Epic    Fear of Current or  Ex-Partner: No    Emotionally Abused: No    Physically Abused: No    Sexually Abused: No  Depression (PHQ2-9): High Risk (10/12/2024)   Depression (PHQ2-9)    PHQ-2 Score: 11  Alcohol Screen: Low Risk (05/12/2024)   Alcohol Screen    Last Alcohol Screening Score (AUDIT): 0  Housing: Low Risk  (12/03/2024)   Received from [facility]   Epic    In the last 12 months, was there a time when you were not able to pay the mortgage or rent on time?: No    In the past 12 months, how many times have you moved where you were living?: 0    At any time in the past 12 months, were you homeless or living in a shelter (including now)?: No  Utilities: Not At Risk (12/03/2024)   Received from [facility]   Epic    In the past 12 months has the electric, gas, oil, or water company threatened to shut off services in your home?: No  Health Literacy: Inadequate Health Literacy (05/12/2024)   B1300 Health Literacy    Frequency of need for help with medical instructions: Always    FAMILY HISTORY: Family History  Problem Relation Age of Onset   Stroke Mother    Cerebral aneurysm Father     ALLERGIES:  has no known allergies.  MEDICATIONS:  Current Outpatient Medications  Medication Sig Dispense Refill   albuterol  (VENTOLIN  HFA) 108 (90 Base) MCG/ACT inhaler Inhale 2 puffs into the lungs every 6 (six) hours as needed for wheezing or shortness of breath. 8 g 1   amLODipine  (NORVASC ) 2.5 MG tablet Take 1 tablet (2.5 mg total) by mouth daily. 90 tablet 1   ascorbic acid  (VITAMIN C ) 1000 MG tablet Take by mouth.     cetirizine  (ZYRTEC ) 5 MG tablet Take 1 tablet (5 mg total) by mouth daily. 90 tablet 3   Cyanocobalamin  (VITAMIN B-12 ER PO) Take 1,000 mcg by mouth daily at 6 (six) AM.     donepezil  (ARICEPT ) 10 MG tablet Take 1 tablet (10 mg total) by mouth at bedtime. 90 tablet 1   ELDERBERRY PO Take 1 tablet by mouth daily.     fluticasone  (FLONASE ) 50 MCG/ACT nasal  spray Place 2 sprays into both nostrils daily. 16 g 6   gabapentin  (NEURONTIN ) 100 MG capsule Take 2 capsules (200 mg total) by mouth at bedtime. (Patient taking differently: Take 100 mg by mouth at bedtime.) 180 capsule 1   Grape Seed Extract 50 MG CAPS Take 1,300 mg by mouth daily.      Ipratropium-Albuterol  (COMBIVENT ) 20-100 MCG/ACT AERS respimat Inhale 1 puff into the lungs every 6 (six) hours.     montelukast  (SINGULAIR ) 10 MG tablet Take 1 tablet (10 mg total) by mouth at bedtime. 90 tablet 1   tamsulosin  (FLOMAX ) 0.4 MG CAPS capsule Take 2 capsules (0.8 mg total) by mouth daily after supper. 180 capsule 1   warfarin (COUMADIN ) 5 MG tablet Take 1 tablet (5 mg total) by mouth daily. 90 tablet 1   Current Facility-Administered Medications  Medication Dose Route Frequency Provider Last Rate Last Admin   cyanocobalamin  (VITAMIN B12) injection 1,000 mcg  1,000 mcg Intramuscular Q30 days Vicci, Megan P, DO   1,000 mcg at 11/09/24 1454      .  PHYSICAL EXAMINATION: ECOG PERFORMANCE STATUS: 0 - Asymptomatic  Vitals:   01/08/25 1322  BP: 128/66  Pulse: 68  Resp: 18  Temp: 98 F (36.7 C)  SpO2: 96%     Filed Weights   01/08/25 1322  Weight: 169 lb 11.2 oz (77 kg)    Physical Exam Constitutional:      Comments: Elderly appearing Caucasian male patient accompanied by his wife.  Is walking himself.  HENT:     Head: Normocephalic and atraumatic.     Mouth/Throat:     Pharynx: No oropharyngeal exudate.  Eyes:     Pupils: Pupils are equal, round, and reactive to light.  Cardiovascular:     Rate and Rhythm: Normal rate and regular rhythm.  Pulmonary:     Effort: Pulmonary effort is normal. No respiratory distress.     Breath sounds: Normal breath sounds. No wheezing.  Abdominal:     General: Bowel sounds are normal. There is no distension.     Palpations: Abdomen is soft. There is no mass.     Tenderness: There is no abdominal tenderness. There is no guarding or rebound.   Musculoskeletal:  General: No tenderness. Normal range of motion.     Cervical back: Normal range of motion and neck supple.  Skin:    General: Skin is warm.  Neurological:     Mental Status: He is alert and oriented to person, place, and time.  Psychiatric:        Mood and Affect: Affect normal.      LABORATORY DATA:  I have reviewed the data as listed Lab Results  Component Value Date   WBC 4.7 01/08/2025   HGB 13.5 01/08/2025   HCT 40.2 01/08/2025   MCV 98.8 01/08/2025   PLT 166 01/08/2025   Recent Labs    05/12/24 1400 06/10/24 1412 06/19/24 1610 07/01/24 0404 09/04/24 1313 10/12/24 1458 11/09/24 1445 01/08/25 1317  NA 143 138   < > 139 139 143 142 142  K 4.4 5.7*   < > 4.2 4.0 5.2 4.7 4.4  CL 107* 106   < > 111 110 109* 107* 110  CO2 23 22   < > 22 21* 20 24 23   GLUCOSE 92 115*   < > 82 97 77 93 90  BUN 24 26   < > 29* 22 22 24  24*  CREATININE 1.99* 2.18*   < > 1.89* 2.01* 1.90* 2.04* 2.07*  CALCIUM 9.9 12.8*   < > 10.6* 9.1 11.0* 11.4* 10.5*  GFRNONAA  --   --    < > 33* 31*  --   --  30*  PROT 6.9 7.3  --   --   --   --   --   --   ALBUMIN 3.8 3.8  --   --   --   --   --   --   AST 48* 45*  --   --   --   --   --   --   ALT 35 42  --   --   --   --   --   --   ALKPHOS 151* 140*  --   --   --   --   --   --   BILITOT 0.5 0.6  --   --   --   --   --   --    < > = values in this interval not displayed.    RADIOGRAPHIC STUDIES: I have personally reviewed the radiological images as listed and agreed with the findings in the report. No results found.  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 on Coumadin /warfarin- on 5 mg a day- [no major fluctuations noted on hedgehog inhibitor].  INR from today-2.8. continue currentt dose at 5 mg/day-  Stable-  #CKD-III [GFR 30s]Hx of hypercalciemia [possible granulomatous  etiology].- Dr.Lateef;-calcium in between 10-11.  Recommend proceeding with Xgeva .  Discussed the potential side effects including but not limited to hypocalcemia; and also rare risk of ONJ.  Patient has dentures.  Recommend dental evaluation for further injections-as this might be an ongoing problem.  Discussed with Dr. Joni.  Patient awaiting labs next week with PCP.  Inform PCP requesting an update if calcium continues to run high  # Basal cell carcinoma: on Hedgehog inhibitor intermittent [Dr.Dasher]- stable.   # Fatigue/ muscle cramps-suspect from intermittent vismodegib. On intermittent PT   # DISPOSITION: # x-geva today # PT/INR every month x 6  # in 3 months- MD; cbc/bmp/Pt/INR;  possible  x-geva--Dr.B  Cc;     All questions were answered. The patient knows to call the clinic with any problems, questions or concerns.    Cindy JONELLE Joe, MD 01/08/2025 2:50 PM

## 2025-01-08 NOTE — Assessment & Plan Note (Addendum)
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 on Coumadin /warfarin- on 5 mg a day- [no major fluctuations noted on hedgehog inhibitor].  INR from today-2.8. continue currentt dose at 5 mg/day-  Stable-  #CKD-III [GFR 30s]Hx of hypercalciemia [possible granulomatous etiology].- Dr.Lateef;-calcium in between 10-11.  Recommend proceeding with Xgeva .  Discussed the potential side effects including but not limited to hypocalcemia; and also rare risk of ONJ.  Patient has dentures.  Recommend dental evaluation for further injections-as this might be an ongoing problem.  Discussed with Dr. Joni.  Patient awaiting labs next week with PCP.  Inform PCP requesting an update if calcium continues to run high  # Basal cell carcinoma: on Hedgehog inhibitor intermittent [Dr.Dasher]- stable.   # Fatigue/ muscle cramps-suspect from intermittent vismodegib. On intermittent PT   # DISPOSITION: # x-geva today # PT/INR every month x 6  # in 3 months- MD; cbc/bmp/Pt/INR; possible  x-geva--Dr.B  Cc;

## 2025-01-08 NOTE — Progress Notes (Signed)
 Patient has no concerns

## 2025-01-12 ENCOUNTER — Encounter: Payer: Self-pay | Admitting: Family Medicine

## 2025-01-12 ENCOUNTER — Ambulatory Visit: Admitting: Family Medicine

## 2025-01-12 ENCOUNTER — Ambulatory Visit

## 2025-01-12 DIAGNOSIS — F03A0 Unspecified dementia, mild, without behavioral disturbance, psychotic disturbance, mood disturbance, and anxiety: Secondary | ICD-10-CM

## 2025-01-12 DIAGNOSIS — N1832 Chronic kidney disease, stage 3b: Secondary | ICD-10-CM | POA: Diagnosis not present

## 2025-01-12 DIAGNOSIS — I10 Essential (primary) hypertension: Secondary | ICD-10-CM

## 2025-01-12 DIAGNOSIS — E538 Deficiency of other specified B group vitamins: Secondary | ICD-10-CM | POA: Diagnosis not present

## 2025-01-12 MED ORDER — TAMSULOSIN HCL 0.4 MG PO CAPS: 0.8000 mg | ORAL_CAPSULE | Freq: Every day | ORAL | 1 refills | Status: AC

## 2025-01-12 MED ORDER — FLUTICASONE PROPIONATE 50 MCG/ACT NA SUSP: 2.0000 | Freq: Every day | NASAL | 6 refills | Status: AC

## 2025-01-12 MED ORDER — ALBUTEROL SULFATE HFA 108 (90 BASE) MCG/ACT IN AERS: 2.0000 | INHALATION_SPRAY | Freq: Four times a day (QID) | RESPIRATORY_TRACT | 1 refills | Status: AC | PRN

## 2025-01-12 MED ORDER — MONTELUKAST SODIUM 10 MG PO TABS: 10.0000 mg | ORAL_TABLET | Freq: Every day | ORAL | 1 refills | Status: AC

## 2025-01-12 MED ORDER — GABAPENTIN 100 MG PO CAPS: 100.0000 mg | ORAL_CAPSULE | Freq: Every day | ORAL | 1 refills | Status: AC

## 2025-01-12 MED ORDER — AMLODIPINE BESYLATE 2.5 MG PO TABS: 2.5000 mg | ORAL_TABLET | Freq: Every day | ORAL | 1 refills | Status: AC

## 2025-01-12 NOTE — Assessment & Plan Note (Signed)
 Rechecking labs today. Await results. Treat as needed.

## 2025-01-12 NOTE — Assessment & Plan Note (Signed)
 Continue b12 shots. Call with any concerns.

## 2025-01-12 NOTE — Assessment & Plan Note (Signed)
 Continue current regimen. Continue to follow with neurology. Call with any concerns.

## 2025-01-12 NOTE — Assessment & Plan Note (Signed)
 Had x-geva shot last week. Rechecking calcium today. Await results. Treat as needed.

## 2025-01-12 NOTE — Assessment & Plan Note (Signed)
Better on recheck. Continue current regimen. Continue to monitor. Call with any concerns.  

## 2025-01-12 NOTE — Progress Notes (Signed)
 "  BP 132/62   Pulse 61   Temp (!) 97.1 F (36.2 C) (Oral)   Ht 5' 7 (1.702 m)   Wt 171 lb 3.2 oz (77.7 kg)   SpO2 96%   BMI 26.81 kg/m    Subjective:    Patient ID: Gregg Thomas, male    DOB: 09/14/1931, 89 y.o.   MRN: [id number]  HPI: Gregg Thomas is a 89 y.o. male  Chief Complaint  Patient presents with   Fatigue    Pt is feeling much better   Hypertension   Cerumen Impaction   Saw neurology for memory loss since his last visit. No changes- just lifestyle recommendations.  Saw nephrology about 2 weeks ago. They got him into hematology for the hypercalcemia which they thought may be related to granulomatous disease.  Saw hematology last week- concerned about hypercalcemia. They gave him an injection of x-geva and want us  to recheck on his calcium today. He notes that he is doing better!   HYPERTENSION  Hypertension status: uncontrolled  Satisfied with current treatment? yes Duration of hypertension: chronic BP monitoring frequency:  a few times a month BP medication side effects:  no Medication compliance: excellent compliance Previous BP meds: amlodipine  Aspirin: no Recurrent headaches: no Visual changes: no Palpitations: no Dyspnea: no Chest pain: no Lower extremity edema: no Dizzy/lightheaded: no   Relevant past medical, surgical, family and social history reviewed and updated as indicated. Interim medical history since our last visit reviewed. Allergies and medications reviewed and updated.  Review of Systems  Constitutional: Negative.   Respiratory: Negative.    Cardiovascular: Negative.   Musculoskeletal: Negative.   Neurological: Negative.   Psychiatric/Behavioral: Negative.      Per HPI unless specifically indicated above     Objective:    BP 132/62   Pulse 61   Temp (!) 97.1 F (36.2 C) (Oral)   Ht 5' 7 (1.702 m)   Wt 171 lb 3.2 oz (77.7 kg)   SpO2 96%   BMI 26.81 kg/m   Wt Readings from Last 3 Encounters:   01/12/25 171 lb 3.2 oz (77.7 kg)  01/08/25 169 lb 11.2 oz (77 kg)  10/12/24 169 lb 2 oz (76.7 kg)    Physical Exam Vitals and nursing note reviewed.  Constitutional:      General: He is not in acute distress.    Appearance: Normal appearance. He is not ill-appearing, toxic-appearing or diaphoretic.  HENT:     Head: Normocephalic and atraumatic.     Right Ear: External ear normal. There is no impacted cerumen.     Left Ear: External ear normal. There is no impacted cerumen.     Nose: Nose normal.     Mouth/Throat:     Mouth: Mucous membranes are moist.     Pharynx: Oropharynx is clear.  Eyes:     General: No scleral icterus.       Right eye: No discharge.        Left eye: No discharge.     Extraocular Movements: Extraocular movements intact.     Conjunctiva/sclera: Conjunctivae normal.     Pupils: Pupils are equal, round, and reactive to light.  Cardiovascular:     Rate and Rhythm: Normal rate and regular rhythm.     Pulses: Normal pulses.     Heart sounds: Normal heart sounds. No murmur heard.    No friction rub. No gallop.  Pulmonary:     Effort: Pulmonary effort is normal. No respiratory  distress.     Breath sounds: Normal breath sounds. No stridor. No wheezing, rhonchi or rales.  Chest:     Chest wall: No tenderness.  Musculoskeletal:        General: Normal range of motion.     Cervical back: Normal range of motion and neck supple.  Skin:    General: Skin is warm and dry.     Capillary Refill: Capillary refill takes less than 2 seconds.     Coloration: Skin is not jaundiced or pale.     Findings: No bruising, erythema, lesion or rash.  Neurological:     General: No focal deficit present.     Mental Status: He is alert and oriented to person, place, and time. Mental status is at baseline.  Psychiatric:        Mood and Affect: Mood normal.        Behavior: Behavior normal.        Thought Content: Thought content normal.        Judgment: Judgment normal.      Results for orders placed or performed in visit on 01/08/25  Protime-INR   Collection Time: 01/08/25  1:16 PM  Result Value Ref Range   Prothrombin Time 27.1 (H) 11.4 - 15.2 seconds   INR 2.4 (H) 0.8 - 1.2  CBC with Differential (Cancer Center Only)   Collection Time: 01/08/25  1:16 PM  Result Value Ref Range   WBC Count 4.7 4.0 - 10.5 K/uL   RBC 4.07 (L) 4.22 - 5.81 MIL/uL   Hemoglobin 13.5 13.0 - 17.0 g/dL   HCT 59.7 60.9 - 47.9 %   MCV 98.8 80.0 - 100.0 fL   MCH 33.2 26.0 - 34.0 pg   MCHC 33.6 30.0 - 36.0 g/dL   RDW 86.1 88.4 - 84.4 %   Platelet Count 166 150 - 400 K/uL   nRBC 0.0 0.0 - 0.2 %   Neutrophils Relative % 56 %   Neutro Abs 2.6 1.7 - 7.7 K/uL   Lymphocytes Relative 27 %   Lymphs Abs 1.3 0.7 - 4.0 K/uL   Monocytes Relative 11 %   Monocytes Absolute 0.5 0.1 - 1.0 K/uL   Eosinophils Relative 5 %   Eosinophils Absolute 0.2 0.0 - 0.5 K/uL   Basophils Relative 1 %   Basophils Absolute 0.0 0.0 - 0.1 K/uL   Immature Granulocytes 0 %   Abs Immature Granulocytes 0.01 0.00 - 0.07 K/uL  Basic Metabolic Panel - Cancer Center Only   Collection Time: 01/08/25  1:17 PM  Result Value Ref Range   Sodium 142 135 - 145 mmol/L   Potassium 4.4 3.5 - 5.1 mmol/L   Chloride 110 98 - 111 mmol/L   CO2 23 22 - 32 mmol/L   Glucose, Bld 90 70 - 99 mg/dL   BUN 24 (H) 8 - 23 mg/dL   Creatinine 7.92 (H) 9.38 - 1.24 mg/dL   Calcium 89.4 (H) 8.9 - 10.3 mg/dL   GFR, Estimated 30 (L) >60 mL/min   Anion gap 9 5 - 15      Assessment & Plan:   Problem List Items Addressed This Visit       Cardiovascular and Mediastinum   HTN (hypertension)   Better on recheck. Continue current regimen. Continue to monitor. Call with any concerns.       Relevant Medications   amLODipine  (NORVASC ) 2.5 MG tablet     Nervous and Auditory   Dementia (HCC)   Continue current  regimen. Continue to follow with neurology. Call with any concerns.       Relevant Medications   gabapentin  (NEURONTIN )  100 MG capsule     Genitourinary   CKD stage 3b, GFR 30-44 ml/min (HCC)   Rechecking labs today. Await results. Treat as needed.         Other   B12 deficiency   Continue b12 shots. Call with any concerns.       Hypercalcemia - Primary   Had x-geva shot last week. Rechecking calcium today. Await results. Treat as needed.       Relevant Orders   Basic metabolic panel with GFR     Follow up plan: Return for monthly nurse visits for B12 shots, 3 months with me.  >30 minutes spent with patient, wife and daughter today    "

## 2025-01-13 ENCOUNTER — Other Ambulatory Visit

## 2025-01-13 ENCOUNTER — Other Ambulatory Visit: Payer: Self-pay

## 2025-01-13 ENCOUNTER — Ambulatory Visit: Payer: Self-pay | Admitting: Family Medicine

## 2025-01-13 DIAGNOSIS — I82512 Chronic embolism and thrombosis of left femoral vein: Secondary | ICD-10-CM

## 2025-01-13 LAB — BASIC METABOLIC PANEL WITH GFR
BUN/Creatinine Ratio: 11 (ref 10–24)
BUN: 22 mg/dL (ref 10–36)
CO2: 20 mmol/L (ref 20–29)
Calcium: 9.6 mg/dL (ref 8.6–10.2)
Chloride: 109 mmol/L — ABNORMAL HIGH (ref 96–106)
Creatinine, Ser: 2.06 mg/dL — ABNORMAL HIGH (ref 0.76–1.27)
Glucose: 90 mg/dL (ref 70–99)
Potassium: 4.7 mmol/L (ref 3.5–5.2)
Sodium: 142 mmol/L (ref 134–144)
eGFR: 30 mL/min/1.73 — ABNORMAL LOW

## 2025-01-13 NOTE — Progress Notes (Signed)
 BMP added standing, monthly.

## 2025-01-15 NOTE — Patient Instructions (Signed)
 Visit Information  Thank you for taking time to visit with me today. Please don't hesitate to contact me if I can be of assistance to you before our next scheduled appointment.  Your next care management appointment is by telephone on Tuesday, February 10th at 1:00pm.   Please call the care guide team at [phone] if you need to cancel, schedule, or reschedule an appointment.   Please call the [place]  National Suicide Prevention Lifeline: 9017029566 or TTY: 337-624-7150 TTY [phone]) to talk to a trained counselor if you are experiencing a Mental Health or Behavioral Health Crisis or need someone to talk to.  Santana Stamp BSN, CCM [place]  VBCI Population Health RN Care Manager Direct Dial: [phone]  Fax: [phone]

## 2025-01-15 NOTE — Patient Outreach (Signed)
 Complex Care Management   Visit Note  01/15/2025  Name:  Gregg Thomas MRN: [id number] DOB: 18-Jun-1931  Situation: Referral received for Complex Care Management related to Chronic Kidney Disease and Impaired Mobility. I obtained verbal consent from Caregiver.  Visit completed with wife/DPR, Shawnee Fabian  on the phone.  Wife continues to be main caregiver to patient, she manages his appointments/medications/transportation.  She denies he has had any falls since 10/06/24.   Background:   Past Medical History:  Diagnosis Date   Chronic kidney disease    Clotting disorder    Dementia (HCC)    Heart murmur    Skin cancer     Assessment: Patient Reported Symptoms:  Cognitive Cognitive Status: Able to follow simple commands, Struggling with memory recall, Requires Assistance Decision Making      Neurological Neurological Review of Symptoms: No symptoms reported    HEENT HEENT Symptoms Reported: Other: HEENT Comment: Needs updated eye exam and dental exam (due to Xgeva  injections to lower calcium), wife will make these appts.    Cardiovascular Cardiovascular Symptoms Reported: Not assessed    Respiratory Respiratory Symptoms Reported: No symptoms reported    Endocrine Endocrine Symptoms Reported: Not assessed Is patient diabetic?: No    Gastrointestinal Gastrointestinal Symptoms Reported: Not assessed      Genitourinary Genitourinary Symptoms Reported: Not assessed    Integumentary Integumentary Symptoms Reported: Not assessed    Musculoskeletal Musculoskelatal Symptoms Reviewed: Unsteady gait Additional Musculoskeletal Details: Denies falls since 10/06/24.        Psychosocial Psychosocial Symptoms Reported: No symptoms reported          01/15/2025    PHQ2-9 Depression Screening   Little interest or pleasure in doing things    Feeling down, depressed, or hopeless    PHQ-2 - Total Score    Trouble falling or staying asleep, or sleeping too much    Feeling  tired or having little energy    Poor appetite or overeating     Feeling bad about yourself - or that you are a failure or have let yourself or your family down    Trouble concentrating on things, such as reading the newspaper or watching television    Moving or speaking so slowly that other people could have noticed.  Or the opposite - being so fidgety or restless that you have been moving around a lot more than usual    Thoughts that you would be better off dead, or hurting yourself in some way    PHQ2-9 Total Score    If you checked off any problems, how difficult have these problems made it for you to do your work, take care of things at home, or get along with other people    Depression Interventions/Treatment      There were no vitals filed for this visit.    Medications Reviewed Today   Medications were not reviewed in this encounter     Recommendation:   Continue Current Plan of Care  Follow Up Plan:   Telephone follow-up in 1 month  Santana Stamp BSN, CCM [place]  [facility] Population Health RN Care Manager Direct Dial: [phone]  Fax: [phone]

## 2025-02-04 ENCOUNTER — Ambulatory Visit

## 2025-02-08 ENCOUNTER — Inpatient Hospital Stay: Attending: Internal Medicine

## 2025-02-09 ENCOUNTER — Telehealth

## 2025-02-12 ENCOUNTER — Ambulatory Visit

## 2025-03-03 ENCOUNTER — Ambulatory Visit: Admitting: Internal Medicine

## 2025-03-03 ENCOUNTER — Other Ambulatory Visit

## 2025-03-08 ENCOUNTER — Inpatient Hospital Stay

## 2025-03-12 ENCOUNTER — Ambulatory Visit

## 2025-03-18 ENCOUNTER — Ambulatory Visit

## 2025-04-09 ENCOUNTER — Inpatient Hospital Stay

## 2025-04-09 ENCOUNTER — Inpatient Hospital Stay: Admitting: Internal Medicine

## 2025-04-12 ENCOUNTER — Ambulatory Visit: Admitting: Family Medicine

## 2025-04-12 ENCOUNTER — Ambulatory Visit

## 2025-05-10 ENCOUNTER — Inpatient Hospital Stay

## 2025-06-10 ENCOUNTER — Inpatient Hospital Stay

## 2025-07-09 ENCOUNTER — Inpatient Hospital Stay
